# Patient Record
Sex: Male | Born: 1975
Health system: Southern US, Community
[De-identification: ages and names within clinical notes are randomized; demographics above are authoritative.]

## PROBLEM LIST (undated history)

## (undated) ENCOUNTER — Emergency Department (HOSPITAL_COMMUNITY): Payer: Medicare HMO

## (undated) ENCOUNTER — Emergency Department (HOSPITAL_COMMUNITY): Discharge status: Absent without leave | Payer: 59

## (undated) DIAGNOSIS — J449 Chronic obstructive pulmonary disease, unspecified: Secondary | ICD-10-CM

## (undated) DIAGNOSIS — K259 Gastric ulcer, unspecified as acute or chronic, without hemorrhage or perforation: Secondary | ICD-10-CM

## (undated) DIAGNOSIS — G4730 Sleep apnea, unspecified: Secondary | ICD-10-CM

## (undated) DIAGNOSIS — D649 Anemia, unspecified: Secondary | ICD-10-CM

## (undated) DIAGNOSIS — E669 Obesity, unspecified: Secondary | ICD-10-CM

## (undated) DIAGNOSIS — L0231 Cutaneous abscess of buttock: Secondary | ICD-10-CM

## (undated) DIAGNOSIS — U071 COVID-19: Secondary | ICD-10-CM

## (undated) DIAGNOSIS — R000 Tachycardia, unspecified: Secondary | ICD-10-CM

## (undated) DIAGNOSIS — F419 Anxiety disorder, unspecified: Secondary | ICD-10-CM

## (undated) DIAGNOSIS — K219 Gastro-esophageal reflux disease without esophagitis: Secondary | ICD-10-CM

## (undated) DIAGNOSIS — M2550 Pain in unspecified joint: Secondary | ICD-10-CM

## (undated) DIAGNOSIS — E119 Type 2 diabetes mellitus without complications: Secondary | ICD-10-CM

## (undated) DIAGNOSIS — I10 Essential (primary) hypertension: Secondary | ICD-10-CM

## (undated) DIAGNOSIS — R519 Headache, unspecified: Secondary | ICD-10-CM

## (undated) DIAGNOSIS — Z9981 Dependence on supplemental oxygen: Secondary | ICD-10-CM

## (undated) DIAGNOSIS — I712 Thoracic aortic aneurysm, without rupture, unspecified: Secondary | ICD-10-CM

## (undated) DIAGNOSIS — R531 Weakness: Secondary | ICD-10-CM

## (undated) DIAGNOSIS — R5383 Other fatigue: Secondary | ICD-10-CM

## (undated) DIAGNOSIS — E7800 Pure hypercholesterolemia, unspecified: Secondary | ICD-10-CM

## (undated) DIAGNOSIS — E538 Deficiency of other specified B group vitamins: Secondary | ICD-10-CM

## (undated) DIAGNOSIS — R0602 Shortness of breath: Secondary | ICD-10-CM

## (undated) DIAGNOSIS — R450 Nervousness: Secondary | ICD-10-CM

## (undated) DIAGNOSIS — M549 Dorsalgia, unspecified: Secondary | ICD-10-CM

## (undated) DIAGNOSIS — R609 Edema, unspecified: Secondary | ICD-10-CM

## (undated) DIAGNOSIS — L905 Scar conditions and fibrosis of skin: Secondary | ICD-10-CM

## (undated) DIAGNOSIS — F32A Depression, unspecified: Secondary | ICD-10-CM

## (undated) DIAGNOSIS — R350 Frequency of micturition: Secondary | ICD-10-CM

## (undated) DIAGNOSIS — R0902 Hypoxemia: Secondary | ICD-10-CM

## (undated) DIAGNOSIS — M791 Myalgia, unspecified site: Secondary | ICD-10-CM

## (undated) HISTORY — DX: Anxiety disorder, unspecified: F41.9

## (undated) HISTORY — DX: Sleep apnea, unspecified: G47.30

## (undated) HISTORY — DX: Dorsalgia, unspecified: M54.9

## (undated) HISTORY — DX: Gastro-esophageal reflux disease without esophagitis: K21.9

## (undated) HISTORY — DX: Thoracic aortic aneurysm, without rupture, unspecified: I71.20

## (undated) HISTORY — DX: Tachycardia, unspecified: R00.0

## (undated) HISTORY — DX: Pure hypercholesterolemia, unspecified: E78.00

## (undated) HISTORY — DX: Cutaneous abscess of buttock: L02.31

## (undated) HISTORY — DX: Edema, unspecified: R60.9

## (undated) HISTORY — DX: Frequency of micturition: R35.0

## (undated) HISTORY — DX: Headache, unspecified: R51.9

## (undated) HISTORY — DX: Type 2 diabetes mellitus without complications: E11.9

## (undated) HISTORY — DX: COVID-19: U07.1

## (undated) HISTORY — DX: Other fatigue: R53.83

## (undated) HISTORY — DX: Depression, unspecified: F32.A

## (undated) HISTORY — PX: OTHER SURGICAL HISTORY: SHX169

## (undated) HISTORY — DX: Shortness of breath: R06.02

## (undated) HISTORY — DX: Essential (primary) hypertension: I10

## (undated) HISTORY — DX: Anemia, unspecified: D64.9

## (undated) HISTORY — DX: Thoracic aortic aneurysm, without rupture: I71.2

## (undated) HISTORY — DX: Pain in unspecified joint: M25.50

## (undated) HISTORY — DX: Hypoxemia: R09.02

## (undated) HISTORY — DX: Obesity, unspecified: E66.9

## (undated) HISTORY — DX: Myalgia, unspecified site: M79.10

## (undated) HISTORY — DX: Gastric ulcer, unspecified as acute or chronic, without hemorrhage or perforation: K25.9

## (undated) HISTORY — DX: Nervousness: R45.0

## (undated) HISTORY — DX: Weakness: R53.1

## (undated) HISTORY — DX: Deficiency of other specified B group vitamins: E53.8

---

## 2001-10-20 ENCOUNTER — Emergency Department (HOSPITAL_COMMUNITY): Admission: EM | Admit: 2001-10-20 | Discharge: 2001-10-20 | Payer: Self-pay | Admitting: *Deleted

## 2007-06-04 ENCOUNTER — Ambulatory Visit (HOSPITAL_COMMUNITY): Admission: RE | Admit: 2007-06-04 | Discharge: 2007-06-04 | Payer: Self-pay | Admitting: Internal Medicine

## 2010-06-08 NOTE — Consult Note (Signed)
NAME:  Ricardo Lopez, Ricardo Lopez                          ACCOUNT NO.:  [id number]   MEDICAL RECORD NO.:  [id number]                   PATIENT TYPE:  EMS   LOCATION:  MINO                                 FACILITY:  [facility]   PHYSICIAN:  Dionne Ano. Everlene Other, M.D.         DATE OF BIRTH:  06-Apr-1975   DATE OF CONSULTATION:  10/20/2001  DATE OF DISCHARGE:                                   CONSULTATION   The patient was seen in the emergency room at [facility]  I was  asked to consult in regards to his care.  I was asked to see him by Dr.  Josie Saunders.  This patient is a 35 year old male who was at work at [employer] when a heavy object fell on his left index and middle finger.  The  patient sustained a fracture about the left middle finger distal phalanx  with a nail bed laceration and subungual hematoma.  The index finger has  similar contusion without fracture and devitalized skin tissue.  The  patient's tetanus status has been addressed.  He is alert and oriented.  He  is afebrile and vital signs are stable.  The patient notes no opposite right  upper extremity pain.  He denies any shoulder or elbow pain.  He is awake,  alert and oriented.   PAST MEDICAL HISTORY:  Bilateral carpal tunnel syndrome.   PAST SURGICAL HISTORY:  Wisdom teeth surgery.   ALLERGIES:  Aspirin causes swelling.   MEDICATIONS:  A nerve pill as well as an anti-inflammatory.   SOCIAL HISTORY:  He does not smoke or drink.  He is single.   PHYSICAL EXAMINATION:  Reveals a very pleasant, black male in no acute  distress.  The patient has normal refill to the fingertips however there is  a large bit of contusion to the index and middle finger.  He has subungual  hematomas about the index and middle fingers.  There is a large area of  devitalized skin tissue over the index finger volar surface.  The PIP and  MCP joints are stable.  He has normal pulses.  Elbow and shoulder  examination is benign.  The right  upper extremity is atraumatic and  neurovascularly intact.  Normal stability and motor function.  The chest is  clear.  The neck is nontender.   X-rays were reviewed which showed a middle finger distal phalanx fracture  with mild displacement.   IMPRESSION:  1. Open distal phalanx fracture, left middle finger, with a nail bed     laceration and subungual hematoma.  2. Contusion, left index finger, with devitalized skin tissue and subungual     hematoma.   PLAN:  I verbally consented for repair of the structures as necessary.   PROCEDURE NOTE:  The patient was taken to the procedural area.  He underwent  lidocaine block about the intermetacarpal region without difficulty.  Following this prep  and drape in the usual sterile fashion with Betadine  scrub and paint.  Once this was done the patient atraumatically had the nail  removed from the middle finger.  Subungual hematoma addressed.  The nail  plate removal was followed by I&D of the bone.  This was I&D of the skin and  subcutaneous tissue, bone and nail bed tissue.  This was an excisional  debridement accomplished without difficulty.  Following this the fracture  was reapproximated and thus open treatment for a distal phalanx fracture was  accomplished.  Once this was done, the patient then underwent nail bed  repair.   Nail bed repair was accomplished with chromic suture without difficulty.  The patient tolerated this well.  There were no complications.  Once this  was done, the patient underwent I&D of the left index finger.  This is a 2-  cm skin I&D.  This included the skin and portions of the subcutaneous  tissue.  Following I&D, the patient underwent decompression of the subungual  hematoma about the nail (partial nail plate removal).  Once this was done,  the areas were all irrigated copiously.  Dressings were placed and the  patient was splinted appropriately.  He was discharged home on Keflex,  Percocet and Robaxin.  He  will return to work in two to three days with  restrictions of sit down work only.  No use of the affected upper extremity  (left upper extremity).  I have discussed ___________ and all questions have  been encouraged and answered.   This patient thus underwent I&D of open fracture about the left middle  finger with nail plate removal, nail bed repair and open treatment of the  fracture.  The index finger underwent I&D and decompression of the subungual  hematoma.  All questions have been encouraged and answered.                                               Dionne Ano. Everlene Other, M.D.    Nash Mantis  D:  10/20/2001  T:  10/21/2001  Job:  [id number]

## 2012-02-04 ENCOUNTER — Ambulatory Visit
Admission: RE | Admit: 2012-02-04 | Discharge: 2012-02-04 | Disposition: A | Discharge status: Home / Self Care | Payer: BC Managed Care – PPO | Source: Ambulatory Visit | Attending: Family Medicine | Admitting: Family Medicine

## 2012-02-04 ENCOUNTER — Other Ambulatory Visit: Payer: Self-pay | Admitting: Family Medicine

## 2012-02-04 DIAGNOSIS — G8929 Other chronic pain: Secondary | ICD-10-CM

## 2012-05-04 ENCOUNTER — Other Ambulatory Visit: Payer: Self-pay | Admitting: Family Medicine

## 2012-05-04 ENCOUNTER — Ambulatory Visit (INDEPENDENT_AMBULATORY_CARE_PROVIDER_SITE_OTHER): Payer: BC Managed Care – PPO | Admitting: Family Medicine

## 2012-05-04 ENCOUNTER — Encounter: Payer: Self-pay | Admitting: Family Medicine

## 2012-05-04 VITALS — BP 140/88 | HR 100 | Temp 98.8°F | Resp 20

## 2012-05-04 DIAGNOSIS — E669 Obesity, unspecified: Secondary | ICD-10-CM | POA: Insufficient documentation

## 2012-05-04 DIAGNOSIS — M25569 Pain in unspecified knee: Secondary | ICD-10-CM

## 2012-05-04 DIAGNOSIS — M25579 Pain in unspecified ankle and joints of unspecified foot: Secondary | ICD-10-CM

## 2012-05-04 DIAGNOSIS — M25561 Pain in right knee: Secondary | ICD-10-CM

## 2012-05-04 DIAGNOSIS — I10 Essential (primary) hypertension: Secondary | ICD-10-CM | POA: Insufficient documentation

## 2012-05-04 DIAGNOSIS — M25571 Pain in right ankle and joints of right foot: Secondary | ICD-10-CM

## 2012-05-04 MED ORDER — TRAMADOL HCL 50 MG PO TABS: 50.0000 mg | ORAL_TABLET | Freq: Three times a day (TID) | ORAL | Status: DC | PRN

## 2012-05-04 NOTE — Addendum Note (Signed)
Addended by: Lynnea Ferrier T on: 05/04/2012 04:18 PM   Modules accepted: Orders
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
## 2012-05-12 ENCOUNTER — Ambulatory Visit: Payer: Self-pay | Admitting: Family Medicine

## 2012-05-26 ENCOUNTER — Encounter: Payer: Self-pay | Admitting: Orthopedic Surgery

## 2012-05-26 ENCOUNTER — Ambulatory Visit (INDEPENDENT_AMBULATORY_CARE_PROVIDER_SITE_OTHER): Payer: BC Managed Care – PPO

## 2012-05-26 ENCOUNTER — Ambulatory Visit (INDEPENDENT_AMBULATORY_CARE_PROVIDER_SITE_OTHER): Payer: BC Managed Care – PPO | Admitting: Orthopedic Surgery

## 2012-05-26 ENCOUNTER — Other Ambulatory Visit: Payer: Self-pay | Admitting: Orthopedic Surgery

## 2012-05-26 ENCOUNTER — Ambulatory Visit: Payer: BC Managed Care – PPO

## 2012-05-26 VITALS — BP 150/94 | Ht 68.0 in | Wt 390.0 lb

## 2012-05-26 DIAGNOSIS — M19079 Primary osteoarthritis, unspecified ankle and foot: Secondary | ICD-10-CM

## 2012-05-26 DIAGNOSIS — M958 Other specified acquired deformities of musculoskeletal system: Secondary | ICD-10-CM

## 2012-05-26 DIAGNOSIS — M959 Acquired deformity of musculoskeletal system, unspecified: Secondary | ICD-10-CM

## 2012-05-26 DIAGNOSIS — M23329 Other meniscus derangements, posterior horn of medial meniscus, unspecified knee: Secondary | ICD-10-CM | POA: Insufficient documentation

## 2012-05-26 DIAGNOSIS — M23321 Other meniscus derangements, posterior horn of medial meniscus, right knee: Secondary | ICD-10-CM

## 2012-05-26 DIAGNOSIS — M25569 Pain in unspecified knee: Secondary | ICD-10-CM

## 2012-05-26 DIAGNOSIS — M25561 Pain in right knee: Secondary | ICD-10-CM
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
## 2012-05-26 NOTE — Patient Instructions (Addendum)
MRI right knee  MRI right ankle  You are allergic to aspirin so anti-inflammatories are probably not the best drug for you. Ultracet/tramadol his only medication that he can take at this point  We will see you after MRI something completed to reevaluate her situation

## 2012-06-03 ENCOUNTER — Telehealth: Payer: Self-pay | Admitting: *Deleted

## 2012-06-03 NOTE — Telephone Encounter (Signed)
No pre-certification required for MRI of right ankle and right knee per Jocelyn R with [employer]. Patient was scheduled for MRI for 06/05/12 at 9:00am. Patient aware.

## 2012-06-05 ENCOUNTER — Ambulatory Visit (HOSPITAL_COMMUNITY): Payer: BC Managed Care – PPO | Attending: Orthopedic Surgery

## 2012-06-05 ENCOUNTER — Ambulatory Visit (HOSPITAL_COMMUNITY): Admission: RE | Admit: 2012-06-05 | Payer: BC Managed Care – PPO | Source: Ambulatory Visit

## 2012-06-13 ENCOUNTER — Emergency Department (HOSPITAL_COMMUNITY)
Admission: EM | Admit: 2012-06-13 | Discharge: 2012-06-13 | Disposition: A | Discharge status: Home / Self Care | Payer: BC Managed Care – PPO | Attending: Emergency Medicine | Admitting: Emergency Medicine

## 2012-06-13 DIAGNOSIS — Z87828 Personal history of other (healed) physical injury and trauma: Secondary | ICD-10-CM | POA: Insufficient documentation

## 2012-06-13 DIAGNOSIS — G8929 Other chronic pain: Secondary | ICD-10-CM | POA: Insufficient documentation

## 2012-06-13 DIAGNOSIS — E669 Obesity, unspecified: Secondary | ICD-10-CM | POA: Insufficient documentation

## 2012-06-13 DIAGNOSIS — I10 Essential (primary) hypertension: Secondary | ICD-10-CM | POA: Insufficient documentation

## 2012-06-13 DIAGNOSIS — M25569 Pain in unspecified knee: Secondary | ICD-10-CM | POA: Insufficient documentation

## 2012-06-13 DIAGNOSIS — M25579 Pain in unspecified ankle and joints of unspecified foot: Secondary | ICD-10-CM | POA: Insufficient documentation

## 2012-06-13 MED ORDER — OXYCODONE-ACETAMINOPHEN 5-325 MG PO TABS: 2.0000 | ORAL_TABLET | ORAL | Status: DC | PRN
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
## 2012-06-23 ENCOUNTER — Ambulatory Visit: Payer: BC Managed Care – PPO | Admitting: Orthopedic Surgery

## 2012-06-23 ENCOUNTER — Ambulatory Visit (HOSPITAL_COMMUNITY)
Admission: RE | Admit: 2012-06-23 | Discharge: 2012-06-23 | Disposition: A | Discharge status: Home / Self Care | Payer: BC Managed Care – PPO | Source: Ambulatory Visit | Attending: Orthopedic Surgery | Admitting: Orthopedic Surgery

## 2012-06-23 DIAGNOSIS — X58XXXA Exposure to other specified factors, initial encounter: Secondary | ICD-10-CM | POA: Insufficient documentation

## 2012-06-23 DIAGNOSIS — M958 Other specified acquired deformities of musculoskeletal system: Secondary | ICD-10-CM

## 2012-06-23 DIAGNOSIS — M23321 Other meniscus derangements, posterior horn of medial meniscus, right knee: Secondary | ICD-10-CM

## 2012-06-23 DIAGNOSIS — IMO0002 Reserved for concepts with insufficient information to code with codable children: Secondary | ICD-10-CM | POA: Insufficient documentation

## 2012-06-23 DIAGNOSIS — M25469 Effusion, unspecified knee: Secondary | ICD-10-CM | POA: Insufficient documentation

## 2012-06-23 DIAGNOSIS — M898X9 Other specified disorders of bone, unspecified site: Secondary | ICD-10-CM | POA: Insufficient documentation

## 2012-06-23 DIAGNOSIS — M25569 Pain in unspecified knee: Secondary | ICD-10-CM | POA: Insufficient documentation

## 2012-06-23 DIAGNOSIS — M8560 Other cyst of bone, unspecified site: Secondary | ICD-10-CM | POA: Insufficient documentation

## 2012-06-23 DIAGNOSIS — M7730 Calcaneal spur, unspecified foot: Secondary | ICD-10-CM | POA: Insufficient documentation

## 2012-06-23 DIAGNOSIS — M25579 Pain in unspecified ankle and joints of unspecified foot: Secondary | ICD-10-CM | POA: Insufficient documentation

## 2012-07-14 ENCOUNTER — Ambulatory Visit (INDEPENDENT_AMBULATORY_CARE_PROVIDER_SITE_OTHER): Payer: BC Managed Care – PPO | Admitting: Orthopedic Surgery

## 2012-07-14 VITALS — BP 162/86 | Ht 68.0 in | Wt 390.0 lb

## 2012-07-14 DIAGNOSIS — M19071 Primary osteoarthritis, right ankle and foot: Secondary | ICD-10-CM

## 2012-07-14 DIAGNOSIS — M19079 Primary osteoarthritis, unspecified ankle and foot: Secondary | ICD-10-CM

## 2012-07-14 MED ORDER — HYDROCODONE-ACETAMINOPHEN 7.5-325 MG PO TABS: 1.0000 | ORAL_TABLET | Freq: Three times a day (TID) | ORAL | Status: DC | PRN

## 2012-07-14 NOTE — Progress Notes (Signed)
Patient ID: Ricardo Park., male   DOB: October 16, 1975, 37 y.o.   MRN: [id number] . Chief Complaint  Patient presents with  . Results    MRI results right knee and ankle    2 MRIs to review one for chronic right ankle pain and 14 more a subacute right knee pain. The ankles bothering the patient will more he's not responsive to tramadol or NSAIDs he has a subchondral cyst in the lateral part of the talar dome which looks degenerative otherwise ankle looks normal he is very obese and I think this is compounding his problem  His right knee has a displaced bucket-handle tear the meniscus he does not want to have surgery. He has some chondromalacia patella as well. The tibiofemoral articulations look to be normal from an arthritis standpoint except for slight thinning of the articular cartilage of the medial compartment  We talked and we decided to put him on hydrocodone 7.53 times a day says that helps the Percocet helps as well. But obviously I told him he can't take that for ever  He also has chronic edema in both legs most likely secondary to venous stasis disease  Diagnosis osteoarthritis right ankle Bucket-handle tear medial meniscus right knee Chronic venous stasis disease  Both MRIs were reviewed with their reports and a prescription was given for Norco and a 3 month followup

## 2012-07-14 NOTE — Patient Instructions (Addendum)
Take norco (hydrocodone) 3 times a day   You have ankle arthritis and a torn medial meniscus. We would recommend meniscectomy with arthroscopic surgery which would require 4-6 weeks for recovery  As far as her ankle arthritis goes that is chronic will probably worsen over time. You can take pain medication as prescribed.

## 2012-10-15 ENCOUNTER — Ambulatory Visit (INDEPENDENT_AMBULATORY_CARE_PROVIDER_SITE_OTHER): Payer: BC Managed Care – PPO | Admitting: Orthopedic Surgery

## 2012-10-15 ENCOUNTER — Encounter: Payer: Self-pay | Admitting: Orthopedic Surgery

## 2012-10-15 VITALS — BP 151/91 | Ht 68.0 in | Wt >= 6400 oz

## 2012-10-15 DIAGNOSIS — M19079 Primary osteoarthritis, unspecified ankle and foot: Secondary | ICD-10-CM

## 2012-10-15 DIAGNOSIS — G894 Chronic pain syndrome: Secondary | ICD-10-CM | POA: Insufficient documentation

## 2012-10-15 DIAGNOSIS — M23329 Other meniscus derangements, posterior horn of medial meniscus, unspecified knee: Secondary | ICD-10-CM

## 2012-10-15 MED ORDER — OXYCODONE-ACETAMINOPHEN 10-325 MG PO TABS: 1.0000 | ORAL_TABLET | ORAL | Status: DC | PRN

## 2012-10-15 NOTE — Patient Instructions (Addendum)
Make a referral to Pain management   capzasin cream

## 2012-10-15 NOTE — Progress Notes (Signed)
Patient ID: Ricardo Park., male   DOB: Mar 05, 1975, 37 y.o.   MRN: [id number]  Chief Complaint  Patient presents with  . Follow-up    3 month recheck right ankle and right knee    This is a patient with chronic right ankle pain else has a torn meniscus in his right knee his ankle pain is bilateral and he has severe ankle pain requiring opioids 10 mg of Percocet to relieve his pain he is failed Norco. He will be chronic pain management. He does not want to see a foot and ankle specialist at this time am not sure what else they can offer him because of his weight age and size.  Recommend chronic pain management  Percocet 10 mg 325 of Tylenol #84 no further refills.  Time spent counseling 15 minutes

## 2012-10-16 ENCOUNTER — Telehealth: Payer: Self-pay | Admitting: *Deleted

## 2012-10-16 ENCOUNTER — Other Ambulatory Visit: Payer: Self-pay | Admitting: *Deleted

## 2012-10-16 DIAGNOSIS — G894 Chronic pain syndrome: Secondary | ICD-10-CM

## 2012-10-16 NOTE — Telephone Encounter (Signed)
Referral and office notes faxed to Dr. Gerilyn Pilgrim for chronic pain management. Awaiting appointment.

## 2012-10-28 NOTE — Telephone Encounter (Signed)
Appointment with Dr. Gerilyn Pilgrim 11/13/12 at 2:00 pm.

## 2013-01-07 ENCOUNTER — Other Ambulatory Visit (HOSPITAL_COMMUNITY): Payer: Self-pay

## 2013-01-07 DIAGNOSIS — G4730 Sleep apnea, unspecified: Secondary | ICD-10-CM

## 2013-01-18 ENCOUNTER — Ambulatory Visit: Payer: Medicaid Other | Attending: Neurology | Admitting: Sleep Medicine

## 2013-01-18 DIAGNOSIS — G4730 Sleep apnea, unspecified: Secondary | ICD-10-CM

## 2013-01-18 DIAGNOSIS — E669 Obesity, unspecified: Secondary | ICD-10-CM | POA: Insufficient documentation

## 2013-01-18 DIAGNOSIS — Z6841 Body Mass Index (BMI) 40.0 and over, adult: Secondary | ICD-10-CM | POA: Insufficient documentation

## 2013-01-18 DIAGNOSIS — G4733 Obstructive sleep apnea (adult) (pediatric): Secondary | ICD-10-CM | POA: Insufficient documentation

## 2013-01-30 NOTE — Procedures (Signed)
[facility] Maxemiliano Riel A. Gerilyn Pilgrimoonquah, MD     [email or web address]        NAME:  Ricardo Lopez, Ricardo Lopez                ACCOUNT NO.:  [id number]  MEDICAL RECORD NO.:  [id number]          PATIENT TYPE:  OUT  LOCATION:  SLEEP LAB                     FACILITY:  [facility]  PHYSICIAN:  Gladine Plude A. Gerilyn Pilgrimoonquah, M.D. DATE OF BIRTH:  10-08-1975  DATE OF STUDY:  01/18/2013                           NOCTURNAL POLYSOMNOGRAM  REFERRING PHYSICIAN:  Tyneka Scafidi A. Gerilyn Pilgrimoonquah, M.D.  INDICATION:  A 38 year old who presents with hypersomnia, obesity, and snoring.   MEDICATIONS:  Topamax, Mobic.  EPWORTH SLEEPINESS SCALE:  10.  BMI:  59.  ARCHITECTURAL SUMMARY:  The total recording time is 403 minutes.  Sleep efficiency 86%.  Sleep latency 43 minutes.  REM latency 94 minutes. Stage N1 4%, N2 61%, N3 8%, and REM sleep 26%.  RESPIRATORY SUMMARY:  Baseline oxygen saturation is 96, lowest saturation 78 during REM sleep.  Diagnostic AHI is 32 and RDI 32.  The patient had more events during REM sleep with the REM AHI being 70.  LIMB MOVEMENT SUMMARY:  PLM index 0.  ELECTROCARDIOGRAM SUMMARY:  Average heart rate is 85 with no significant arrhythmias observed.  IMPRESSION:  Moderate obstructive sleep apnea syndrome.  RECOMMENDATION:  Formal CPAP titration study.     Sui Kasparek A. Gerilyn Pilgrimoonquah, M.D.    KAD/MEDQ  D:  01/30/2013 11:03:39  T:  01/30/2013 11:18:41  Job:  [id number]

## 2013-02-25 ENCOUNTER — Other Ambulatory Visit (HOSPITAL_COMMUNITY): Payer: Self-pay

## 2013-02-25 DIAGNOSIS — G4730 Sleep apnea, unspecified: Secondary | ICD-10-CM

## 2013-02-28 ENCOUNTER — Ambulatory Visit: Payer: Medicaid Other

## 2015-02-28 ENCOUNTER — Encounter: Payer: Self-pay | Admitting: Family Medicine

## 2015-02-28 ENCOUNTER — Ambulatory Visit (INDEPENDENT_AMBULATORY_CARE_PROVIDER_SITE_OTHER): Payer: BLUE CROSS/BLUE SHIELD | Admitting: Family Medicine

## 2015-02-28 ENCOUNTER — Other Ambulatory Visit: Payer: Self-pay | Admitting: Family Medicine

## 2015-02-28 DIAGNOSIS — M130 Polyarthritis, unspecified: Secondary | ICD-10-CM | POA: Diagnosis not present

## 2015-02-28 DIAGNOSIS — G4733 Obstructive sleep apnea (adult) (pediatric): Secondary | ICD-10-CM

## 2015-02-28 DIAGNOSIS — M25571 Pain in right ankle and joints of right foot: Secondary | ICD-10-CM | POA: Diagnosis not present

## 2015-02-28 DIAGNOSIS — M25572 Pain in left ankle and joints of left foot: Secondary | ICD-10-CM
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
## 2015-03-01 LAB — CBC WITH DIFFERENTIAL/PLATELET
BASOS ABS: 0 10*3/uL (ref 0.0–0.1)
BASOS PCT: 0 % (ref 0–1)
Eosinophils Absolute: 0.1 10*3/uL (ref 0.0–0.7)
Eosinophils Relative: 1 % (ref 0–5)
HEMATOCRIT: 34.1 % — AB (ref 39.0–52.0)
HEMOGLOBIN: 10.4 g/dL — AB (ref 13.0–17.0)
LYMPHS PCT: 20 % (ref 12–46)
Lymphs Abs: 2.2 10*3/uL (ref 0.7–4.0)
MCH: 20.2 pg — ABNORMAL LOW (ref 26.0–34.0)
MCHC: 30.5 g/dL (ref 30.0–36.0)
MCV: 66.2 fL — ABNORMAL LOW (ref 78.0–100.0)
MONO ABS: 1 10*3/uL (ref 0.1–1.0)
Monocytes Relative: 9 % (ref 3–12)
NEUTROS ABS: 7.7 10*3/uL (ref 1.7–7.7)
Neutrophils Relative %: 70 % (ref 43–77)
Platelets: 302 10*3/uL (ref 150–400)
RBC: 5.15 MIL/uL (ref 4.22–5.81)
RDW: 16.4 % — AB (ref 11.5–15.5)
WBC: 11 10*3/uL — AB (ref 4.0–10.5)

## 2015-03-01 LAB — COMPLETE METABOLIC PANEL WITH GFR
ALBUMIN: 4 g/dL (ref 3.6–5.1)
ALT: 14 U/L (ref 9–46)
AST: 15 U/L (ref 10–40)
Alkaline Phosphatase: 66 U/L (ref 40–115)
BUN: 11 mg/dL (ref 7–25)
CHLORIDE: 102 mmol/L (ref 98–110)
CO2: 29 mmol/L (ref 20–31)
CREATININE: 1.3 mg/dL (ref 0.60–1.35)
Calcium: 9.6 mg/dL (ref 8.6–10.3)
GFR, Est African American: 79 mL/min (ref >=60)
GFR, Est Non African American: 69 mL/min (ref >=60)
GLUCOSE: 79 mg/dL (ref 70–99)
POTASSIUM: 4.7 mmol/L (ref 3.5–5.3)
SODIUM: 139 mmol/L (ref 135–146)
Total Bilirubin: 0.3 mg/dL (ref 0.2–1.2)
Total Protein: 7.2 g/dL (ref 6.1–8.1)

## 2015-03-01 LAB — RHEUMATOID FACTOR: Rhuematoid fact SerPl-aCnc: <10 IU/mL (ref <=14)

## 2015-03-01 LAB — SEDIMENTATION RATE: SED RATE: 34 mm/h — AB (ref 0–15)

## 2015-03-01 LAB — HEMOGLOBIN A1C
Hgb A1c MFr Bld: 6.2 % — ABNORMAL HIGH (ref <5.7)
Mean Plasma Glucose: 131 mg/dL — ABNORMAL HIGH (ref <117)

## 2015-03-02 ENCOUNTER — Telehealth: Payer: Self-pay | Admitting: Family Medicine

## 2015-03-02 NOTE — Telephone Encounter (Signed)
Patient calling about lab results  [phone]

## 2015-03-03 ENCOUNTER — Other Ambulatory Visit: Payer: Self-pay | Admitting: Family Medicine

## 2015-03-03 DIAGNOSIS — D508 Other iron deficiency anemias: Secondary | ICD-10-CM

## 2015-03-03 LAB — IRON: IRON: 37 ug/dL — AB (ref 50–180)

## 2015-03-03 LAB — B12 AND FOLATE PANEL
Folate: 2.9 ng/mL — ABNORMAL LOW (ref >5.4)
VITAMIN B 12: 1186 pg/mL — AB (ref 200–1100)

## 2015-03-03 NOTE — Telephone Encounter (Signed)
Patient aware of results and recommendations. °

## 2015-03-09 ENCOUNTER — Ambulatory Visit (INDEPENDENT_AMBULATORY_CARE_PROVIDER_SITE_OTHER): Payer: BLUE CROSS/BLUE SHIELD | Admitting: Physician Assistant

## 2015-03-09 ENCOUNTER — Encounter: Payer: Self-pay | Admitting: Physician Assistant

## 2015-03-09 VITALS — BP 120/80 | HR 78 | Temp 98.9°F | Resp 20

## 2015-03-09 DIAGNOSIS — Z202 Contact with and (suspected) exposure to infections with a predominantly sexual mode of transmission: Secondary | ICD-10-CM | POA: Diagnosis not present

## 2015-03-09 MED ORDER — METRONIDAZOLE 500 MG PO TABS: ORAL_TABLET | ORAL | Status: DC

## 2015-03-09 NOTE — Progress Notes (Signed)
    Patient ID: Ricardo Lopez. MRN: [id number], DOB: [birth year], 40 y.o. Date of Encounter: 03/09/2015, 4:49 PM    Chief Complaint:  Chief Complaint  Patient presents with  . OTHER    needs antibiotic for infection for TRICH/ STD     HPI: 40 y.o. year old AA male presents for above.  He states that his girlfriend went to her gynecologist yesterday and was positive for trichomonas. They told her to inform him and have him see his doctor for treatment.  Patient states that he has had no penile discharge. Says that he has had some dysuria for about one week. No hematuria. No fevers or chills. No other complaints or concerns.     Home Meds:   Outpatient Prescriptions Prior to Visit  Medication Sig Dispense Refill  . HYDROcodone-acetaminophen (NORCO) 7.5-325 MG per tablet Take 1 tablet by mouth every 8 (eight) hours as needed for pain. 90 tablet 2  . Ibuprofen-Diphenhydramine HCl (ADVIL PM) 200-25 MG CAPS Take by mouth. Reported on 02/28/2015    . naproxen sodium (ANAPROX) 220 MG tablet Take 220 mg by mouth 2 (two) times daily with a meal. Reported on 02/28/2015    . oxyCODONE-acetaminophen (PERCOCET) 10-325 MG per tablet Take 1 tablet by mouth every 4 (four) hours as needed for pain. 84 tablet 0  . traMADol (ULTRAM) 50 MG tablet Take 1 tablet (50 mg total) by mouth every 8 (eight) hours as needed for pain. 60 tablet 0   No facility-administered medications prior to visit.    Allergies:  Allergies  Allergen Reactions  . Asa [Aspirin] Swelling      Review of Systems: See HPI for pertinent ROS. All other ROS negative.    Physical Exam: Blood pressure 120/80, pulse 78, temperature 98.9 F (37.2 C), temperature source Oral, resp. rate 20., There is no weight on file to calculate BMI. General:  Obese AAM. Appears in no acute distress. Neck: Supple. No thyromegaly. No lymphadenopathy. Lungs: Clear bilaterally to auscultation without wheezes, rales, or rhonchi. Breathing is  unlabored. Heart: Regular rhythm. No murmurs, rubs, or gallops. Msk:  Strength and tone normal for age. Extremities/Skin: Warm and dry.  Neuro: Alert and oriented X 3. Moves all extremities spontaneously. Gait is normal. CNII-XII grossly in tact. Psych:  Responds to questions appropriately with a normal affect.     ASSESSMENT AND PLAN:  40 y.o. year old male with  1. Exposure to trichomonas Recommended to him that he have STD screening to include other STDs. Discussed that he could even do self-swab for Gonorrhea/Chlamydia testing. So discussed blood work to check for other STDs screening. He defers.  Says that he just wants the treatment for this. When I discussed that other infections could cause problems to females, he says that his girlfriend was just checked by the gynecologist yesterday and knows that she doesn't have any other infections.  Told him that this is going to be a high dose of medication---Told him to eat something like some crackers so that he has something on his stomach but bland food-- when he takes this. F/U if needed.  - metroNIDAZOLE (FLAGYL) 500 MG tablet; Take 4 tablets--one time dose  Dispense: 4 tablet; Refill: 0   Signed, [address] [place], [place], [facility] 03/09/2015 4:49 PM

## 2015-05-02 ENCOUNTER — Encounter (HOSPITAL_BASED_OUTPATIENT_CLINIC_OR_DEPARTMENT_OTHER): Payer: Medicaid Other

## 2015-05-11 ENCOUNTER — Ambulatory Visit (HOSPITAL_BASED_OUTPATIENT_CLINIC_OR_DEPARTMENT_OTHER): Payer: Medicaid Other

## 2015-06-07 ENCOUNTER — Ambulatory Visit (HOSPITAL_BASED_OUTPATIENT_CLINIC_OR_DEPARTMENT_OTHER): Payer: Medicaid Other

## 2017-10-20 ENCOUNTER — Other Ambulatory Visit: Payer: Self-pay

## 2017-10-20 ENCOUNTER — Emergency Department (HOSPITAL_COMMUNITY): Payer: Self-pay

## 2017-10-20 ENCOUNTER — Emergency Department (HOSPITAL_COMMUNITY)
Admission: EM | Admit: 2017-10-20 | Discharge: 2017-10-20 | Disposition: A | Discharge status: Home / Self Care | Payer: Self-pay | Attending: Emergency Medicine | Admitting: Emergency Medicine

## 2017-10-20 ENCOUNTER — Encounter (HOSPITAL_COMMUNITY): Payer: Self-pay | Admitting: Emergency Medicine

## 2017-10-20 DIAGNOSIS — I10 Essential (primary) hypertension: Secondary | ICD-10-CM | POA: Insufficient documentation

## 2017-10-20 DIAGNOSIS — L03115 Cellulitis of right lower limb: Secondary | ICD-10-CM | POA: Insufficient documentation

## 2017-10-20 DIAGNOSIS — Z79899 Other long term (current) drug therapy: Secondary | ICD-10-CM | POA: Insufficient documentation

## 2017-10-20 LAB — CBC WITH DIFFERENTIAL/PLATELET
BASOS PCT: 0 %
Basophils Absolute: 0 10*3/uL (ref 0.0–0.1)
EOS ABS: 0.2 10*3/uL (ref 0.0–0.7)
EOS PCT: 2 %
HCT: 31.5 % — ABNORMAL LOW (ref 39.0–52.0)
HEMOGLOBIN: 9.4 g/dL — AB (ref 13.0–17.0)
LYMPHS ABS: 1.6 10*3/uL (ref 0.7–4.0)
Lymphocytes Relative: 17 %
MCH: 20.3 pg — AB (ref 26.0–34.0)
MCHC: 29.8 g/dL — AB (ref 30.0–36.0)
MCV: 68.2 fL — ABNORMAL LOW (ref 78.0–100.0)
MONO ABS: 0.5 10*3/uL (ref 0.1–1.0)
Monocytes Relative: 5 %
Neutro Abs: 7.2 10*3/uL (ref 1.7–7.7)
Neutrophils Relative %: 76 %
Platelets: 321 10*3/uL (ref 150–400)
RBC: 4.62 MIL/uL (ref 4.22–5.81)
RDW: 16.3 % — AB (ref 11.5–15.5)
WBC: 9.4 10*3/uL (ref 4.0–10.5)

## 2017-10-20 LAB — I-STAT CG4 LACTIC ACID, ED: LACTIC ACID, VENOUS: 1.41 mmol/L (ref 0.5–1.9)

## 2017-10-20 LAB — BASIC METABOLIC PANEL
Anion gap: 9 (ref 5–15)
BUN: 9 mg/dL (ref 6–20)
CHLORIDE: 101 mmol/L (ref 98–111)
CO2: 28 mmol/L (ref 22–32)
CREATININE: 1.01 mg/dL (ref 0.61–1.24)
Calcium: 9.1 mg/dL (ref 8.9–10.3)
Glucose, Bld: 117 mg/dL — ABNORMAL HIGH (ref 70–99)
POTASSIUM: 4.4 mmol/L (ref 3.5–5.1)
SODIUM: 138 mmol/L (ref 135–145)

## 2017-10-20 MED ORDER — DOXYCYCLINE HYCLATE 100 MG PO CAPS: 100.0000 mg | ORAL_CAPSULE | Freq: Two times a day (BID) | ORAL | 0 refills | Status: DC

## 2017-10-20 MED ORDER — DOXYCYCLINE HYCLATE 100 MG PO TABS
100.0000 mg | ORAL_TABLET | Freq: Once | ORAL | Status: AC
  Administered 2017-10-20: 100 mg via ORAL
  Filled 2017-10-20: qty 1

## 2017-10-20 MED ORDER — MORPHINE SULFATE (PF) 4 MG/ML IV SOLN
4.0000 mg | Freq: Once | INTRAVENOUS | Status: AC
  Administered 2017-10-20: 4 mg via INTRAVENOUS
  Filled 2017-10-20: qty 1

## 2017-10-20 MED ORDER — ONDANSETRON HCL 4 MG/2ML IJ SOLN
4.0000 mg | Freq: Once | INTRAMUSCULAR | Status: AC
  Administered 2017-10-20: 4 mg via INTRAVENOUS
  Filled 2017-10-20: qty 2

## 2017-10-20 NOTE — ED Notes (Signed)
The patient states that he has been having significant issues with his leg over the past couple months, but that he started having some issues about 9 months ago. That he had a few ulcers that would pop up then heal, and then a new one would pop up.

## 2017-10-20 NOTE — Discharge Instructions (Signed)
If you develop worsening redness, if you develop high fever, increasing pain, vomiting, or any other new/concerning symptoms then return to the ER for evaluation.

## 2017-10-20 NOTE — ED Provider Notes (Signed)
[facility] EMERGENCY DEPARTMENT Provider Note   CSN: [id number] Arrival date & time: 10/20/17  0908     History   Chief Complaint Chief Complaint  Patient presents with  . Cellulitis    HPI Ricardo Lopez. is a 42 y.o. male with a history of hypertension, obesity and endorses positive varicose veins in his legs presenting with bilateral lower extremity edema, but specific right lower extremity worsened edema, pain, erythema along with an ulceration at his lateral ankle which has been worsening over the past several days.  He has had multiple home treatments including skin lotions including an unknown OTC topical medication recommended by a pharmacist to help prevent infection with no improvement in symptoms.  He works in a job where he is on his feet all day, reports worsening swelling at the end of the day, last worked 2 days ago.  He reports subjective fever and nausea which she suspects is secondary to pain in the right leg.  He describes a burning intense pain in the right lower leg.  Has taken ibuprofen with no improvement in pain symptoms.  He denies chest pain, shortness of breath, abdominal pain or vomiting.  The history is provided by the patient.    Past Medical History:  Diagnosis Date  . Hypertension   . Obesity     Patient Active Problem List   Diagnosis Date Noted  . Chronic pain syndrome 10/15/2012  . Osteochondral defect of ankle 05/26/2012  . Ankle arthritis 05/26/2012  . Right knee pain 05/26/2012  . Medial meniscus, posterior horn derangement 05/26/2012  . Obesity   . Hypertension     History reviewed. No pertinent surgical history.      Home Medications    Prior to Admission medications   Medication Sig Start Date End Date Taking? Authorizing Provider  Cholecalciferol (VITAMIN D PO) Take 1 capsule by mouth daily.   Yes [provider]  Cyanocobalamin (B-12 PO) Take 1 capsule by mouth daily.   Yes [provider]    Ibuprofen-Diphenhydramine HCl (ADVIL PM) 200-25 MG CAPS Take by mouth. Reported on 02/28/2015   Yes [provider]  naproxen sodium (ANAPROX) 220 MG tablet Take 220 mg by mouth 2 (two) times daily with a meal. Reported on 02/28/2015   Yes [provider]  doxycycline (VIBRAMYCIN) 100 MG capsule Take 1 capsule (100 mg total) by mouth 2 (two) times daily. One po bid x 7 days 10/20/17   Pricilla Loveless, MD    Family History History reviewed. No pertinent family history.  Social History Social History   Tobacco Use  . Smoking status: Never Smoker  Substance Use Topics  . Alcohol use: Yes    Comment: occasional  . Drug use: No     Allergies   Patient has no active allergies.   Review of Systems Review of Systems  Constitutional: Positive for fever.  HENT: Negative for congestion and sore throat.   Eyes: Negative.   Respiratory: Negative for chest tightness and shortness of breath.   Cardiovascular: Positive for leg swelling. Negative for chest pain.  Gastrointestinal: Positive for nausea. Negative for abdominal pain.  Genitourinary: Negative.   Musculoskeletal: Negative for arthralgias, joint swelling and neck pain.  Skin: Positive for color change and wound. Negative for rash.  Neurological: Negative for dizziness, weakness, light-headedness, numbness and headaches.  Psychiatric/Behavioral: Negative.      Physical Exam Updated Vital Signs BP (!) 165/99 (BP Location: Left Arm)   Pulse (!) 105  Temp 99.2 F (37.3 C) (Oral)   Resp (!) 22   Ht 5\' 8"  (1.727 m)   Wt (!) 180.5 kg   SpO2 98%   BMI 60.52 kg/m   Physical Exam  Constitutional: He appears well-developed and well-nourished.  Morbidly obese.  HENT:  Head: Normocephalic and atraumatic.  Cardiovascular: Normal rate, regular rhythm, normal heart sounds and intact distal pulses.  Pulses:      Dorsalis pedis pulses are 2+ on the right side, and 2+ on the left side.  Pulmonary/Chest: Effort  normal and breath sounds normal. He has no wheezes.  Abdominal: Soft. Bowel sounds are normal. There is no tenderness.  Musculoskeletal: Normal range of motion.  Neurological: He is alert.  Skin: Skin is warm and dry.  Patient has bilateral lower extremity scaling edematous and indurated skin consistent with venous stasis dermatitis, with the right lower extremity significantly worsened in the left.  There is erythema and increased warmth of his right lower leg.  There is a quarter sized ulceration at the lower lateral leg which is draining purulent discharge.  Moderate scaling to his skin right significantly worsened than left.  Psychiatric: He has a normal mood and affect.  Nursing note and vitals reviewed.    ED Treatments / Results  Labs (all labs ordered are listed, but only abnormal results are displayed) Labs Reviewed  CBC WITH DIFFERENTIAL/PLATELET - Abnormal; Notable for the following components:      Result Value   Hemoglobin 9.4 (*)    HCT 31.5 (*)    MCV 68.2 (*)    MCH 20.3 (*)    MCHC 29.8 (*)    RDW 16.3 (*)    All other components within normal limits  BASIC METABOLIC PANEL - Abnormal; Notable for the following components:   Glucose, Bld 117 (*)    All other components within normal limits  CULTURE, BLOOD (ROUTINE X 2)  CULTURE, BLOOD (ROUTINE X 2)  I-STAT CG4 LACTIC ACID, ED  I-STAT CG4 LACTIC ACID, ED    EKG None  Radiology Dg Tibia/fibula Right  Result Date: 10/20/2017 CLINICAL DATA:  Edema and infection of the RIGHT lower leg, multiple falls over the last few months EXAM: RIGHT TIBIA AND FIBULA - 2 VIEW COMPARISON:  None FINDINGS: Diffuse soft tissue swelling. Small soft tissue lucency lateral aspect of the distal RIGHT lower leg question ulcer. Osseous mineralization normal. Joint space narrowing and spur formation at RIGHT knee. Ankle joint space preserved. No acute fracture, dislocation, or bone destruction. Small plantar calcaneal spur IMPRESSION:  Degenerative changes RIGHT knee. No acute osseous findings. Soft tissue swelling with suspected ulcer at distal lateral RIGHT lower leg. Electronically Signed   By: Ulyses Southward M.D.   On: 10/20/2017 10:31    Procedures Procedures (including critical care time)  Medications Ordered in ED Medications  doxycycline (VIBRA-TABS) tablet 100 mg (has no administration in time range)  morphine 4 MG/ML injection 4 mg (4 mg Intravenous Given 10/20/17 1112)  ondansetron (ZOFRAN) injection 4 mg (4 mg Intravenous Given 10/20/17 1112)     Initial Impression / Assessment and Plan / ED Course  I have reviewed the triage vital signs and the nursing notes.  Pertinent labs & imaging results that were available during my care of the patient were reviewed by me and considered in my medical decision making (see chart for details).     Patient with cellulitis of right lower extremity with normal labs and no imaging to suggest osteomyelitis.  Patient  was started on doxycycline, plan follow-up with his PCP or return here for any worsening symptoms.  Patient was seen by Dr. Criss Alvine during this ED visit.  Final Clinical Impressions(s) / ED Diagnoses   Final diagnoses:  Cellulitis of right leg    ED Discharge Orders         Ordered    doxycycline (VIBRAMYCIN) 100 MG capsule  2 times daily     10/20/17 1239           Burgess Amor, PA-C 10/20/17 1252    Pricilla Loveless, MD 10/20/17 [phone]

## 2017-10-20 NOTE — ED Triage Notes (Signed)
Pt states leg weeping, redness and swelling increased over past several days. Low grade fever, and ulcers are draining.

## 2017-10-20 NOTE — ED Notes (Signed)
The patient states that he has ben having significant issues with his leg over the last couple months

## 2017-10-25 LAB — CULTURE, BLOOD (ROUTINE X 2)
Culture: NO GROWTH
Culture: NO GROWTH
SPECIAL REQUESTS: ADEQUATE
Special Requests: ADEQUATE

## 2019-09-29 ENCOUNTER — Other Ambulatory Visit: Payer: Self-pay

## 2019-09-29 ENCOUNTER — Ambulatory Visit
Admission: EM | Admit: 2019-09-29 | Discharge: 2019-09-29 | Disposition: A | Discharge status: Home / Self Care | Payer: 59

## 2019-09-29 ENCOUNTER — Emergency Department (HOSPITAL_COMMUNITY): Payer: 59

## 2019-09-29 ENCOUNTER — Encounter (HOSPITAL_COMMUNITY): Payer: Self-pay

## 2019-09-29 ENCOUNTER — Encounter: Payer: Self-pay | Admitting: Emergency Medicine

## 2019-09-29 ENCOUNTER — Inpatient Hospital Stay (HOSPITAL_COMMUNITY)
Admission: EM | Admit: 2019-09-29 | Discharge: 2019-10-07 | DRG: 177 | Disposition: A | Discharge status: Home / Self Care | Payer: 59 | Attending: Internal Medicine | Admitting: Internal Medicine

## 2019-09-29 DIAGNOSIS — Z6841 Body Mass Index (BMI) 40.0 and over, adult: Secondary | ICD-10-CM

## 2019-09-29 DIAGNOSIS — R7401 Elevation of levels of liver transaminase levels: Secondary | ICD-10-CM | POA: Diagnosis not present

## 2019-09-29 DIAGNOSIS — D509 Iron deficiency anemia, unspecified: Secondary | ICD-10-CM | POA: Diagnosis present

## 2019-09-29 DIAGNOSIS — U071 COVID-19: Principal | ICD-10-CM | POA: Diagnosis present

## 2019-09-29 DIAGNOSIS — J9601 Acute respiratory failure with hypoxia: Secondary | ICD-10-CM

## 2019-09-29 DIAGNOSIS — N179 Acute kidney failure, unspecified: Secondary | ICD-10-CM | POA: Diagnosis present

## 2019-09-29 DIAGNOSIS — E869 Volume depletion, unspecified: Secondary | ICD-10-CM | POA: Diagnosis present

## 2019-09-29 DIAGNOSIS — I10 Essential (primary) hypertension: Secondary | ICD-10-CM | POA: Diagnosis present

## 2019-09-29 DIAGNOSIS — D649 Anemia, unspecified: Secondary | ICD-10-CM | POA: Diagnosis present

## 2019-09-29 DIAGNOSIS — J1282 Pneumonia due to coronavirus disease 2019: Secondary | ICD-10-CM

## 2019-09-29 LAB — CBC WITH DIFFERENTIAL/PLATELET
Abs Immature Granulocytes: 0.05 10*3/uL (ref 0.00–0.07)
Basophils Absolute: 0 10*3/uL (ref 0.0–0.1)
Basophils Relative: 0 %
Eosinophils Absolute: 0 10*3/uL (ref 0.0–0.5)
Eosinophils Relative: 0 %
HCT: 37.9 % — ABNORMAL LOW (ref 39.0–52.0)
Hemoglobin: 11.4 g/dL — ABNORMAL LOW (ref 13.0–17.0)
Immature Granulocytes: 1 %
Lymphocytes Relative: 5 %
Lymphs Abs: 0.4 10*3/uL — ABNORMAL LOW (ref 0.7–4.0)
MCH: 20.2 pg — ABNORMAL LOW (ref 26.0–34.0)
MCHC: 30.1 g/dL (ref 30.0–36.0)
MCV: 67.3 fL — ABNORMAL LOW (ref 80.0–100.0)
Monocytes Absolute: 0.5 10*3/uL (ref 0.1–1.0)
Monocytes Relative: 6 %
Neutro Abs: 6.8 10*3/uL (ref 1.7–7.7)
Neutrophils Relative %: 88 %
Platelets: 236 10*3/uL (ref 150–400)
RBC: 5.63 MIL/uL (ref 4.22–5.81)
RDW: 15.3 % (ref 11.5–15.5)
WBC: 7.7 10*3/uL (ref 4.0–10.5)
nRBC: 0 % (ref 0.0–0.2)

## 2019-09-29 LAB — C-REACTIVE PROTEIN: CRP: 19.3 mg/dL — ABNORMAL HIGH (ref <1.0)

## 2019-09-29 LAB — COMPREHENSIVE METABOLIC PANEL
ALT: 115 U/L — ABNORMAL HIGH (ref 0–44)
AST: 142 U/L — ABNORMAL HIGH (ref 15–41)
Albumin: 3.8 g/dL (ref 3.5–5.0)
Alkaline Phosphatase: 62 U/L (ref 38–126)
Anion gap: 14 (ref 5–15)
BUN: 19 mg/dL (ref 6–20)
CO2: 24 mmol/L (ref 22–32)
Calcium: 8.9 mg/dL (ref 8.9–10.3)
Chloride: 98 mmol/L (ref 98–111)
Creatinine, Ser: 1.65 mg/dL — ABNORMAL HIGH (ref 0.61–1.24)
GFR calc Af Amer: 58 mL/min — ABNORMAL LOW (ref >60)
GFR calc non Af Amer: 50 mL/min — ABNORMAL LOW (ref >60)
Glucose, Bld: 128 mg/dL — ABNORMAL HIGH (ref 70–99)
Potassium: 4 mmol/L (ref 3.5–5.1)
Sodium: 136 mmol/L (ref 135–145)
Total Bilirubin: 1 mg/dL (ref 0.3–1.2)
Total Protein: 9.2 g/dL — ABNORMAL HIGH (ref 6.5–8.1)

## 2019-09-29 LAB — LACTATE DEHYDROGENASE: LDH: 449 U/L — ABNORMAL HIGH (ref 98–192)

## 2019-09-29 LAB — TRIGLYCERIDES: Triglycerides: 100 mg/dL (ref <150)

## 2019-09-29 LAB — FERRITIN: Ferritin: 392 ng/mL — ABNORMAL HIGH (ref 24–336)

## 2019-09-29 LAB — FIBRINOGEN: Fibrinogen: 789 mg/dL — ABNORMAL HIGH (ref 210–475)

## 2019-09-29 LAB — SARS CORONAVIRUS 2 BY RT PCR (HOSPITAL ORDER, PERFORMED IN ~~LOC~~ HOSPITAL LAB): SARS Coronavirus 2: POSITIVE — AB

## 2019-09-29 LAB — PROCALCITONIN: Procalcitonin: 0.25 ng/mL

## 2019-09-29 LAB — D-DIMER, QUANTITATIVE: D-Dimer, Quant: 1.17 ug/mL-FEU — ABNORMAL HIGH (ref 0.00–0.50)

## 2019-09-29 LAB — LACTIC ACID, PLASMA: Lactic Acid, Venous: 1.6 mmol/L (ref 0.5–1.9)

## 2019-09-29 MED ORDER — BARICITINIB 2 MG PO TABS
4.0000 mg | ORAL_TABLET | Freq: Every day | ORAL | Status: DC
  Administered 2019-09-29 – 2019-10-01 (×3): 4 mg via ORAL
  Filled 2019-09-29 (×2): qty 2

## 2019-09-29 MED ORDER — FAMOTIDINE 20 MG PO TABS
20.0000 mg | ORAL_TABLET | Freq: Every day | ORAL | Status: DC
  Administered 2019-09-29 – 2019-10-07 (×9): 20 mg via ORAL
  Filled 2019-09-29 (×9): qty 1

## 2019-09-29 MED ORDER — HYDROCOD POLST-CPM POLST ER 10-8 MG/5ML PO SUER: 5.0000 mL | Freq: Two times a day (BID) | ORAL | Status: DC | PRN

## 2019-09-29 MED ORDER — ENOXAPARIN SODIUM 80 MG/0.8ML ~~LOC~~ SOLN
80.0000 mg | SUBCUTANEOUS | Status: DC
  Administered 2019-09-29 – 2019-10-06 (×8): 80 mg via SUBCUTANEOUS
  Filled 2019-09-29 (×8): qty 0.8

## 2019-09-29 MED ORDER — SODIUM CHLORIDE 0.9 % IV SOLN
INTRAVENOUS | Status: DC | PRN
  Administered 2019-09-29: 250 mL via INTRAVENOUS

## 2019-09-29 MED ORDER — GUAIFENESIN-DM 100-10 MG/5ML PO SYRP
10.0000 mL | ORAL_SOLUTION | ORAL | Status: DC | PRN
  Administered 2019-09-29 – 2019-10-07 (×4): 10 mL via ORAL
  Filled 2019-09-29 (×5): qty 10

## 2019-09-29 MED ORDER — ONDANSETRON HCL 4 MG/2ML IJ SOLN: 4.0000 mg | Freq: Four times a day (QID) | INTRAMUSCULAR | Status: DC | PRN

## 2019-09-29 MED ORDER — DEXAMETHASONE SODIUM PHOSPHATE 10 MG/ML IJ SOLN
10.0000 mg | Freq: Once | INTRAMUSCULAR | Status: AC
  Administered 2019-09-29: 10 mg via INTRAVENOUS
  Filled 2019-09-29: qty 1

## 2019-09-29 MED ORDER — SODIUM CHLORIDE 0.45 % IV SOLN: INTRAVENOUS | Status: AC

## 2019-09-29 MED ORDER — SODIUM CHLORIDE 0.9 % IV BOLUS
500.0000 mL | Freq: Once | INTRAVENOUS | Status: AC
  Administered 2019-09-29: 500 mL via INTRAVENOUS

## 2019-09-29 MED ORDER — ACETAMINOPHEN 325 MG PO TABS
650.0000 mg | ORAL_TABLET | Freq: Once | ORAL | Status: AC
  Administered 2019-09-29: 650 mg via ORAL
  Filled 2019-09-29: qty 2

## 2019-09-29 MED ORDER — SODIUM CHLORIDE 0.9 % IV SOLN
100.0000 mg | INTRAVENOUS | Status: AC
  Administered 2019-09-29 (×2): 100 mg via INTRAVENOUS
  Filled 2019-09-29: qty 20

## 2019-09-29 MED ORDER — SODIUM CHLORIDE 0.9 % IV SOLN
100.0000 mg | Freq: Every day | INTRAVENOUS | Status: DC
  Administered 2019-09-30 – 2019-10-01 (×2): 100 mg via INTRAVENOUS
  Filled 2019-09-29 (×2): qty 20

## 2019-09-29 MED ORDER — METHYLPREDNISOLONE SODIUM SUCC 125 MG IJ SOLR
80.0000 mg | Freq: Two times a day (BID) | INTRAMUSCULAR | Status: DC
  Administered 2019-09-29 – 2019-10-01 (×4): 80 mg via INTRAVENOUS
  Filled 2019-09-29 (×4): qty 2

## 2019-09-29 MED ORDER — ACETAMINOPHEN 325 MG PO TABS
650.0000 mg | ORAL_TABLET | Freq: Four times a day (QID) | ORAL | Status: DC | PRN
  Administered 2019-09-29 – 2019-10-02 (×4): 650 mg via ORAL
  Filled 2019-09-29 (×4): qty 2

## 2019-09-29 MED ORDER — ZINC SULFATE 220 (50 ZN) MG PO CAPS
220.0000 mg | ORAL_CAPSULE | Freq: Every day | ORAL | Status: DC
  Administered 2019-09-29 – 2019-10-07 (×9): 220 mg via ORAL
  Filled 2019-09-29 (×9): qty 1

## 2019-09-29 MED ORDER — ONDANSETRON HCL 4 MG PO TABS
4.0000 mg | ORAL_TABLET | Freq: Four times a day (QID) | ORAL | Status: DC | PRN
  Administered 2019-10-03: 4 mg via ORAL
  Filled 2019-09-29: qty 1

## 2019-09-29 MED ORDER — KETOROLAC TROMETHAMINE 30 MG/ML IJ SOLN
30.0000 mg | Freq: Once | INTRAMUSCULAR | Status: AC
  Administered 2019-09-29: 30 mg via INTRAVENOUS
  Filled 2019-09-29: qty 1

## 2019-09-29 MED ORDER — ASCORBIC ACID 500 MG PO TABS
500.0000 mg | ORAL_TABLET | Freq: Every day | ORAL | Status: DC
  Administered 2019-09-29 – 2019-10-07 (×9): 500 mg via ORAL
  Filled 2019-09-29 (×9): qty 1

## 2019-09-29 NOTE — ED Provider Notes (Addendum)
[facility] EMERGENCY DEPARTMENT Provider Note   CSN: [id number] Arrival date & time: 09/29/19  1412    History Chief Complaint  Patient presents with  . Shortness of Breath    Ricardo Lopez. is a 44 y.o. male with past medical history significant for hypertension, obesity who presents for evaluation of shortness of breath.  Symptoms started on Sunday, 4 days PTA.  Was around a family member who tested positive for Covid. Has had some generalized myalgias, shortness of breath, cough, congestion and rhinorrhea.  He is not take anything for symptoms.  Admits to generalized weakness and fatigue.  His headache, lightheadedness, dizziness, chest pain, abdominal pain, diarrhea, dysuria, unilateral leg swelling, redness or warmth.  He is not a tobacco user.  No history of cardiopulmonary disorders.  Denies additional aggravating or alleviating factors.  History obtained from patient and past medical records.  No interpreter used.  NOT COVID vaccinated  HPI     Past Medical History:  Diagnosis Date  . Hypertension   . Obesity     Patient Active Problem List   Diagnosis Date Noted  . Chronic pain syndrome 10/15/2012  . Osteochondral defect of ankle 05/26/2012  . Ankle arthritis 05/26/2012  . Right knee pain 05/26/2012  . Medial meniscus, posterior horn derangement 05/26/2012  . Obesity   . Hypertension     History reviewed. No pertinent surgical history.     No family history on file.  Social History   Tobacco Use  . Smoking status: Never Smoker  . Smokeless tobacco: Never Used  Vaping Use  . Vaping Use: Never used  Substance Use Topics  . Alcohol use: Yes    Comment: occasional  . Drug use: No    Home Medications Prior to Admission medications   Medication Sig Start Date End Date Taking? Authorizing Provider  doxycycline (VIBRAMYCIN) 100 MG capsule Take 1 capsule (100 mg total) by mouth 2 (two) times daily. One po bid x 7 days Patient not taking: Reported on  09/29/2019 10/20/17   Pricilla Loveless, MD    Allergies    Patient has no known allergies.  Review of Systems   Review of Systems  Constitutional: Positive for activity change, appetite change and fatigue.  HENT: Positive for congestion and rhinorrhea.   Respiratory: Positive for cough and shortness of breath. Negative for apnea, choking, chest tightness, wheezing and stridor.   Cardiovascular: Negative.   Gastrointestinal: Negative.   Genitourinary: Negative.   Musculoskeletal: Negative.   Skin: Negative.   Neurological: Positive for weakness (Generalized). Negative for dizziness, tremors, seizures, syncope, facial asymmetry, speech difficulty, light-headedness, numbness and headaches.  All other systems reviewed and are negative.  Physical Exam Updated Vital Signs BP 133/69   Pulse (!) 102   Temp (!) 102 F (38.9 C) (Oral)   Resp (!) 35   Ht 5\' 8"  (1.727 m)   Wt (!) 158.8 kg   SpO2 93%   BMI 53.22 kg/m   Physical Exam Vitals and nursing note reviewed.  Constitutional:      General: He is in acute distress.     Appearance: He is ill-appearing. He is not toxic-appearing or diaphoretic.  HENT:     Head: Normocephalic and atraumatic.     Jaw: There is normal jaw occlusion.     Right Ear: Tympanic membrane, ear canal and external ear normal. There is no impacted cerumen. No hemotympanum. Tympanic membrane is not injected, scarred, perforated, erythematous, retracted or bulging.  Left Ear: Tympanic membrane, ear canal and external ear normal. There is no impacted cerumen. No hemotympanum. Tympanic membrane is not injected, scarred, perforated, erythematous, retracted or bulging.     Ears:     Comments: No Mastoid tenderness.    Nose:     Comments: Clear rhinorrhea and congestion to bilateral nares.  No sinus tenderness.    Mouth/Throat:     Comments: Posterior oropharynx clear.  Mucous membranes moist.  Tonsils without erythema or exudate.  Uvula midline without deviation.   No evidence of PTA or RPA.  No drooling, dysphasia or trismus.  Phonation normal. Neck:     Trachea: Trachea and phonation normal.     Meningeal: Brudzinski's sign and Kernig's sign absent.     Comments: No Neck stiffness or neck rigidity.  No meningismus.  No cervical lymphadenopathy. Cardiovascular:     Rate and Rhythm: Tachycardia present.     Pulses: Normal pulses.     Heart sounds: Normal heart sounds.     Comments: No murmurs rubs or gallops. Pulmonary:     Effort: Tachypnea, accessory muscle usage and respiratory distress present.     Comments: Clear to auscultation bilaterally without wheeze, rhonchi or rales.  No accessory muscle usage.  Able speak in full sentences. Abdominal:     General: Bowel sounds are normal.     Palpations: Abdomen is soft. There is no hepatomegaly, splenomegaly or mass.     Tenderness: There is no abdominal tenderness. There is no guarding or rebound.     Comments: Soft, nontender without rebound or guarding.  No CVA tenderness.  Musculoskeletal:        General: Normal range of motion.     Comments: Moves all 4 extremities without difficulty.  Lower extremities without edema, erythema or warmth.  Skin:    General: Skin is warm.     Capillary Refill: Capillary refill takes less than 2 seconds.     Comments: Brisk capillary refill.  No rashes or lesions.  Neurological:     General: No focal deficit present.     Mental Status: He is alert.     Comments: Ambulatory in department without difficulty.  Cranial nerves II through XII grossly intact.  No facial droop.  No dysphasia.    ED Results / Procedures / Treatments   Labs (all labs ordered are listed, but only abnormal results are displayed) Labs Reviewed  SARS CORONAVIRUS 2 BY RT PCR (HOSPITAL ORDER, PERFORMED IN [facility]) - Abnormal; Notable for the following components:      Result Value   SARS Coronavirus 2 POSITIVE (*)    All other components within normal limits  CBC WITH  DIFFERENTIAL/PLATELET - Abnormal; Notable for the following components:   Hemoglobin 11.4 (*)    HCT 37.9 (*)    MCV 67.3 (*)    MCH 20.2 (*)    Lymphs Abs 0.4 (*)    All other components within normal limits  COMPREHENSIVE METABOLIC PANEL - Abnormal; Notable for the following components:   Glucose, Bld 128 (*)    Creatinine, Ser 1.65 (*)    Total Protein 9.2 (*)    AST 142 (*)    ALT 115 (*)    GFR calc non Af Amer 50 (*)    GFR calc Af Amer 58 (*)    All other components within normal limits  D-DIMER, QUANTITATIVE (NOT AT [facility]) - Abnormal; Notable for the following components:   D-Dimer, Quant 1.17 (*)  All other components within normal limits  LACTATE DEHYDROGENASE - Abnormal; Notable for the following components:   LDH 449 (*)    All other components within normal limits  FERRITIN - Abnormal; Notable for the following components:   Ferritin 392 (*)    All other components within normal limits  FIBRINOGEN - Abnormal; Notable for the following components:   Fibrinogen 789 (*)    All other components within normal limits  C-REACTIVE PROTEIN - Abnormal; Notable for the following components:   CRP 19.3 (*)    All other components within normal limits  CULTURE, BLOOD (ROUTINE X 2)  CULTURE, BLOOD (ROUTINE X 2)  LACTIC ACID, PLASMA  PROCALCITONIN  TRIGLYCERIDES   EKG EKG Interpretation  Date/Time:  Wednesday September 29 2019 14:19:10 EDT Ventricular Rate:  121 PR Interval:    QRS Duration: 87 QT Interval:  318 QTC Calculation: 452 R Axis:   79 Text Interpretation: Sinus tachycardia Non-specific ST-t changes Confirmed by Cathren LaineSteinl, Kevin ([id number]) on 09/29/2019 3:35:17 PM   Radiology DG Chest Port 1 View  Result Date: 09/29/2019 CLINICAL DATA:  Cough and shortness of breath.  Diffuse pain. EXAM: PORTABLE CHEST 1 VIEW COMPARISON:  None. FINDINGS: Borderline cardiomegaly. Heterogeneous bilateral lung opacities. No pneumothorax or large pleural effusion. Degenerative change  of both acromioclavicular joints. No evidence of acute osseous abnormality. IMPRESSION: Heterogeneous bilateral lung opacities, suspicious for multifocal pneumonia, including COVID-19. Electronically Signed   By: Narda RutherfordMelanie  Sanford M.D.   On: 09/29/2019 15:05    Procedures .Critical Care Performed by: Linwood DibblesHenderly, Elster Corbello A, PA-C Authorized by: Linwood DibblesHenderly, Abigail Marsiglia A, PA-C   Critical care provider statement:    Critical care time (minutes):  45   Critical care was necessary to treat or prevent imminent or life-threatening deterioration of the following conditions:  Respiratory failure   Critical care was time spent personally by me on the following activities:  Discussions with consultants, evaluation of patient's response to treatment, examination of patient, ordering and performing treatments and interventions, ordering and review of laboratory studies, ordering and review of radiographic studies, pulse oximetry, re-evaluation of patient's condition, obtaining history from patient or surrogate and review of old charts   (including critical care time)  Medications Ordered in ED Medications  dexamethasone (DECADRON) injection 10 mg (10 mg Intravenous Given 09/29/19 1501)  acetaminophen (TYLENOL) tablet 650 mg (650 mg Oral Given 09/29/19 1501)  sodium chloride 0.9 % bolus 500 mL (0 mLs Intravenous Stopped 09/29/19 1600)  ketorolac (TORADOL) 30 MG/ML injection 30 mg (30 mg Intravenous Given 09/29/19 1705)   ED Course  I have reviewed the triage vital signs and the nursing notes.  Pertinent labs & imaging results that were available during my care of the patient were reviewed by me and considered in my medical decision making (see chart for details).  44 year old presents for evaluation of respiratory distress.  Known Covid exposure.  Was seen at urgent care and noted to be hypoxic to 69% on room air. Has had some myalgias, cough, congestion, rhinorrhea. Abd soft. Heart and lungs clear.  Patient febrile,  tachycardic, tachypneic and hypoxic.  Given known Covid exposure code sepsis held until COVID test returned.  He will be given antipyretics, gentle fluid bolus, hold on antibiotics given likely viral etiology of symptoms.  Patient comfortable on 5 L via nasal cannula no acute distress after supplemental oxygen.   Labs and imaging personally reviewed and interpreted:  CBC without leukocytosis, hemoglobin 11.4 Lactic acid 1.6, blood cultures pending Metabolic panel with  mild hyperglycemia to 128, creatinine 1.65, increased from prior, mild elevation in LFTs, no additional electrolyte, renal or liver abnormality D-dimer 1.17 Elevated inflammatory markers DG chest with bilateral infiltrates suspicious for atypical pneumonia, likely Covid COVID positive  Patient reassessed.  Comfortable on 5 L via nasal cannula.  Discussed positive Covid test.  No unilateral leg swelling, redness or warmth.  Low suspicion for PE.  Patient's hypoxic respiratory failure likely due to his known Covid infection.  Will consult hospitalist for admission.  Patient was given steroids, IV fluids as well as remdesivir. Low suspicion for sepsis.  CONSULT with Dr. Rito Ehrlich with [facility] who will evaluate patient for admission.  The patient appears reasonably stabilized for admission considering the current resources, flow, and capabilities available in the ED at this time, and I doubt any other [facility] requiring further screening and/or treatment in the ED prior to admission.  Patient discussed with attending, Dr. Denton Lank who agrees above treatment, plan and disposition.     MDM Rules/Calculators/A&P                         Ricardo Lopez. was evaluated in Emergency Department on 09/29/2019 for the symptoms described in the history of present illness. He was evaluated in the context of the global COVID-19 pandemic, which necessitated consideration that the patient might be at risk for infection with the SARS-CoV-2 virus that causes  COVID-19. Institutional protocols and algorithms that pertain to the evaluation of patients at risk for COVID-19 are in a state of rapid change based on information released by regulatory bodies including the CDC and federal and state organizations. These policies and algorithms were followed during the patient's care in the ED. Final Clinical Impression(s) / ED Diagnoses Final diagnoses:  COVID-19  Acute respiratory failure with hypoxia (HCC)  AKI (acute kidney injury) [facility])    Rx / DC Orders ED Discharge Orders    None       Jaleiah Asay A, PA-C 09/29/19 1744    Ardena Gangl A, PA-C 09/29/19 1746    Cathren Laine, MD 10/02/19 [phone]

## 2019-09-29 NOTE — Progress Notes (Signed)
LFT's verified with admitting MD Rito Ehrlich. Ok to [employer] and trend LFT's daily.  Billye Nydam S. Merilynn Finland, PharmD, BCPS Clinical Staff Pharmacist [email or web address]

## 2019-09-29 NOTE — ED Notes (Signed)
Patient is being discharged from the Urgent Care and sent to the Emergency Department via ambulance . Per [facility] , patient is in need of higher level of care due to hypoxia. Patient is aware and verbalizes understanding of plan of care.  Vitals:   09/29/19 1329  BP: (!) 150/89  Pulse: (!) 114  Resp: (!) 28  Temp: 98.1 F (36.7 C)  SpO2: (!) 69%

## 2019-09-29 NOTE — H&P (Signed)
[facility] History and Physical  Ricardo Parkobert E Bolar Jr. [id number] DOB: 1975-02-05 DOA: 09/29/2019   PCP: Donita BrooksPickard, Warren T, MD  Specialists: None  Chief Complaint: Shortness of breath  HPI: Ricardo ParkRobert E Kleeman Jr. is a 44 y.o. male for the past medical history of severe obesity, hypertension for which he does not take any medications who presented with worsening shortness of breath with onset on Sunday.  He started having fatigue body aches.  He started having a cough with yellowish expectoration.  Unsure of any Covid exposures.  He has not been vaccinated against COVID-19.  Denies any chest pain.  Some nausea but no vomiting.  No diarrhea recently.  Denies any abdominal pain.  No dizziness lightheadedness.  In the emergency department chest x-ray showed pneumonia.  Patient was noted to be hypoxic.  He initially presented to the urgent care center from where he was sent to the emergency department.  He will need hospitalization for further management.  Home Medications: Prior to Admission medications   Medication Sig Start Date End Date Taking? Authorizing Provider  doxycycline (VIBRAMYCIN) 100 MG capsule Take 1 capsule (100 mg total) by mouth 2 (two) times daily. One po bid x 7 days Patient not taking: Reported on 09/29/2019 10/20/17   Pricilla LovelessGoldston, Scott, MD    Allergies: No Known Allergies  Past Medical History: Past Medical History:  Diagnosis Date  . Hypertension   . Obesity     Social History: He denies a smoking alcohol use or illicit drug use.  Usually independent with daily activities.  Family History:  Denies any significant health problems in the family  Review of Systems - History obtained from the patient General ROS: positive for  - fatigue and fever Psychological ROS: negative Ophthalmic ROS: negative ENT ROS: negative Allergy and Immunology ROS: negative Hematological and Lymphatic ROS: negative Endocrine ROS: negative Respiratory ROS: As in HPI Cardiovascular  ROS: As in HPI Gastrointestinal ROS: no abdominal pain, change in bowel habits, or black or bloody stools Genito-Urinary ROS: no dysuria, trouble voiding, or hematuria Musculoskeletal ROS: negative Neurological ROS: no TIA or stroke symptoms Dermatological ROS: negative  Physical Examination  Vitals:   09/29/19 1830 09/29/19 2000 09/29/19 2003 09/29/19 2004  BP: 128/78  129/73   Pulse: 97  86   Resp: (!) 35 (!) 28 (!) 27 20  Temp:      TempSrc:   (P) Oral   SpO2: 94%  95%   Weight:      Height:        BP (P) 129/73 (BP Location: Left Wrist)   Pulse 86   Temp (!) 102 F (38.9 C) (Oral)   Resp 20   Ht 5\' 8"  (1.727 m)   Wt (!) 158.8 kg   SpO2 95%   BMI 53.22 kg/m   General appearance: alert, cooperative, appears stated age and no distress Head: Normocephalic, without obvious abnormality, atraumatic Eyes: conjunctivae/corneas clear. PERRL, EOM's intact.  Throat: lips, mucosa, and tongue normal; teeth and gums normal Neck: no adenopathy, no carotid bruit, no JVD, supple, symmetrical, trachea midline and thyroid not enlarged, symmetric, no tenderness/mass/nodules Resp: Noted to be mildly tachypneic.  Coarse breath sounds with crackles bilateral bases.  No wheezing or rhonchi.  No use of accessory muscles currently. Cardio: regular rate and rhythm, S1, S2 normal, no murmur, click, rub or gallop GI: soft, non-tender; bowel sounds normal; no masses,  no organomegaly Extremities: Chronic venous stasis with skin changes.  Chronic edema. Pulses: 2+ and symmetric  Skin: Skin color, texture, turgor normal. No rashes or lesions Lymph nodes: Cervical, supraclavicular, and axillary nodes normal. Neurologic: Alert and oriented x3.  No obvious focal neurological deficits.   Labs on Admission: I have personally reviewed following labs and imaging studies  CBC: Recent Labs  Lab 09/29/19 1514  WBC 7.7  NEUTROABS 6.8  HGB 11.4*  HCT 37.9*  MCV 67.3*  PLT 236   Basic Metabolic  Panel: Recent Labs  Lab 09/29/19 1514  NA 136  K 4.0  CL 98  CO2 24  GLUCOSE 128*  BUN 19  CREATININE 1.65*  CALCIUM 8.9   GFR: Estimated Creatinine Clearance: 85.4 mL/min (A) (by C-G formula based on SCr of 1.65 mg/dL (H)). Liver Function Tests: Recent Labs  Lab 09/29/19 1514  AST 142*  ALT 115*  ALKPHOS 62  BILITOT 1.0  PROT 9.2*  ALBUMIN 3.8   Lipid Profile: Recent Labs    09/29/19 1514  TRIG 100   Anemia Panel: Recent Labs    09/29/19 1514  FERRITIN 392*     Radiological Exams on Admission: DG Chest Port 1 View  Result Date: 09/29/2019 CLINICAL DATA:  Cough and shortness of breath.  Diffuse pain. EXAM: PORTABLE CHEST 1 VIEW COMPARISON:  None. FINDINGS: Borderline cardiomegaly. Heterogeneous bilateral lung opacities. No pneumothorax or large pleural effusion. Degenerative change of both acromioclavicular joints. No evidence of acute osseous abnormality. IMPRESSION: Heterogeneous bilateral lung opacities, suspicious for multifocal pneumonia, including COVID-19. Electronically Signed   By: Narda Rutherford M.D.   On: 09/29/2019 15:05    My interpretation of Electrocardiogram: Sinus tachycardia in the 120s.  Normal axis.  Intervals are normal.  Nonspecific T wave changes.  No concerning ST segment changes.   Problem List  Principal Problem:   Pneumonia due to COVID-19 virus Active Problems:   Severe obesity (BMI >= 40) (HCC)   Acute respiratory failure with hypoxia (HCC)   Transaminitis   AKI (acute kidney injury) (HCC)   Microcytic anemia   Assessment: This is a 44 year old African-American male who is obese who comes in with shortness of breath and is found to have pneumonia due to COVID-19.  He has acute respiratory failure with hypoxia.  Plan:  Acute Hypoxic Resp. Failure/Pneumonia due to COVID-19   Recent Labs  Lab 09/29/19 1514  DDIMER 1.17*  FERRITIN 392*  CRP 19.3*  ALT 115*  PROCALCITON 0.25    Objective findings: Fever: He had a  fever with a temperature of 103.4 Oxygen requirements: Currently on 5 L of oxygen saturating in the early 90s.  COVID 19 Therapeutics: Antibacterials: Procalcitonin 0.25.  WBC is normal.  No need for antibacterials currently Remdesivir: Will be initiated.  LFT to be monitored daily. Steroids: Solu-Medrol Diuretics: None Actemra/Baricitinib: Initiated.  See below PUD Prophylaxis: Pepcid DVT Prophylaxis:  Lovenox, adjusted for weight  From a respiratory standpoint patient is currently on 5 L saturating early 90s.  He is severely obese.  So he is at high risk for decompensation.  His CRP is significantly elevated.  He is noted to have transaminitis.  D-dimer is 1.17.  Patient will be started on remdesivir and steroids.  Due to his obesity he is at high risk for decompensation.  He also has significantly elevated CRP.  He is a candidate for baricitinib.  Based on ACTT-2 and COV-BARRIER trials and other available data, baricitinib is being used under EUA by the FDA. The patient has no ESRD or AKI, known history of TB, severe neutropenia (ANC <500)  or lymphopenia (ALC <200), or severe LFT elevations. They are not on DMARDs or probenecid. The option to use/refuse baricitinib treatment under FDA authorization (not approval), the significant known and potential risks and benefits, the extent to which these are unknown, and information regarding all available alternatives were discussed in detail. Specifically the risk of VTE and secondary infections were discussed in detail with the patient. They consent to proceed with treatment. Patient started on baricitinib.  Patient encouraged to stay in prone position as much as possible.  Incentive spirometer.  Mobilization.  The treatment plan and use of medications and known side effects were discussed with patient/family. Some of the medications used are based on case reports/anecdotal data.  All other medications being used in the management of COVID-19 based on  limited study data.  Complete risks and long-term side effects are unknown, however in the best clinical judgment they seem to be of some benefit.  Patient/family wanted to proceed with treatment options provided.  Transaminitis Most likely due to COVID-19.  LFTs to be trended daily while the patient is on Remdesivir and baricitinib.  Patient does not have any history of liver disease.  Will check hepatitis panel.  Elevated creatinine/possible acute kidney injury Creatinine was normal in 2019.  There could be an element of hypovolemia although his BUN is normal.  We will gently hydrate and recheck labs tomorrow.  Elevated protein level Albumin is 3.8.  Recheck labs tomorrow.  Microcytic anemia Check anemia panel.  No evidence for overt bleeding.  Severe obesity Estimated body mass index is 53.22 kg/m as calculated from the following:   Height as of this encounter: 5\' 8"  (1.727 m).   Weight as of this encounter: 158.8 kg.   DVT Prophylaxis: Weight-based Lovenox Code Status: Full code Family Communication: Discussed with the patient Disposition: Hopefully return home in improved Consults called: None Admission Status: Status is: Inpatient  Remains inpatient appropriate because:IV treatments appropriate due to intensity of illness or inability to take PO and Inpatient level of care appropriate due to severity of illness   Dispo: The patient is from: Home              Anticipated d/c is to: Home              Anticipated d/c date is: 3 days              Patient currently is not medically stable to d/c.    Severity of Illness: The appropriate patient status for this patient is INPATIENT. Inpatient status is judged to be reasonable and necessary in order to provide the required intensity of service to ensure the patient's safety. The patient's presenting symptoms, physical exam findings, and initial radiographic and laboratory data in the context of their chronic comorbidities is felt  to place them at high risk for further clinical deterioration. Furthermore, it is not anticipated that the patient will be medically stable for discharge from the hospital within 2 midnights of admission. The following factors support the patient status of inpatient.   " The patient's presenting symptoms include shortness of breath. " The worrisome physical exam findings include hypoxia. " The initial radiographic and laboratory data are worrisome because of pneumonia. " The chronic co-morbidities include obesity.   * I certify that at the point of admission it is my clinical judgment that the patient will require inpatient hospital care spanning beyond 2 midnights from the point of admission due to high intensity of service, high risk for  further deterioration and high frequency of surveillance required.*    Further management decisions will depend on results of further testing and patient's response to treatment.   Zahlia Deshazer [facility]  [facility] [employer] on [email or web address]  09/29/2019, 8:19 PM

## 2019-09-29 NOTE — Discharge Instructions (Addendum)
Sent to ED via EMS  ?

## 2019-09-29 NOTE — ED Provider Notes (Signed)
[facility]    CSN: [id number] Arrival date & time: 09/29/19  1227      History   Chief Complaint Chief Complaint  Patient presents with  . Shortness of Breath    HPI Ricardo Lopez. is a 44 y.o. male history of hypertension, obesity, presenting today for evaluation of shortness of breath. Patient reports beginning Sunday he began to develop fatigue, body aches and progressive shortness of breath. He denies history of any underlying lung problems, denies asthma, tobacco use. Unsure of any Covid exposures.  HPI  Past Medical History:  Diagnosis Date  . Hypertension   . Obesity     Patient Active Problem List   Diagnosis Date Noted  . Chronic pain syndrome 10/15/2012  . Osteochondral defect of ankle 05/26/2012  . Ankle arthritis 05/26/2012  . Right knee pain 05/26/2012  . Medial meniscus, posterior horn derangement 05/26/2012  . Obesity   . Hypertension     History reviewed. No pertinent surgical history.     Home Medications    Prior to Admission medications   Medication Sig Start Date End Date Taking? Authorizing Provider  Cholecalciferol (VITAMIN D PO) Take 1 capsule by mouth daily.    [provider]  Cyanocobalamin (B-12 PO) Take 1 capsule by mouth daily.    [provider]  doxycycline (VIBRAMYCIN) 100 MG capsule Take 1 capsule (100 mg total) by mouth 2 (two) times daily. One po bid x 7 days 10/20/17   Pricilla Loveless, MD  Ibuprofen-Diphenhydramine HCl (ADVIL PM) 200-25 MG CAPS Take by mouth. Reported on 02/28/2015    [provider]  naproxen sodium (ANAPROX) 220 MG tablet Take 220 mg by mouth 2 (two) times daily with a meal. Reported on 02/28/2015    [provider]    Family History No family history on file.  Social History Social History   Tobacco Use  . Smoking status: Never Smoker  . Smokeless tobacco: Never Used  Vaping Use  . Vaping Use: Never used  Substance Use Topics  . Alcohol use: Yes     Comment: occasional  . Drug use: No     Allergies   Patient has no known allergies.   Review of Systems Review of Systems  Constitutional: Positive for fatigue. Negative for activity change, appetite change, chills and fever.  HENT: Negative for congestion, ear pain, rhinorrhea, sinus pressure, sore throat and trouble swallowing.   Eyes: Negative for discharge and redness.  Respiratory: Positive for cough and shortness of breath. Negative for chest tightness.   Cardiovascular: Negative for chest pain.  Gastrointestinal: Negative for abdominal pain, diarrhea, nausea and vomiting.  Musculoskeletal: Negative for myalgias.  Skin: Negative for rash.  Neurological: Negative for dizziness, light-headedness and headaches.     Physical Exam Triage Vital Signs ED Triage Vitals  Enc Vitals Group     BP 09/29/19 1329 (!) 150/89     Pulse Rate 09/29/19 1329 (!) 114     Resp 09/29/19 1329 (!) 28     Temp 09/29/19 1329 98.1 F (36.7 C)     Temp Source 09/29/19 1329 Oral     SpO2 09/29/19 1329 (!) 69 %     Weight 09/29/19 1330 (!) 397 lb 14.9 oz (180.5 kg)     Height 09/29/19 1330 5\' 8"  (1.727 m)     Head Circumference --      Peak Flow --      Pain Score --      Pain  Loc --      Pain Edu? --      Excl. in GC? --    No data found.  Updated Vital Signs BP (!) 150/89 (BP Location: Right Arm)   Pulse (!) 114   Temp 98.1 F (36.7 C) (Oral)   Resp (!) 28   Ht 5\' 8"  (1.727 m)   Wt (!) 397 lb 14.9 oz (180.5 kg)   SpO2 91%   BMI 60.51 kg/m   Visual Acuity Right Eye Distance:   Left Eye Distance:   Bilateral Distance:    Right Eye Near:   Left Eye Near:    Bilateral Near:     Physical Exam Vitals and nursing note reviewed.  Constitutional:      Appearance: He is well-developed.     Comments: No acute distress  HENT:     Head: Normocephalic and atraumatic.     Nose: Nose normal.  Eyes:     Conjunctiva/sclera: Conjunctivae normal.  Cardiovascular:     Rate and  Rhythm: Normal rate.  Pulmonary:     Effort: Pulmonary effort is normal. No respiratory distress.     Comments: Crackles to bilateral bases of lungs Abdominal:     General: There is no distension.  Musculoskeletal:        General: Normal range of motion.     Cervical back: Neck supple.  Skin:    General: Skin is warm and dry.  Neurological:     Mental Status: He is alert and oriented to person, place, and time.      UC Treatments / Results  Labs (all labs ordered are listed, but only abnormal results are displayed) Labs Reviewed - No data to display  EKG   Radiology No results found.  Procedures Procedures (including critical care time)  Medications Ordered in UC Medications - No data to display  Initial Impression / Assessment and Plan / UC Course  I have reviewed the triage vital signs and the nursing notes.  Pertinent labs & imaging results that were available during my care of the patient were reviewed by me and considered in my medical decision making (see chart for details).     Patient seen briefly after triage, initial O2 on room air 69%, improved to 91/92% on 3 L. EMS was called by nursing staff, wife informed. Patient sent to the emergency room for respiratory failure/hypoxia, suspicious of likely Covid pneumonia.    Final Clinical Impressions(s) / UC Diagnoses   Final diagnoses:  Acute respiratory failure with hypoxia [facility])     Discharge Instructions     Sent to ED via EMS    ED Prescriptions    None     PDMP not reviewed this encounter.   [facility], PA-C 09/29/19 1345

## 2019-09-29 NOTE — ED Triage Notes (Signed)
EMS reports was called to urgent care for c/o sob and room air o2 sat 69%.  EMS arrived and reports pt's o2 sat 91% on 3liters.  Pt c/o cough, sob, and pain all over, worse with coughing.

## 2019-09-29 NOTE — ED Triage Notes (Signed)
Shortness of breath , fatigue and body aches since Sunday. Pt resp labored with ambulation, inital o2 sat's on r/a 69%.  Placed on nasal cannula 3 liters sats are up to 92%

## 2019-09-30 LAB — CBC WITH DIFFERENTIAL/PLATELET
Abs Immature Granulocytes: 0.04 10*3/uL (ref 0.00–0.07)
Basophils Absolute: 0 10*3/uL (ref 0.0–0.1)
Basophils Relative: 0 %
Eosinophils Absolute: 0 10*3/uL (ref 0.0–0.5)
Eosinophils Relative: 0 %
HCT: 36.2 % — ABNORMAL LOW (ref 39.0–52.0)
Hemoglobin: 10.7 g/dL — ABNORMAL LOW (ref 13.0–17.0)
Immature Granulocytes: 1 %
Lymphocytes Relative: 8 %
Lymphs Abs: 0.7 10*3/uL (ref 0.7–4.0)
MCH: 20.1 pg — ABNORMAL LOW (ref 26.0–34.0)
MCHC: 29.6 g/dL — ABNORMAL LOW (ref 30.0–36.0)
MCV: 68 fL — ABNORMAL LOW (ref 80.0–100.0)
Monocytes Absolute: 0.5 10*3/uL (ref 0.1–1.0)
Monocytes Relative: 6 %
Neutro Abs: 7.5 10*3/uL (ref 1.7–7.7)
Neutrophils Relative %: 85 %
Platelets: 253 10*3/uL (ref 150–400)
RBC: 5.32 MIL/uL (ref 4.22–5.81)
RDW: 15.6 % — ABNORMAL HIGH (ref 11.5–15.5)
WBC: 8.7 10*3/uL (ref 4.0–10.5)
nRBC: 0 % (ref 0.0–0.2)

## 2019-09-30 LAB — COMPREHENSIVE METABOLIC PANEL
ALT: 94 U/L — ABNORMAL HIGH (ref 0–44)
AST: 101 U/L — ABNORMAL HIGH (ref 15–41)
Albumin: 3.3 g/dL — ABNORMAL LOW (ref 3.5–5.0)
Alkaline Phosphatase: 50 U/L (ref 38–126)
Anion gap: 14 (ref 5–15)
BUN: 26 mg/dL — ABNORMAL HIGH (ref 6–20)
CO2: 26 mmol/L (ref 22–32)
Calcium: 9.1 mg/dL (ref 8.9–10.3)
Chloride: 99 mmol/L (ref 98–111)
Creatinine, Ser: 1.4 mg/dL — ABNORMAL HIGH (ref 0.61–1.24)
GFR calc Af Amer: >60 mL/min (ref >60)
GFR calc non Af Amer: >60 mL/min (ref >60)
Glucose, Bld: 163 mg/dL — ABNORMAL HIGH (ref 70–99)
Potassium: 4.3 mmol/L (ref 3.5–5.1)
Sodium: 139 mmol/L (ref 135–145)
Total Bilirubin: 0.6 mg/dL (ref 0.3–1.2)
Total Protein: 8.7 g/dL — ABNORMAL HIGH (ref 6.5–8.1)

## 2019-09-30 LAB — IRON AND TIBC
Iron: 26 ug/dL — ABNORMAL LOW (ref 45–182)
Saturation Ratios: 9 % — ABNORMAL LOW (ref 17.9–39.5)
TIBC: 299 ug/dL (ref 250–450)
UIBC: 273 ug/dL

## 2019-09-30 LAB — VITAMIN B12: Vitamin B-12: 1038 pg/mL — ABNORMAL HIGH (ref 180–914)

## 2019-09-30 LAB — D-DIMER, QUANTITATIVE: D-Dimer, Quant: 0.98 ug/mL-FEU — ABNORMAL HIGH (ref 0.00–0.50)

## 2019-09-30 LAB — BRAIN NATRIURETIC PEPTIDE: B Natriuretic Peptide: 26 pg/mL (ref 0.0–100.0)

## 2019-09-30 LAB — RETICULOCYTES
Immature Retic Fract: 4.2 % (ref 2.3–15.9)
RBC.: 5.42 MIL/uL (ref 4.22–5.81)
Retic Ct Pct: <0.4 % — ABNORMAL LOW (ref 0.4–3.1)

## 2019-09-30 LAB — HEPATITIS PANEL, ACUTE
HCV Ab: NONREACTIVE
Hep A IgM: NONREACTIVE
Hep B C IgM: NONREACTIVE
Hepatitis B Surface Ag: NONREACTIVE

## 2019-09-30 LAB — FOLATE: Folate: 23.1 ng/mL (ref >5.9)

## 2019-09-30 LAB — MAGNESIUM: Magnesium: 2.6 mg/dL — ABNORMAL HIGH (ref 1.7–2.4)

## 2019-09-30 LAB — HIV ANTIBODY (ROUTINE TESTING W REFLEX): HIV Screen 4th Generation wRfx: NONREACTIVE

## 2019-09-30 LAB — C-REACTIVE PROTEIN: CRP: 20 mg/dL — ABNORMAL HIGH (ref <1.0)

## 2019-09-30 LAB — FERRITIN: Ferritin: 495 ng/mL — ABNORMAL HIGH (ref 24–336)

## 2019-09-30 MED ORDER — LOPERAMIDE HCL 2 MG PO CAPS
4.0000 mg | ORAL_CAPSULE | Freq: Once | ORAL | Status: AC
  Administered 2019-09-30: 4 mg via ORAL
  Filled 2019-09-30: qty 2

## 2019-09-30 NOTE — Plan of Care (Signed)

## 2019-09-30 NOTE — Progress Notes (Signed)
PROGRESS NOTE  Ricardo Lopez. PQD:[id number] DOB: Dec 13, 1975 DOA: 09/29/2019 PCP: [name], MD  Brief History:  44 year old male with a history of hypertension and morbid obesity presenting with shortness of breath that began on 09/26/2019.  He also complained of fatigue and myalgias with coughing some occasional yellow sputum.  He has also had fevers and chills.  He is not vaccinated for COVID-19.  Because of worsening shortness of breath, the patient presented for further evaluation.  He denies any nausea, vomiting, abdominal pain.  He did have some loose stools without hematochezia or melena.  He has had decreased oral intake. In the emergency department, the patient was febrile up to 1 to 3.4 F.  He was hemodynamically stable.  Oxygen saturation was in 80s on room air.  He was placed on 5 L nasal cannula.  The patient was started on steroids and remdesivir.  Chest x-ray showed bilateral patchy infiltrates.  Assessment/Plan: Acute respiratory failure secondary to COVID-19 pneumonia -Currently stable on 4-5 L -Wean oxygen as tolerated -CRP 19.3>> -Ferritin 392>> -D-dimer 1.17>> -PCT 0.25 -Follow blood cultures -Continue IV steroids and remdesivir -Continue baricitinib -Lay prone if tolerated as much as possible -Incentive spirometry  Acute kidney injury -Baseline creatinine 1.0-1.3 -Secondary to volume depletion -Patient was fluid resuscitated -A.m. BMP  Microcytic anemia -Follow-up iron studies  Morbid obesity -BMI 59.04 -Lifestyle modification -At risk for complications secondary to COVID-19  Transaminasemia -Follow-up viral hepatitis panel -Secondary to COVID-19 infection -No abdominal pain -Tolerating diet     Status is: Inpatient  Remains inpatient appropriate because:IV treatments appropriate due to intensity of illness or inability to take PO   Dispo: The patient is from: Home              Anticipated d/c is to: Home               Anticipated d/c date is: 2 days              Patient currently is not medically stable to d/c.        Family Communication:   No Family at bedside  Consultants:  none  Code Status:  FULL  DVT Prophylaxis:   [place] Lovenox   Procedures: As Listed in Progress Note Above  Antibiotics: None       Subjective: Patient continues to have shortness of breath but better than yesterday.  He has nonproductive cough right now.  He denies hemoptysis.  Denies any fevers, chills, chest pain, nausea, vomiting.  Diarrhea is improving.  He denies any abdominal pain.  Objective: Vitals:   09/29/19 2043 09/30/19 0029 09/30/19 0618 09/30/19 0641  BP: 126/78 (!) 101/57 (!) 100/46 112/62  Pulse: 89 83 80   Resp: 16 18 18    Temp: 99.6 F (37.6 C)  98.7 F (37.1 C)   TempSrc: Oral  Oral   SpO2: 93% 93% 96%   Weight:      Height:        Intake/Output Summary (Last 24 hours) at 09/30/2019 0914 Last data filed at 09/30/2019 0200 Gross per 24 hour  Intake 1155.16 ml  Output --  Net 1155.16 ml   Weight change:  Exam:   General:  Pt is alert, follows commands appropriately, not in acute distress  HEENT: No icterus, No thrush, No neck mass, [place]/AT  Cardiovascular: RRR, S1/S2, no rubs, no gallops  Respiratory: Bibasilar crackles but no wheezing.  Good air movement  Abdomen: Soft/+BS, non tender, non distended, no guarding  Extremities: trace LE edema, No lymphangitis, No petechiae, No rashes, no synovitis   Data Reviewed: I have personally reviewed following labs and imaging studies Basic Metabolic Panel: Recent Labs  Lab 09/29/19 1514  NA 136  K 4.0  CL 98  CO2 24  GLUCOSE 128*  BUN 19  CREATININE 1.65*  CALCIUM 8.9   Liver Function Tests: Recent Labs  Lab 09/29/19 1514  AST 142*  ALT 115*  ALKPHOS 62  BILITOT 1.0  PROT 9.2*  ALBUMIN 3.8   No results for input(s): LIPASE, AMYLASE in the last 168 hours. No results for input(s): AMMONIA in the last 168  hours. Coagulation Profile: No results for input(s): INR, PROTIME in the last 168 hours. CBC: Recent Labs  Lab 09/29/19 1514  WBC 7.7  NEUTROABS 6.8  HGB 11.4*  HCT 37.9*  MCV 67.3*  PLT 236   Cardiac Enzymes: No results for input(s): CKTOTAL, CKMB, CKMBINDEX, TROPONINI in the last 168 hours. BNP: Invalid input(s): POCBNP CBG: No results for input(s): GLUCAP in the last 168 hours. HbA1C: No results for input(s): HGBA1C in the last 72 hours. Urine analysis: No results found for: COLORURINE, APPEARANCEUR, LABSPEC, PHURINE, GLUCOSEU, HGBUR, BILIRUBINUR, KETONESUR, PROTEINUR, UROBILINOGEN, NITRITE, LEUKOCYTESUR Sepsis Labs: @LABRCNTIP (procalcitonin:4,lacticidven:4) ) Recent Results (from the past 240 hour(s))  SARS Coronavirus 2 by RT PCR (hospital order, performed in [facility] hospital lab) Nasopharyngeal Nasopharyngeal Swab     Status: Abnormal   Collection Time: 09/29/19  2:23 PM   Specimen: Nasopharyngeal Swab  Result Value Ref Range Status   SARS Coronavirus 2 POSITIVE (A) NEGATIVE Final    Comment: RESULT CALLED TO, READ BACK BY AND VERIFIED WITH: HOLCUM @ 1715 ON 11/29/19 BY HENDERSON L (NOTE) SARS-CoV-2 target nucleic acids are DETECTED  SARS-CoV-2 RNA is generally detectable in upper respiratory specimens  during the acute phase of infection.  Positive results are indicative  of the presence of the identified virus, but do not rule out bacterial infection or co-infection with other pathogens not detected by the test.  Clinical correlation with patient history and  other diagnostic information is necessary to determine patient infection status.  The expected result is negative.  Fact Sheet for Patients:   009381   Fact Sheet for Healthcare Providers:   BoilerBrush.com.cy    This test is not yet approved or cleared by the https://pope.com/ FDA and  has been authorized for detection and/or diagnosis of  SARS-CoV-2 by FDA under an Emergency Use Authorization (EUA).  This EUA will remain in effect (meaning thi s test can be used) for the duration of  the COVID-19 declaration under Section 564(b)(1) of the Act, 21 U.S.C. section 360-bbb-3(b)(1), unless the authorization is terminated or revoked sooner.  Performed at [facility], [address], [place], [place]      Scheduled Meds: . vitamin C  500 mg Oral Daily  . baricitinib  4 mg Oral Daily  . enoxaparin (LOVENOX) injection  80 mg Subcutaneous Q24H  . famotidine  20 mg Oral Daily  . methylPREDNISolone (SOLU-MEDROL) injection  80 mg Intravenous Q12H  . zinc sulfate  220 mg Oral Daily   Continuous Infusions: . sodium chloride 75 mL/hr at 09/29/19 1953  . sodium chloride 250 mL (09/29/19 1845)  . remdesivir 100 mg in NS 100 mL      Procedures/Studies: DG Chest Port 1 View  Result Date: 09/29/2019 CLINICAL DATA:  Cough and shortness of breath.  Diffuse pain. EXAM: PORTABLE CHEST 1 VIEW COMPARISON:  None. FINDINGS: Borderline cardiomegaly. Heterogeneous bilateral lung opacities. No pneumothorax or large pleural effusion. Degenerative change of both acromioclavicular joints. No evidence of acute osseous abnormality. IMPRESSION: Heterogeneous bilateral lung opacities, suspicious for multifocal pneumonia, including COVID-19. Electronically Signed   By: Narda Rutherford M.D.   On: 09/29/2019 15:05    Catarina Hartshorn, DO  [facility]  If 7PM-7AM, please contact night-coverage [email or web address] Password [email or web address] 09/30/2019, 9:14 AM   LOS: 1 day

## 2019-10-01 LAB — CBC WITH DIFFERENTIAL/PLATELET
Abs Immature Granulocytes: 0.05 10*3/uL (ref 0.00–0.07)
Basophils Absolute: 0 10*3/uL (ref 0.0–0.1)
Basophils Relative: 0 %
Eosinophils Absolute: 0 10*3/uL (ref 0.0–0.5)
Eosinophils Relative: 0 %
HCT: 37.9 % — ABNORMAL LOW (ref 39.0–52.0)
Hemoglobin: 11.1 g/dL — ABNORMAL LOW (ref 13.0–17.0)
Immature Granulocytes: 0 %
Lymphocytes Relative: 7 %
Lymphs Abs: 0.9 10*3/uL (ref 0.7–4.0)
MCH: 20.1 pg — ABNORMAL LOW (ref 26.0–34.0)
MCHC: 29.3 g/dL — ABNORMAL LOW (ref 30.0–36.0)
MCV: 68.8 fL — ABNORMAL LOW (ref 80.0–100.0)
Monocytes Absolute: 0.8 10*3/uL (ref 0.1–1.0)
Monocytes Relative: 7 %
Neutro Abs: 10.5 10*3/uL — ABNORMAL HIGH (ref 1.7–7.7)
Neutrophils Relative %: 86 %
Platelets: 291 10*3/uL (ref 150–400)
RBC: 5.51 MIL/uL (ref 4.22–5.81)
RDW: 15.9 % — ABNORMAL HIGH (ref 11.5–15.5)
WBC: 12.3 10*3/uL — ABNORMAL HIGH (ref 4.0–10.5)
nRBC: 0 % (ref 0.0–0.2)

## 2019-10-01 LAB — COMPREHENSIVE METABOLIC PANEL
ALT: 210 U/L — ABNORMAL HIGH (ref 0–44)
AST: 261 U/L — ABNORMAL HIGH (ref 15–41)
Albumin: 3.1 g/dL — ABNORMAL LOW (ref 3.5–5.0)
Alkaline Phosphatase: 52 U/L (ref 38–126)
Anion gap: 10 (ref 5–15)
BUN: 22 mg/dL — ABNORMAL HIGH (ref 6–20)
CO2: 28 mmol/L (ref 22–32)
Calcium: 9.2 mg/dL (ref 8.9–10.3)
Chloride: 101 mmol/L (ref 98–111)
Creatinine, Ser: 1.19 mg/dL (ref 0.61–1.24)
GFR calc Af Amer: >60 mL/min (ref >60)
GFR calc non Af Amer: >60 mL/min (ref >60)
Glucose, Bld: 164 mg/dL — ABNORMAL HIGH (ref 70–99)
Potassium: 5.1 mmol/L (ref 3.5–5.1)
Sodium: 139 mmol/L (ref 135–145)
Total Bilirubin: 0.6 mg/dL (ref 0.3–1.2)
Total Protein: 8.2 g/dL — ABNORMAL HIGH (ref 6.5–8.1)

## 2019-10-01 LAB — MAGNESIUM: Magnesium: 2.7 mg/dL — ABNORMAL HIGH (ref 1.7–2.4)

## 2019-10-01 LAB — C-REACTIVE PROTEIN: CRP: 9.4 mg/dL — ABNORMAL HIGH (ref <1.0)

## 2019-10-01 LAB — D-DIMER, QUANTITATIVE: D-Dimer, Quant: 0.72 ug/mL-FEU — ABNORMAL HIGH (ref 0.00–0.50)

## 2019-10-01 MED ORDER — TRAZODONE HCL 50 MG PO TABS
50.0000 mg | ORAL_TABLET | Freq: Once | ORAL | Status: AC
  Administered 2019-10-01: 50 mg via ORAL
  Filled 2019-10-01: qty 1

## 2019-10-01 MED ORDER — METHYLPREDNISOLONE SODIUM SUCC 125 MG IJ SOLR
125.0000 mg | Freq: Two times a day (BID) | INTRAMUSCULAR | Status: DC
  Administered 2019-10-01 – 2019-10-06 (×11): 125 mg via INTRAVENOUS
  Filled 2019-10-01 (×11): qty 2

## 2019-10-01 NOTE — Progress Notes (Addendum)
PROGRESS NOTE  [name] Ricardo Lopez:[id number] DOB: [birth year] DOA: 09/29/2019 PCP: [name], MD  Brief History:  44 year old male with a history of hypertension and morbid obesity presenting with shortness of breath that began on 09/26/2019.  He also complained of fatigue and myalgias with coughing some occasional yellow sputum.  He has also had fevers and chills.  He is not vaccinated for COVID-19.  Because of worsening shortness of breath, the patient presented for further evaluation.  He denies any nausea, vomiting, abdominal pain.  He did have some loose stools without hematochezia or melena.  He has had decreased oral intake. In the emergency department, the patient was febrile up to 1 to 3.4 F.  He was hemodynamically stable.  Oxygen saturation was in 80s on room air.  He was placed on 5 L nasal cannula.  The patient was started on steroids and remdesivir.  Chest x-ray showed bilateral patchy infiltrates.  Assessment/Plan: Acute respiratory failure secondary to COVID-19 pneumonia -Currently stable on 4-5 L>>6L -Wean oxygen as tolerated -CRP 19.3>>9.4 -Ferritin 392>> -D-dimer 1.17>>0.72 -PCT 0.25 -Follow blood cultures -Continue IV steroids and remdesivir -disContinue baricitinib due to rising LFTs -may need to d/c remdesivir if continued elevation -Lay prone if tolerated as much as possible -Incentive spirometry  Acute kidney injury -Baseline creatinine 1.0-1.3 -Secondary to volume depletion -Patient was fluid resuscitated-->improved -A.m. BMP   Microcytic anemia -iron saturation 9% -ferritin 495--difficult to interpret in setting of COVID 19 -total iron 26  Morbid obesity -BMI 59.04 -Lifestyle modification -At risk for complications secondary to COVID-19  Transaminasemia -Follow-up viral hepatitis panel--neg -Secondary to COVID-19 infection -No abdominal pain -Tolerating diet -d/c baricitinib and monitor -d/c remdesivir if continued rise  in am     Status is: Inpatient  Remains inpatient appropriate because:IV treatments appropriate due to intensity of illness or inability to take PO   Dispo: The patient is from: Home  Anticipated d/c is to: Home  Anticipated d/c date is: 2 days  Patient currently is not medically stable to d/c.        Family Communication:   No Family at bedside  Consultants:  none  Code Status:  FULL  DVT Prophylaxis:   [place] Lovenox   Procedures: As Listed in Progress Note Above  Antibiotics: None     Subjective: Patient states his breathing is a little better today.  He denies f/c, cp, n/v/d, abd pain.  Dry cough is a little better  Objective: Vitals:   09/30/19 1045 09/30/19 1409 09/30/19 2022 10/01/19 0447  BP: 125/73 (!) 147/86 (!) 143/77 122/84  Pulse: 83 83 90 81  Resp: 18 18 (!) 24 (!) 21  Temp: 99.5 F (37.5 C) 99.7 F (37.6 C) 98.9 F (37.2 C) 98.6 F (37 C)  TempSrc: Oral Oral    SpO2: 90% 91% 93% 95%  Weight:      Height:       No intake or output data in the 24 hours ending 10/01/19 1549 Weight change:  Exam:   General:  Pt is alert, follows commands appropriately, not in acute distress  HEENT: No icterus, No thrush, No neck mass, [place]/AT  Cardiovascular: RRR, S1/S2, no rubs, no gallops  Respiratory: diminished breath sounds. No wheeze  Abdomen: Soft/+BS, non tender, non distended, no guarding  Extremities: No edema, No lymphangitis, No petechiae, No rashes, no synovitis   Data Reviewed: I have personally reviewed following labs and imaging studies Basic  Metabolic Panel: Recent Labs  Lab 09/29/19 1514 09/30/19 0755 10/01/19 0817  NA 136 139 139  K 4.0 4.3 5.1  CL 98 99 101  CO2 24 26 28   GLUCOSE 128* 163* 164*  BUN 19 26* 22*  CREATININE 1.65* 1.40* 1.19  CALCIUM 8.9 9.1 9.2  MG  --  2.6* 2.7*   Liver Function Tests: Recent Labs  Lab 09/29/19 1514 09/30/19 0755  10/01/19 0817  AST 142* 101* 261*  ALT 115* 94* 210*  ALKPHOS 62 50 52  BILITOT 1.0 0.6 0.6  PROT 9.2* 8.7* 8.2*  ALBUMIN 3.8 3.3* 3.1*   No results for input(s): LIPASE, AMYLASE in the last 168 hours. No results for input(s): AMMONIA in the last 168 hours. Coagulation Profile: No results for input(s): INR, PROTIME in the last 168 hours. CBC: Recent Labs  Lab 09/29/19 1514 09/30/19 0755 10/01/19 0817  WBC 7.7 8.7 12.3*  NEUTROABS 6.8 7.5 10.5*  HGB 11.4* 10.7* 11.1*  HCT 37.9* 36.2* 37.9*  MCV 67.3* 68.0* 68.8*  PLT 236 253 291   Cardiac Enzymes: No results for input(s): CKTOTAL, CKMB, CKMBINDEX, TROPONINI in the last 168 hours. BNP: Invalid input(s): POCBNP CBG: No results for input(s): GLUCAP in the last 168 hours. HbA1C: No results for input(s): HGBA1C in the last 72 hours. Urine analysis: No results found for: COLORURINE, APPEARANCEUR, LABSPEC, PHURINE, GLUCOSEU, HGBUR, BILIRUBINUR, KETONESUR, PROTEINUR, UROBILINOGEN, NITRITE, LEUKOCYTESUR Sepsis Labs: @LABRCNTIP (procalcitonin:4,lacticidven:4) ) Recent Results (from the past 240 hour(s))  SARS Coronavirus 2 by RT PCR (hospital order, performed in [facility] hospital lab) Nasopharyngeal Nasopharyngeal Swab     Status: Abnormal   Collection Time: 09/29/19  2:23 PM   Specimen: Nasopharyngeal Swab  Result Value Ref Range Status   SARS Coronavirus 2 POSITIVE (A) NEGATIVE Final    Comment: RESULT CALLED TO, READ BACK BY AND VERIFIED WITH: HOLCUM @ 1715 ON [facility] BY HENDERSON L (NOTE) SARS-CoV-2 target nucleic acids are DETECTED  SARS-CoV-2 RNA is generally detectable in upper respiratory specimens  during the acute phase of infection.  Positive results are indicative  of the presence of the identified virus, but do not rule out bacterial infection or co-infection with other pathogens not detected by the test.  Clinical correlation with patient history and  other diagnostic information is necessary to determine  patient infection status.  The expected result is negative.  Fact Sheet for Patients:   11/29/19   Fact Sheet for Healthcare Providers:   960454    This test is not yet approved or cleared by the BoilerBrush.com.cy FDA and  has been authorized for detection and/or diagnosis of SARS-CoV-2 by FDA under an Emergency Use Authorization (EUA).  This EUA will remain in effect (meaning thi s test can be used) for the duration of  the COVID-19 declaration under Section 564(b)(1) of the Act, 21 U.S.C. section 360-bbb-3(b)(1), unless the authorization is terminated or revoked sooner.  Performed at [facility], [address], [place], [address] [place]   Blood Culture (routine x 2)     Status: None (Preliminary result)   Collection Time: 09/29/19  3:16 PM   Specimen: BLOOD  Result Value Ref Range Status   Specimen Description BLOOD LEFT ANTECUBITAL  Final   Special Requests   Final    BOTTLES DRAWN AEROBIC AND ANAEROBIC Blood Culture adequate volume   Culture   Final    NO GROWTH 2 DAYS Performed at [facility], [address] [place], [address] [place]    Report  Status PENDING  Incomplete  Blood Culture (routine x 2)     Status: None (Preliminary result)   Collection Time: 09/29/19  3:16 PM   Specimen: BLOOD  Result Value Ref Range Status   Specimen Description BLOOD LEFT ANTECUBITAL  Final   Special Requests   Final    BOTTLES DRAWN AEROBIC AND ANAEROBIC Blood Culture adequate volume   Culture   Final    NO GROWTH 2 DAYS Performed at [facility], [address], [place], [place] [address]    Report Status PENDING  Incomplete     Scheduled Meds: . vitamin C  500 mg Oral Daily  . baricitinib  4 mg Oral Daily  . enoxaparin (LOVENOX) injection  80 mg Subcutaneous Q24H  . famotidine  20 mg Oral Daily  . methylPREDNISolone (SOLU-MEDROL) injection  125 mg Intravenous Q12H  . zinc sulfate  220 mg Oral Daily    Continuous Infusions: . sodium chloride 250 mL (09/29/19 1845)  . remdesivir 100 mg in NS 100 mL 100 mg (10/01/19 0840)    Procedures/Studies: DG Chest Port 1 View  Result Date: 09/29/2019 CLINICAL DATA:  Cough and shortness of breath.  Diffuse pain. EXAM: PORTABLE CHEST 1 VIEW COMPARISON:  None. FINDINGS: Borderline cardiomegaly. Heterogeneous bilateral lung opacities. No pneumothorax or large pleural effusion. Degenerative change of both acromioclavicular joints. No evidence of acute osseous abnormality. IMPRESSION: Heterogeneous bilateral lung opacities, suspicious for multifocal pneumonia, including COVID-19. Electronically Signed   By: Narda Rutherford M.D.   On: 09/29/2019 15:05    Catarina Hartshorn, DO  [facility]  If 7PM-7AM, please contact night-coverage [email or web address] Password [email or web address] 10/01/2019, 3:49 PM   LOS: 2 days

## 2019-10-01 NOTE — Plan of Care (Signed)
  Problem: Education: Goal: Knowledge of General Education information will improve Description: Including pain rating scale, medication(s)/side effects and non-pharmacologic comfort measures Outcome: Progressing   Problem: Health Behavior/Discharge Planning: Goal: Ability to manage health-related needs will improve Outcome: Progressing   Problem: Clinical Measurements: Goal: Respiratory complications will improve Outcome: Progressing   Problem: Education: Goal: Knowledge of risk factors and measures for prevention of condition will improve Outcome: Progressing   Problem: Coping: Goal: Psychosocial and spiritual needs will be supported Outcome: Progressing

## 2019-10-01 NOTE — Plan of Care (Signed)
  Problem: Education: Goal: Knowledge of General Education information will improve Description: Including pain rating scale, medication(s)/side effects and non-pharmacologic comfort measures Outcome: Progressing   Problem: Health Behavior/Discharge Planning: Goal: Ability to manage health-related needs will improve Outcome: Progressing   Problem: Clinical Measurements: Goal: Respiratory complications will improve Outcome: Progressing   

## 2019-10-02 LAB — CBC WITH DIFFERENTIAL/PLATELET
Abs Immature Granulocytes: 0.11 10*3/uL — ABNORMAL HIGH (ref 0.00–0.07)
Basophils Absolute: 0 10*3/uL (ref 0.0–0.1)
Basophils Relative: 0 %
Eosinophils Absolute: 0 10*3/uL (ref 0.0–0.5)
Eosinophils Relative: 0 %
HCT: 40.7 % (ref 39.0–52.0)
Hemoglobin: 11.7 g/dL — ABNORMAL LOW (ref 13.0–17.0)
Immature Granulocytes: 1 %
Lymphocytes Relative: 8 %
Lymphs Abs: 1.1 10*3/uL (ref 0.7–4.0)
MCH: 19.8 pg — ABNORMAL LOW (ref 26.0–34.0)
MCHC: 28.7 g/dL — ABNORMAL LOW (ref 30.0–36.0)
MCV: 69 fL — ABNORMAL LOW (ref 80.0–100.0)
Monocytes Absolute: 1.1 10*3/uL — ABNORMAL HIGH (ref 0.1–1.0)
Monocytes Relative: 8 %
Neutro Abs: 11.9 10*3/uL — ABNORMAL HIGH (ref 1.7–7.7)
Neutrophils Relative %: 83 %
Platelets: 323 10*3/uL (ref 150–400)
RBC: 5.9 MIL/uL — ABNORMAL HIGH (ref 4.22–5.81)
RDW: 15.6 % — ABNORMAL HIGH (ref 11.5–15.5)
WBC: 14.3 10*3/uL — ABNORMAL HIGH (ref 4.0–10.5)
nRBC: 0 % (ref 0.0–0.2)

## 2019-10-02 LAB — COMPREHENSIVE METABOLIC PANEL
ALT: 336 U/L — ABNORMAL HIGH (ref 0–44)
AST: 289 U/L — ABNORMAL HIGH (ref 15–41)
Albumin: 3.2 g/dL — ABNORMAL LOW (ref 3.5–5.0)
Alkaline Phosphatase: 61 U/L (ref 38–126)
Anion gap: 13 (ref 5–15)
BUN: 23 mg/dL — ABNORMAL HIGH (ref 6–20)
CO2: 29 mmol/L (ref 22–32)
Calcium: 9.5 mg/dL (ref 8.9–10.3)
Chloride: 103 mmol/L (ref 98–111)
Creatinine, Ser: 1.2 mg/dL (ref 0.61–1.24)
GFR calc Af Amer: >60 mL/min (ref >60)
GFR calc non Af Amer: >60 mL/min (ref >60)
Glucose, Bld: 169 mg/dL — ABNORMAL HIGH (ref 70–99)
Potassium: 4.7 mmol/L (ref 3.5–5.1)
Sodium: 145 mmol/L (ref 135–145)
Total Bilirubin: 1 mg/dL (ref 0.3–1.2)
Total Protein: 8.6 g/dL — ABNORMAL HIGH (ref 6.5–8.1)

## 2019-10-02 LAB — MAGNESIUM: Magnesium: 2.7 mg/dL — ABNORMAL HIGH (ref 1.7–2.4)

## 2019-10-02 LAB — D-DIMER, QUANTITATIVE: D-Dimer, Quant: 1.15 ug/mL-FEU — ABNORMAL HIGH (ref 0.00–0.50)

## 2019-10-02 LAB — C-REACTIVE PROTEIN: CRP: 7.5 mg/dL — ABNORMAL HIGH (ref <1.0)

## 2019-10-02 LAB — FERRITIN: Ferritin: 699 ng/mL — ABNORMAL HIGH (ref 24–336)

## 2019-10-02 MED ORDER — TRAZODONE HCL 50 MG PO TABS
50.0000 mg | ORAL_TABLET | Freq: Once | ORAL | Status: AC
  Administered 2019-10-02: 50 mg via ORAL
  Filled 2019-10-02: qty 1

## 2019-10-02 MED ORDER — MENTHOL 3 MG MT LOZG
1.0000 | LOZENGE | Freq: Four times a day (QID) | OROMUCOSAL | Status: AC | PRN
  Filled 2019-10-02: qty 9

## 2019-10-02 NOTE — Progress Notes (Signed)
MD notified of pt request for throat lozenge or cough drops

## 2019-10-02 NOTE — Progress Notes (Addendum)
PROGRESS NOTE  [place]ardo Lopez. IRW:[id number] DOB: [birth year] [place] Lopez: [place] Brooks, MD  Brief History: 44 year old male with a history of hypertension and morbid obesity presenting with shortness of breath that began on 09/26/2019. He also complained of fatigue and myalgias with coughing some occasional yellow sputum. He has also had fevers and chills. He is not vaccinated for COVID-19. Because of worsening shortness of breath, the patient presented for further evaluation. He denies any nausea, vomiting, abdominal pain. He did have some loose stools without hematochezia or melena. He has had decreased oral intake. In the emergency department, the patient was febrile up to 1 to 3.4 F. He was hemodynamically stable. Oxygen saturation was in 80s on room air. He was placed on 5 L nasal cannula. The patient was started on steroids and remdesivir. Chest x-ray showed bilateral patchy infiltrates.  Assessment/Plan: Acute respiratory failure secondary to COVID-19 pneumonia -Currently stable on 4-5 L>>6L>>8L -Wean oxygen as tolerated -CRP 19.3>>9.4>>7.5 -Ferritin 392>>699 -D-dimer 1.17>>0.72>>1.15 -PCT 0.25 -Follow blood cultures -Continue IV steroids -d/c remdesivir due to rising LFTs -disContinue baricitinib due to rising LFTs -may need to d/c remdesivir if continued elevation -Lay prone if tolerated as much as possible -Incentive spirometry -encouraged to lay prone  Acute kidney injury -Baseline creatinine 1.0-1.3 -Secondary to volume depletion -Patient was fluid resuscitated-->improved -A.m. BMP   Microcytic anemia -iron saturation 9% -ferritin 495--difficult to interpret in setting of COVID 19 -total iron 26  Morbid obesity -BMI 59.04 -Lifestyle modification -At risk for complications secondary to COVID-19  Transaminasemia -Follow-up viral hepatitis panel--neg -Secondary to COVID-19 infection -No abdominal pain -Tolerating  diet -d/c baricitinib and monitor -d/c remdesivir  -am LFTs     Status is: Inpatient  Remains inpatient appropriate because:IV treatments appropriate due to intensity of illness or inability to take PO   Dispo: The patient is from:Home Anticipated d/c is PY:PPJK Anticipated d/c date is: 2 days Patient currently is not medically stable to d/c.        Family Communication:NoFamily at bedside  Consultants:none  Code Status: FULL  DVT Prophylaxis: [place] Lovenox   Procedures: As Listed in Progress Note Above  Antibiotics: None   Subjective: Overall patient states he is slowly improving.  Still has dyspnea on exertion but feels his sob is a little better.  Denies f/c, cp, n/v/d, abd pain  Objective: Vitals:   10/01/19 1600 10/01/19 2208 10/02/19 0102 10/02/19 0450  BP: (!) 165/96 (!) 152/88  (!) 141/81  Pulse: 86 85 81 83  Resp: 20 (!) 22  20  Temp: 98.5 F (36.9 C) 98.5 F (36.9 C)  98.6 F (37 C)  TempSrc: Oral     SpO2: (!) 88% (!) 88% 94% (!) 86%  Weight:      Height:       No intake or output data in the 24 hours ending 10/02/19 1036 Weight change:  Exam:   General:  Pt is alert, follows commands appropriately, not in acute distress  HEENT: No icterus, No thrush, No neck mass, [place]/AT  Cardiovascular: RRR, S1/S2, no rubs, no gallops  Respiratory: bibasilar rales.n o wheeze  Abdomen: Soft/+BS, non tender, non distended, no guarding  Extremities: Non pitting edema, No lymphangitis, No petechiae, No rashes, no synovitis   Data Reviewed: I have personally reviewed following labs and imaging studies Basic Metabolic Panel: Recent Labs  Lab 09/29/19 1514 09/30/19 0755 10/01/19 0817 10/02/19 0727  NA 136 139 139  145  K 4.0 4.3 5.1 4.7  CL 98 99 101 103  CO2 24 26 28 29   GLUCOSE 128* 163* 164* 169*  BUN 19 26* 22* 23*  CREATININE 1.65* 1.40* 1.19 1.20  CALCIUM 8.9 9.1  9.2 9.5  MG  --  2.6* 2.7* 2.7*   Liver Function Tests: Recent Labs  Lab 09/29/19 1514 09/30/19 0755 10/01/19 0817 10/02/19 0727  AST 142* 101* 261* 289*  ALT 115* 94* 210* 336*  ALKPHOS 62 50 52 61  BILITOT 1.0 0.6 0.6 1.0  PROT 9.2* 8.7* 8.2* 8.6*  ALBUMIN 3.8 3.3* 3.1* 3.2*   No results for input(s): LIPASE, AMYLASE in the last 168 hours. No results for input(s): AMMONIA in the last 168 hours. Coagulation Profile: No results for input(s): INR, PROTIME in the last 168 hours. CBC: Recent Labs  Lab 09/29/19 1514 09/30/19 0755 10/01/19 0817 10/02/19 0727  WBC 7.7 8.7 12.3* 14.3*  NEUTROABS 6.8 7.5 10.5* 11.9*  HGB 11.4* 10.7* 11.1* 11.7*  HCT 37.9* 36.2* 37.9* 40.7  MCV 67.3* 68.0* 68.8* 69.0*  PLT 236 253 291 323   Cardiac Enzymes: No results for input(s): CKTOTAL, CKMB, CKMBINDEX, TROPONINI in the last 168 hours. BNP: Invalid input(s): POCBNP CBG: No results for input(s): GLUCAP in the last 168 hours. HbA1C: No results for input(s): HGBA1C in the last 72 hours. Urine analysis: No results found for: COLORURINE, APPEARANCEUR, LABSPEC, PHURINE, GLUCOSEU, HGBUR, BILIRUBINUR, KETONESUR, PROTEINUR, UROBILINOGEN, NITRITE, LEUKOCYTESUR Sepsis Labs: @LABRCNTIP (procalcitonin:4,lacticidven:4) ) Recent Results (from the past 240 hour(s))  SARS Coronavirus 2 by RT PCR (hospital order, performed in [facility] hospital lab) Nasopharyngeal Nasopharyngeal Swab     Status: Abnormal   Collection Time: 09/29/19  2:23 PM   Specimen: Nasopharyngeal Swab  Result Value Ref Range Status   SARS Coronavirus 2 POSITIVE (A) NEGATIVE Final    Comment: RESULT CALLED TO, READ BACK BY AND VERIFIED WITH: HOLCUM @ 1715 ON [facility] BY HENDERSON L (NOTE) SARS-CoV-2 target nucleic acids are DETECTED  SARS-CoV-2 RNA is generally detectable in upper respiratory specimens  during the acute phase of infection.  Positive results are indicative  of the presence of the identified virus, but do not  rule out bacterial infection or co-infection with other pathogens not detected by the test.  Clinical correlation with patient history and  other diagnostic information is necessary to determine patient infection status.  The expected result is negative.  Fact Sheet for Patients:   11/29/19   Fact Sheet for Healthcare Providers:   892119    This test is not yet approved or cleared by the [email or web address] FDA and  has been authorized for detection and/or diagnosis of SARS-CoV-2 by FDA under an Emergency Use Authorization (EUA).  This EUA will remain in effect (meaning thi s test can be used) for the duration of  the COVID-19 declaration under Section 564(b)(1) of the Act, 21 U.S.C. section 360-bbb-3(b)(1), unless the authorization is terminated or revoked sooner.  Performed at [facility], [address], [place], [address] [place]   Blood Culture (routine x 2)     Status: None (Preliminary result)   Collection Time: 09/29/19  3:16 PM   Specimen: BLOOD  Result Value Ref Range Status   Specimen Description BLOOD LEFT ANTECUBITAL  Final   Special Requests   Final    BOTTLES DRAWN AEROBIC AND ANAEROBIC Blood Culture adequate volume   Culture   Final    NO GROWTH 2 DAYS Performed at [facility], [address] [place], [place] [address]    Report Status PENDING  Incomplete  Blood Culture (routine x 2)     Status: None (Preliminary result)   Collection Time: 09/29/19  3:16 PM   Specimen: BLOOD  Result Value Ref Range Status   Specimen Description BLOOD LEFT ANTECUBITAL  Final   Special Requests   Final    BOTTLES DRAWN AEROBIC AND ANAEROBIC Blood Culture adequate volume   Culture   Final    NO GROWTH 2 DAYS Performed at [facility], [address], [place], [place] [address]    Report Status PENDING  Incomplete     Scheduled Meds: . vitamin C  500 mg Oral Daily  . enoxaparin (LOVENOX) injection  80 mg  Subcutaneous Q24H  . famotidine  20 mg Oral Daily  . methylPREDNISolone (SOLU-MEDROL) injection  125 mg Intravenous Q12H  . zinc sulfate  220 mg Oral Daily   Continuous Infusions: . sodium chloride 250 mL (09/29/19 1845)    Procedures/Studies: DG Chest Port 1 View  Result Date: 09/29/2019 CLINICAL DATA:  Cough and shortness of breath.  Diffuse pain. EXAM: PORTABLE CHEST 1 VIEW COMPARISON:  None. FINDINGS: Borderline cardiomegaly. Heterogeneous bilateral lung opacities. No pneumothorax or large pleural effusion. Degenerative change of both acromioclavicular joints. No evidence of acute osseous abnormality. IMPRESSION: Heterogeneous bilateral lung opacities, suspicious for multifocal pneumonia, including COVID-19. Electronically Signed   By: Narda Rutherford M.D.   On: 09/29/2019 15:05    Catarina Hartshorn, DO  [facility]  If 7PM-7AM, please contact night-coverage [email or web address] Password [email or web address] 10/02/2019, 10:36 AM   LOS: 3 days

## 2019-10-02 NOTE — Plan of Care (Signed)
  Problem: Education: Goal: Knowledge of General Education information will improve Description: Including pain rating scale, medication(s)/side effects and non-pharmacologic comfort measures Outcome: Progressing   Problem: Health Behavior/Discharge Planning: Goal: Ability to manage health-related needs will improve Outcome: Progressing   Problem: Education: Goal: Knowledge of risk factors and measures for prevention of condition will improve Outcome: Progressing   Problem: Coping: Goal: Psychosocial and spiritual needs will be supported Outcome: Progressing   

## 2019-10-03 ENCOUNTER — Inpatient Hospital Stay (HOSPITAL_COMMUNITY): Payer: 59

## 2019-10-03 LAB — CBC WITH DIFFERENTIAL/PLATELET
Abs Immature Granulocytes: 0.32 10*3/uL — ABNORMAL HIGH (ref 0.00–0.07)
Basophils Absolute: 0.1 10*3/uL (ref 0.0–0.1)
Basophils Relative: 0 %
Eosinophils Absolute: 0.1 10*3/uL (ref 0.0–0.5)
Eosinophils Relative: 0 %
HCT: 40.5 % (ref 39.0–52.0)
Hemoglobin: 11.9 g/dL — ABNORMAL LOW (ref 13.0–17.0)
Immature Granulocytes: 2 %
Lymphocytes Relative: 9 %
Lymphs Abs: 1.5 10*3/uL (ref 0.7–4.0)
MCH: 20.2 pg — ABNORMAL LOW (ref 26.0–34.0)
MCHC: 29.4 g/dL — ABNORMAL LOW (ref 30.0–36.0)
MCV: 68.6 fL — ABNORMAL LOW (ref 80.0–100.0)
Monocytes Absolute: 1.7 10*3/uL — ABNORMAL HIGH (ref 0.1–1.0)
Monocytes Relative: 10 %
Neutro Abs: 13.3 10*3/uL — ABNORMAL HIGH (ref 1.7–7.7)
Neutrophils Relative %: 79 %
Platelets: 359 10*3/uL (ref 150–400)
RBC: 5.9 MIL/uL — ABNORMAL HIGH (ref 4.22–5.81)
RDW: 16.7 % — ABNORMAL HIGH (ref 11.5–15.5)
WBC: 16.9 10*3/uL — ABNORMAL HIGH (ref 4.0–10.5)
nRBC: 0 % (ref 0.0–0.2)

## 2019-10-03 LAB — COMPREHENSIVE METABOLIC PANEL
ALT: 268 U/L — ABNORMAL HIGH (ref 0–44)
AST: 111 U/L — ABNORMAL HIGH (ref 15–41)
Albumin: 3.1 g/dL — ABNORMAL LOW (ref 3.5–5.0)
Alkaline Phosphatase: 59 U/L (ref 38–126)
Anion gap: 12 (ref 5–15)
BUN: 22 mg/dL — ABNORMAL HIGH (ref 6–20)
CO2: 28 mmol/L (ref 22–32)
Calcium: 9.3 mg/dL (ref 8.9–10.3)
Chloride: 101 mmol/L (ref 98–111)
Creatinine, Ser: 1.08 mg/dL (ref 0.61–1.24)
GFR calc Af Amer: >60 mL/min (ref >60)
GFR calc non Af Amer: >60 mL/min (ref >60)
Glucose, Bld: 160 mg/dL — ABNORMAL HIGH (ref 70–99)
Potassium: 4.6 mmol/L (ref 3.5–5.1)
Sodium: 141 mmol/L (ref 135–145)
Total Bilirubin: 0.5 mg/dL (ref 0.3–1.2)
Total Protein: 8.3 g/dL — ABNORMAL HIGH (ref 6.5–8.1)

## 2019-10-03 LAB — FERRITIN: Ferritin: 609 ng/mL — ABNORMAL HIGH (ref 24–336)

## 2019-10-03 LAB — MAGNESIUM: Magnesium: 2.5 mg/dL — ABNORMAL HIGH (ref 1.7–2.4)

## 2019-10-03 LAB — C-REACTIVE PROTEIN: CRP: 9.3 mg/dL — ABNORMAL HIGH (ref <1.0)

## 2019-10-03 LAB — D-DIMER, QUANTITATIVE: D-Dimer, Quant: 0.87 ug/mL-FEU — ABNORMAL HIGH (ref 0.00–0.50)

## 2019-10-03 MED ORDER — IOHEXOL 350 MG/ML SOLN
100.0000 mL | Freq: Once | INTRAVENOUS | Status: AC | PRN
  Administered 2019-10-03: 100 mL via INTRAVENOUS

## 2019-10-03 MED ORDER — LORAZEPAM 2 MG/ML IJ SOLN: 1.0000 mg | Freq: Four times a day (QID) | INTRAMUSCULAR | Status: DC | PRN

## 2019-10-03 MED ORDER — CALCIUM CARBONATE ANTACID 500 MG PO CHEW
2.0000 | CHEWABLE_TABLET | Freq: Four times a day (QID) | ORAL | Status: DC | PRN
  Administered 2019-10-03 – 2019-10-06 (×4): 400 mg via ORAL
  Filled 2019-10-03 (×4): qty 2

## 2019-10-03 MED ORDER — LORAZEPAM 1 MG PO TABS
1.0000 mg | ORAL_TABLET | Freq: Four times a day (QID) | ORAL | Status: DC | PRN
  Administered 2019-10-03 – 2019-10-06 (×4): 1 mg via ORAL
  Filled 2019-10-03 (×4): qty 1

## 2019-10-03 NOTE — Plan of Care (Signed)
  Problem: Education: Goal: Knowledge of General Education information will improve Description: Including pain rating scale, medication(s)/side effects and non-pharmacologic comfort measures Outcome: Progressing   Problem: Health Behavior/Discharge Planning: Goal: Ability to manage health-related needs will improve Outcome: Progressing   Problem: Pain Managment: Goal: General experience of comfort will improve Outcome: Progressing   Problem: Safety: Goal: Ability to remain free from injury will improve Outcome: Progressing   Problem: Skin Integrity: Goal: Risk for impaired skin integrity will decrease Outcome: Progressing   Problem: Respiratory: Goal: Complications related to the disease process, condition or treatment will be avoided or minimized Outcome: Progressing

## 2019-10-03 NOTE — Progress Notes (Signed)
Patient on 12L HFNC, O2 sat 86% lying prone, Resp Rate 30, HR 103.  Provider (Dr. Arbutus Leas) notified.  Was instructed to monitor patient.  Patient is comfortable and doesn't appear to be in respiratory distress.  Increased HFNC to 15 L, O2 sat increased to 90%.

## 2019-10-03 NOTE — Progress Notes (Signed)
PROGRESS NOTE  Ricardo Lopez. TSV:[id number] DOB: November 15, 1975 DOA: 09/29/2019 PCP: [name], MD  Brief History: 44 year old male with a history of hypertension and morbid obesity presenting with shortness of breath that began on 09/26/2019. He also complained of fatigue and myalgias with coughing some occasional yellow sputum. He has also had fevers and chills. He is not vaccinated for COVID-19. Because of worsening shortness of breath, the patient presented for further evaluation. He denies any nausea, vomiting, abdominal pain. He did have some loose stools without hematochezia or melena. He has had decreased oral intake. In the emergency department, the patient was febrile up to 1 to 3.4 F. He was hemodynamically stable. Oxygen saturation was in 80s on room air. He was placed on 5 L nasal cannula. The patient was started on steroids and remdesivir. Chest x-ray showed bilateral patchy infiltrates.  Assessment/Plan: Acute respiratory failure secondary to COVID-19 pneumonia -Currently stable on 4-5 L>>6L>>10L -saturations 90-92% when prone -Wean oxygen as tolerated -CRP 19.3>>9.4>>7.5>>9.3 -Ferritin 392>>699>>609 -D-dimer 1.17>>0.72>>1.15>>0.87 -PCT 0.25 -Follow blood cultures--neg to date -Continue IV steroids -d/c remdesivir due to rising LFTs -disContinue baricitinibdue to rising LFTs -Lay prone if tolerated as much as possible -Incentive spirometry -continue vitamin C and Zinc -CTA chest if able  Acute kidney injury -Baseline creatinine 1.0-1.3 -Secondary to volume depletion -Patient was fluid resuscitated-->improved -A.m. BMP   Microcytic anemia -iron saturation 9% -ferritin 495--difficult to interpret in setting of COVID 19 -total iron 26  Morbid obesity -BMI 59.04 -Lifestyle modification -At risk for complications secondary to COVID-19  Transaminasemia -Follow-up viral hepatitis panel--neg -Secondary to COVID-19 infection -No  abdominal pain -Tolerating diet -d/c baricitinib and monitor -d/c remdesivir  -am LFTs     Status is: Inpatient  Remains inpatient appropriate because:IV treatments appropriate due to intensity of illness or inability to take PO   Dispo: The patient is from:Home Anticipated d/c is PQ:ZRAQ Anticipated d/c date is: 2 days Patient currently is not medically stable to d/c.        Family Communication:spouse updated  Consultants:none  Code Status: FULL  DVT Prophylaxis: [place] Lovenox   Procedures: As Listed in Progress Note Above  Antibiotics: None     Subjective: Patient states breathing is a little better than yesterday.  He has been able to lie prone.  He still remains dyspneic with exertion.  Denies, f/c, cp, n/v/d, abd pain.  Objective: Vitals:   10/03/19 0514 10/03/19 1000 10/03/19 1045 10/03/19 1245  BP: (!) 145/69   (!) 153/97  Pulse: 90  (!) 103 (!) 101  Resp: 16  (!) 30 (!) 24  Temp:    98.4 F (36.9 C)  TempSrc:    Oral  SpO2: 90% 92% (!) 86% 91%  Weight:      Height:       No intake or output data in the 24 hours ending 10/03/19 1359 Weight change:  Exam:   General:  Pt is alert, follows commands appropriately, not in acute distress  HEENT: No icterus, No thrush, No neck mass, [place]/AT  Cardiovascular: RRR, S1/S2, no rubs, no gallops  Respiratory: diminished BS.  Bibasilar rales. No wheeze  Abdomen: Soft/+BS, non tender, non distended, no guarding  Extremities: Non pitting edema, No lymphangitis, No petechiae, No rashes, no synovitis   Data Reviewed: I have personally reviewed following labs and imaging studies Basic Metabolic Panel: Recent Labs  Lab 09/29/19 1514 09/30/19 0755 10/01/19 0817 10/02/19 0727 10/03/19 0756  NA 136 139 139 145 141  K 4.0 4.3 5.1 4.7 4.6  CL 98 99 101 103 101  CO2 24 26 28 29 28   GLUCOSE 128* 163* 164* 169* 160*  BUN 19 26* 22*  23* 22*  CREATININE 1.65* 1.40* 1.19 1.20 1.08  CALCIUM 8.9 9.1 9.2 9.5 9.3  MG  --  2.6* 2.7* 2.7* 2.5*   Liver Function Tests: Recent Labs  Lab 09/29/19 1514 09/30/19 0755 10/01/19 0817 10/02/19 0727 10/03/19 0756  AST 142* 101* 261* 289* 111*  ALT 115* 94* 210* 336* 268*  ALKPHOS 62 50 52 61 59  BILITOT 1.0 0.6 0.6 1.0 0.5  PROT 9.2* 8.7* 8.2* 8.6* 8.3*  ALBUMIN 3.8 3.3* 3.1* 3.2* 3.1*   No results for input(s): LIPASE, AMYLASE in the last 168 hours. No results for input(s): AMMONIA in the last 168 hours. Coagulation Profile: No results for input(s): INR, PROTIME in the last 168 hours. CBC: Recent Labs  Lab 09/29/19 1514 09/30/19 0755 10/01/19 0817 10/02/19 0727 10/03/19 0756  WBC 7.7 8.7 12.3* 14.3* 16.9*  NEUTROABS 6.8 7.5 10.5* 11.9* 13.3*  HGB 11.4* 10.7* 11.1* 11.7* 11.9*  HCT 37.9* 36.2* 37.9* 40.7 40.5  MCV 67.3* 68.0* 68.8* 69.0* 68.6*  PLT 236 253 291 323 359   Cardiac Enzymes: No results for input(s): CKTOTAL, CKMB, CKMBINDEX, TROPONINI in the last 168 hours. BNP: Invalid input(s): POCBNP CBG: No results for input(s): GLUCAP in the last 168 hours. HbA1C: No results for input(s): HGBA1C in the last 72 hours. Urine analysis: No results found for: COLORURINE, APPEARANCEUR, LABSPEC, PHURINE, GLUCOSEU, HGBUR, BILIRUBINUR, KETONESUR, PROTEINUR, UROBILINOGEN, NITRITE, LEUKOCYTESUR Sepsis Labs: @LABRCNTIP (procalcitonin:4,lacticidven:4) ) Recent Results (from the past 240 hour(s))  SARS Coronavirus 2 by RT PCR (hospital order, performed in [facility] hospital lab) Nasopharyngeal Nasopharyngeal Swab     Status: Abnormal   Collection Time: 09/29/19  2:23 PM   Specimen: Nasopharyngeal Swab  Result Value Ref Range Status   SARS Coronavirus 2 POSITIVE (A) NEGATIVE Final    Comment: RESULT CALLED TO, READ BACK BY AND VERIFIED WITH: HOLCUM @ 1715 ON [facility] BY HENDERSON L (NOTE) SARS-CoV-2 target nucleic acids are DETECTED  SARS-CoV-2 RNA is generally  detectable in upper respiratory specimens  during the acute phase of infection.  Positive results are indicative  of the presence of the identified virus, but do not rule out bacterial infection or co-infection with other pathogens not detected by the test.  Clinical correlation with patient history and  other diagnostic information is necessary to determine patient infection status.  The expected result is negative.  Fact Sheet for Patients:   11/29/19   Fact Sheet for Healthcare Providers:   213086    This test is not yet approved or cleared by the [email or web address] FDA and  has been authorized for detection and/or diagnosis of SARS-CoV-2 by FDA under an Emergency Use Authorization (EUA).  This EUA will remain in effect (meaning thi s test can be used) for the duration of  the COVID-19 declaration under Section 564(b)(1) of the Act, 21 U.S.C. section 360-bbb-3(b)(1), unless the authorization is terminated or revoked sooner.  Performed at [facility] [address], [place], [address] [place]   Blood Culture (routine x 2)     Status: None (Preliminary result)   Collection Time: 09/29/19  3:16 PM   Specimen: BLOOD  Result Value Ref Range Status   Specimen Description BLOOD LEFT ANTECUBITAL  Final   Special Requests   Final  BOTTLES DRAWN AEROBIC AND ANAEROBIC Blood Culture adequate volume   Culture   Final    NO GROWTH 3 DAYS Performed at [facility], [address] [place], [place] [address]    Report Status PENDING  Incomplete  Blood Culture (routine x 2)     Status: None (Preliminary result)   Collection Time: 09/29/19  3:16 PM   Specimen: BLOOD  Result Value Ref Range Status   Specimen Description BLOOD LEFT ANTECUBITAL  Final   Special Requests   Final    BOTTLES DRAWN AEROBIC AND ANAEROBIC Blood Culture adequate volume   Culture   Final    NO GROWTH 3 DAYS Performed at [facility], [address] [place], [place] [address]    Report Status PENDING  Incomplete     Scheduled Meds: . vitamin C  500 mg Oral Daily  . enoxaparin (LOVENOX) injection  80 mg Subcutaneous Q24H  . famotidine  20 mg Oral Daily  . methylPREDNISolone (SOLU-MEDROL) injection  125 mg Intravenous Q12H  . zinc sulfate  220 mg Oral Daily   Continuous Infusions: . sodium chloride 250 mL (09/29/19 1845)    Procedures/Studies: DG Chest Port 1 View  Result Date: 09/29/2019 CLINICAL DATA:  Cough and shortness of breath.  Diffuse pain. EXAM: PORTABLE CHEST 1 VIEW COMPARISON:  None. FINDINGS: Borderline cardiomegaly. Heterogeneous bilateral lung opacities. No pneumothorax or large pleural effusion. Degenerative change of both acromioclavicular joints. No evidence of acute osseous abnormality. IMPRESSION: Heterogeneous bilateral lung opacities, suspicious for multifocal pneumonia, including COVID-19. Electronically Signed   By: Narda Rutherford M.D.   On: 09/29/2019 15:05    Catarina Hartshorn, DO  [facility]  If 7PM-7AM, please contact night-coverage [email or web address] Password [email or web address] 10/03/2019, 1:59 PM   LOS: 4 days

## 2019-10-03 NOTE — Progress Notes (Signed)
   10/03/19 1045  Assess: MEWS Score  Pulse Rate (!) 103  Resp (!) 30 (Provider notified!)  Level of Consciousness Alert  SpO2 (!) 86 %  O2 Device HFNC  O2 Flow Rate (L/min) 12 L/min  Assess: MEWS Score  MEWS Temp 0  MEWS Systolic 0  MEWS Pulse 1  MEWS RR 2  MEWS LOC 0  MEWS Score 3  MEWS Score Color Yellow  Assess: if the MEWS score is Yellow or Red  Were vital signs taken at a resting state? Yes  Focused Assessment Change from prior assessment (see assessment flowsheet)  Early Detection of Sepsis Score *See Row Information* Medium  MEWS guidelines implemented *See Row Information* Yes  Treat  MEWS Interventions Other (Comment) (Increased O2 to 15L HFNC)  Take Vital Signs  Increase Vital Sign Frequency  Yellow: Q 2hr X 2 then Q 4hr X 2, if remains yellow, continue Q 4hrs  Escalate  MEWS: Escalate Yellow: discuss with charge nurse/RN and consider discussing with provider and RRT  Notify: Charge Nurse/RN  Date Charge Nurse/RN Notified 10/03/19  Time Charge Nurse/RN Notified 1053  Notify: Provider  Provider Name/Title Dr. Arbutus Leas  Date Provider Notified 10/03/19  Time Provider Notified 1053  Notification Type Page  Notification Reason Change in status  Response Other (Comment) (Instructed to continue to monitor pt.)  Date of Provider Response 10/03/19  Time of Provider Response 1054  Document  Patient Outcome Other (Comment) (Patient stable and remains on department)  Progress note created (see row info) Yes

## 2019-10-04 LAB — CULTURE, BLOOD (ROUTINE X 2)
Culture: NO GROWTH
Culture: NO GROWTH
Special Requests: ADEQUATE
Special Requests: ADEQUATE

## 2019-10-04 LAB — CBC WITH DIFFERENTIAL/PLATELET
Abs Immature Granulocytes: 0.74 10*3/uL — ABNORMAL HIGH (ref 0.00–0.07)
Basophils Absolute: 0.1 10*3/uL (ref 0.0–0.1)
Basophils Relative: 0 %
Eosinophils Absolute: 0 10*3/uL (ref 0.0–0.5)
Eosinophils Relative: 0 %
HCT: 39.7 % (ref 39.0–52.0)
Hemoglobin: 11.6 g/dL — ABNORMAL LOW (ref 13.0–17.0)
Immature Granulocytes: 4 %
Lymphocytes Relative: 13 %
Lymphs Abs: 2.3 10*3/uL (ref 0.7–4.0)
MCH: 20.1 pg — ABNORMAL LOW (ref 26.0–34.0)
MCHC: 29.2 g/dL — ABNORMAL LOW (ref 30.0–36.0)
MCV: 68.8 fL — ABNORMAL LOW (ref 80.0–100.0)
Monocytes Absolute: 1.8 10*3/uL — ABNORMAL HIGH (ref 0.1–1.0)
Monocytes Relative: 10 %
Neutro Abs: 13 10*3/uL — ABNORMAL HIGH (ref 1.7–7.7)
Neutrophils Relative %: 73 %
Platelets: 423 10*3/uL — ABNORMAL HIGH (ref 150–400)
RBC: 5.77 MIL/uL (ref 4.22–5.81)
RDW: 16.3 % — ABNORMAL HIGH (ref 11.5–15.5)
WBC: 17.9 10*3/uL — ABNORMAL HIGH (ref 4.0–10.5)
nRBC: 0 % (ref 0.0–0.2)

## 2019-10-04 LAB — COMPREHENSIVE METABOLIC PANEL
ALT: 175 U/L — ABNORMAL HIGH (ref 0–44)
AST: 45 U/L — ABNORMAL HIGH (ref 15–41)
Albumin: 3.1 g/dL — ABNORMAL LOW (ref 3.5–5.0)
Alkaline Phosphatase: 53 U/L (ref 38–126)
Anion gap: 13 (ref 5–15)
BUN: 23 mg/dL — ABNORMAL HIGH (ref 6–20)
CO2: 27 mmol/L (ref 22–32)
Calcium: 9.4 mg/dL (ref 8.9–10.3)
Chloride: 101 mmol/L (ref 98–111)
Creatinine, Ser: 1.13 mg/dL (ref 0.61–1.24)
GFR calc Af Amer: >60 mL/min (ref >60)
GFR calc non Af Amer: >60 mL/min (ref >60)
Glucose, Bld: 187 mg/dL — ABNORMAL HIGH (ref 70–99)
Potassium: 4.5 mmol/L (ref 3.5–5.1)
Sodium: 141 mmol/L (ref 135–145)
Total Bilirubin: 0.5 mg/dL (ref 0.3–1.2)
Total Protein: 7.8 g/dL (ref 6.5–8.1)

## 2019-10-04 LAB — D-DIMER, QUANTITATIVE: D-Dimer, Quant: 0.76 ug/mL-FEU — ABNORMAL HIGH (ref 0.00–0.50)

## 2019-10-04 LAB — MAGNESIUM: Magnesium: 2.3 mg/dL (ref 1.7–2.4)

## 2019-10-04 LAB — FERRITIN: Ferritin: 484 ng/mL — ABNORMAL HIGH (ref 24–336)

## 2019-10-04 LAB — C-REACTIVE PROTEIN: CRP: 4.3 mg/dL — ABNORMAL HIGH (ref <1.0)

## 2019-10-04 MED ORDER — SODIUM CHLORIDE 0.9 % IV SOLN
100.0000 mg | Freq: Every day | INTRAVENOUS | Status: AC
  Administered 2019-10-04 – 2019-10-05 (×2): 100 mg via INTRAVENOUS
  Filled 2019-10-04 (×2): qty 20

## 2019-10-04 MED ORDER — FUROSEMIDE 10 MG/ML IJ SOLN
20.0000 mg | Freq: Once | INTRAMUSCULAR | Status: AC
  Administered 2019-10-04: 20 mg via INTRAVENOUS
  Filled 2019-10-04: qty 2

## 2019-10-04 NOTE — Plan of Care (Signed)

## 2019-10-04 NOTE — Progress Notes (Signed)
PROGRESS NOTE  Ricardo Lopez. LFY:[id number] DOB: 22-Mar-1975 Ricardo Lopez: 09/29/2019 PCP: [name], MD  Brief History: 44 year old male with a history of hypertension and morbid obesity presenting with shortness of breath that began on 09/26/2019. He also complained of fatigue and myalgias with coughing some occasional yellow sputum. He has also had fevers and chills. He is not vaccinated for COVID-19. Because of worsening shortness of breath, the patient presented for further evaluation. He denies any nausea, vomiting, abdominal pain. He did have some loose stools without hematochezia or melena. He has had decreased oral intake. In the emergency department, the patient was febrile up to 1 to 3.4 F. He was hemodynamically stable. Oxygen saturation was in 80s on room air. He was placed on 5 L nasal cannula. The patient was started on steroids and remdesivir. Chest x-ray showed bilateral patchy infiltrates.  remdesivir and baricitinib were stopped after 3 days due to rising LFTs  Assessment/Plan: Acute respiratory failure secondary to COVID-19 pneumonia -Currently stable on 4-5 L>>6L>>10L -saturations 90-92% when prone -Wean oxygen as tolerated -CRP 19.3>>9.4>>7.5>>9.3>>4.3 -Ferritin 392>>699>>609>>484 -D-dimer 1.17>>0.72>>1.15>>0.87>>0.76 -PCT 0.25 -Follow blood cultures--neg to date -Continue IV steroids -d/c remdesivir due to rising LFTs>>improved -give final 2 doses remdesivir -disContinue baricitinibdue to rising LFTs -Lay prone if tolerated as much as possible -Incentive spirometry -continue vitamin C and Zinc -10/03/19--CTA chest--negative PE  Acute kidney injury -Baseline creatinine 1.0-1.3 -Secondary to volume depletion -Patient was fluid resuscitated-->improved -A.m. BMP   Microcytic anemia -iron saturation 9% -ferritin 495--difficult to interpret in setting of COVID 19 -total iron 26  Morbid obesity -BMI 59.04 -Lifestyle  modification -At risk for complications secondary to COVID-19  Transaminasemia -Follow-up viral hepatitis panel--neg -Secondary to COVID-19 infection -No abdominal pain -Tolerating diet -d/c baricitinib and monitor -LFTs improving-->restart remdesivir     Status is: Inpatient  Remains inpatient appropriate because:IV treatments appropriate due to intensity of illness or inability to take PO   Dispo: The patient is from:Home Anticipated d/c is EN:IDPO Anticipated d/c date is: 2 days Patient currently is not medically stable to d/c.        Family Communication:spouse updated  Consultants:none  Code Status: FULL  DVT Prophylaxis: [place] Lovenox   Procedures: As Listed in Progress Note Above  Antibiotics: None   Subjective: Patient complains of dyspnea on exertion but comfortable at rest.  Denies f/c, cp, n/v/d, abd pain.  Objective: Vitals:   10/03/19 1847 10/03/19 2022 10/04/19 0406 10/04/19 1227  BP: (!) 129/95 (!) 130/119 138/87 (!) 149/99  Pulse: (!) 105 (!) 108 100 (!) 111  Resp: 20 20 20 20   Temp: 99.3 F (37.4 C) 99 F (37.2 C) 98.9 F (37.2 C) 99.4 F (37.4 C)  TempSrc: Oral Oral Oral Oral  SpO2: 91% 96% 90% (!) 86%  Weight:      Height:        Intake/Output Summary (Last 24 hours) at 10/04/2019 1329 Last data filed at 10/04/2019 0900 Gross per 24 hour  Intake 840 ml  Output 850 ml  Net -10 ml   Weight change:  Exam:   General:  Pt is alert, follows commands appropriately, not in acute distress  HEENT: No icterus, No thrush, No neck mass, [place]/AT  Cardiovascular: RRR, S1/S2, no rubs, no gallops  Respiratory: diminished BS.  Bibasilar rales  Abdomen: Soft/+BS, non tender, non distended, no guarding  Extremities: Non pitting edema, No lymphangitis, No petechiae, No rashes, no synovitis   Data  Reviewed: I have personally reviewed following labs and imaging  studies Basic Metabolic Panel: Recent Labs  Lab 09/30/19 0755 10/01/19 0817 10/02/19 0727 10/03/19 0756 10/04/19 0738  NA 139 139 145 141 141  K 4.3 5.1 4.7 4.6 4.5  CL 99 101 103 101 101  CO2 26 28 29 28 27   GLUCOSE 163* 164* 169* 160* 187*  BUN 26* 22* 23* 22* 23*  CREATININE 1.40* 1.19 1.20 1.08 1.13  CALCIUM 9.1 9.2 9.5 9.3 9.4  MG 2.6* 2.7* 2.7* 2.5* 2.3   Liver Function Tests: Recent Labs  Lab 09/30/19 0755 10/01/19 0817 10/02/19 0727 10/03/19 0756 10/04/19 0738  AST 101* 261* 289* 111* 45*  ALT 94* 210* 336* 268* 175*  ALKPHOS 50 52 61 59 53  BILITOT 0.6 0.6 1.0 0.5 0.5  PROT 8.7* 8.2* 8.6* 8.3* 7.8  ALBUMIN 3.3* 3.1* 3.2* 3.1* 3.1*   No results for input(s): LIPASE, AMYLASE in the last 168 hours. No results for input(s): AMMONIA in the last 168 hours. Coagulation Profile: No results for input(s): INR, PROTIME in the last 168 hours. CBC: Recent Labs  Lab 09/30/19 0755 10/01/19 0817 10/02/19 0727 10/03/19 0756 10/04/19 0738  WBC 8.7 12.3* 14.3* 16.9* 17.9*  NEUTROABS 7.5 10.5* 11.9* 13.3* 13.0*  HGB 10.7* 11.1* 11.7* 11.9* 11.6*  HCT 36.2* 37.9* 40.7 40.5 39.7  MCV 68.0* 68.8* 69.0* 68.6* 68.8*  PLT 253 291 323 359 423*   Cardiac Enzymes: No results for input(s): CKTOTAL, CKMB, CKMBINDEX, TROPONINI in the last 168 hours. BNP: Invalid input(s): POCBNP CBG: No results for input(s): GLUCAP in the last 168 hours. HbA1C: No results for input(s): HGBA1C in the last 72 hours. Urine analysis: No results found for: COLORURINE, APPEARANCEUR, LABSPEC, PHURINE, GLUCOSEU, HGBUR, BILIRUBINUR, KETONESUR, PROTEINUR, UROBILINOGEN, NITRITE, LEUKOCYTESUR Sepsis Labs: @LABRCNTIP (procalcitonin:4,lacticidven:4) ) Recent Results (from the past 240 hour(s))  SARS Coronavirus 2 by RT PCR (hospital order, performed in [facility] hospital lab) Nasopharyngeal Nasopharyngeal Swab     Status: Abnormal   Collection Time: 09/29/19  2:23 PM   Specimen: Nasopharyngeal Swab   Result Value Ref Range Status   SARS Coronavirus 2 POSITIVE (A) NEGATIVE Final    Comment: RESULT CALLED TO, READ BACK BY AND VERIFIED WITH: HOLCUM @ 1715 ON [facility] BY HENDERSON L (NOTE) SARS-CoV-2 target nucleic acids are DETECTED  SARS-CoV-2 RNA is generally detectable in upper respiratory specimens  during the acute phase of infection.  Positive results are indicative  of the presence of the identified virus, but do not rule out bacterial infection or co-infection with other pathogens not detected by the test.  Clinical correlation with patient history and  other diagnostic information is necessary to determine patient infection status.  The expected result is negative.  Fact Sheet for Patients:   11/29/19   Fact Sheet for Healthcare Providers:   235573    This test is not yet approved or cleared by the [email or web address] FDA and  has been authorized for detection and/or diagnosis of SARS-CoV-2 by FDA under an Emergency Use Authorization (EUA).  This EUA will remain in effect (meaning thi s test can be used) for the duration of  the COVID-19 declaration under Section 564(b)(1) of the Act, 21 U.S.C. section 360-bbb-3(b)(1), unless the authorization is terminated or revoked sooner.  Performed at [facility], [address], [place], [address] [place]   Blood Culture (routine x 2)     Status: None (Preliminary result)   Collection Time: 09/29/19  3:16 PM   Specimen:  BLOOD  Result Value Ref Range Status   Specimen Description BLOOD LEFT ANTECUBITAL  Final   Special Requests   Final    BOTTLES DRAWN AEROBIC AND ANAEROBIC Blood Culture adequate volume   Culture   Final    NO GROWTH 3 DAYS Performed at [facility], [address], [place], [place] [address]    Report Status PENDING  Incomplete  Blood Culture (routine x 2)     Status: None (Preliminary result)   Collection Time: 09/29/19  3:16 PM   Specimen:  BLOOD  Result Value Ref Range Status   Specimen Description BLOOD LEFT ANTECUBITAL  Final   Special Requests   Final    BOTTLES DRAWN AEROBIC AND ANAEROBIC Blood Culture adequate volume   Culture   Final    NO GROWTH 3 DAYS Performed at [facility], [address] [place], [place] [address]    Report Status PENDING  Incomplete     Scheduled Meds: . vitamin C  500 mg Oral Daily  . enoxaparin (LOVENOX) injection  80 mg Subcutaneous Q24H  . famotidine  20 mg Oral Daily  . furosemide  20 mg Intravenous Once  . methylPREDNISolone (SOLU-MEDROL) injection  125 mg Intravenous Q12H  . zinc sulfate  220 mg Oral Daily   Continuous Infusions: . sodium chloride 250 mL (09/29/19 1845)    Procedures/Studies: CT ANGIO CHEST PE W OR WO CONTRAST  Result Date: 10/03/2019 CLINICAL DATA:  Shortness of breath.  COVID positive. EXAM: CT ANGIOGRAPHY CHEST WITH CONTRAST TECHNIQUE: Multidetector CT imaging of the chest was performed using the standard protocol during bolus administration of intravenous contrast. Multiplanar CT image reconstructions and MIPs were obtained to evaluate the vascular anatomy. CONTRAST:  OMNIPAQUE IOHEXOL 350 MG/ML SOLN COMPARISON:  None. FINDINGS: Cardiovascular: Evaluation for pulmonary emboli is limited by respiratory motion artifact and patient body habitus. There is no pulmonary embolus or evidence of right heart strain. The size of the main pulmonary artery is normal. Heart size is normal, with no pericardial effusion. The course and caliber of the aorta are normal. There is no atherosclerotic calcification. Opacification decreased due to pulmonary arterial phase contrast bolus timing. Mediastinum/Nodes: -- No mediastinal lymphadenopathy. -- No hilar lymphadenopathy. -- No axillary lymphadenopathy. -- No supraclavicular lymphadenopathy. -- Normal thyroid gland where visualized. -  Unremarkable esophagus. Lungs/Pleura: There are diffuse bilateral ground-glass airspace  opacities without evidence for pneumothorax or significant pleural effusion. The trachea is unremarkable. Upper Abdomen: Contrast bolus timing is not optimized for evaluation of the abdominal organs. There is probable hepatic steatosis. Musculoskeletal: No chest wall abnormality. No bony spinal canal stenosis. Review of the MIP images confirms the above findings. IMPRESSION: 1. Evaluation for pulmonary emboli is limited by respiratory motion artifact and patient body habitus. Given this limitation, there is no evidence for acute pulmonary embolus. 2. Diffuse bilateral ground-glass airspace opacities, consistent with the patient's history of viral pneumonia. 3. Probable hepatic steatosis. Electronically Signed   By: Katherine Mantle M.D.   On: 10/03/2019 17:42   DG Chest Port 1 View  Result Date: 09/29/2019 CLINICAL DATA:  Cough and shortness of breath.  Diffuse pain. EXAM: PORTABLE CHEST 1 VIEW COMPARISON:  None. FINDINGS: Borderline cardiomegaly. Heterogeneous bilateral lung opacities. No pneumothorax or large pleural effusion. Degenerative change of both acromioclavicular joints. No evidence of acute osseous abnormality. IMPRESSION: Heterogeneous bilateral lung opacities, suspicious for multifocal pneumonia, including COVID-19. Electronically Signed   By: Narda Rutherford M.D.   On: 09/29/2019 15:05  Catarina Hartshornavid Minela [place], DO  [facility]  If 7PM-7AM, please contact night-coverage [email or web address] Password [email or web address] 10/04/2019, 1:29 PM   LOS: 5 days

## 2019-10-04 NOTE — Progress Notes (Signed)
   10/04/19 1227  Assess: MEWS Score  Temp 99.4 F (37.4 C)  BP (!) 149/99  Pulse Rate (!) 111  Resp 20  Level of Consciousness Alert  SpO2 (!) 86 %  Assess: MEWS Score  MEWS Temp 0  MEWS Systolic 0  MEWS Pulse 2  MEWS RR 0  MEWS LOC 0  MEWS Score 2  MEWS Score Color Yellow  Assess: if the MEWS score is Yellow or Red  Were vital signs taken at a resting state? Yes  Focused Assessment No change from prior assessment  Early Detection of Sepsis Score *See Row Information* Low  MEWS guidelines implemented *See Row Information* Yes  Treat  MEWS Interventions Administered scheduled meds/treatments  Take Vital Signs  Increase Vital Sign Frequency  Yellow: Q 2hr X 2 then Q 4hr X 2, if remains yellow, continue Q 4hrs

## 2019-10-05 LAB — COMPREHENSIVE METABOLIC PANEL
ALT: 124 U/L — ABNORMAL HIGH (ref 0–44)
AST: 27 U/L (ref 15–41)
Albumin: 3 g/dL — ABNORMAL LOW (ref 3.5–5.0)
Alkaline Phosphatase: 48 U/L (ref 38–126)
Anion gap: 11 (ref 5–15)
BUN: 22 mg/dL — ABNORMAL HIGH (ref 6–20)
CO2: 26 mmol/L (ref 22–32)
Calcium: 9.3 mg/dL (ref 8.9–10.3)
Chloride: 102 mmol/L (ref 98–111)
Creatinine, Ser: 1.06 mg/dL (ref 0.61–1.24)
GFR calc Af Amer: >60 mL/min (ref >60)
GFR calc non Af Amer: >60 mL/min (ref >60)
Glucose, Bld: 175 mg/dL — ABNORMAL HIGH (ref 70–99)
Potassium: 4.6 mmol/L (ref 3.5–5.1)
Sodium: 139 mmol/L (ref 135–145)
Total Bilirubin: 0.4 mg/dL (ref 0.3–1.2)
Total Protein: 7.6 g/dL (ref 6.5–8.1)

## 2019-10-05 LAB — CBC
HCT: 40 % (ref 39.0–52.0)
Hemoglobin: 11.6 g/dL — ABNORMAL LOW (ref 13.0–17.0)
MCH: 20 pg — ABNORMAL LOW (ref 26.0–34.0)
MCHC: 29 g/dL — ABNORMAL LOW (ref 30.0–36.0)
MCV: 69 fL — ABNORMAL LOW (ref 80.0–100.0)
Platelets: 465 10*3/uL — ABNORMAL HIGH (ref 150–400)
RBC: 5.8 MIL/uL (ref 4.22–5.81)
RDW: 17.1 % — ABNORMAL HIGH (ref 11.5–15.5)
WBC: 18.4 10*3/uL — ABNORMAL HIGH (ref 4.0–10.5)
nRBC: 0 % (ref 0.0–0.2)

## 2019-10-05 LAB — D-DIMER, QUANTITATIVE: D-Dimer, Quant: 0.6 ug/mL-FEU — ABNORMAL HIGH (ref 0.00–0.50)

## 2019-10-05 LAB — FERRITIN: Ferritin: 357 ng/mL — ABNORMAL HIGH (ref 24–336)

## 2019-10-05 LAB — C-REACTIVE PROTEIN: CRP: 1.7 mg/dL — ABNORMAL HIGH (ref <1.0)

## 2019-10-05 MED ORDER — SALINE SPRAY 0.65 % NA SOLN
1.0000 | NASAL | Status: DC | PRN
  Administered 2019-10-05: 1 via NASAL
  Filled 2019-10-05: qty 44

## 2019-10-05 NOTE — Plan of Care (Signed)

## 2019-10-05 NOTE — Progress Notes (Signed)
PROGRESS NOTE  Ricardo Lopez DOB: 15-Jan-1976 DOA: 09/29/2019 PCP: Donita BrooksPickard, Warren T, MD  Brief History: 44 year old male with a history of hypertension and morbid obesity presenting with shortness of breath that began on 09/26/2019. He also complained of fatigue and myalgias with coughing some occasional yellow sputum. He has also had fevers and chills. He is not vaccinated for COVID-19. Because of worsening shortness of breath, the patient presented for further evaluation. He denies any nausea, vomiting, abdominal pain. He did have some loose stools without hematochezia or melena. He has had decreased oral intake. In the emergency department, the patient was febrile up to 1 to 3.4 F. He was hemodynamically stable. Oxygen saturation was in 80s on room air. He was placed on 5 L nasal cannula. The patient was started on steroids and remdesivir. Chest x-ray showed bilateral patchy infiltrates.  remdesivir and baricitinib were stopped after 3 days due to rising LFTs  Assessment/Plan: Acute respiratory failure secondary to COVID-19 pneumonia -Currently stable on 4-5 L>>6L>>10L -saturations 90-92% when prone -Wean oxygen as tolerated -CRP 19.3>>9.4>>7.5>>9.3>>4.3>>1.7 -Ferritin 392>>699>>609>>484>>357 -D-dimer 1.17>>0.72>>1.15>>0.87>>0.76 -PCT 0.25 -Follow blood cultures--neg to date -Continue IV steroids -d/c remdesivir due to rising LFTs>>improved -given final 2 doses remdesivir (last dose given 9/14) -disContinue baricitinibdue to rising LFTs -Lay prone if tolerated as much as possible -Incentive spirometry -continue vitamin C and Zinc -10/03/19--CTA chest--negative PE  Acute kidney injury -Baseline creatinine 1.0-1.3 -Secondary to volume depletion -Patient was fluid resuscitated-->improved -A.m. BMP   Microcytic anemia -iron saturation 9% -ferritin 495--difficult to interpret in setting of COVID 19 -total iron 26  Morbid obesity -BMI  59.04 -Lifestyle modification -At risk for complications secondary to COVID-19  Transaminasemia -Follow-up viral hepatitis panel--neg -Secondary to COVID-19 infection -No abdominal pain -Tolerating diet -d/c baricitinib and monitor -LFTs improving-->restart remdesivir     Status is: Inpatient  Remains inpatient appropriate because:IV treatments appropriate due to intensity of illness or inability to take PO   Dispo: The patient is from:Home Anticipated d/c is WJ:XBJYto:Home Anticipated d/c date is: 2 days Patient currently is not medically stable to d/c.        Family Communication:spouse updated  Consultants:none  Code Status: FULL  DVT Prophylaxis: [place] Lovenox   Procedures: As Listed in Progress Note Above  Antibiotics: None     Subjective: Patient denies fevers, chills, headache, chest pain, dyspnea, nausea, vomiting, diarrhea, abdominal pain, dysuria, hematuria, hematochezia, and melena.   Objective: Vitals:   10/04/19 2038 10/04/19 2127 10/05/19 0535 10/05/19 1153  BP:  (!) 158/91 (!) 142/85 (!) 138/95  Pulse:  98 98 (!) 101  Resp:  20 20 20   Temp:  99.5 F (37.5 C) 98.7 F (37.1 C) 98.9 F (37.2 C)  TempSrc:  Oral Oral Oral  SpO2: 93% (!) 82% 91% 95%  Weight:      Height:        Intake/Output Summary (Last 24 hours) at 10/05/2019 1728 Last data filed at 10/05/2019 1300 Gross per 24 hour  Intake 1080 ml  Output 1500 ml  Net -420 ml   Weight change:  Exam:   General:  Pt is alert, follows commands appropriately, not in acute distress  HEENT: No icterus, No thrush, No neck mass, [place]/AT  Cardiovascular: RRR, S1/S2, no rubs, no gallops  Respiratory: bibasilar rales. No wheeze  Abdomen: Soft/+BS, non tender, non distended, no guarding  Extremities: Nonpitting edema, No lymphangitis, No petechiae, No rashes, no synovitis  Data Reviewed: I have personally reviewed  following labs and imaging studies Basic Metabolic Panel: Recent Labs  Lab 09/30/19 0755 09/30/19 0755 10/01/19 0817 10/02/19 0727 10/03/19 0756 10/04/19 0738 10/05/19 0652  NA 139   < > 139 145 141 141 139  K 4.3   < > 5.1 4.7 4.6 4.5 4.6  CL 99   < > 101 103 101 101 102  CO2 26   < > 28 29 28 27 26   GLUCOSE 163*   < > 164* 169* 160* 187* 175*  BUN 26*   < > 22* 23* 22* 23* 22*  CREATININE 1.40*   < > 1.19 1.20 1.08 1.13 1.06  CALCIUM 9.1   < > 9.2 9.5 9.3 9.4 9.3  MG 2.6*  --  2.7* 2.7* 2.5* 2.3  --    < > = values in this interval not displayed.   Liver Function Tests: Recent Labs  Lab 10/01/19 0817 10/02/19 0727 10/03/19 0756 10/04/19 0738 10/05/19 0652  AST 261* 289* 111* 45* 27  ALT 210* 336* 268* 175* 124*  ALKPHOS 52 61 59 53 48  BILITOT 0.6 1.0 0.5 0.5 0.4  PROT 8.2* 8.6* 8.3* 7.8 7.6  ALBUMIN 3.1* 3.2* 3.1* 3.1* 3.0*   No results for input(s): LIPASE, AMYLASE in the last 168 hours. No results for input(s): AMMONIA in the last 168 hours. Coagulation Profile: No results for input(s): INR, PROTIME in the last 168 hours. CBC: Recent Labs  Lab 09/30/19 0755 09/30/19 0755 10/01/19 0817 10/02/19 0727 10/03/19 0756 10/04/19 0738 10/05/19 0652  WBC 8.7   < > 12.3* 14.3* 16.9* 17.9* 18.4*  NEUTROABS 7.5  --  10.5* 11.9* 13.3* 13.0*  --   HGB 10.7*   < > 11.1* 11.7* 11.9* 11.6* 11.6*  HCT 36.2*   < > 37.9* 40.7 40.5 39.7 40.0  MCV 68.0*   < > 68.8* 69.0* 68.6* 68.8* 69.0*  PLT 253   < > 291 323 359 423* 465*   < > = values in this interval not displayed.   Cardiac Enzymes: No results for input(s): CKTOTAL, CKMB, CKMBINDEX, TROPONINI in the last 168 hours. BNP: Invalid input(s): POCBNP CBG: No results for input(s): GLUCAP in the last 168 hours. HbA1C: No results for input(s): HGBA1C in the last 72 hours. Urine analysis: No results found for: COLORURINE, APPEARANCEUR, LABSPEC, PHURINE, GLUCOSEU, HGBUR, BILIRUBINUR, KETONESUR, PROTEINUR, UROBILINOGEN,  NITRITE, LEUKOCYTESUR Sepsis Labs: @LABRCNTIP (procalcitonin:4,lacticidven:4) ) Recent Results (from the past 240 hour(s))  SARS Coronavirus 2 by RT PCR (hospital order, performed in [facility] hospital lab) Nasopharyngeal Nasopharyngeal Swab     Status: Abnormal   Collection Time: 09/29/19  2:23 PM   Specimen: Nasopharyngeal Swab  Result Value Ref Range Status   SARS Coronavirus 2 POSITIVE (A) NEGATIVE Final    Comment: RESULT CALLED TO, READ BACK BY AND VERIFIED WITH: HOLCUM @ 1715 ON [facility] BY HENDERSON L (NOTE) SARS-CoV-2 target nucleic acids are DETECTED  SARS-CoV-2 RNA is generally detectable in upper respiratory specimens  during the acute phase of infection.  Positive results are indicative  of the presence of the identified virus, but do not rule out bacterial infection or co-infection with other pathogens not detected by the test.  Clinical correlation with patient history and  other diagnostic information is necessary to determine patient infection status.  The expected result is negative.  Fact Sheet for Patients:   11/29/19   Fact Sheet for Healthcare Providers:   175102    This test  is not yet approved or cleared by the [place] and  has been authorized for detection and/or diagnosis of SARS-CoV-2 by FDA under an Emergency Use Authorization (EUA).  This EUA will remain in effect (meaning thi s test can be used) for the duration of  the COVID-19 declaration under Section 564(b)(1) of the Act, 21 U.S.C. section 360-bbb-3(b)(1), unless the authorization is terminated or revoked sooner.  Performed at [facility], [address], [place], [place] [address]   Blood Culture (routine x 2)     Status: None   Collection Time: 09/29/19  3:16 PM   Specimen: BLOOD  Result Value Ref Range Status   Specimen Description BLOOD LEFT ANTECUBITAL  Final   Special Requests   Final    BOTTLES DRAWN  AEROBIC AND ANAEROBIC Blood Culture adequate volume   Culture   Final    NO GROWTH 5 DAYS Performed at [facility], [address] [place], [place] [address]    Report Status 10/04/2019 FINAL  Final  Blood Culture (routine x 2)     Status: None   Collection Time: 09/29/19  3:16 PM   Specimen: BLOOD  Result Value Ref Range Status   Specimen Description BLOOD LEFT ANTECUBITAL  Final   Special Requests   Final    BOTTLES DRAWN AEROBIC AND ANAEROBIC Blood Culture adequate volume   Culture   Final    NO GROWTH 5 DAYS Performed at [facility], [address] [place], [place] [address]    Report Status 10/04/2019 FINAL  Final     Scheduled Meds: . vitamin C  500 mg Oral Daily  . enoxaparin (LOVENOX) injection  80 mg Subcutaneous Q24H  . famotidine  20 mg Oral Daily  . methylPREDNISolone (SOLU-MEDROL) injection  125 mg Intravenous Q12H  . zinc sulfate  220 mg Oral Daily   Continuous Infusions: . sodium chloride 250 mL (09/29/19 1845)    Procedures/Studies: CT ANGIO CHEST PE W OR WO CONTRAST  Result Date: 10/03/2019 CLINICAL DATA:  Shortness of breath.  COVID positive. EXAM: CT ANGIOGRAPHY CHEST WITH CONTRAST TECHNIQUE: Multidetector CT imaging of the chest was performed using the standard protocol during bolus administration of intravenous contrast. Multiplanar CT image reconstructions and MIPs were obtained to evaluate the vascular anatomy. CONTRAST:  OMNIPAQUE IOHEXOL 350 MG/ML SOLN COMPARISON:  None. FINDINGS: Cardiovascular: Evaluation for pulmonary emboli is limited by respiratory motion artifact and patient body habitus. There is no pulmonary embolus or evidence of right heart strain. The size of the main pulmonary artery is normal. Heart size is normal, with no pericardial effusion. The course and caliber of the aorta are normal. There is no atherosclerotic calcification. Opacification decreased due to pulmonary arterial phase contrast bolus timing. Mediastinum/Nodes: -- No  mediastinal lymphadenopathy. -- No hilar lymphadenopathy. -- No axillary lymphadenopathy. -- No supraclavicular lymphadenopathy. -- Normal thyroid gland where visualized. -  Unremarkable esophagus. Lungs/Pleura: There are diffuse bilateral ground-glass airspace opacities without evidence for pneumothorax or significant pleural effusion. The trachea is unremarkable. Upper Abdomen: Contrast bolus timing is not optimized for evaluation of the abdominal organs. There is probable hepatic steatosis. Musculoskeletal: No chest wall abnormality. No bony spinal canal stenosis. Review of the MIP images confirms the above findings. IMPRESSION: 1. Evaluation for pulmonary emboli is limited by respiratory motion artifact and patient body habitus. Given this limitation, there is no evidence for acute pulmonary embolus. 2. Diffuse bilateral ground-glass airspace opacities, consistent with the patient's history of viral pneumonia. 3. Probable hepatic  steatosis. Electronically Signed   By: Katherine Mantle M.D.   On: 10/03/2019 17:42   DG Chest Port 1 View  Result Date: 09/29/2019 CLINICAL DATA:  Cough and shortness of breath.  Diffuse pain. EXAM: PORTABLE CHEST 1 VIEW COMPARISON:  None. FINDINGS: Borderline cardiomegaly. Heterogeneous bilateral lung opacities. No pneumothorax or large pleural effusion. Degenerative change of both acromioclavicular joints. No evidence of acute osseous abnormality. IMPRESSION: Heterogeneous bilateral lung opacities, suspicious for multifocal pneumonia, including COVID-19. Electronically Signed   By: Narda Rutherford M.D.   On: 09/29/2019 15:05    Catarina Hartshorn, DO  [facility]  If 7PM-7AM, please contact night-coverage [email or web address] Password [email or web address] 10/05/2019, 5:28 PM   LOS: 6 days

## 2019-10-06 LAB — COMPREHENSIVE METABOLIC PANEL
ALT: 104 U/L — ABNORMAL HIGH (ref 0–44)
AST: 33 U/L (ref 15–41)
Albumin: 3.1 g/dL — ABNORMAL LOW (ref 3.5–5.0)
Alkaline Phosphatase: 49 U/L (ref 38–126)
Anion gap: 12 (ref 5–15)
BUN: 22 mg/dL — ABNORMAL HIGH (ref 6–20)
CO2: 25 mmol/L (ref 22–32)
Calcium: 9.3 mg/dL (ref 8.9–10.3)
Chloride: 101 mmol/L (ref 98–111)
Creatinine, Ser: 1.12 mg/dL (ref 0.61–1.24)
GFR calc Af Amer: >60 mL/min (ref >60)
GFR calc non Af Amer: >60 mL/min (ref >60)
Glucose, Bld: 189 mg/dL — ABNORMAL HIGH (ref 70–99)
Potassium: 4.7 mmol/L (ref 3.5–5.1)
Sodium: 138 mmol/L (ref 135–145)
Total Bilirubin: 0.5 mg/dL (ref 0.3–1.2)
Total Protein: 7.9 g/dL (ref 6.5–8.1)

## 2019-10-06 LAB — CBC
HCT: 40.1 % (ref 39.0–52.0)
Hemoglobin: 11.7 g/dL — ABNORMAL LOW (ref 13.0–17.0)
MCH: 19.7 pg — ABNORMAL LOW (ref 26.0–34.0)
MCHC: 29.2 g/dL — ABNORMAL LOW (ref 30.0–36.0)
MCV: 67.6 fL — ABNORMAL LOW (ref 80.0–100.0)
Platelets: 537 10*3/uL — ABNORMAL HIGH (ref 150–400)
RBC: 5.93 MIL/uL — ABNORMAL HIGH (ref 4.22–5.81)
RDW: 16.2 % — ABNORMAL HIGH (ref 11.5–15.5)
WBC: 22 10*3/uL — ABNORMAL HIGH (ref 4.0–10.5)
nRBC: 0 % (ref 0.0–0.2)

## 2019-10-06 LAB — FERRITIN: Ferritin: 380 ng/mL — ABNORMAL HIGH (ref 24–336)

## 2019-10-06 LAB — D-DIMER, QUANTITATIVE: D-Dimer, Quant: 0.66 ug/mL-FEU — ABNORMAL HIGH (ref 0.00–0.50)

## 2019-10-06 LAB — C-REACTIVE PROTEIN: CRP: 0.7 mg/dL (ref <1.0)

## 2019-10-06 MED ORDER — PREDNISONE 20 MG PO TABS
40.0000 mg | ORAL_TABLET | Freq: Every day | ORAL | Status: DC
  Administered 2019-10-07: 40 mg via ORAL
  Filled 2019-10-06: qty 2

## 2019-10-06 NOTE — Progress Notes (Signed)
PROGRESS NOTE  Ricardo Parkobert E Lafosse Jr. ZOX:[id number] DOB: 27-Aug-1975 DOA: 09/29/2019 PCP: Donita BrooksPickard, Warren T, MD  Brief History: 44 year old male with a history of hypertension and morbid obesity presenting with shortness of breath that began on 09/26/2019. He also complained of fatigue and myalgias with coughing some occasional yellow sputum. He has also had fevers and chills. He is not vaccinated for COVID-19. Because of worsening shortness of breath, the patient presented for further evaluation. He denies any nausea, vomiting, abdominal pain. He did have some loose stools without hematochezia or melena. He has had decreased oral intake. In the emergency department, the patient was febrile up to 1 to 3.4 F. He was hemodynamically stable. Oxygen saturation was in 80s on room air. He was placed on 5 L nasal cannula. The patient was started on steroids and remdesivir. Chest x-ray showed bilateral patchy infiltrates.  remdesivir and baricitinib were stopped after 3 days due to rising LFTs  Assessment/Plan: Acute respiratory failure secondary to COVID-19 pneumonia -Currently stable on 10 L, wean down oxygen as tolerated -saturations 90-92% when prone -Wean oxygen as tolerated -CRP 19.3>>9.4>>7.5>>9.3>>4.3>>1.7>0.7 -Ferritin 392>>699>>609>>484>>357>380 -D-dimer 1.17>>0.72>>1.15>>0.87>>0.76 -PCT 0.25 -Follow blood cultures--neg to date -We will transition IV steroids to oral prednisone -d/c remdesivir due to rising LFTs>>improved -given final 2 doses remdesivir (last dose given 9/14) -disContinue baricitinibdue to rising LFTs -Lay prone if tolerated as much as possible -Incentive spirometry -continue vitamin C and Zinc -10/03/19--CTA chest--negative PE  Acute kidney injury -Baseline creatinine 1.0-1.3 -Secondary to volume depletion -Patient was fluid resuscitated-->improved   Microcytic anemia -iron saturation 9% -ferritin 495--difficult to interpret in setting of  COVID 19 -total iron 26  Morbid obesity -BMI 59.04 -Lifestyle modification -At risk for complications secondary to COVID-19  Transaminasemia -Follow-up viral hepatitis panel--neg -Secondary to COVID-19 infection -No abdominal pain -Tolerating diet -d/c baricitinib and monitor -LFTs improving     Status is: Inpatient  Remains inpatient appropriate because:IV treatments appropriate due to intensity of illness or inability to take PO   Dispo: The patient is from:Home Anticipated d/c is WJ:XBJYto:Home Anticipated d/c date is: 1 days Patient currently is not medically stable to d/c.        Family Communication:spouse updated 9/15  Consultants:none  Code Status: FULL  DVT Prophylaxis: [place] Lovenox   Procedures: As Listed in Progress Note Above  Antibiotics: None     Subjective: Still requiring 10 L of oxygen.  Overall shortness of breath improving.  Able to ambulate.   Objective: Vitals:   10/06/19 0232 10/06/19 0929 10/06/19 1455 10/06/19 1700  BP:  (!) 144/96 (!) 156/91   Pulse:  (!) 106 (!) 109   Resp:  20 20   Temp:  98.1 F (36.7 C) 98.4 F (36.9 C)   TempSrc:  Oral Oral   SpO2: (!) 83% 92% 94% 95%  Weight:      Height:       No intake or output data in the 24 hours ending 10/06/19 2029 Weight change:  Exam:  General exam: Alert, awake, oriented x 3 Respiratory system: Clear to auscultation. Respiratory effort normal. Cardiovascular system:RRR. No murmurs, rubs, gallops. Gastrointestinal system: Abdomen is nondistended, soft and nontender. No organomegaly or masses felt. Normal bowel sounds heard. Central nervous system: Alert and oriented. No focal neurological deficits. Extremities: No C/C/E, +pedal pulses Skin: No rashes, lesions or ulcers  Psychiatry: Judgement and insight appear normal. Mood & affect appropriate.    Data Reviewed: I have personally  reviewed  following labs and imaging studies Basic Metabolic Panel: Recent Labs  Lab 09/30/19 0755 09/30/19 0755 10/01/19 0817 10/01/19 0817 10/02/19 0727 10/03/19 0756 10/04/19 0738 10/05/19 0652 10/06/19 0647  NA 139   < > 139   < > 145 141 141 139 138  K 4.3   < > 5.1   < > 4.7 4.6 4.5 4.6 4.7  CL 99   < > 101   < > 103 101 101 102 101  CO2 26   < > 28   < > 29 28 27 26 25   GLUCOSE 163*   < > 164*   < > 169* 160* 187* 175* 189*  BUN 26*   < > 22*   < > 23* 22* 23* 22* 22*  CREATININE 1.40*   < > 1.19   < > 1.20 1.08 1.13 1.06 1.12  CALCIUM 9.1   < > 9.2   < > 9.5 9.3 9.4 9.3 9.3  MG 2.6*  --  2.7*  --  2.7* 2.5* 2.3  --   --    < > = values in this interval not displayed.   Liver Function Tests: Recent Labs  Lab 10/02/19 0727 10/03/19 0756 10/04/19 0738 10/05/19 0652 10/06/19 0647  AST 289* 111* 45* 27 33  ALT 336* 268* 175* 124* 104*  ALKPHOS 61 59 53 48 49  BILITOT 1.0 0.5 0.5 0.4 0.5  PROT 8.6* 8.3* 7.8 7.6 7.9  ALBUMIN 3.2* 3.1* 3.1* 3.0* 3.1*   No results for input(s): LIPASE, AMYLASE in the last 168 hours. No results for input(s): AMMONIA in the last 168 hours. Coagulation Profile: No results for input(s): INR, PROTIME in the last 168 hours. CBC: Recent Labs  Lab 09/30/19 0755 09/30/19 0755 10/01/19 0817 10/01/19 0817 10/02/19 0727 10/03/19 0756 10/04/19 0738 10/05/19 0652 10/06/19 0647  WBC 8.7   < > 12.3*   < > 14.3* 16.9* 17.9* 18.4* 22.0*  NEUTROABS 7.5  --  10.5*  --  11.9* 13.3* 13.0*  --   --   HGB 10.7*   < > 11.1*   < > 11.7* 11.9* 11.6* 11.6* 11.7*  HCT 36.2*   < > 37.9*   < > 40.7 40.5 39.7 40.0 40.1  MCV 68.0*   < > 68.8*   < > 69.0* 68.6* 68.8* 69.0* 67.6*  PLT 253   < > 291   < > 323 359 423* 465* 537*   < > = values in this interval not displayed.   Cardiac Enzymes: No results for input(s): CKTOTAL, CKMB, CKMBINDEX, TROPONINI in the last 168 hours. BNP: Invalid input(s): POCBNP CBG: No results for input(s): GLUCAP in the last 168  hours. HbA1C: No results for input(s): HGBA1C in the last 72 hours. Urine analysis: No results found for: COLORURINE, APPEARANCEUR, LABSPEC, PHURINE, GLUCOSEU, HGBUR, BILIRUBINUR, KETONESUR, PROTEINUR, UROBILINOGEN, NITRITE, LEUKOCYTESUR Sepsis Labs: @LABRCNTIP (procalcitonin:4,lacticidven:4) ) Recent Results (from the past 240 hour(s))  SARS Coronavirus 2 by RT PCR (hospital order, performed in [facility] hospital lab) Nasopharyngeal Nasopharyngeal Swab     Status: Abnormal   Collection Time: 09/29/19  2:23 PM   Specimen: Nasopharyngeal Swab  Result Value Ref Range Status   SARS Coronavirus 2 POSITIVE (A) NEGATIVE Final    Comment: RESULT CALLED TO, READ BACK BY AND VERIFIED WITH: [facility] @ 1715 ON [facility] BY [facility] L (NOTE) SARS-CoV-2 target nucleic acids are DETECTED  SARS-CoV-2 RNA is generally detectable in upper respiratory specimens  during the acute  phase of infection.  Positive results are indicative  of the presence of the identified virus, but do not rule out bacterial infection or co-infection with other pathogens not detected by the test.  Clinical correlation with patient history and  other diagnostic information is necessary to determine patient infection status.  The expected result is negative.  Fact Sheet for Patients:   BoilerBrush.com.cy   Fact Sheet for Healthcare Providers:   https://pope.com/    This test is not yet approved or cleared by the [place] FDA and  has been authorized for detection and/or diagnosis of SARS-CoV-2 by FDA under an Emergency Use Authorization (EUA).  This EUA will remain in effect (meaning thi s test can be used) for the duration of  the COVID-19 declaration under Section 564(b)(1) of the Act, 21 U.S.C. section 360-bbb-3(b)(1), unless the authorization is terminated or revoked sooner.  Performed at [facility], [address], [place], [place] [address]   Blood Culture  (routine x 2)     Status: None   Collection Time: 09/29/19  3:16 PM   Specimen: BLOOD  Result Value Ref Range Status   Specimen Description BLOOD LEFT ANTECUBITAL  Final   Special Requests   Final    BOTTLES DRAWN AEROBIC AND ANAEROBIC Blood Culture adequate volume   Culture   Final    NO GROWTH 5 DAYS Performed at [facility], [address], [place], [place] [address]    Report Status 10/04/2019 FINAL  Final  Blood Culture (routine x 2)     Status: None   Collection Time: 09/29/19  3:16 PM   Specimen: BLOOD  Result Value Ref Range Status   Specimen Description BLOOD LEFT ANTECUBITAL  Final   Special Requests   Final    BOTTLES DRAWN AEROBIC AND ANAEROBIC Blood Culture adequate volume   Culture   Final    NO GROWTH 5 DAYS Performed at [facility], [address], [place], [place] [address]    Report Status 10/04/2019 FINAL  Final     Scheduled Meds: . vitamin C  500 mg Oral Daily  . enoxaparin (LOVENOX) injection  80 mg Subcutaneous Q24H  . famotidine  20 mg Oral Daily  . [START ON 10/07/2019] predniSONE  40 mg Oral Q breakfast  . zinc sulfate  220 mg Oral Daily   Continuous Infusions: . sodium chloride 250 mL (09/29/19 1845)    Procedures/Studies: CT ANGIO CHEST PE W OR WO CONTRAST  Result Date: 10/03/2019 CLINICAL DATA:  Shortness of breath.  COVID positive. EXAM: CT ANGIOGRAPHY CHEST WITH CONTRAST TECHNIQUE: Multidetector CT imaging of the chest was performed using the standard protocol during bolus administration of intravenous contrast. Multiplanar CT image reconstructions and MIPs were obtained to evaluate the vascular anatomy. CONTRAST:  OMNIPAQUE IOHEXOL 350 MG/ML SOLN COMPARISON:  None. FINDINGS: Cardiovascular: Evaluation for pulmonary emboli is limited by respiratory motion artifact and patient body habitus. There is no pulmonary embolus or evidence of right heart strain. The size of the main pulmonary artery is normal. Heart size is normal, with no  pericardial effusion. The course and caliber of the aorta are normal. There is no atherosclerotic calcification. Opacification decreased due to pulmonary arterial phase contrast bolus timing. Mediastinum/Nodes: -- No mediastinal lymphadenopathy. -- No hilar lymphadenopathy. -- No axillary lymphadenopathy. -- No supraclavicular lymphadenopathy. -- Normal thyroid gland where visualized. -  Unremarkable esophagus. Lungs/Pleura: There are diffuse bilateral ground-glass airspace opacities without evidence for pneumothorax or significant pleural effusion. The trachea is unremarkable.  Upper Abdomen: Contrast bolus timing is not optimized for evaluation of the abdominal organs. There is probable hepatic steatosis. Musculoskeletal: No chest wall abnormality. No bony spinal canal stenosis. Review of the MIP images confirms the above findings. IMPRESSION: 1. Evaluation for pulmonary emboli is limited by respiratory motion artifact and patient body habitus. Given this limitation, there is no evidence for acute pulmonary embolus. 2. Diffuse bilateral ground-glass airspace opacities, consistent with the patient's history of viral pneumonia. 3. Probable hepatic steatosis. Electronically Signed   By: Katherine Mantle M.D.   On: 10/03/2019 17:42   DG Chest Port 1 View  Result Date: 09/29/2019 CLINICAL DATA:  Cough and shortness of breath.  Diffuse pain. EXAM: PORTABLE CHEST 1 VIEW COMPARISON:  None. FINDINGS: Borderline cardiomegaly. Heterogeneous bilateral lung opacities. No pneumothorax or large pleural effusion. Degenerative change of both acromioclavicular joints. No evidence of acute osseous abnormality. IMPRESSION: Heterogeneous bilateral lung opacities, suspicious for multifocal pneumonia, including COVID-19. Electronically Signed   By: Narda Rutherford M.D.   On: 09/29/2019 15:05    Erick Blinks, MD  [facility]  If 7PM-7AM, please contact night-coverage [email or web address]  10/06/2019, 8:29 PM   LOS: 7  days

## 2019-10-06 NOTE — Plan of Care (Signed)

## 2019-10-07 LAB — COMPREHENSIVE METABOLIC PANEL
ALT: 75 U/L — ABNORMAL HIGH (ref 0–44)
AST: 18 U/L (ref 15–41)
Albumin: 3.1 g/dL — ABNORMAL LOW (ref 3.5–5.0)
Alkaline Phosphatase: 44 U/L (ref 38–126)
Anion gap: 10 (ref 5–15)
BUN: 22 mg/dL — ABNORMAL HIGH (ref 6–20)
CO2: 28 mmol/L (ref 22–32)
Calcium: 9.3 mg/dL (ref 8.9–10.3)
Chloride: 97 mmol/L — ABNORMAL LOW (ref 98–111)
Creatinine, Ser: 1.1 mg/dL (ref 0.61–1.24)
GFR calc Af Amer: >60 mL/min (ref >60)
GFR calc non Af Amer: >60 mL/min (ref >60)
Glucose, Bld: 186 mg/dL — ABNORMAL HIGH (ref 70–99)
Potassium: 4.5 mmol/L (ref 3.5–5.1)
Sodium: 135 mmol/L (ref 135–145)
Total Bilirubin: 0.7 mg/dL (ref 0.3–1.2)
Total Protein: 7.5 g/dL (ref 6.5–8.1)

## 2019-10-07 LAB — CBC
HCT: 38.1 % — ABNORMAL LOW (ref 39.0–52.0)
Hemoglobin: 11.2 g/dL — ABNORMAL LOW (ref 13.0–17.0)
MCH: 20 pg — ABNORMAL LOW (ref 26.0–34.0)
MCHC: 29.4 g/dL — ABNORMAL LOW (ref 30.0–36.0)
MCV: 68 fL — ABNORMAL LOW (ref 80.0–100.0)
Platelets: 488 10*3/uL — ABNORMAL HIGH (ref 150–400)
RBC: 5.6 MIL/uL (ref 4.22–5.81)
RDW: 16.4 % — ABNORMAL HIGH (ref 11.5–15.5)
WBC: 22.2 10*3/uL — ABNORMAL HIGH (ref 4.0–10.5)
nRBC: 0 % (ref 0.0–0.2)

## 2019-10-07 LAB — C-REACTIVE PROTEIN: CRP: 0.6 mg/dL (ref <1.0)

## 2019-10-07 LAB — FERRITIN
Ferritin: 308 ng/mL (ref 24–336)
Ferritin: 321 ng/mL (ref 24–336)

## 2019-10-07 LAB — D-DIMER, QUANTITATIVE: D-Dimer, Quant: 0.46 ug/mL-FEU (ref 0.00–0.50)

## 2019-10-07 MED ORDER — GUAIFENESIN-DM 100-10 MG/5ML PO SYRP: 10.0000 mL | ORAL_SOLUTION | ORAL | 0 refills | Status: DC | PRN

## 2019-10-07 MED ORDER — ALBUTEROL SULFATE HFA 108 (90 BASE) MCG/ACT IN AERS: 2.0000 | INHALATION_SPRAY | Freq: Four times a day (QID) | RESPIRATORY_TRACT | 2 refills | Status: DC | PRN

## 2019-10-07 MED ORDER — PREDNISONE 20 MG PO TABS
40.0000 mg | ORAL_TABLET | Freq: Every day | ORAL | 0 refills | Status: DC
Start: 2019-10-08 — End: 2019-10-19

## 2019-10-07 NOTE — Plan of Care (Signed)

## 2019-10-07 NOTE — Discharge Summary (Signed)
Physician Discharge Summary  [name] JKD:[id number] DOB: 1975/09/16 DOA: 09/29/2019  PCP: [name], MD  Admit date: 09/29/2019 Discharge date: 10/07/2019  Admitted From: home Disposition:  home  Recommendations for Outpatient Follow-up:  1. Follow up with PCP in 1-2 weeks 2. Please obtain BMP/CBC in one week  Home Health: Equipment/Devices: oxygen at 2L  Discharge Condition:stable CODE STATUS:full code Diet recommendation: heart healthy  Brief/Interim Summary: 44 year old male with a history of hypertension and morbid obesity presenting with shortness of breath that began on 09/26/2019. He also complained of fatigue and myalgias with coughing some occasional yellow sputum. He has also had fevers and chills. He is not vaccinated for COVID-19. Because of worsening shortness of breath, the patient presented for further evaluation. He denies any nausea, vomiting, abdominal pain. He did have some loose stools without hematochezia or melena. He has had decreased oral intake. In the emergency department, the patient was febrile up to 1 to 3.4 F. He was hemodynamically stable. Oxygen saturation was in 80s on room air. He was placed on 5 L nasal cannula. The patient was started on steroids and remdesivir. Chest x-ray showed bilateral patchy infiltrates.remdesivir and baricitinib were stopped after 3 days due to rising LFTs  Discharge Diagnoses:  Principal Problem:   Pneumonia due to COVID-19 virus Active Problems:   Severe obesity (BMI >= 40) (HCC)   Acute respiratory failure with hypoxia (HCC)   Transaminitis   AKI (acute kidney injury) (HCC)   Microcytic anemia  Acute respiratory failure secondary to COVID-19 pneumonia -Patient was requiring large amounts of oxygen.  He since been weaned down to 2 L.  He is able to ambulate without difficulty. -CRP 19.3>>9.4>>7.5>>9.3>>4.3>>1.7>0.7 -Ferritin 392>>699>>609>>484>>357>380 -D-dimer  1.17>>0.72>>1.15>>0.87>>0.76 -PCT 0.25 -Follow blood cultures--neg to date -We will transition IV steroids to oral prednisone -d/c remdesivir due to rising LFTs>>improved -given final 2 doses remdesivir (last dose given 9/14) -disContinue baricitinibdue to rising LFTs -Lay prone if tolerated as much as possible -Incentive spirometry -continue vitamin C and Zinc -10/03/19--CTA chest--negative PE  Acute kidney injury -Baseline creatinine 1.0-1.3 -Secondary to volume depletion -Patient was fluid resuscitated-->improved   Microcytic anemia -iron saturation 9% -ferritin 495--difficult to interpret in setting of COVID 19 -total iron 26  Morbid obesity -BMI 59.04 -Lifestyle modification -At risk for complications secondary to COVID-19  Transaminasemia -Follow-up viral hepatitis panel--neg -Secondary to COVID-19 infection -No abdominal pain -Tolerating diet -d/c baricitinib and monitor -LFTs improving   Discharge Instructions  Discharge Instructions    Diet - low sodium heart healthy   Complete by: As directed    Increase activity slowly   Complete by: As directed      Allergies as of 10/07/2019   No Known Allergies     Medication List    STOP taking these medications   doxycycline 100 MG capsule Commonly known as: VIBRAMYCIN     TAKE these medications   albuterol 108 (90 Base) MCG/ACT inhaler Commonly known as: VENTOLIN HFA Inhale 2 puffs into the lungs every 6 (six) hours as needed for wheezing or shortness of breath.   guaiFENesin-dextromethorphan 100-10 MG/5ML syrup Commonly known as: ROBITUSSIN DM Take 10 mLs by mouth every 4 (four) hours as needed for cough.   predniSONE 20 MG tablet Commonly known as: DELTASONE Take 2 tablets (40 mg total) by mouth daily with breakfast. Start taking on: October 08, 2019            Durable Medical Equipment  (From admission, onward)  Start     Ordered   10/07/19 1516  For home use only DME  oxygen  Once       Question Answer Comment  Length of Need 6 Months   Mode or (Route) Nasal cannula   Liters per Minute 2   Frequency Continuous (stationary and portable oxygen unit needed)   Oxygen conserving device Yes   Oxygen delivery system Gas      10/07/19 1515          Follow-up Information    [facility], [facility] Follow up.   Why: Your oxygen is supplied by [facility].  Contact information: [address] [place] [id number] [phone]              No Known Allergies  Consultations:     Procedures/Studies: CT ANGIO CHEST PE W OR WO CONTRAST  Result Date: 10/03/2019 CLINICAL DATA:  Shortness of breath.  COVID positive. EXAM: CT ANGIOGRAPHY CHEST WITH CONTRAST TECHNIQUE: Multidetector CT imaging of the chest was performed using the standard protocol during bolus administration of intravenous contrast. Multiplanar CT image reconstructions and MIPs were obtained to evaluate the vascular anatomy. CONTRAST:  100mL OMNIPAQUE IOHEXOL 350 MG/ML SOLN COMPARISON:  None. FINDINGS: Cardiovascular: Evaluation for pulmonary emboli is limited by respiratory motion artifact and patient body habitus. There is no pulmonary embolus or evidence of right heart strain. The size of the main pulmonary artery is normal. Heart size is normal, with no pericardial effusion. The course and caliber of the aorta are normal. There is no atherosclerotic calcification. Opacification decreased due to pulmonary arterial phase contrast bolus timing. Mediastinum/Nodes: -- No mediastinal lymphadenopathy. -- No hilar lymphadenopathy. -- No axillary lymphadenopathy. -- No supraclavicular lymphadenopathy. -- Normal thyroid gland where visualized. -  Unremarkable esophagus. Lungs/Pleura: There are diffuse bilateral ground-glass airspace opacities without evidence for pneumothorax or significant pleural effusion. The trachea is unremarkable. Upper Abdomen: Contrast bolus timing is not  optimized for evaluation of the abdominal organs. There is probable hepatic steatosis. Musculoskeletal: No chest wall abnormality. No bony spinal canal stenosis. Review of the MIP images confirms the above findings. IMPRESSION: 1. Evaluation for pulmonary emboli is limited by respiratory motion artifact and patient body habitus. Given this limitation, there is no evidence for acute pulmonary embolus. 2. Diffuse bilateral ground-glass airspace opacities, consistent with the patient's history of viral pneumonia. 3. Probable hepatic steatosis. Electronically Signed   By: Katherine Mantlehristopher  Green M.D.   On: 10/03/2019 17:42   DG Chest Port 1 View  Result Date: 09/29/2019 CLINICAL DATA:  Cough and shortness of breath.  Diffuse pain. EXAM: PORTABLE CHEST 1 VIEW COMPARISON:  None. FINDINGS: Borderline cardiomegaly. Heterogeneous bilateral lung opacities. No pneumothorax or large pleural effusion. Degenerative change of both acromioclavicular joints. No evidence of acute osseous abnormality. IMPRESSION: Heterogeneous bilateral lung opacities, suspicious for multifocal pneumonia, including COVID-19. Electronically Signed   By: Narda RutherfordMelanie  Sanford M.D.   On: 09/29/2019 15:05       Subjective: Patient is feeling better.  He denies any shortness of breath.  Able to ambulate without difficulty.  Discharge Exam: Vitals:   10/06/19 1700 10/06/19 2100 10/07/19 0600 10/07/19 1458  BP:  (!) 141/94 (!) 147/91 (!) 142/95  Pulse:  95 93 (!) 107  Resp:  (!) 22 20 18   Temp:  99.2 F (37.3 C) 99 F (37.2 C) 98.9 F (37.2 C)  TempSrc:  Oral Oral Oral  SpO2: 95% 92% 93% (!) 89%  Weight:  Height:        General: Pt is alert, awake, not in acute distress Cardiovascular: RRR, S1/S2 +, no rubs, no gallops Respiratory: CTA bilaterally, no wheezing, no rhonchi Abdominal: Soft, NT, ND, bowel sounds + Extremities: no edema, no cyanosis    The results of significant diagnostics from this hospitalization (including  imaging, microbiology, ancillary and laboratory) are listed below for reference.     Microbiology: Recent Results (from the past 240 hour(s))  SARS Coronavirus 2 by RT PCR (hospital order, performed in [facility] hospital lab) Nasopharyngeal Nasopharyngeal Swab     Status: Abnormal   Collection Time: 09/29/19  2:23 PM   Specimen: Nasopharyngeal Swab  Result Value Ref Range Status   SARS Coronavirus 2 POSITIVE (A) NEGATIVE Final    Comment: RESULT CALLED TO, READ BACK BY AND VERIFIED WITH: HOLCUM @ 1715 ON [id number] BY HENDERSON L (NOTE) SARS-CoV-2 target nucleic acids are DETECTED  SARS-CoV-2 RNA is generally detectable in upper respiratory specimens  during the acute phase of infection.  Positive results are indicative  of the presence of the identified virus, but do not rule out bacterial infection or co-infection with other pathogens not detected by the test.  Clinical correlation with patient history and  other diagnostic information is necessary to determine patient infection status.  The expected result is negative.  Fact Sheet for Patients:   BoilerBrush.com.cy   Fact Sheet for Healthcare Providers:   https://pope.com/    This test is not yet approved or cleared by the [place] FDA and  has been authorized for detection and/or diagnosis of SARS-CoV-2 by FDA under an Emergency Use Authorization (EUA).  This EUA will remain in effect (meaning thi s test can be used) for the duration of  the COVID-19 declaration under Section 564(b)(1) of the Act, 21 U.S.C. section 360-bbb-3(b)(1), unless the authorization is terminated or revoked sooner.  Performed at [facility], [address], [place], [place] [address]   Blood Culture (routine x 2)     Status: None   Collection Time: 09/29/19  3:16 PM   Specimen: BLOOD  Result Value Ref Range Status   Specimen Description BLOOD LEFT ANTECUBITAL  Final   Special Requests   Final     BOTTLES DRAWN AEROBIC AND ANAEROBIC Blood Culture adequate volume   Culture   Final    NO GROWTH 5 DAYS Performed at [facility], [address] [place], [place] [address]    Report Status 10/04/2019 FINAL  Final  Blood Culture (routine x 2)     Status: None   Collection Time: 09/29/19  3:16 PM   Specimen: BLOOD  Result Value Ref Range Status   Specimen Description BLOOD LEFT ANTECUBITAL  Final   Special Requests   Final    BOTTLES DRAWN AEROBIC AND ANAEROBIC Blood Culture adequate volume   Culture   Final    NO GROWTH 5 DAYS Performed at [facility], [address] [place], [place] [address]    Report Status 10/04/2019 FINAL  Final     Labs: BNP (last 3 results) Recent Labs    09/30/19 0755  BNP 26.0   Basic Metabolic Panel: Recent Labs  Lab 10/01/19 0817 10/01/19 0817 10/02/19 0727 10/02/19 0727 10/03/19 0756 10/04/19 0738 10/05/19 0652 10/06/19 0647 10/07/19 0726  NA 139   < > 145   < > 141 141 139 138 135  K 5.1   < > 4.7   < > 4.6 4.5 4.6 4.7 4.5  CL 101   < > 103   < > 101 101 102 101 97*  CO2 28   < > 29   < > 28 27 26 25 28   GLUCOSE 164*   < > 169*   < > 160* 187* 175* 189* 186*  BUN 22*   < > 23*   < > 22* 23* 22* 22* 22*  CREATININE 1.19   < > 1.20   < > 1.08 1.13 1.06 1.12 1.10  CALCIUM 9.2   < > 9.5   < > 9.3 9.4 9.3 9.3 9.3  MG 2.7*  --  2.7*  --  2.5* 2.3  --   --   --    < > = values in this interval not displayed.   Liver Function Tests: Recent Labs  Lab 10/03/19 0756 10/04/19 0738 10/05/19 0652 10/06/19 0647 10/07/19 0726  AST 111* 45* 27 33 18  ALT 268* 175* 124* 104* 75*  ALKPHOS 59 53 48 49 44  BILITOT 0.5 0.5 0.4 0.5 0.7  PROT 8.3* 7.8 7.6 7.9 7.5  ALBUMIN 3.1* 3.1* 3.0* 3.1* 3.1*   No results for input(s): LIPASE, AMYLASE in the last 168 hours. No results for input(s): AMMONIA in the last 168 hours. CBC: Recent Labs  Lab 10/01/19 0817 10/01/19 0817 10/02/19 0727 10/02/19 0727 10/03/19 0756 10/04/19 0738 10/05/19 0652  10/06/19 0647 10/07/19 0726  WBC 12.3*   < > 14.3*   < > 16.9* 17.9* 18.4* 22.0* 22.2*  NEUTROABS 10.5*  --  11.9*  --  13.3* 13.0*  --   --   --   HGB 11.1*   < > 11.7*   < > 11.9* 11.6* 11.6* 11.7* 11.2*  HCT 37.9*   < > 40.7   < > 40.5 39.7 40.0 40.1 38.1*  MCV 68.8*   < > 69.0*   < > 68.6* 68.8* 69.0* 67.6* 68.0*  PLT 291   < > 323   < > 359 423* 465* 537* 488*   < > = values in this interval not displayed.   Cardiac Enzymes: No results for input(s): CKTOTAL, CKMB, CKMBINDEX, TROPONINI in the last 168 hours. BNP: Invalid input(s): POCBNP CBG: No results for input(s): GLUCAP in the last 168 hours. D-Dimer Recent Labs    10/06/19 0647 10/07/19 0726  DDIMER 0.66* 0.46   Hgb A1c No results for input(s): HGBA1C in the last 72 hours. Lipid Profile No results for input(s): CHOL, HDL, LDLCALC, TRIG, CHOLHDL, LDLDIRECT in the last 72 hours. Thyroid function studies No results for input(s): TSH, T4TOTAL, T3FREE, THYROIDAB in the last 72 hours.  Invalid input(s): FREET3 Anemia work up 10/09/19    10/06/19 0647 10/07/19 0726  FERRITIN 380* 308   321   Urinalysis No results found for: COLORURINE, APPEARANCEUR, LABSPEC, PHURINE, GLUCOSEU, HGBUR, BILIRUBINUR, KETONESUR, PROTEINUR, UROBILINOGEN, NITRITE, LEUKOCYTESUR Sepsis Labs Invalid input(s): PROCALCITONIN,  WBC,  LACTICIDVEN Microbiology Recent Results (from the past 240 hour(s))  SARS Coronavirus 2 by RT PCR (hospital order, performed in [facility] hospital lab) Nasopharyngeal Nasopharyngeal Swab     Status: Abnormal   Collection Time: 09/29/19  2:23 PM   Specimen: Nasopharyngeal Swab  Result Value Ref Range Status   SARS Coronavirus 2 POSITIVE (A) NEGATIVE Final    Comment: RESULT CALLED TO, READ BACK BY AND VERIFIED WITH: HOLCUM @ 1715 ON 11/29/19 BY HENDERSON L (NOTE) SARS-CoV-2 target nucleic acids are DETECTED  SARS-CoV-2 RNA is generally detectable in upper respiratory specimens  during the acute phase of  infection.  Positive results are indicative  of the presence of the identified virus, but do not rule out bacterial infection or co-infection with other pathogens not detected by the test.  Clinical correlation with patient history and  other diagnostic information is necessary to determine patient infection status.  The expected result is negative.  Fact Sheet for Patients:   BoilerBrush.com.cy   Fact Sheet for Healthcare Providers:   https://pope.com/    This test is not yet approved or cleared by the [place] FDA and  has been authorized for detection and/or diagnosis of SARS-CoV-2 by FDA under an Emergency Use Authorization (EUA).  This EUA will remain in effect (meaning thi s test can be used) for the duration of  the COVID-19 declaration under Section 564(b)(1) of the Act, 21 U.S.C. section 360-bbb-3(b)(1), unless the authorization is terminated or revoked sooner.  Performed at [facility], [address] [place], [place] [address]   Blood Culture (routine x 2)     Status: None   Collection Time: 09/29/19  3:16 PM   Specimen: BLOOD  Result Value Ref Range Status   Specimen Description BLOOD LEFT ANTECUBITAL  Final   Special Requests   Final    BOTTLES DRAWN AEROBIC AND ANAEROBIC Blood Culture adequate volume   Culture   Final    NO GROWTH 5 DAYS Performed at [facility], [address], [place], [place] [address]    Report Status 10/04/2019 FINAL  Final  Blood Culture (routine x 2)     Status: None   Collection Time: 09/29/19  3:16 PM   Specimen: BLOOD  Result Value Ref Range Status   Specimen Description BLOOD LEFT ANTECUBITAL  Final   Special Requests   Final    BOTTLES DRAWN AEROBIC AND ANAEROBIC Blood Culture adequate volume   Culture   Final    NO GROWTH 5 DAYS Performed at [facility], [address] [place], [place] [address]    Report Status 10/04/2019 FINAL  Final     Time coordinating discharge:   SIGNED:   Erick Blinks, MD  [facility] 10/07/2019, 7:33 PM   If 7PM-7AM, please contact night-coverage [email or web address]

## 2019-10-07 NOTE — TOC Transition Note (Signed)
Transition of Care [facility]) - CM/SW Discharge Note   Patient Details  Name: Ricardo Lopez. MRN: [id number] Date of Birth: 1975/12/15  Transition of Care [facility]) CM/SW Contact:  Annice Needy, LCSW Phone Number: 10/07/2019, 3:53 PM   Clinical Narrative:    Patient from home. Admitted for COVID+ symptom management. Needs home oxygen. [facility] accepts patient's insurance, referral made.   Final next level of care: Home/Self Care Barriers to Discharge: No Barriers Identified   Patient Goals and CMS Choice Patient states their goals for this hospitalization and ongoing recovery are:: home      Discharge Placement                       Discharge Plan and Services     Post Acute Care Choice: Durable Medical Equipment          DME Arranged: Oxygen DME Agency: [facility] Date DME Agency Contacted: 10/07/19 Time DME Agency Contacted: 1975 Representative spoke with at DME Agency: [facility]            Social Determinants of Health (SDOH) Interventions     Readmission Risk Interventions No flowsheet data found.

## 2019-10-07 NOTE — Progress Notes (Signed)
SATURATION QUALIFICATIONS: (This note is used to comply with regulatory documentation for home oxygen)  Patient Saturations on Room Air at Rest = 87%  Patient Saturations on Room Air while Ambulating = 87%  Patient Saturations on 2Liters of oxygen while Ambulating = 95%  Please briefly explain why patient needs home oxygen: to maintain oxygen saturation greater than 90%.

## 2019-10-19 ENCOUNTER — Other Ambulatory Visit: Payer: Self-pay

## 2019-10-19 ENCOUNTER — Ambulatory Visit
Admission: EM | Admit: 2019-10-19 | Discharge: 2019-10-19 | Disposition: A | Discharge status: Home / Self Care | Payer: 59 | Attending: Emergency Medicine | Admitting: Emergency Medicine

## 2019-10-19 ENCOUNTER — Ambulatory Visit (INDEPENDENT_AMBULATORY_CARE_PROVIDER_SITE_OTHER): Payer: 59

## 2019-10-19 ENCOUNTER — Encounter (HOSPITAL_COMMUNITY): Payer: Self-pay

## 2019-10-19 ENCOUNTER — Inpatient Hospital Stay (HOSPITAL_COMMUNITY)
Admission: EM | Admit: 2019-10-19 | Discharge: 2019-10-22 | DRG: 175 | Disposition: A | Discharge status: Home / Self Care | Payer: 59 | Attending: Family Medicine | Admitting: Family Medicine

## 2019-10-19 ENCOUNTER — Emergency Department (HOSPITAL_COMMUNITY): Payer: 59

## 2019-10-19 DIAGNOSIS — U071 COVID-19: Secondary | ICD-10-CM | POA: Diagnosis present

## 2019-10-19 DIAGNOSIS — Z6841 Body Mass Index (BMI) 40.0 and over, adult: Secondary | ICD-10-CM

## 2019-10-19 DIAGNOSIS — J9601 Acute respiratory failure with hypoxia: Secondary | ICD-10-CM

## 2019-10-19 DIAGNOSIS — R0602 Shortness of breath: Secondary | ICD-10-CM

## 2019-10-19 DIAGNOSIS — I10 Essential (primary) hypertension: Secondary | ICD-10-CM | POA: Diagnosis present

## 2019-10-19 DIAGNOSIS — I2699 Other pulmonary embolism without acute cor pulmonale: Principal | ICD-10-CM | POA: Diagnosis present

## 2019-10-19 DIAGNOSIS — Z79899 Other long term (current) drug therapy: Secondary | ICD-10-CM | POA: Diagnosis not present

## 2019-10-19 DIAGNOSIS — E8809 Other disorders of plasma-protein metabolism, not elsewhere classified: Secondary | ICD-10-CM | POA: Diagnosis not present

## 2019-10-19 DIAGNOSIS — B9620 Unspecified Escherichia coli [E. coli] as the cause of diseases classified elsewhere: Secondary | ICD-10-CM | POA: Diagnosis present

## 2019-10-19 DIAGNOSIS — R7881 Bacteremia: Secondary | ICD-10-CM | POA: Diagnosis present

## 2019-10-19 DIAGNOSIS — J1282 Pneumonia due to coronavirus disease 2019: Secondary | ICD-10-CM | POA: Diagnosis present

## 2019-10-19 DIAGNOSIS — D649 Anemia, unspecified: Secondary | ICD-10-CM | POA: Diagnosis present

## 2019-10-19 DIAGNOSIS — R05 Cough: Secondary | ICD-10-CM

## 2019-10-19 DIAGNOSIS — I272 Pulmonary hypertension, unspecified: Secondary | ICD-10-CM | POA: Diagnosis not present

## 2019-10-19 DIAGNOSIS — R059 Cough, unspecified: Secondary | ICD-10-CM

## 2019-10-19 DIAGNOSIS — D509 Iron deficiency anemia, unspecified: Secondary | ICD-10-CM | POA: Diagnosis present

## 2019-10-19 DIAGNOSIS — J069 Acute upper respiratory infection, unspecified: Secondary | ICD-10-CM | POA: Diagnosis present

## 2019-10-19 DIAGNOSIS — R739 Hyperglycemia, unspecified: Secondary | ICD-10-CM | POA: Diagnosis present

## 2019-10-19 DIAGNOSIS — E440 Moderate protein-calorie malnutrition: Secondary | ICD-10-CM | POA: Diagnosis present

## 2019-10-19 LAB — COMPREHENSIVE METABOLIC PANEL
ALT: 29 U/L (ref 0–44)
AST: 19 U/L (ref 15–41)
Albumin: 2.8 g/dL — ABNORMAL LOW (ref 3.5–5.0)
Alkaline Phosphatase: 71 U/L (ref 38–126)
Anion gap: 12 (ref 5–15)
BUN: 9 mg/dL (ref 6–20)
CO2: 28 mmol/L (ref 22–32)
Calcium: 8.8 mg/dL — ABNORMAL LOW (ref 8.9–10.3)
Chloride: 96 mmol/L — ABNORMAL LOW (ref 98–111)
Creatinine, Ser: 0.9 mg/dL (ref 0.61–1.24)
GFR calc Af Amer: >60 mL/min (ref >60)
GFR calc non Af Amer: >60 mL/min (ref >60)
Glucose, Bld: 156 mg/dL — ABNORMAL HIGH (ref 70–99)
Potassium: 4.2 mmol/L (ref 3.5–5.1)
Sodium: 136 mmol/L (ref 135–145)
Total Bilirubin: 0.7 mg/dL (ref 0.3–1.2)
Total Protein: 7.3 g/dL (ref 6.5–8.1)

## 2019-10-19 LAB — CBC WITH DIFFERENTIAL/PLATELET
Abs Immature Granulocytes: 0.04 10*3/uL (ref 0.00–0.07)
Basophils Absolute: 0 10*3/uL (ref 0.0–0.1)
Basophils Relative: 0 %
Eosinophils Absolute: 0.1 10*3/uL (ref 0.0–0.5)
Eosinophils Relative: 0 %
HCT: 31.1 % — ABNORMAL LOW (ref 39.0–52.0)
Hemoglobin: 9.3 g/dL — ABNORMAL LOW (ref 13.0–17.0)
Immature Granulocytes: 0 %
Lymphocytes Relative: 10 %
Lymphs Abs: 1.1 10*3/uL (ref 0.7–4.0)
MCH: 20.5 pg — ABNORMAL LOW (ref 26.0–34.0)
MCHC: 29.9 g/dL — ABNORMAL LOW (ref 30.0–36.0)
MCV: 68.5 fL — ABNORMAL LOW (ref 80.0–100.0)
Monocytes Absolute: 1.1 10*3/uL — ABNORMAL HIGH (ref 0.1–1.0)
Monocytes Relative: 10 %
Neutro Abs: 9.1 10*3/uL — ABNORMAL HIGH (ref 1.7–7.7)
Neutrophils Relative %: 80 %
Platelets: 171 10*3/uL (ref 150–400)
RBC: 4.54 MIL/uL (ref 4.22–5.81)
RDW: 16.9 % — ABNORMAL HIGH (ref 11.5–15.5)
WBC: 11.4 10*3/uL — ABNORMAL HIGH (ref 4.0–10.5)
nRBC: 0 % (ref 0.0–0.2)

## 2019-10-19 LAB — C-REACTIVE PROTEIN: CRP: 22.3 mg/dL — ABNORMAL HIGH (ref <1.0)

## 2019-10-19 LAB — PROCALCITONIN: Procalcitonin: 0.13 ng/mL

## 2019-10-19 LAB — LACTATE DEHYDROGENASE: LDH: 174 U/L (ref 98–192)

## 2019-10-19 LAB — FIBRINOGEN: Fibrinogen: >800 mg/dL — ABNORMAL HIGH (ref 210–475)

## 2019-10-19 LAB — TRIGLYCERIDES: Triglycerides: 59 mg/dL (ref <150)

## 2019-10-19 LAB — LACTIC ACID, PLASMA: Lactic Acid, Venous: 1.5 mmol/L (ref 0.5–1.9)

## 2019-10-19 LAB — D-DIMER, QUANTITATIVE: D-Dimer, Quant: 1.59 ug/mL-FEU — ABNORMAL HIGH (ref 0.00–0.50)

## 2019-10-19 LAB — FERRITIN: Ferritin: 233 ng/mL (ref 24–336)

## 2019-10-19 MED ORDER — INSULIN ASPART 100 UNIT/ML ~~LOC~~ SOLN
0.0000 [IU] | Freq: Three times a day (TID) | SUBCUTANEOUS | Status: DC
  Administered 2019-10-20 (×3): 2 [IU] via SUBCUTANEOUS
  Administered 2019-10-21: 3 [IU] via SUBCUTANEOUS
  Administered 2019-10-21: 2 [IU] via SUBCUTANEOUS
  Administered 2019-10-21: 1 [IU] via SUBCUTANEOUS
  Administered 2019-10-22 (×2): 2 [IU] via SUBCUTANEOUS
  Filled 2019-10-19 (×3): qty 1

## 2019-10-19 MED ORDER — GUAIFENESIN-DM 100-10 MG/5ML PO SYRP
10.0000 mL | ORAL_SOLUTION | ORAL | Status: DC | PRN
  Administered 2019-10-20: 10 mL via ORAL
  Filled 2019-10-19: qty 10

## 2019-10-19 MED ORDER — HYDROCOD POLST-CPM POLST ER 10-8 MG/5ML PO SUER
5.0000 mL | Freq: Two times a day (BID) | ORAL | Status: DC | PRN
  Administered 2019-10-21 – 2019-10-22 (×3): 5 mL via ORAL
  Filled 2019-10-19 (×3): qty 5

## 2019-10-19 MED ORDER — ASCORBIC ACID 500 MG PO TABS
500.0000 mg | ORAL_TABLET | Freq: Every day | ORAL | Status: DC
  Administered 2019-10-19 – 2019-10-22 (×4): 500 mg via ORAL
  Filled 2019-10-19 (×4): qty 1

## 2019-10-19 MED ORDER — ACETAMINOPHEN 500 MG PO TABS
1000.0000 mg | ORAL_TABLET | Freq: Once | ORAL | Status: AC
  Administered 2019-10-19: 1000 mg via ORAL
  Filled 2019-10-19: qty 2

## 2019-10-19 MED ORDER — ENOXAPARIN SODIUM 150 MG/ML ~~LOC~~ SOLN
150.0000 mg | Freq: Once | SUBCUTANEOUS | Status: AC
  Administered 2019-10-19: 150 mg via SUBCUTANEOUS
  Filled 2019-10-19: qty 1

## 2019-10-19 MED ORDER — METHYLPREDNISOLONE SODIUM SUCC 40 MG IJ SOLR
40.0000 mg | Freq: Two times a day (BID) | INTRAMUSCULAR | Status: DC
  Administered 2019-10-20: 40 mg via INTRAVENOUS
  Filled 2019-10-19: qty 1

## 2019-10-19 MED ORDER — DM-GUAIFENESIN ER 30-600 MG PO TB12
1.0000 | ORAL_TABLET | Freq: Two times a day (BID) | ORAL | Status: DC
  Administered 2019-10-20 – 2019-10-22 (×6): 1 via ORAL
  Filled 2019-10-19 (×6): qty 1

## 2019-10-19 MED ORDER — ZINC SULFATE 220 (50 ZN) MG PO CAPS
220.0000 mg | ORAL_CAPSULE | Freq: Every day | ORAL | Status: DC
  Administered 2019-10-19 – 2019-10-22 (×4): 220 mg via ORAL
  Filled 2019-10-19 (×4): qty 1

## 2019-10-19 MED ORDER — METHYLPREDNISOLONE SODIUM SUCC 125 MG IJ SOLR
125.0000 mg | Freq: Once | INTRAMUSCULAR | Status: AC
  Administered 2019-10-19: 125 mg via INTRAVENOUS
  Filled 2019-10-19: qty 2

## 2019-10-19 MED ORDER — ALBUTEROL SULFATE HFA 108 (90 BASE) MCG/ACT IN AERS
2.0000 | INHALATION_SPRAY | Freq: Four times a day (QID) | RESPIRATORY_TRACT | Status: DC
  Administered 2019-10-19 – 2019-10-22 (×12): 2 via RESPIRATORY_TRACT
  Filled 2019-10-19: qty 6.7

## 2019-10-19 MED ORDER — BARICITINIB 2 MG PO TABS
4.0000 mg | ORAL_TABLET | Freq: Every day | ORAL | Status: DC
  Administered 2019-10-19 – 2019-10-22 (×4): 4 mg via ORAL
  Filled 2019-10-19 (×4): qty 2

## 2019-10-19 MED ORDER — IOHEXOL 350 MG/ML SOLN
100.0000 mL | Freq: Once | INTRAVENOUS | Status: AC | PRN
  Administered 2019-10-19: 180 mL via INTRAVENOUS

## 2019-10-19 MED ORDER — INSULIN ASPART 100 UNIT/ML ~~LOC~~ SOLN: 0.0000 [IU] | Freq: Every day | SUBCUTANEOUS | Status: DC

## 2019-10-19 MED ORDER — GLUCERNA SHAKE PO LIQD
237.0000 mL | Freq: Three times a day (TID) | ORAL | Status: DC
  Administered 2019-10-20 – 2019-10-21 (×4): 237 mL via ORAL
  Filled 2019-10-19 (×5): qty 237

## 2019-10-19 NOTE — ED Provider Notes (Signed)
[facility]   [id number] 10/19/19 Arrival Time: 1528   CC: COVID infection  SUBJECTIVE: History from: patient.  Ricardo Lopez. is a 44 y.o. male who presents with persistent intermittent productive cough and SOB x 23 days.  Tested positive for COVID on 09/29/19.  Was admitted to ED.  Discharged on albuterol, robitussin and prednisone.  On 2 L of oxygen.  States symptoms have persisted and worsened.  Symptoms are made worse with activity.  Reports fatigue, and fever, tmax in office of 100.1.  Denies chills, sinus pain, rhinorrhea, sore throat, wheezing, chest pain, nausea, changes in bowel or bladder habits.    ROS: As per HPI.  All other pertinent ROS negative.     Past Medical History:  Diagnosis Date  . Hypertension   . Obesity    History reviewed. No pertinent surgical history. No Known Allergies No current facility-administered medications on file prior to encounter.   Current Outpatient Medications on File Prior to Encounter  Medication Sig Dispense Refill  . albuterol (VENTOLIN HFA) 108 (90 Base) MCG/ACT inhaler Inhale 2 puffs into the lungs every 6 (six) hours as needed for wheezing or shortness of breath. 8 g 2  . guaiFENesin-dextromethorphan (ROBITUSSIN DM) 100-10 MG/5ML syrup Take 10 mLs by mouth every 4 (four) hours as needed for cough. 118 mL 0   Social History   Socioeconomic History  . Marital status: Single    Spouse name: Not on file  . Number of children: Not on file  . Years of education: Not on file  . Highest education level: Not on file  Occupational History  . Not on file  Tobacco Use  . Smoking status: Never Smoker  . Smokeless tobacco: Never Used  Vaping Use  . Vaping Use: Never used  Substance and Sexual Activity  . Alcohol use: Yes    Comment: occasional  . Drug use: No  . Sexual activity: Never  Other Topics Concern  . Not on file  Social History Narrative  . Not on file   Social Determinants of Health   Financial  Resource Strain:   . Difficulty of Paying Living Expenses: Not on file  Food Insecurity:   . Worried About Programme researcher, broadcasting/film/video in the Last Year: Not on file  . Ran Out of Food in the Last Year: Not on file  Transportation Needs:   . Lack of Transportation (Medical): Not on file  . Lack of Transportation (Non-Medical): Not on file  Physical Activity:   . Days of Exercise per Week: Not on file  . Minutes of Exercise per Session: Not on file  Stress:   . Feeling of Stress : Not on file  Social Connections:   . Frequency of Communication with Friends and Family: Not on file  . Frequency of Social Gatherings with Friends and Family: Not on file  . Attends Religious Services: Not on file  . Active Member of Clubs or Organizations: Not on file  . Attends Banker Meetings: Not on file  . Marital Status: Not on file  Intimate Partner Violence:   . Fear of Current or Ex-Partner: Not on file  . Emotionally Abused: Not on file  . Physically Abused: Not on file  . Sexually Abused: Not on file   History reviewed. No pertinent family history.  OBJECTIVE:  Vitals:   10/19/19 1544 10/19/19 1548  BP: 131/85   Pulse: (!) 126   Resp: (!) 26   Temp: 100.1 F (  37.8 C)   SpO2: 90% 90%    General appearance: alert; appears fatigued, sitting in wheelchair on 2 L of oxygen; speaking in full sentences and tolerating own secretions HEENT: NCAT; Ears: EACs clear; Eyes: EOM grossly intact. Nose: nares patent without rhinorrhea, Throat: oropharynx clear, tonsils non erythematous or enlarged, uvula midline  Neck: supple without LAD Lungs: decreased breath sounds throughout bilateral lung fields Heart: Tachycardia Skin: warm and dry Psychological: alert and cooperative; normal mood and affect  DIAGNOSTIC STUDIES: DG Chest 2 View  Result Date: 10/19/2019 CLINICAL DATA:  Persistent cough post discharge with COVID. EXAM: CHEST - 2 VIEW COMPARISON:  09/29/2019 FINDINGS: Extensive bilateral  airspace opacities. No visible pleural effusions or pneumothorax. Cardiac silhouette is enlarged and accentuated by low lung volumes. No evidence of acute osseous abnormality. IMPRESSION: 1. Extensive bilateral airspace opacities, compatible with multifocal pneumonia. 2. Suspected cardiomegaly. Electronically Signed   By: Feliberto Harts MD   On: 10/19/2019 16:24    X-rays significant for bilateral airspace opacities  I have reviewed the x-rays myself and the radiologist interpretation. I am in agreement with the radiologist interpretation.     ASSESSMENT & PLAN:  1. COVID-19 virus infection   2. Cough   3. Shortness of breath   4. Acute respiratory failure with hypoxia (HCC)     Recommending further evaluation with worsening symptoms secondary to COVID.  Patient and family aware and in agreement with plan.  Requests EMS transport given low oxygen in office.            Rennis Harding, PA-C 10/19/19 1634

## 2019-10-19 NOTE — ED Notes (Signed)
Patient is being discharged from the Urgent Care and sent to the Emergency Department via EMS . Per B Wurst, patient is in need of higher level of care due to worsening pnuemonia . Patient is aware and verbalizes understanding of plan of care.  Vitals:   10/19/19 1544 10/19/19 1548  BP: 131/85   Pulse: (!) 126   Resp: (!) 26   Temp: 100.1 F (37.8 C)   SpO2: 90% 90%

## 2019-10-19 NOTE — ED Triage Notes (Signed)
Pt presents with persistent cough post discharge with covid, states that is now having rectal pain with cough, called nurse triage and suggested repeat chest x ray

## 2019-10-19 NOTE — Discharge Instructions (Signed)
Recommending further evaluation with worsening symptoms secondary to COVID.  Patient and family aware and in agreement with plan.  Will travel by private vehicle.

## 2019-10-19 NOTE — ED Notes (Signed)
Hospitalist provided pt wife's phone number who requests to be contacted with an update

## 2019-10-19 NOTE — H&P (Addendum)
History and Physical  Ricardo Parkobert E Crite Jr. JXB:[id number] DOB: 10/28/1975 DOA: 10/19/2019  Referring physician: Burgess AmorIdol, Julie, PA-C PCP: Donita BrooksPickard, Warren T, MD  Patient coming from: Home  Chief Complaint: Shortness of breath  HPI: Ricardo ParkRobert E Allston Jr. is a 44 y.o. male with medical history significant for hypertension and obesity who presents to the emergency department due to worsening chest congestion.  Patient was recently admitted and discharged from this facility from 9/8-9/16 due to acute respiratory failure secondary to COVID-19 pneumonia during which he was treated with IV remdesivir (eventually completed doses) and baricitinib (which unfortunately was discontinued due to elevated LFTs at that time) and was discharged with supplemental oxygen at 2 LPM via [place].  Patient presents to an urgent care today due to worsening chest congestion with increased productive cough and weakness since yesterday, chest x-ray done at urgent care showed worsening multifocal pneumonia and patient was asked to go to ED for further evaluation management.  He denies chest pain, headache, nausea, vomiting or abdominal pain.  ED Course:  In the emergency department, he was febrile with a temperature of 101.55F, patient was also tachycardic and tachypneic.  Work-up in the ED showed leukocytosis, microcytic anemia and hyperglycemia.  Albumin 2.8, fibrinogen > 800 (789, 2 weeks ago), CRP 22.3 (0.6, 12 days ago), D-dimer 1.59, ferritin 233.  CT angiography of chest ruled out large central pulmonary emboli, but suggestive of sublobar pulmonary artery emboli with no significant change in severe bilateral pulmonary airspace opacity, consistent with viral pneumonia.  Chest x-ray shows persistent moderate to marked severity bilateral infiltrates.  She was treated with Tylenol and IV Solu-Medrol 25 Mg x1 was given.  Hospitalist was asked to admit patient for further evaluation management.  Review of Systems: Constitutional: Positive for  fatigue, chills and fever.  HENT: Negative for ear pain and sore throat.   Eyes: Negative for pain and visual disturbance.  Respiratory: Positive for cough and shortness of breath.  Cardiovascular: Negative for chest pain and palpitations.  Gastrointestinal: Negative for abdominal pain and vomiting.  Endocrine: Negative for polyphagia and polyuria.  Genitourinary: Negative for decreased urine volume, dysuria, enuresis, hematuria, vaginal discharge and vaginal pain.  Musculoskeletal: Negative for arthralgias and back pain.  Skin: Negative for color change and rash.  Allergic/Immunologic: Negative for immunocompromised state.  Neurological: Positive for generalized weakness.  Negative for tremors, syncope, speech difficulty, light-headedness and headaches.  Hematological: Does not bruise/bleed easily.  All other systems reviewed and are negative   Past Medical History:  Diagnosis Date  . Hypertension   . Obesity    History reviewed. No pertinent surgical history.  Social History:  reports that he has never smoked. He has never used smokeless tobacco. He reports current alcohol use. He reports that he does not use drugs.   No Known Allergies  No family history on file.   Prior to Admission medications   Medication Sig Start Date End Date Taking? Authorizing Provider  acetaminophen (TYLENOL) 325 MG tablet Take 650 mg by mouth every 6 (six) hours as needed.   Yes [provider]  albuterol (VENTOLIN HFA) 108 (90 Base) MCG/ACT inhaler Inhale 2 puffs into the lungs every 6 (six) hours as needed for wheezing or shortness of breath. 10/07/19  Yes Erick BlinksMemon, Jehanzeb, MD  guaiFENesin-dextromethorphan (ROBITUSSIN DM) 100-10 MG/5ML syrup Take 10 mLs by mouth every 4 (four) hours as needed for cough. 10/07/19  Yes Erick BlinksMemon, Jehanzeb, MD  Multiple Vitamin (MULTIVITAMIN) tablet Take 1 tablet by mouth daily.  Yes [provider]    Physical Exam: BP (!) 114/56   Pulse (!) 133    Temp (!) 101.6 F (38.7 C) (Oral)   Resp (!) 29   Ht 5\' 8"  (1.727 m)   Wt (!) 158.8 kg   SpO2 94%   BMI 53.22 kg/m   . General: 44 y.o. year-old male obese, ill appearing, but in no acute distress.  Alert and oriented x3. 55 HEENT: NCAT, EOMI . Neck: Supple, trachea medial . Cardiovascular: Tachycardia.  Regular rate and rhythm with no rubs or gallops.  No thyromegaly or JVD noted.  No lower extremity edema. 2/4 pulses in all 4 extremities. Marland Kitchen Respiratory: Tachypnea.  Decreased breath sounds bilaterally.  . Abdomen: Soft nontender nondistended with normal bowel sounds x4 quadrants. . Muskuloskeletal: No cyanosis, clubbing or edema noted bilaterally . Neuro: CN II-XII intact, strength, sensation, reflexes . Skin: No ulcerative lesions noted or rashes . Psychiatry: Judgement and insight appear normal. Mood is appropriate for condition and setting          Labs on Admission:  Basic Metabolic Panel: Recent Labs  Lab 10/19/19 1713  NA 136  K 4.2  CL 96*  CO2 28  GLUCOSE 156*  BUN 9  CREATININE 0.90  CALCIUM 8.8*   Liver Function Tests: Recent Labs  Lab 10/19/19 1713  AST 19  ALT 29  ALKPHOS 71  BILITOT 0.7  PROT 7.3  ALBUMIN 2.8*   No results for input(s): LIPASE, AMYLASE in the last 168 hours. No results for input(s): AMMONIA in the last 168 hours. CBC: Recent Labs  Lab 10/19/19 1713  WBC 11.4*  NEUTROABS 9.1*  HGB 9.3*  HCT 31.1*  MCV 68.5*  PLT 171   Cardiac Enzymes: No results for input(s): CKTOTAL, CKMB, CKMBINDEX, TROPONINI in the last 168 hours.  BNP (last 3 results) Recent Labs    09/30/19 0755  BNP 26.0    ProBNP (last 3 results) No results for input(s): PROBNP in the last 8760 hours.  CBG: No results for input(s): GLUCAP in the last 168 hours.  Radiological Exams on Admission: DG Chest 2 View  Result Date: 10/19/2019 CLINICAL DATA:  Persistent cough post discharge with COVID. EXAM: CHEST - 2 VIEW COMPARISON:  09/29/2019 FINDINGS:  Extensive bilateral airspace opacities. No visible pleural effusions or pneumothorax. Cardiac silhouette is enlarged and accentuated by low lung volumes. No evidence of acute osseous abnormality. IMPRESSION: 1. Extensive bilateral airspace opacities, compatible with multifocal pneumonia. 2. Suspected cardiomegaly. Electronically Signed   By: 11/29/2019 MD   On: 10/19/2019 16:24   CT Angio Chest PE W and/or Wo Contrast  Result Date: 10/19/2019 CLINICAL DATA:  Cough and shortness of breath. COVID-19 virus infection. EXAM: CT ANGIOGRAPHY CHEST WITH CONTRAST TECHNIQUE: Multidetector CT imaging of the chest was performed using the standard protocol during bolus administration of intravenous contrast. Multiplanar CT image reconstructions and MIPs were obtained to evaluate the vascular anatomy. Study was performed twice due to suboptimal opacification of pulmonary arteries. CONTRAST:  10/21/2019 OMNIPAQUE IOHEXOL 350 MG/ML SOLN COMPARISON:  10/03/2019 FINDINGS: Cardiovascular: Suboptimal contrast opacification of the pulmonary arteries is demonstrated on both scans, likely due to contrast injection triggering being compromised by patient breathing during the exam, as well as pulmonary airspace disease. No large central pulmonary emboli are identified, however pulmonary emboli in sub lobar pulmonary arteries cannot be excluded on this exam. Mediastinum/Nodes: No masses or pathologically enlarged lymph nodes identified. Lungs/Pleura: Heterogeneous airspace opacity is again seen throughout both  lungs, without significant change since prior exam. No evidence of parenchymal cavitation or pleural effusion. Upper abdomen: No acute findings. Musculoskeletal: No suspicious bone lesions identified. Review of the MIP images confirms the above findings. IMPRESSION: Suboptimal contrast opacification of pulmonary arteries. No large central pulmonary emboli identified, however pulmonary emboli in sub-lobar pulmonary arteries cannot  be excluded. No significant change in severe bilateral pulmonary airspace opacity, consistent with viral pneumonia. Electronically Signed   By: Danae Orleans M.D.   On: 10/19/2019 19:45   DG Chest Port 1 View  Result Date: 10/19/2019 CLINICAL DATA:  Shortness of breath. COVID positive. EXAM: PORTABLE CHEST 1 VIEW COMPARISON:  October 19, 2019 (3:56 p.m.) FINDINGS: Persistent moderate to marked severity bilateral infiltrates are seen. Very mildly improved aeration of both lungs is noted. There is no evidence of a pleural effusion or pneumothorax. The cardiac silhouette is mildly enlarged and unchanged in size. The visualized skeletal structures are unremarkable. IMPRESSION: Persistent moderate to marked severity bilateral infiltrates. Electronically Signed   By: Aram Candela M.D.   On: 10/19/2019 18:37    EKG: I independently viewed the EKG done and my findings are as followed: Sinus tachycardia at rate of 134 bpm  Assessment/Plan Present on Admission: . Acute pulmonary embolism (HCC) . Pneumonia due to COVID-19 virus . Severe obesity (BMI >= 40) (HCC) . Hypertension . Microcytic anemia  Principal Problem:   Acute pulmonary embolism (HCC) Active Problems:   Severe obesity (BMI >= 40) (HCC)   Hypertension   Pneumonia due to COVID-19 virus   Microcytic anemia   Hyperglycemia   Hypoalbuminemia   Acute respiratory failure possibly secondary to presumed new onset pulmonary embolism in the setting of persistent COVID-19 viral pneumonia Patient presents with worsening hypoxia with requirement of 4 LPM of oxygen via [place] (versus 2 LPM on discharge). CT angiography of chest ruled out large central pulmonary emboli, but suggestive of sublobar pulmonary artery emboli with no significant change in severe bilateral pulmonary airspace opacity, consistent with viral pneumonia.   Chest x-ray shows persistent moderate to marked severity bilateral infiltrates.  Echocardiogram will be done in the  morning Therapeutic dose of Lovenox was given in the ED with plan to possibly transition patient to DOAC in the morning Continue albuterol q.6h IV Solu-Medrol 25 Mg x1 was given in the ED, we shall continue with Solu-Medrol at this time Patient completed remdesivir during last admission, but he was unable to complete baricitinib due to elevated LFTs at that time.  This will be restarted at this time and we shall continue to monitor LFTs. Continue vitamin-C 500 mg p.o. Daily Continue zinc 220 mg p.o. Daily Continue Mucinex, Robitussin and Tussionex Continue Tylenol p.r.n. for fever Continue supplemental oxygen to maintain O2 sat > or = 94% with plan to wean patient off supplemental oxygen as tolerated (of note, patient does not use oxygen at baseline) Continue incentive spirometry and flutter valve q43min as tolerated Encourage proning, early ambulation, and side laying as tolerated Continue airborne isolation precaution Inflammatory markers: Fibrinogen > 800 (789, 2 weeks ago) CRP 22.3 (0.6, 12 days ago) D-dimer 1.59 Ferritin 233 Continue monitoring daily inflammatory markers Physician PPE:  Surgical mask with face shield, N-95, nonsterile gloves, disposable gown, head and shoe cover s Patient PPE:  Face mask   Hyperglycemia possible secondary to steroid effect Continue insulin sliding scale and hypoglycemic protocol  Hypoalbuminemia secondary to moderate protein calorie malnutrition Albumin 2.8, protein supplement will be provided  Microcytic anemia Ferritin level was normal  TIBC and iron level will be checked  Severe obesity (BMI 53.23) This was calculated based on the following: Height as of this encounter: 5\' 8"  (1.727 m). Weight as of this encounter: 158.8 kg.   DVT prophylaxis: Lovenox  Code Status: Full code  Family Communication: Wife on the phone (all questions answered to satisfaction)  Disposition Plan:  Patient is from:                        home Anticipated  DC to:                   SNF or family members home Anticipated DC date:               2-3 days Anticipated DC barriers:          Patient was stable for discharge at this time due to worsening hypoxia and presumed PE   Consults called: None  Admission status: Inpatient    MD [facility]  If 7PM-7AM, please contact night-coverage [email or web address] Password [facility]  10/19/2019, 10:28 PM

## 2019-10-19 NOTE — ED Provider Notes (Signed)
[facility] EMERGENCY DEPARTMENT Provider Note   CSN: [id number] Arrival date & time: 10/19/19  1653     History Chief Complaint  Patient presents with  . Shortness of Breath    Ricardo ParkRobert E Adriance Jr. is a 44 y.o. male with a history of HTN and obesity, diagnosed and admitted for Covid 19 and acute respiratory failure on 9/8, discharged on 9/16, returns today due to increased productive cough and shortness of breath and weakness, significantly worse since yesterday.  He was [facility] at [facility] prior to arrival here with chest xray revealing for worsened multifocal pneumonia.  He was discharged home with oxygen at 2L and despite this tx, oxygen saturation at 90%.  He has been febrile, denies chest pain, no pleuritic pain with cough of inspiration.  He was discharged also on albuterol and prednisone, robitussin for cough.  Fever here 101.4, tachypnea, tachycardia.  Also received steroids during prior admission, remdesivir was dc'd secondary elevation in LFT's. He reports feeling comfortable with his breathing at rest, any exertion causes severe sob.  No leg edema or pain.    HPI     Past Medical History:  Diagnosis Date  . Hypertension   . Obesity     Patient Active Problem List   Diagnosis Date Noted  . Pneumonia due to COVID-19 virus 09/29/2019  . Acute respiratory failure with hypoxia (HCC) 09/29/2019  . Transaminitis 09/29/2019  . AKI (acute kidney injury) (HCC) 09/29/2019  . Microcytic anemia 09/29/2019  . Chronic pain syndrome 10/15/2012  . Osteochondral defect of ankle 05/26/2012  . Ankle arthritis 05/26/2012  . Right knee pain 05/26/2012  . Medial meniscus, posterior horn derangement 05/26/2012  . Severe obesity (BMI >= 40) (HCC)   . Hypertension     History reviewed. No pertinent surgical history.     No family history on file.  Social History   Tobacco Use  . Smoking status: Never Smoker  . Smokeless tobacco: Never Used  Vaping Use  . Vaping Use: Never used   Substance Use Topics  . Alcohol use: Yes    Comment: occasional  . Drug use: No    Home Medications Prior to Admission medications   Medication Sig Start Date End Date Taking? Authorizing Provider  albuterol (VENTOLIN HFA) 108 (90 Base) MCG/ACT inhaler Inhale 2 puffs into the lungs every 6 (six) hours as needed for wheezing or shortness of breath. 10/07/19   Erick BlinksMemon, Jehanzeb, MD  guaiFENesin-dextromethorphan (ROBITUSSIN DM) 100-10 MG/5ML syrup Take 10 mLs by mouth every 4 (four) hours as needed for cough. 10/07/19   Erick BlinksMemon, Jehanzeb, MD    Allergies    Patient has no known allergies.  Review of Systems   Review of Systems  Constitutional: Positive for chills, fatigue and fever.  HENT: Negative for congestion and sore throat.   Eyes: Negative.   Respiratory: Positive for cough and shortness of breath. Negative for chest tightness.   Cardiovascular: Positive for palpitations. Negative for chest pain and leg swelling.  Gastrointestinal: Negative for abdominal pain, nausea and vomiting.  Genitourinary: Negative.   Musculoskeletal: Negative for arthralgias, joint swelling and neck pain.  Skin: Negative.  Negative for rash and wound.  Neurological: Positive for weakness. Negative for dizziness, light-headedness, numbness and headaches.  Psychiatric/Behavioral: Negative.     Physical Exam Updated Vital Signs BP (!) 114/56   Pulse (!) 133   Temp (!) 101.6 F (38.7 C) (Oral)   Resp (!) 29   Ht 5\' 8"  (1.727 m)  Wt (!) 158.8 kg   SpO2 94%   BMI 53.22 kg/m   Physical Exam Vitals and nursing note reviewed.  Constitutional:      General: He is in acute distress.     Appearance: He is well-developed. He is obese. He is ill-appearing.  HENT:     Head: Normocephalic and atraumatic.  Eyes:     Conjunctiva/sclera: Conjunctivae normal.  Cardiovascular:     Rate and Rhythm: Regular rhythm. Tachycardia present.     Heart sounds: Normal heart sounds.  Pulmonary:     Effort: Pulmonary  effort is normal. Tachypnea present.     Breath sounds: Examination of the right-lower field reveals rales. Examination of the left-lower field reveals rales. Decreased breath sounds and rales present. No wheezing.  Abdominal:     General: Bowel sounds are normal.     Palpations: Abdomen is soft.     Tenderness: There is no abdominal tenderness.  Musculoskeletal:        General: Normal range of motion.     Cervical back: Normal range of motion.     Right lower leg: No tenderness. No edema.     Left lower leg: No tenderness. No edema.  Skin:    General: Skin is warm and dry.  Neurological:     Mental Status: He is alert.     ED Results / Procedures / Treatments   Labs (all labs ordered are listed, but only abnormal results are displayed) Labs Reviewed  CBC WITH DIFFERENTIAL/PLATELET - Abnormal; Notable for the following components:      Result Value   WBC 11.4 (*)    Hemoglobin 9.3 (*)    HCT 31.1 (*)    MCV 68.5 (*)    MCH 20.5 (*)    MCHC 29.9 (*)    RDW 16.9 (*)    Neutro Abs 9.1 (*)    Monocytes Absolute 1.1 (*)    All other components within normal limits  COMPREHENSIVE METABOLIC PANEL - Abnormal; Notable for the following components:   Chloride 96 (*)    Glucose, Bld 156 (*)    Calcium 8.8 (*)    Albumin 2.8 (*)    All other components within normal limits  D-DIMER, QUANTITATIVE (NOT AT [facility]) - Abnormal; Notable for the following components:   D-Dimer, Quant 1.59 (*)    All other components within normal limits  FIBRINOGEN - Abnormal; Notable for the following components:   Fibrinogen >800 (*)    All other components within normal limits  C-REACTIVE PROTEIN - Abnormal; Notable for the following components:   CRP 22.3 (*)    All other components within normal limits  CULTURE, BLOOD (ROUTINE X 2)  CULTURE, BLOOD (ROUTINE X 2)  LACTIC ACID, PLASMA  PROCALCITONIN  LACTATE DEHYDROGENASE  FERRITIN  TRIGLYCERIDES    EKG EKG  Interpretation  Date/Time:  Tuesday October 19 2019 16:58:14 EDT Ventricular Rate:  134 PR Interval:    QRS Duration: 92 QT Interval:  290 QTC Calculation: 433 R Axis:   107 Text Interpretation: Sinus tachycardia Ventricular premature complex Probable left atrial enlargement Right axis deviation Low voltage, precordial leads Consider anterior infarct Confirmed by Vanetta Mulders [phone]) on 10/19/2019 5:01:24 PM   Radiology DG Chest 2 View  Result Date: 10/19/2019 CLINICAL DATA:  Persistent cough post discharge with COVID. EXAM: CHEST - 2 VIEW COMPARISON:  09/29/2019 FINDINGS: Extensive bilateral airspace opacities. No visible pleural effusions or pneumothorax. Cardiac silhouette is enlarged and accentuated by low lung volumes.  No evidence of acute osseous abnormality. IMPRESSION: 1. Extensive bilateral airspace opacities, compatible with multifocal pneumonia. 2. Suspected cardiomegaly. Electronically Signed   By: Feliberto Harts MD   On: 10/19/2019 16:24   CT Angio Chest PE W and/or Wo Contrast  Result Date: 10/19/2019 CLINICAL DATA:  Cough and shortness of breath. COVID-19 virus infection. EXAM: CT ANGIOGRAPHY CHEST WITH CONTRAST TECHNIQUE: Multidetector CT imaging of the chest was performed using the standard protocol during bolus administration of intravenous contrast. Multiplanar CT image reconstructions and MIPs were obtained to evaluate the vascular anatomy. Study was performed twice due to suboptimal opacification of pulmonary arteries. CONTRAST:  OMNIPAQUE IOHEXOL 350 MG/ML SOLN COMPARISON:  10/03/2019 FINDINGS: Cardiovascular: Suboptimal contrast opacification of the pulmonary arteries is demonstrated on both scans, likely due to contrast injection triggering being compromised by patient breathing during the exam, as well as pulmonary airspace disease. No large central pulmonary emboli are identified, however pulmonary emboli in sub lobar pulmonary arteries cannot be excluded on  this exam. Mediastinum/Nodes: No masses or pathologically enlarged lymph nodes identified. Lungs/Pleura: Heterogeneous airspace opacity is again seen throughout both lungs, without significant change since prior exam. No evidence of parenchymal cavitation or pleural effusion. Upper abdomen: No acute findings. Musculoskeletal: No suspicious bone lesions identified. Review of the MIP images confirms the above findings. IMPRESSION: Suboptimal contrast opacification of pulmonary arteries. No large central pulmonary emboli identified, however pulmonary emboli in sub-lobar pulmonary arteries cannot be excluded. No significant change in severe bilateral pulmonary airspace opacity, consistent with viral pneumonia. Electronically Signed   By: Danae Orleans M.D.   On: 10/19/2019 19:45   DG Chest Port 1 View  Result Date: 10/19/2019 CLINICAL DATA:  Shortness of breath. COVID positive. EXAM: PORTABLE CHEST 1 VIEW COMPARISON:  October 19, 2019 (3:56 p.m.) FINDINGS: Persistent moderate to marked severity bilateral infiltrates are seen. Very mildly improved aeration of both lungs is noted. There is no evidence of a pleural effusion or pneumothorax. The cardiac silhouette is mildly enlarged and unchanged in size. The visualized skeletal structures are unremarkable. IMPRESSION: Persistent moderate to marked severity bilateral infiltrates. Electronically Signed   By: Aram Candela M.D.   On: 10/19/2019 18:37    Procedures Procedures (including critical care time)  Medications Ordered in ED Medications  methylPREDNISolone sodium succinate (SOLU-MEDROL) 125 mg/2 mL injection 125 mg (has no administration in time range)  enoxaparin (LOVENOX) 100 mg/mL injection 160 mg (has no administration in time range)  acetaminophen (TYLENOL) tablet 1,000 mg (1,000 mg Oral Given 10/19/19 1827)  iohexol (OMNIPAQUE) 350 MG/ML injection 100 mL (180 mLs Intravenous Contrast Given 10/19/19 1924)    ED Course  I have reviewed the  triage vital signs and the nursing notes.  Pertinent labs & imaging results that were available during my care of the patient were reviewed by me and considered in my medical decision making (see chart for details).    MDM Rules/Calculators/A&P                          Pt with known Covid 19, admission/dc on 9/16, home on oxygen 2L, now with new increased oxygen requirement to 4L with worsening appearing multifocal pneumonia.  CT suboptimal but negative for large central PE.  Pt with acute worsening respiratory failure.  No wheezing on exam.  Tachycardia, tachypnea, O2 sats drops less than 92% unless on 4L.    Discussed with Dr. Thomes Dinning who accepts pt for admission.  CT negative for  large PE, but suboptimal study, still concern for this possibility.  He has requested Lovenex, also solumedrol, both ordered.     Final Clinical Impression(s) / ED Diagnoses Final diagnoses:  Pneumonia due to COVID-19 virus    Rx / DC Orders ED Discharge Orders    None       Victoriano Lain 10/19/19 2128    Vanetta Mulders, MD 10/29/19 1557

## 2019-10-19 NOTE — ED Triage Notes (Signed)
Pt covid positive on 9/8. Pt sent from Urgent Care for SOB and persistent cough.

## 2019-10-20 ENCOUNTER — Inpatient Hospital Stay (HOSPITAL_COMMUNITY): Payer: 59

## 2019-10-20 DIAGNOSIS — I10 Essential (primary) hypertension: Secondary | ICD-10-CM

## 2019-10-20 DIAGNOSIS — J1282 Pneumonia due to coronavirus disease 2019: Secondary | ICD-10-CM

## 2019-10-20 DIAGNOSIS — I2699 Other pulmonary embolism without acute cor pulmonale: Principal | ICD-10-CM

## 2019-10-20 DIAGNOSIS — I272 Pulmonary hypertension, unspecified: Secondary | ICD-10-CM

## 2019-10-20 DIAGNOSIS — E8809 Other disorders of plasma-protein metabolism, not elsewhere classified: Secondary | ICD-10-CM

## 2019-10-20 DIAGNOSIS — U071 COVID-19: Secondary | ICD-10-CM

## 2019-10-20 LAB — CBC WITH DIFFERENTIAL/PLATELET
Abs Immature Granulocytes: 0.03 10*3/uL (ref 0.00–0.07)
Basophils Absolute: 0 10*3/uL (ref 0.0–0.1)
Basophils Relative: 0 %
Eosinophils Absolute: 0 10*3/uL (ref 0.0–0.5)
Eosinophils Relative: 0 %
HCT: 30.7 % — ABNORMAL LOW (ref 39.0–52.0)
Hemoglobin: 9.1 g/dL — ABNORMAL LOW (ref 13.0–17.0)
Immature Granulocytes: 0 %
Lymphocytes Relative: 7 %
Lymphs Abs: 0.7 10*3/uL (ref 0.7–4.0)
MCH: 20.3 pg — ABNORMAL LOW (ref 26.0–34.0)
MCHC: 29.6 g/dL — ABNORMAL LOW (ref 30.0–36.0)
MCV: 68.5 fL — ABNORMAL LOW (ref 80.0–100.0)
Monocytes Absolute: 0.2 10*3/uL (ref 0.1–1.0)
Monocytes Relative: 2 %
Neutro Abs: 8.9 10*3/uL — ABNORMAL HIGH (ref 1.7–7.7)
Neutrophils Relative %: 91 %
Platelets: 167 10*3/uL (ref 150–400)
RBC: 4.48 MIL/uL (ref 4.22–5.81)
RDW: 16.6 % — ABNORMAL HIGH (ref 11.5–15.5)
WBC: 9.8 10*3/uL (ref 4.0–10.5)
nRBC: 0 % (ref 0.0–0.2)

## 2019-10-20 LAB — COMPREHENSIVE METABOLIC PANEL
ALT: 29 U/L (ref 0–44)
AST: 16 U/L (ref 15–41)
Albumin: 2.7 g/dL — ABNORMAL LOW (ref 3.5–5.0)
Alkaline Phosphatase: 74 U/L (ref 38–126)
Anion gap: 10 (ref 5–15)
BUN: 11 mg/dL (ref 6–20)
CO2: 29 mmol/L (ref 22–32)
Calcium: 9.1 mg/dL (ref 8.9–10.3)
Chloride: 96 mmol/L — ABNORMAL LOW (ref 98–111)
Creatinine, Ser: 0.82 mg/dL (ref 0.61–1.24)
GFR calc Af Amer: >60 mL/min (ref >60)
GFR calc non Af Amer: >60 mL/min (ref >60)
Glucose, Bld: 213 mg/dL — ABNORMAL HIGH (ref 70–99)
Potassium: 4.6 mmol/L (ref 3.5–5.1)
Sodium: 135 mmol/L (ref 135–145)
Total Bilirubin: 0.7 mg/dL (ref 0.3–1.2)
Total Protein: 7.6 g/dL (ref 6.5–8.1)

## 2019-10-20 LAB — IRON AND TIBC
Iron: 22 ug/dL — ABNORMAL LOW (ref 45–182)
Saturation Ratios: 7 % — ABNORMAL LOW (ref 17.9–39.5)
TIBC: 305 ug/dL (ref 250–450)
UIBC: 283 ug/dL

## 2019-10-20 LAB — ECHOCARDIOGRAM COMPLETE
Area-P 1/2: 4.49 cm2
Height: 68 in
S' Lateral: 3.86 cm
Weight: 5600 oz

## 2019-10-20 LAB — CBG MONITORING, ED
Glucose-Capillary: 158 mg/dL — ABNORMAL HIGH (ref 70–99)
Glucose-Capillary: 161 mg/dL — ABNORMAL HIGH (ref 70–99)
Glucose-Capillary: 181 mg/dL — ABNORMAL HIGH (ref 70–99)
Glucose-Capillary: 181 mg/dL — ABNORMAL HIGH (ref 70–99)
Glucose-Capillary: 194 mg/dL — ABNORMAL HIGH (ref 70–99)

## 2019-10-20 LAB — MAGNESIUM: Magnesium: 2.1 mg/dL (ref 1.7–2.4)

## 2019-10-20 LAB — D-DIMER, QUANTITATIVE: D-Dimer, Quant: 1.26 ug/mL-FEU — ABNORMAL HIGH (ref 0.00–0.50)

## 2019-10-20 LAB — PHOSPHORUS: Phosphorus: 4.4 mg/dL (ref 2.5–4.6)

## 2019-10-20 LAB — HEMOGLOBIN A1C
Hgb A1c MFr Bld: 7.1 % — ABNORMAL HIGH (ref 4.8–5.6)
Mean Plasma Glucose: 157.07 mg/dL

## 2019-10-20 LAB — C-REACTIVE PROTEIN: CRP: 25 mg/dL — ABNORMAL HIGH (ref <1.0)

## 2019-10-20 LAB — FERRITIN: Ferritin: 289 ng/mL (ref 24–336)

## 2019-10-20 MED ORDER — ALBUTEROL SULFATE HFA 108 (90 BASE) MCG/ACT IN AERS: 2.0000 | INHALATION_SPRAY | Freq: Four times a day (QID) | RESPIRATORY_TRACT | Status: DC | PRN

## 2019-10-20 MED ORDER — GUAIFENESIN-DM 100-10 MG/5ML PO SYRP: 10.0000 mL | ORAL_SOLUTION | ORAL | Status: DC | PRN

## 2019-10-20 MED ORDER — PANTOPRAZOLE SODIUM 40 MG PO TBEC
40.0000 mg | DELAYED_RELEASE_TABLET | Freq: Two times a day (BID) | ORAL | Status: DC
  Administered 2019-10-20 – 2019-10-22 (×4): 40 mg via ORAL
  Filled 2019-10-20 (×4): qty 1

## 2019-10-20 MED ORDER — POLYETHYLENE GLYCOL 3350 17 G PO PACK
17.0000 g | PACK | Freq: Every day | ORAL | Status: DC
  Administered 2019-10-21 – 2019-10-22 (×2): 17 g via ORAL
  Filled 2019-10-20 (×2): qty 1

## 2019-10-20 MED ORDER — SENNOSIDES-DOCUSATE SODIUM 8.6-50 MG PO TABS
2.0000 | ORAL_TABLET | Freq: Every day | ORAL | Status: DC
  Filled 2019-10-20 (×3): qty 2

## 2019-10-20 MED ORDER — METHYLPREDNISOLONE SODIUM SUCC 125 MG IJ SOLR
60.0000 mg | Freq: Two times a day (BID) | INTRAMUSCULAR | Status: DC
  Administered 2019-10-20 – 2019-10-22 (×4): 60 mg via INTRAVENOUS
  Filled 2019-10-20 (×4): qty 2

## 2019-10-20 MED ORDER — HYDROCORTISONE ACETATE 25 MG RE SUPP
25.0000 mg | Freq: Two times a day (BID) | RECTAL | Status: DC
  Administered 2019-10-21 – 2019-10-22 (×2): 25 mg via RECTAL
  Filled 2019-10-20 (×3): qty 1

## 2019-10-20 MED ORDER — ENOXAPARIN SODIUM 80 MG/0.8ML ~~LOC~~ SOLN
80.0000 mg | SUBCUTANEOUS | Status: DC
  Administered 2019-10-20 – 2019-10-22 (×3): 80 mg via SUBCUTANEOUS
  Filled 2019-10-20 (×3): qty 0.8

## 2019-10-20 NOTE — ED Notes (Addendum)
I assumed care of this patient at this time. At the time of assuming care, patient is sitting in bed with continuous cardiac monitoring in place. Pt on 6LHFNC with SpO2 sustaining 90-93%. Bed is locked in the lowest position, side rails x2 and call bell within reach.  Pt requesting to have one side rail down for use of bedside commode. Bed sie commode positioned close to bed side at request,  tolieting assistance if offered but refused at this time. Pt educated to call for staff if assistance is need, pt verbalized understanding and is in agreement at this time. Pt educated on hourly rounding and verbalized understanding at this time.  Will continue observation at this time.

## 2019-10-20 NOTE — ED Notes (Signed)
Dr. Mariea Clonts at bedside at this time.

## 2019-10-20 NOTE — ED Notes (Signed)
Dr. Mariea Clonts made aware of patient's moderately bloody bowel movement. Aware that patient denies a history of blood in stool. Awaiting a page at this time.

## 2019-10-20 NOTE — ED Notes (Signed)
This RN rounded on patient, patient changed into a clean sheet, chuck pad and gown at this time. Pt repositioned in bed, bed is locked in the lowest position, side rails x1 and call bell within reach. All questions and concerns voiced addressed at this time. Will continue observation at this time.

## 2019-10-20 NOTE — ED Notes (Addendum)
Pt provided with personal bag brought by family and lunch tray at this time. At pt request, bed rail raised x1, bed is locked in the lowest position. Continuous cardiac monitoring in place at this time.

## 2019-10-20 NOTE — ED Notes (Signed)
Thomes Dinning, MD notified re: pts O2 being increased to 6L d/t 89-90% O2 sats while laying in bed, will continue to monitor

## 2019-10-20 NOTE — Progress Notes (Signed)
Patient Demographics:    Ricardo Lopez, is a 44 y.o. male, DOB - 1976-01-07, OYD:[id number]  Admit date - 10/19/2019   Admitting Physician Frankey Shown, DO  Outpatient Primary MD for the patient is Pickard, Priscille Heidelberg, MD  LOS - 1   Chief Complaint  Patient presents with  . Shortness of Breath        Subjective:    Ricardo Lopez today has no fevers, no emesis,  No chest pain,   -Cough and shortness of breath persist -Complains of constipation, had rather hard BM with some blood otherwise no GI bleeding  Assessment  & Plan :    Principal Problem:   Acute pulmonary embolism (HCC) Active Problems:   Severe obesity (BMI >= 40) (HCC)   Hypertension   Pneumonia due to COVID-19 virus   Microcytic anemia   Hyperglycemia   Hypoalbuminemia  Brief Summary:-  44 y.o. male with medical history significant for hypertension and obesity readmitted on 10/19/2027 with worsening hypoxia and chest imaging findings suggestive of worsening multifocal pneumonia in the setting of recent diagnosis of Covid infection -Patient is Not vaccinated against COVID-19  A/p 1) acute hypoxic respiratory failure secondary to COVID-19 infection----previously completed remdesivir -Currently requiring 8 L of oxygen via nasal cannula -Did not complete baricitinib in the past due to LFTs being elevated -Treat empirically with supplemental oxygen, baricitinib and steroids -Worsening hypoxic respiratory failure, echo with preserved EF of 55 to 60% without regional wall motion normalities -CTA chest with poor opacification of contrast, but no definite PE -Lower extremity venous Dopplers pending -Enhanced DVT prophylaxis given COVID-19 infection and worsening hypoxia -If concerns about rectal bleeding may have to discontinue Lovenox COVID-19 Labs  Recent Labs    10/19/19 1713 10/20/19 0615  DDIMER 1.59* 1.26*  FERRITIN 233 289   LDH 174  --   CRP 22.3* 25.0*    Lab Results  Component Value Date   SARSCOV2NAA POSITIVE (A) 09/29/2019     2)DM2-A1c 7.1 reflecting uncontrolled DM suspect glycemic control is worsened due to recent steroid therapy for COVID-19 infection -Use Novolog/Humalog Sliding scale insulin with Accu-Cheks/Fingersticks as ordered  3) iron deficiency anemia--- serum iron is 22 with saturation of 7, ferritin is probably not low due to COVID-19 infection =--Patient will need endoluminal evaluation as outpatient after resolution of his acute respiratory symptoms -Hgb  is 9.1 down from 9.3 on admission on 10/11/2019, hemoglobin 2 weeks ago was above 11 -May have to hold anticoagulation if further rectal bleeding concerns -PPI as ordered  4)Morbid Obesity- -after resolution of acute respiratory symptoms patient will be advised to comply with low calorie diet, portion control and increase physical activity discussed with patient -Body mass index is 53.22 kg/m.    Disposition/Need for in-Hospital Stay- patient unable to be discharged at this time due to --worsening hypoxic respiratory failure in the setting of COVID-19 infection requiring IV steroids, drop in H&H requiring further monitoring,   Status is: Inpatient  Remains inpatient appropriate because:worsening hypoxic respiratory failure in the setting of COVID-19 infection, drop in H&H requiring further monitoring   Disposition: The patient is from: Home              Anticipated d/c is to: Home  Anticipated d/c date is: 2 days              Patient currently is not medically stable to d/c. Barriers: Not Clinically Stable- worsening hypoxic respiratory failure in the setting of COVID-19 infection requiring IV steroids, drop in H&H requiring further monitoring,    Code Status : full   Family Communication:    (patient is alert, awake and coherent)   Consults  :  na  DVT Prophylaxis  :  Lovenox -   - SCDs    Lab Results   Component Value Date   PLT 167 10/20/2019    Inpatient Medications  Scheduled Meds: . albuterol  2 puff Inhalation Q6H  . vitamin C  500 mg Oral Daily  . baricitinib  4 mg Oral Daily  . dextromethorphan-guaiFENesin  1 tablet Oral BID  . enoxaparin (LOVENOX) injection  80 mg Subcutaneous Q24H  . feeding supplement (GLUCERNA SHAKE)  237 mL Oral TID BM  . hydrocortisone  25 mg Rectal BID  . insulin aspart  0-5 Units Subcutaneous QHS  . insulin aspart  0-9 Units Subcutaneous TID WC  . methylPREDNISolone (SOLU-MEDROL) injection  60 mg Intravenous Q12H  . zinc sulfate  220 mg Oral Daily   Continuous Infusions: PRN Meds:.albuterol, chlorpheniramine-HYDROcodone, guaiFENesin-dextromethorphan    Anti-infectives (From admission, onward)   None        Objective:   Vitals:   10/20/19 1200 10/20/19 1358 10/20/19 1500 10/20/19 1730  BP: (!) 142/89  (!) 151/91 (!) 166/106  Pulse: (!) 109  (!) 116 (!) 115  Resp: (!) 26  (!) 28 (!) 24  Temp: 98.4 F (36.9 C)     TempSrc: Oral     SpO2: 93% 94% 94% 91%  Weight:      Height:        Wt Readings from Last 3 Encounters:  10/19/19 (!) 158.8 kg  09/29/19 (!) 176.1 kg  09/29/19 (!) 180.5 kg     Intake/Output Summary (Last 24 hours) at 10/20/2019 1842 Last data filed at 10/20/2019 0919 Gross per 24 hour  Intake --  Output 1650 ml  Net -1650 ml     Physical Exam  Gen:- Awake Alert, morbidly obese , HEENT:- [place].AT, No sclera icterus Nose- 8 L/min Neck-Supple Neck,No JVD,.  Lungs-diminished breath sounds, scattered rhonchi  CV- S1, S2 normal, regular  Abd-  +ve B.Sounds, Abd Soft, No tenderness, increased truncal adiposity Extremity/Skin:- No  edema, pedal pulses present  Psych-affect is appropriate, oriented x3 Neuro-generalized weakness, no new focal deficits, no tremors   Data Review:   Micro Results Recent Results (from the past 240 hour(s))  Blood Culture (routine x 2)     Status: None (Preliminary result)    Collection Time: 10/19/19  5:13 PM   Specimen: BLOOD RIGHT FOREARM  Result Value Ref Range Status   Specimen Description BLOOD RIGHT FOREARM  Final   Special Requests   Final    BOTTLES DRAWN AEROBIC AND ANAEROBIC Blood Culture adequate volume   Culture   Final    NO GROWTH < 24 HOURS Performed at [facility], [address], [place], [place] [address]    Report Status PENDING  Incomplete  Blood Culture (routine x 2)     Status: None (Preliminary result)   Collection Time: 10/19/19  5:38 PM   Specimen: BLOOD  Result Value Ref Range Status   Specimen Description BLOOD LEFT ANTECUBITAL  Final   Special Requests   Final    BOTTLES  DRAWN AEROBIC AND ANAEROBIC Blood Culture adequate volume   Culture   Final    NO GROWTH < 24 HOURS Performed at [facility], [address], [place], [place] [address]    Report Status PENDING  Incomplete    Radiology Reports DG Chest 2 View  Result Date: 10/19/2019 CLINICAL DATA:  Persistent cough post discharge with COVID. EXAM: CHEST - 2 VIEW COMPARISON:  09/29/2019 FINDINGS: Extensive bilateral airspace opacities. No visible pleural effusions or pneumothorax. Cardiac silhouette is enlarged and accentuated by low lung volumes. No evidence of acute osseous abnormality. IMPRESSION: 1. Extensive bilateral airspace opacities, compatible with multifocal pneumonia. 2. Suspected cardiomegaly. Electronically Signed   By: Feliberto Harts MD   On: 10/19/2019 16:24   CT Angio Chest PE W and/or Wo Contrast  Result Date: 10/19/2019 CLINICAL DATA:  Cough and shortness of breath. COVID-19 virus infection. EXAM: CT ANGIOGRAPHY CHEST WITH CONTRAST TECHNIQUE: Multidetector CT imaging of the chest was performed using the standard protocol during bolus administration of intravenous contrast. Multiplanar CT image reconstructions and MIPs were obtained to evaluate the vascular anatomy. Study was performed twice due to suboptimal opacification of pulmonary arteries. CONTRAST:   OMNIPAQUE IOHEXOL 350 MG/ML SOLN COMPARISON:  10/03/2019 FINDINGS: Cardiovascular: Suboptimal contrast opacification of the pulmonary arteries is demonstrated on both scans, likely due to contrast injection triggering being compromised by patient breathing during the exam, as well as pulmonary airspace disease. No large central pulmonary emboli are identified, however pulmonary emboli in sub lobar pulmonary arteries cannot be excluded on this exam. Mediastinum/Nodes: No masses or pathologically enlarged lymph nodes identified. Lungs/Pleura: Heterogeneous airspace opacity is again seen throughout both lungs, without significant change since prior exam. No evidence of parenchymal cavitation or pleural effusion. Upper abdomen: No acute findings. Musculoskeletal: No suspicious bone lesions identified. Review of the MIP images confirms the above findings. IMPRESSION: Suboptimal contrast opacification of pulmonary arteries. No large central pulmonary emboli identified, however pulmonary emboli in sub-lobar pulmonary arteries cannot be excluded. No significant change in severe bilateral pulmonary airspace opacity, consistent with viral pneumonia. Electronically Signed   By: Danae Orleans M.D.   On: 10/19/2019 19:45   CT ANGIO CHEST PE W OR WO CONTRAST  Result Date: 10/03/2019 CLINICAL DATA:  Shortness of breath.  COVID positive. EXAM: CT ANGIOGRAPHY CHEST WITH CONTRAST TECHNIQUE: Multidetector CT imaging of the chest was performed using the standard protocol during bolus administration of intravenous contrast. Multiplanar CT image reconstructions and MIPs were obtained to evaluate the vascular anatomy. CONTRAST:  OMNIPAQUE IOHEXOL 350 MG/ML SOLN COMPARISON:  None. FINDINGS: Cardiovascular: Evaluation for pulmonary emboli is limited by respiratory motion artifact and patient body habitus. There is no pulmonary embolus or evidence of right heart strain. The size of the main pulmonary artery is normal. Heart  size is normal, with no pericardial effusion. The course and caliber of the aorta are normal. There is no atherosclerotic calcification. Opacification decreased due to pulmonary arterial phase contrast bolus timing. Mediastinum/Nodes: -- No mediastinal lymphadenopathy. -- No hilar lymphadenopathy. -- No axillary lymphadenopathy. -- No supraclavicular lymphadenopathy. -- Normal thyroid gland where visualized. -  Unremarkable esophagus. Lungs/Pleura: There are diffuse bilateral ground-glass airspace opacities without evidence for pneumothorax or significant pleural effusion. The trachea is unremarkable. Upper Abdomen: Contrast bolus timing is not optimized for evaluation of the abdominal organs. There is probable hepatic steatosis. Musculoskeletal: No chest wall abnormality. No bony spinal canal stenosis. Review of the MIP images confirms the above findings. IMPRESSION: 1. Evaluation for pulmonary  emboli is limited by respiratory motion artifact and patient body habitus. Given this limitation, there is no evidence for acute pulmonary embolus. 2. Diffuse bilateral ground-glass airspace opacities, consistent with the patient's history of viral pneumonia. 3. Probable hepatic steatosis. Electronically Signed   By: Katherine Mantle M.D.   On: 10/03/2019 17:42   DG Chest Port 1 View  Result Date: 10/19/2019 CLINICAL DATA:  Shortness of breath. COVID positive. EXAM: PORTABLE CHEST 1 VIEW COMPARISON:  October 19, 2019 (3:56 p.m.) FINDINGS: Persistent moderate to marked severity bilateral infiltrates are seen. Very mildly improved aeration of both lungs is noted. There is no evidence of a pleural effusion or pneumothorax. The cardiac silhouette is mildly enlarged and unchanged in size. The visualized skeletal structures are unremarkable. IMPRESSION: Persistent moderate to marked severity bilateral infiltrates. Electronically Signed   By: Aram Candela M.D.   On: 10/19/2019 18:37   DG Chest Port 1 View  Result  Date: 09/29/2019 CLINICAL DATA:  Cough and shortness of breath.  Diffuse pain. EXAM: PORTABLE CHEST 1 VIEW COMPARISON:  None. FINDINGS: Borderline cardiomegaly. Heterogeneous bilateral lung opacities. No pneumothorax or large pleural effusion. Degenerative change of both acromioclavicular joints. No evidence of acute osseous abnormality. IMPRESSION: Heterogeneous bilateral lung opacities, suspicious for multifocal pneumonia, including COVID-19. Electronically Signed   By: Narda Rutherford M.D.   On: 09/29/2019 15:05   ECHOCARDIOGRAM COMPLETE  Result Date: 10/20/2019    ECHOCARDIOGRAM REPORT   Patient Name:   Ricardo Lopez. Date of Exam: 10/20/2019 Medical Rec #:  [id number]          Height:       68.0 in Accession #:    [id number]         Weight:       350.0 lb Date of Birth:  03-20-1975         BSA:          2.592 m Patient Age:    43 years           BP:           122/75 mmHg Patient Gender: M                  HR:           104 bpm. [facility] Procedure: 2D Echo, Cardiac Doppler and Color Doppler Indications:    Pulmonary hypertension 416.8 / I27.2  History:        Patient has no prior history of Echocardiogram examinations.                 Risk Factors:Hypertension. Admit with Pneumonia due to COVID-19                 virus, Severe obesity, Acute pulmonary embolism.  Sonographer:    Celesta Gentile RCS Referring Phys: [id number] OLADAPO ADEFESO IMPRESSIONS  1. Left ventricular ejection fraction, by estimation, is 55 to 60%. The left ventricle has normal function. The left ventricle has no regional wall motion abnormalities. There is mild left ventricular hypertrophy. Left ventricular diastolic parameters are indeterminate.  2. Right ventricular systolic function is mildly reduced. The right ventricular size is normal. Tricuspid regurgitation signal is inadequate for assessing PA pressure.  3. Left atrial size was moderately dilated.  4. The mitral valve is grossly normal. Trivial mitral valve  regurgitation.  5. The aortic valve is tricuspid. Aortic valve regurgitation is not visualized.  6. The inferior vena cava is dilated in size  with <50% respiratory variability, suggesting right atrial pressure of 15 mmHg. FINDINGS  Left Ventricle: Left ventricular ejection fraction, by estimation, is 55 to 60%. The left ventricle has normal function. The left ventricle has no regional wall motion abnormalities. The left ventricular internal cavity size was normal in size. There is  mild left ventricular hypertrophy. Left ventricular diastolic parameters are indeterminate. Right Ventricle: The right ventricular size is normal. No increase in right ventricular wall thickness. Right ventricular systolic function is mildly reduced. Tricuspid regurgitation signal is inadequate for assessing PA pressure. Left Atrium: Left atrial size was moderately dilated. Right Atrium: Right atrial size was normal in size. Pericardium: There is no evidence of pericardial effusion. Mitral Valve: The mitral valve is grossly normal. Trivial mitral valve regurgitation. Tricuspid Valve: The tricuspid valve is grossly normal. Tricuspid valve regurgitation is trivial. Aortic Valve: The aortic valve is tricuspid. Aortic valve regurgitation is not visualized. Pulmonic Valve: The pulmonic valve was grossly normal. Pulmonic valve regurgitation is trivial. Aorta: The aortic root is normal in size and structure. Venous: The inferior vena cava is dilated in size with less than 50% respiratory variability, suggesting right atrial pressure of 15 mmHg. IAS/Shunts: No atrial level shunt detected by color flow Doppler.  LEFT VENTRICLE PLAX 2D LVIDd:         5.54 cm LVIDs:         3.86 cm LV PW:         1.27 cm LV IVS:        1.37 cm LVOT diam:     2.50 cm LV SV:         126 LV SV Index:   49 LVOT Area:     4.91 cm  RIGHT VENTRICLE TAPSE (M-mode): 3.6 cm LEFT ATRIUM              Index       RIGHT ATRIUM           Index LA diam:        4.80 cm  1.85 cm/m   RA Area:     20.30 cm LA Vol (A2C):   99.8 ml  38.50 ml/m RA Volume:   63.50 ml  24.49 ml/m LA Vol (A4C):   116.0 ml 44.74 ml/m LA Biplane Vol: 108.0 ml 41.66 ml/m  AORTIC VALVE LVOT Vmax:   126.00 cm/s LVOT Vmean:  76.600 cm/s LVOT VTI:    0.257 m  AORTA Ao Root diam: 3.70 cm MITRAL VALVE MV Area (PHT): 4.49 cm     SHUNTS MV Decel Time: 169 msec     Systemic VTI:  0.26 m MV E velocity: 141.00 cm/s  Systemic Diam: 2.50 cm Nona DellSamuel Mcdowell MD Electronically signed by Nona DellSamuel Mcdowell MD Signature Date/Time: 10/20/2019/3:49:54 PM    Final      CBC Recent Labs  Lab 10/19/19 1713 10/20/19 0615  WBC 11.4* 9.8  HGB 9.3* 9.1*  HCT 31.1* 30.7*  PLT 171 167  MCV 68.5* 68.5*  MCH 20.5* 20.3*  MCHC 29.9* 29.6*  RDW 16.9* 16.6*  LYMPHSABS 1.1 0.7  MONOABS 1.1* 0.2  EOSABS 0.1 0.0  BASOSABS 0.0 0.0    Chemistries  Recent Labs  Lab 10/19/19 1713 10/20/19 0615  NA 136 135  K 4.2 4.6  CL 96* 96*  CO2 28 29  GLUCOSE 156* 213*  BUN 9 11  CREATININE 0.90 0.82  CALCIUM 8.8* 9.1  MG  --  2.1  AST 19 16  ALT 29 29  ALKPHOS  71 74  BILITOT 0.7 0.7   ------------------------------------------------------------------------------------------------------------------ Recent Labs    10/19/19 1713  TRIG 59    Lab Results  Component Value Date   HGBA1C 7.1 (H) 10/19/2019   ------------------------------------------------------------------------------------------------------------------ No results for input(s): TSH, T4TOTAL, T3FREE, THYROIDAB in the last 72 hours.  Invalid input(s): FREET3 ------------------------------------------------------------------------------------------------------------------ Recent Labs    10/19/19 1713 10/20/19 0615  FERRITIN 233 289  TIBC  --  305  IRON  --  22*    Coagulation profile No results for input(s): INR, PROTIME in the last 168 hours.  Recent Labs    10/19/19 1713 10/20/19 0615  DDIMER 1.59* 1.26*    Cardiac Enzymes No results for  input(s): CKMB, TROPONINI, MYOGLOBIN in the last 168 hours.  Invalid input(s): CK ------------------------------------------------------------------------------------------------------------------    Component Value Date/Time   BNP 26.0 09/30/2019 0755     Shon Hale M.D on 10/20/2019 at 6:42 PM  Go to [email or web address] - for contact info  [facility] - Office  [phone]

## 2019-10-20 NOTE — Progress Notes (Signed)
*  PRELIMINARY RESULTS* Echocardiogram 2D Echocardiogram has been performed.  Stacey Drain 10/20/2019, 3:22 PM

## 2019-10-20 NOTE — ED Notes (Signed)
Pt HR 140s and O2 sats 85% on 4L [place] while standing at the bedside to urinate, O2 [place] increased to 6 L while pt standing, pt monitored closely, upon sitting back in bed the pts O2 [place] decreased to 4 L [place] with sats, pt tolerated well

## 2019-10-21 ENCOUNTER — Inpatient Hospital Stay (HOSPITAL_COMMUNITY): Payer: 59

## 2019-10-21 LAB — BLOOD CULTURE ID PANEL (REFLEXED) - BCID2

## 2019-10-21 LAB — CBC WITH DIFFERENTIAL/PLATELET
Abs Immature Granulocytes: 0.1 10*3/uL — ABNORMAL HIGH (ref 0.00–0.07)
Basophils Absolute: 0 10*3/uL (ref 0.0–0.1)
Basophils Relative: 0 %
Eosinophils Absolute: 0 10*3/uL (ref 0.0–0.5)
Eosinophils Relative: 0 %
HCT: 30.1 % — ABNORMAL LOW (ref 39.0–52.0)
Hemoglobin: 9 g/dL — ABNORMAL LOW (ref 13.0–17.0)
Immature Granulocytes: 1 %
Lymphocytes Relative: 9 %
Lymphs Abs: 1.2 10*3/uL (ref 0.7–4.0)
MCH: 20.4 pg — ABNORMAL LOW (ref 26.0–34.0)
MCHC: 29.9 g/dL — ABNORMAL LOW (ref 30.0–36.0)
MCV: 68.3 fL — ABNORMAL LOW (ref 80.0–100.0)
Monocytes Absolute: 1.3 10*3/uL — ABNORMAL HIGH (ref 0.1–1.0)
Monocytes Relative: 10 %
Neutro Abs: 11.2 10*3/uL — ABNORMAL HIGH (ref 1.7–7.7)
Neutrophils Relative %: 80 %
Platelets: 201 10*3/uL (ref 150–400)
RBC: 4.41 MIL/uL (ref 4.22–5.81)
RDW: 16.7 % — ABNORMAL HIGH (ref 11.5–15.5)
WBC: 13.8 10*3/uL — ABNORMAL HIGH (ref 4.0–10.5)
nRBC: 0 % (ref 0.0–0.2)

## 2019-10-21 LAB — GLUCOSE, CAPILLARY
Glucose-Capillary: 144 mg/dL — ABNORMAL HIGH (ref 70–99)
Glucose-Capillary: 224 mg/dL — ABNORMAL HIGH (ref 70–99)
Glucose-Capillary: 163 mg/dL — ABNORMAL HIGH (ref 70–99)
Glucose-Capillary: 151 mg/dL — ABNORMAL HIGH (ref 70–99)

## 2019-10-21 LAB — COMPREHENSIVE METABOLIC PANEL
ALT: 30 U/L (ref 0–44)
AST: 18 U/L (ref 15–41)
Albumin: 2.7 g/dL — ABNORMAL LOW (ref 3.5–5.0)
Alkaline Phosphatase: 76 U/L (ref 38–126)
Anion gap: 10 (ref 5–15)
BUN: 17 mg/dL (ref 6–20)
CO2: 29 mmol/L (ref 22–32)
Calcium: 9.3 mg/dL (ref 8.9–10.3)
Chloride: 99 mmol/L (ref 98–111)
Creatinine, Ser: 0.97 mg/dL (ref 0.61–1.24)
GFR calc Af Amer: >60 mL/min (ref >60)
GFR calc non Af Amer: >60 mL/min (ref >60)
Glucose, Bld: 168 mg/dL — ABNORMAL HIGH (ref 70–99)
Potassium: 4.4 mmol/L (ref 3.5–5.1)
Sodium: 138 mmol/L (ref 135–145)
Total Bilirubin: 0.3 mg/dL (ref 0.3–1.2)
Total Protein: 7.4 g/dL (ref 6.5–8.1)

## 2019-10-21 LAB — MAGNESIUM: Magnesium: 2.3 mg/dL (ref 1.7–2.4)

## 2019-10-21 LAB — FERRITIN: Ferritin: 330 ng/mL (ref 24–336)

## 2019-10-21 LAB — PHOSPHORUS: Phosphorus: 4.7 mg/dL — ABNORMAL HIGH (ref 2.5–4.6)

## 2019-10-21 LAB — D-DIMER, QUANTITATIVE: D-Dimer, Quant: 1.24 ug/mL-FEU — ABNORMAL HIGH (ref 0.00–0.50)

## 2019-10-21 LAB — C-REACTIVE PROTEIN: CRP: 13.8 mg/dL — ABNORMAL HIGH (ref <1.0)

## 2019-10-21 MED ORDER — SODIUM CHLORIDE 0.9 % IV SOLN
1.0000 g | Freq: Three times a day (TID) | INTRAVENOUS | Status: DC
  Administered 2019-10-21: 1 g via INTRAVENOUS
  Filled 2019-10-21 (×5): qty 1

## 2019-10-21 MED ORDER — ORAL CARE MOUTH RINSE
15.0000 mL | Freq: Two times a day (BID) | OROMUCOSAL | Status: DC
  Administered 2019-10-21 (×2): 15 mL via OROMUCOSAL

## 2019-10-21 MED ORDER — SODIUM CHLORIDE 0.9 % IV SOLN
2.0000 g | Freq: Three times a day (TID) | INTRAVENOUS | Status: DC
  Administered 2019-10-21 – 2019-10-22 (×4): 2 g via INTRAVENOUS
  Filled 2019-10-21 (×5): qty 2

## 2019-10-21 NOTE — Progress Notes (Signed)
PHARMACY - PHYSICIAN COMMUNICATION CRITICAL VALUE ALERT - BLOOD CULTURE IDENTIFICATION (BCID)  Ricardo Lopez. is an 44 y.o. male who presented to [facility] on 10/19/2019 with a chief complaint of SOB  Assessment:  readmitted on 10/19/2027 with worsening hypoxia and chest imaging findings suggestive of worsening multifocal pneumonia in the setting of recent diagnosis of Covid infection. Patient now with + Blood culture identified as E.Coli. Patient is improving.   Name of physician (or Provider) Contacted: Dr. Mariea Clonts  Current antibiotics: Cefepime 1gm IV q8h  Changes to prescribed antibiotics recommended:  Recommendations accepted by provider  Increased Cefepime 2gm IV q8h  Results for orders placed or performed during the hospital encounter of 10/19/19  Blood Culture ID Panel (Reflexed) (Collected: 10/19/2019  5:13 PM)  Result Value Ref Range   Enterococcus faecalis NOT DETECTED NOT DETECTED   Enterococcus Faecium NOT DETECTED NOT DETECTED   Listeria monocytogenes NOT DETECTED NOT DETECTED   Staphylococcus species NOT DETECTED NOT DETECTED   Staphylococcus aureus (BCID) NOT DETECTED NOT DETECTED   Staphylococcus epidermidis NOT DETECTED NOT DETECTED   Staphylococcus lugdunensis NOT DETECTED NOT DETECTED   Streptococcus species NOT DETECTED NOT DETECTED   Streptococcus agalactiae NOT DETECTED NOT DETECTED   Streptococcus pneumoniae NOT DETECTED NOT DETECTED   Streptococcus pyogenes NOT DETECTED NOT DETECTED   A.calcoaceticus-baumannii NOT DETECTED NOT DETECTED   Bacteroides fragilis NOT DETECTED NOT DETECTED   Enterobacterales DETECTED (A) NOT DETECTED   Enterobacter cloacae complex NOT DETECTED NOT DETECTED   Escherichia coli DETECTED (A) NOT DETECTED   Klebsiella aerogenes NOT DETECTED NOT DETECTED   Klebsiella oxytoca NOT DETECTED NOT DETECTED   Klebsiella pneumoniae NOT DETECTED NOT DETECTED   Proteus species NOT DETECTED NOT DETECTED   Salmonella species NOT DETECTED NOT  DETECTED   Serratia marcescens NOT DETECTED NOT DETECTED   Haemophilus influenzae NOT DETECTED NOT DETECTED   Neisseria meningitidis NOT DETECTED NOT DETECTED   Pseudomonas aeruginosa NOT DETECTED NOT DETECTED   Stenotrophomonas maltophilia NOT DETECTED NOT DETECTED   Candida albicans NOT DETECTED NOT DETECTED   Candida auris NOT DETECTED NOT DETECTED   Candida glabrata NOT DETECTED NOT DETECTED   Candida krusei NOT DETECTED NOT DETECTED   Candida parapsilosis NOT DETECTED NOT DETECTED   Candida tropicalis NOT DETECTED NOT DETECTED   Cryptococcus neoformans/gattii NOT DETECTED NOT DETECTED   CTX-M ESBL NOT DETECTED NOT DETECTED   Carbapenem resistance IMP NOT DETECTED NOT DETECTED   Carbapenem resistance KPC NOT DETECTED NOT DETECTED   Carbapenem resistance NDM NOT DETECTED NOT DETECTED   Carbapenem resist OXA 48 LIKE NOT DETECTED NOT DETECTED   Carbapenem resistance VIM NOT DETECTED NOT DETECTED   Elder Cyphers, BS Loura Back, BCPS Clinical Pharmacist Pager [phone] 10/21/2019  11:15 AM

## 2019-10-21 NOTE — Progress Notes (Signed)
Patient Demographics:    Ricardo Lopez, is a 44 y.o. male, DOB - Nov 13, 1975, RAQ:[id number]  Admit date - 10/19/2019   Admitting Physician Frankey Shown, DO  Outpatient Primary MD for the patient is Pickard, Priscille Heidelberg, MD  LOS - 2   Chief Complaint  Patient presents with   Shortness of Breath        Subjective:    Ricardo Lopez today has no fevers, no emesis,  No chest pain,   -Cough and shortness of breath persist -No further bloody BM, patient refused Anusol HC -Patient refusing laxatives  Assessment  & Plan :    Principal Problem:   Acute pulmonary embolism (HCC) Active Problems:   Severe obesity (BMI >= 40) (HCC)   Hypertension   Pneumonia due to COVID-19 virus   Microcytic anemia   Hyperglycemia   Hypoalbuminemia  Brief Summary:-  45 y.o. male with medical history significant for hypertension and obesity readmitted on 10/19/2027 with worsening hypoxia and chest imaging findings suggestive of worsening multifocal pneumonia in the setting of recent diagnosis of Covid infection -Patient is Not vaccinated against COVID-19  A/p 1) acute hypoxic respiratory failure secondary to COVID-19 infection----previously completed remdesivir -Currently requiring 6 L of oxygen via nasal cannula -Did not complete baricitinib in the past due to LFTs being elevated -Treat empirically with supplemental oxygen, baricitinib and steroids -Worsening hypoxic respiratory failure, echo with preserved EF of 55 to 60% without regional wall motion normalities -CTA chest with poor opacification of contrast, but no definite PE -Lower extremity venous Dopplers without DVT -Enhanced DVT prophylaxis given COVID-19 infection and worsening hypoxia -If concerns about rectal bleeding may have to discontinue Lovenox COVID-19 Labs  Recent Labs    10/19/19 1713 10/20/19 0615 10/21/19 0655  DDIMER 1.59* 1.26* 1.24*   FERRITIN 233 289 330  LDH 174  --   --   CRP 22.3* 25.0* 13.8*    Lab Results  Component Value Date   SARSCOV2NAA POSITIVE (A) 09/29/2019    2)DM2-A1c 7.1 reflecting uncontrolled DM suspect glycemic control is worsened due to recent steroid therapy for COVID-19 infection -Use Novolog/Humalog Sliding scale insulin with Accu-Cheks/Fingersticks as ordered  3) iron deficiency anemia--- serum iron is 22 with saturation of 7, ferritin is probably not low due to COVID-19 infection =--Patient will need endoluminal evaluation as outpatient after resolution of his acute respiratory symptoms -Hgb currently stable above 9 this admission however, hemoglobin 2 weeks ago was above 11 -May have to hold anticoagulation if further rectal bleeding concerns -PPI as ordered  4)Morbid Obesity- -after resolution of acute respiratory symptoms patient will be advised to comply with low calorie diet, portion control and increase physical activity discussed with patient -Body mass index is 53.22 kg/m.    Disposition/Need for in-Hospital Stay- patient unable to be discharged at this time due to --worsening hypoxic respiratory failure in the setting of COVID-19 infection requiring IV steroids, drop in H&H requiring further monitoring,   Status is: Inpatient  Remains inpatient appropriate because:worsening hypoxic respiratory failure in the setting of COVID-19 infection, drop in H&H requiring further monitoring   Disposition: The patient is from: Home              Anticipated d/c is to: Home  Anticipated d/c date is: 2 days              Patient currently is not medically stable to d/c. Barriers: Not Clinically Stable- worsening hypoxic respiratory failure in the setting of COVID-19 infection requiring IV steroids, drop in H&H requiring further monitoring,  -Possible discharge home when O2 requirement improves to 4 L or less   Code Status : full   Family Communication:    (patient is alert,  awake and coherent)   Consults  :  na  DVT Prophylaxis  :  Lovenox -   - SCDs    Lab Results  Component Value Date   PLT 201 10/21/2019    Inpatient Medications  Scheduled Meds:  albuterol  2 puff Inhalation Q6H   vitamin C  500 mg Oral Daily   baricitinib  4 mg Oral Daily   dextromethorphan-guaiFENesin  1 tablet Oral BID   enoxaparin (LOVENOX) injection  80 mg Subcutaneous Q24H   feeding supplement (GLUCERNA SHAKE)  237 mL Oral TID BM   hydrocortisone  25 mg Rectal BID   insulin aspart  0-5 Units Subcutaneous QHS   insulin aspart  0-9 Units Subcutaneous TID WC   mouth rinse  15 mL Mouth Rinse BID   methylPREDNISolone (SOLU-MEDROL) injection  60 mg Intravenous Q12H   pantoprazole  40 mg Oral BID   polyethylene glycol  17 g Oral Daily   senna-docusate  2 tablet Oral QHS   zinc sulfate  220 mg Oral Daily   Continuous Infusions:  ceFEPime (MAXIPIME) IV 2 g (10/21/19 1444)   PRN Meds:.albuterol, chlorpheniramine-HYDROcodone, guaiFENesin-dextromethorphan    Anti-infectives (From admission, onward)   Start     Dose/Rate Route Frequency Ordered Stop   10/21/19 1400  ceFEPIme (MAXIPIME) 2 g in sodium chloride 0.9 % 100 mL IVPB        2 g 200 mL/hr over 30 Minutes Intravenous Every 8 hours 10/21/19 1019     10/21/19 0615  ceFEPIme (MAXIPIME) 1 g in sodium chloride 0.9 % 100 mL IVPB  Status:  Discontinued        1 g 200 mL/hr over 30 Minutes Intravenous Every 8 hours 10/21/19 0608 10/21/19 1019        Objective:   Vitals:   10/21/19 0300 10/21/19 0332 10/21/19 1300 10/21/19 1457  BP: (!) 167/102 (!) 163/109 133/82   Pulse: (!) 118 (!) 126 (!) 115   Resp: (!) 27 (!) 22 20   Temp:  99.9 F (37.7 C) 98.6 F (37 C)   TempSrc:  Oral    SpO2: 95% 91% 100% 95%  Weight:      Height:        Wt Readings from Last 3 Encounters:  10/19/19 (!) 158.8 kg  09/29/19 (!) 176.1 kg  09/29/19 (!) 180.5 kg     Intake/Output Summary (Last 24 hours) at 10/21/2019  1801 Last data filed at 10/21/2019 1500 Gross per 24 hour  Intake 50.5 ml  Output --  Net 50.5 ml     Physical Exam  Gen:- Awake Alert, morbidly obese , HEENT:- [place].AT, No sclera icterus Nose- 6 L/min Neck-Supple Neck,No JVD,.  Lungs-diminished breath sounds, scattered rhonchi  CV- S1, S2 normal, regular  Abd-  +ve B.Sounds, Abd Soft, No tenderness, increased truncal adiposity Extremity/Skin:- No significant edema, chronic venous stasis changes noted, pedal pulses present  Psych-affect is appropriate, oriented x3 Neuro-generalized weakness, no new focal deficits, no tremors   Data Review:   Micro Results  Recent Results (from the past 240 hour(s))  Blood Culture (routine x 2)     Status: None (Preliminary result)   Collection Time: 10/19/19  5:13 PM   Specimen: BLOOD RIGHT FOREARM  Result Value Ref Range Status   Specimen Description   Final    BLOOD RIGHT FOREARM Performed at [facility], [address], [place], [place] [address]    Special Requests   Final    BOTTLES DRAWN AEROBIC AND ANAEROBIC Blood Culture adequate volume Performed at [facility], [address] [place], [place] [address]    Culture  Setup Time   Final    ANAEROBIC BOTTLE ONLY GRAM NEGATIVE RODS Gram Stain Report Called to,Read Back By and Verified With: H EVANS,RN @0504  10/21/19 MKELLY Organism ID to follow Performed at [facility], [address], [place], [place] [address]    Culture   Final    NO GROWTH 2 DAYS Performed at [facility], [address], [place], [place] [address]    Report Status PENDING  Incomplete  Blood Culture ID Panel (Reflexed)     Status: Abnormal   Collection Time: 10/19/19  5:13 PM  Result Value Ref Range Status   Enterococcus faecalis NOT DETECTED NOT DETECTED Final   Enterococcus Faecium NOT DETECTED NOT DETECTED Final   Listeria monocytogenes NOT DETECTED NOT DETECTED Final   Staphylococcus species NOT DETECTED NOT DETECTED Final   Staphylococcus aureus  (BCID) NOT DETECTED NOT DETECTED Final   Staphylococcus epidermidis NOT DETECTED NOT DETECTED Final   Staphylococcus lugdunensis NOT DETECTED NOT DETECTED Final   Streptococcus species NOT DETECTED NOT DETECTED Final   Streptococcus agalactiae NOT DETECTED NOT DETECTED Final   Streptococcus pneumoniae NOT DETECTED NOT DETECTED Final   Streptococcus pyogenes NOT DETECTED NOT DETECTED Final   A.calcoaceticus-baumannii NOT DETECTED NOT DETECTED Final   Bacteroides fragilis NOT DETECTED NOT DETECTED Final   Enterobacterales DETECTED (A) NOT DETECTED Final    Comment: Enterobacterales represent a large order of gram negative bacteria, not a single organism. CRITICAL RESULT CALLED TO, READ BACK BY AND VERIFIED WITH: Luz Brazen PharmD 10:10 10/21/19 ([email or web address])    Enterobacter cloacae complex NOT DETECTED NOT DETECTED Final   Escherichia coli DETECTED (A) NOT DETECTED Final    Comment: CRITICAL RESULT CALLED TO, READ BACK BY AND VERIFIED WITH: Luz Brazen PharmD 10:10 10/21/19 ([email or web address])    Klebsiella aerogenes NOT DETECTED NOT DETECTED Final   Klebsiella oxytoca NOT DETECTED NOT DETECTED Final   Klebsiella pneumoniae NOT DETECTED NOT DETECTED Final   Proteus species NOT DETECTED NOT DETECTED Final   Salmonella species NOT DETECTED NOT DETECTED Final   Serratia marcescens NOT DETECTED NOT DETECTED Final   Haemophilus influenzae NOT DETECTED NOT DETECTED Final   Neisseria meningitidis NOT DETECTED NOT DETECTED Final   Pseudomonas aeruginosa NOT DETECTED NOT DETECTED Final   Stenotrophomonas maltophilia NOT DETECTED NOT DETECTED Final   Candida albicans NOT DETECTED NOT DETECTED Final   Candida auris NOT DETECTED NOT DETECTED Final   Candida glabrata NOT DETECTED NOT DETECTED Final   Candida krusei NOT DETECTED NOT DETECTED Final   Candida parapsilosis NOT DETECTED NOT DETECTED Final   Candida tropicalis NOT DETECTED NOT DETECTED Final   Cryptococcus neoformans/gattii NOT DETECTED NOT  DETECTED Final   CTX-M ESBL NOT DETECTED NOT DETECTED Final   Carbapenem resistance IMP NOT DETECTED NOT DETECTED Final   Carbapenem resistance KPC NOT DETECTED NOT DETECTED Final   Carbapenem resistance NDM NOT DETECTED NOT DETECTED Final  Carbapenem resist OXA 48 LIKE NOT DETECTED NOT DETECTED Final   Carbapenem resistance VIM NOT DETECTED NOT DETECTED Final    Comment: Performed at [facility], [address] [place], [place] [address]  Blood Culture (routine x 2)     Status: None (Preliminary result)   Collection Time: 10/19/19  5:38 PM   Specimen: BLOOD  Result Value Ref Range Status   Specimen Description BLOOD LEFT ANTECUBITAL  Final   Special Requests   Final    BOTTLES DRAWN AEROBIC AND ANAEROBIC Blood Culture adequate volume   Culture   Final    NO GROWTH 2 DAYS Performed at [facility], [address] [place], [place] [address]    Report Status PENDING  Incomplete    Radiology Reports DG Chest 2 View  Result Date: 10/19/2019 CLINICAL DATA:  Persistent cough post discharge with COVID. EXAM: CHEST - 2 VIEW COMPARISON:  09/29/2019 FINDINGS: Extensive bilateral airspace opacities. No visible pleural effusions or pneumothorax. Cardiac silhouette is enlarged and accentuated by low lung volumes. No evidence of acute osseous abnormality. IMPRESSION: 1. Extensive bilateral airspace opacities, compatible with multifocal pneumonia. 2. Suspected cardiomegaly. Electronically Signed   By: Feliberto Harts MD   On: 10/19/2019 16:24   CT Angio Chest PE W and/or Wo Contrast  Result Date: 10/19/2019 CLINICAL DATA:  Cough and shortness of breath. COVID-19 virus infection. EXAM: CT ANGIOGRAPHY CHEST WITH CONTRAST TECHNIQUE: Multidetector CT imaging of the chest was performed using the standard protocol during bolus administration of intravenous contrast. Multiplanar CT image reconstructions and MIPs were obtained to evaluate the vascular anatomy. Study was performed twice due to  suboptimal opacification of pulmonary arteries. CONTRAST:  OMNIPAQUE IOHEXOL 350 MG/ML SOLN COMPARISON:  10/03/2019 FINDINGS: Cardiovascular: Suboptimal contrast opacification of the pulmonary arteries is demonstrated on both scans, likely due to contrast injection triggering being compromised by patient breathing during the exam, as well as pulmonary airspace disease. No large central pulmonary emboli are identified, however pulmonary emboli in sub lobar pulmonary arteries cannot be excluded on this exam. Mediastinum/Nodes: No masses or pathologically enlarged lymph nodes identified. Lungs/Pleura: Heterogeneous airspace opacity is again seen throughout both lungs, without significant change since prior exam. No evidence of parenchymal cavitation or pleural effusion. Upper abdomen: No acute findings. Musculoskeletal: No suspicious bone lesions identified. Review of the MIP images confirms the above findings. IMPRESSION: Suboptimal contrast opacification of pulmonary arteries. No large central pulmonary emboli identified, however pulmonary emboli in sub-lobar pulmonary arteries cannot be excluded. No significant change in severe bilateral pulmonary airspace opacity, consistent with viral pneumonia. Electronically Signed   By: Danae Orleans M.D.   On: 10/19/2019 19:45   CT ANGIO CHEST PE W OR WO CONTRAST  Result Date: 10/03/2019 CLINICAL DATA:  Shortness of breath.  COVID positive. EXAM: CT ANGIOGRAPHY CHEST WITH CONTRAST TECHNIQUE: Multidetector CT imaging of the chest was performed using the standard protocol during bolus administration of intravenous contrast. Multiplanar CT image reconstructions and MIPs were obtained to evaluate the vascular anatomy. CONTRAST:  OMNIPAQUE IOHEXOL 350 MG/ML SOLN COMPARISON:  None. FINDINGS: Cardiovascular: Evaluation for pulmonary emboli is limited by respiratory motion artifact and patient body habitus. There is no pulmonary embolus or evidence of right heart  strain. The size of the main pulmonary artery is normal. Heart size is normal, with no pericardial effusion. The course and caliber of the aorta are normal. There is no atherosclerotic calcification. Opacification decreased due to pulmonary arterial phase contrast bolus timing. Mediastinum/Nodes: --  No mediastinal lymphadenopathy. -- No hilar lymphadenopathy. -- No axillary lymphadenopathy. -- No supraclavicular lymphadenopathy. -- Normal thyroid gland where visualized. -  Unremarkable esophagus. Lungs/Pleura: There are diffuse bilateral ground-glass airspace opacities without evidence for pneumothorax or significant pleural effusion. The trachea is unremarkable. Upper Abdomen: Contrast bolus timing is not optimized for evaluation of the abdominal organs. There is probable hepatic steatosis. Musculoskeletal: No chest wall abnormality. No bony spinal canal stenosis. Review of the MIP images confirms the above findings. IMPRESSION: 1. Evaluation for pulmonary emboli is limited by respiratory motion artifact and patient body habitus. Given this limitation, there is no evidence for acute pulmonary embolus. 2. Diffuse bilateral ground-glass airspace opacities, consistent with the patient's history of viral pneumonia. 3. Probable hepatic steatosis. Electronically Signed   By: Katherine Mantle M.D.   On: 10/03/2019 17:42   US Venous Img Lower Bilateral (DVT)  Result Date: 10/21/2019 CLINICAL DATA:  Shortness of breath, COVID-19 positivity, possible deep venous thrombosis. EXAM: BILATERAL LOWER EXTREMITY VENOUS DOPPLER ULTRASOUND TECHNIQUE: Gray-scale sonography with graded compression, as well as color Doppler and duplex ultrasound were performed to evaluate the lower extremity deep venous systems from the level of the common femoral vein and including the common femoral, femoral, profunda femoral, popliteal and calf veins including the posterior tibial, peroneal and gastrocnemius veins when visible. The superficial  great saphenous vein was also interrogated. Spectral Doppler was utilized to evaluate flow at rest and with distal augmentation maneuvers in the common femoral, femoral and popliteal veins. COMPARISON:  None. FINDINGS: RIGHT LOWER EXTREMITY Common Femoral Vein: No evidence of thrombus. Normal compressibility, respiratory phasicity and response to augmentation. Saphenofemoral Junction: No evidence of thrombus. Normal compressibility and flow on color Doppler imaging. Profunda Femoral Vein: No evidence of thrombus. Normal compressibility and flow on color Doppler imaging. Femoral Vein: No evidence of thrombus. Normal compressibility, respiratory phasicity and response to augmentation. Popliteal Vein: No evidence of thrombus. Normal compressibility, respiratory phasicity and response to augmentation. Calf Veins: Calf veins are not well visualized due to the patient's body habitus Superficial Great Saphenous Vein: No evidence of thrombus. Normal compressibility. Venous Reflux:  None. Other Findings:  None. LEFT LOWER EXTREMITY Common Femoral Vein: No evidence of thrombus. Normal compressibility, respiratory phasicity and response to augmentation. Saphenofemoral Junction: No evidence of thrombus. Normal compressibility and flow on color Doppler imaging. Profunda Femoral Vein: No evidence of thrombus. Normal compressibility and flow on color Doppler imaging. Femoral Vein: No evidence of thrombus. Normal compressibility, respiratory phasicity and response to augmentation. Popliteal Vein: No evidence of thrombus. Normal compressibility, respiratory phasicity and response to augmentation. Calf Veins: Calf veins are not well visualized due to the patient's body habitus. Superficial Great Saphenous Vein: No evidence of thrombus. Normal compressibility. Venous Reflux:  None. Other Findings:  None. IMPRESSION: No evidence of deep venous thrombosis in either lower extremity. Electronically Signed   By: Alcide Clever M.D.   On:  10/21/2019 10:24   DG Chest Port 1 View  Result Date: 10/19/2019 CLINICAL DATA:  Shortness of breath. COVID positive. EXAM: PORTABLE CHEST 1 VIEW COMPARISON:  October 19, 2019 (3:56 p.m.) FINDINGS: Persistent moderate to marked severity bilateral infiltrates are seen. Very mildly improved aeration of both lungs is noted. There is no evidence of a pleural effusion or pneumothorax. The cardiac silhouette is mildly enlarged and unchanged in size. The visualized skeletal structures are unremarkable. IMPRESSION: Persistent moderate to marked severity bilateral infiltrates. Electronically Signed   By: Aram Candela M.D.   On: 10/19/2019 18:37  DG Chest Port 1 View  Result Date: 09/29/2019 CLINICAL DATA:  Cough and shortness of breath.  Diffuse pain. EXAM: PORTABLE CHEST 1 VIEW COMPARISON:  None. FINDINGS: Borderline cardiomegaly. Heterogeneous bilateral lung opacities. No pneumothorax or large pleural effusion. Degenerative change of both acromioclavicular joints. No evidence of acute osseous abnormality. IMPRESSION: Heterogeneous bilateral lung opacities, suspicious for multifocal pneumonia, including COVID-19. Electronically Signed   By: Narda RutherfordMelanie  Sanford M.D.   On: 09/29/2019 15:05   ECHOCARDIOGRAM COMPLETE  Result Date: 10/20/2019    ECHOCARDIOGRAM REPORT   Patient Name:   Ricardo ParkRobert E Mcadam Jr. Date of Exam: 10/20/2019 Medical Rec #:  [id number]          Height:       68.0 in Accession #:    [id number]         Weight:       350.0 lb Date of Birth:  Aug 20, 1975         BSA:          2.592 m Patient Age:    43 years           BP:           122/75 mmHg Patient Gender: M                  HR:           104 bpm. [facility] Procedure: 2D Echo, Cardiac Doppler and Color Doppler Indications:    Pulmonary hypertension 416.8 / I27.2  History:        Patient has no prior history of Echocardiogram examinations.                 Risk Factors:Hypertension. Admit with Pneumonia due to COVID-19                  virus, Severe obesity, Acute pulmonary embolism.  Sonographer:    Celesta GentileBernard White RCS Referring Phys: [id number] OLADAPO ADEFESO IMPRESSIONS  1. Left ventricular ejection fraction, by estimation, is 55 to 60%. The left ventricle has normal function. The left ventricle has no regional wall motion abnormalities. There is mild left ventricular hypertrophy. Left ventricular diastolic parameters are indeterminate.  2. Right ventricular systolic function is mildly reduced. The right ventricular size is normal. Tricuspid regurgitation signal is inadequate for assessing PA pressure.  3. Left atrial size was moderately dilated.  4. The mitral valve is grossly normal. Trivial mitral valve regurgitation.  5. The aortic valve is tricuspid. Aortic valve regurgitation is not visualized.  6. The inferior vena cava is dilated in size with <50% respiratory variability, suggesting right atrial pressure of 15 mmHg. FINDINGS  Left Ventricle: Left ventricular ejection fraction, by estimation, is 55 to 60%. The left ventricle has normal function. The left ventricle has no regional wall motion abnormalities. The left ventricular internal cavity size was normal in size. There is  mild left ventricular hypertrophy. Left ventricular diastolic parameters are indeterminate. Right Ventricle: The right ventricular size is normal. No increase in right ventricular wall thickness. Right ventricular systolic function is mildly reduced. Tricuspid regurgitation signal is inadequate for assessing PA pressure. Left Atrium: Left atrial size was moderately dilated. Right Atrium: Right atrial size was normal in size. Pericardium: There is no evidence of pericardial effusion. Mitral Valve: The mitral valve is grossly normal. Trivial mitral valve regurgitation. Tricuspid Valve: The tricuspid valve is grossly normal. Tricuspid valve regurgitation is trivial. Aortic Valve: The aortic valve is tricuspid. Aortic valve regurgitation is  not visualized. Pulmonic  Valve: The pulmonic valve was grossly normal. Pulmonic valve regurgitation is trivial. Aorta: The aortic root is normal in size and structure. Venous: The inferior vena cava is dilated in size with less than 50% respiratory variability, suggesting right atrial pressure of 15 mmHg. IAS/Shunts: No atrial level shunt detected by color flow Doppler.  LEFT VENTRICLE PLAX 2D LVIDd:         5.54 cm LVIDs:         3.86 cm LV PW:         1.27 cm LV IVS:        1.37 cm LVOT diam:     2.50 cm LV SV:         126 LV SV Index:   49 LVOT Area:     4.91 cm  RIGHT VENTRICLE TAPSE (M-mode): 3.6 cm LEFT ATRIUM              Index       RIGHT ATRIUM           Index LA diam:        4.80 cm  1.85 cm/m  RA Area:     20.30 cm LA Vol (A2C):   99.8 ml  38.50 ml/m RA Volume:   63.50 ml  24.49 ml/m LA Vol (A4C):   116.0 ml 44.74 ml/m LA Biplane Vol: 108.0 ml 41.66 ml/m  AORTIC VALVE LVOT Vmax:   126.00 cm/s LVOT Vmean:  76.600 cm/s LVOT VTI:    0.257 m  AORTA Ao Root diam: 3.70 cm MITRAL VALVE MV Area (PHT): 4.49 cm     SHUNTS MV Decel Time: 169 msec     Systemic VTI:  0.26 m MV E velocity: 141.00 cm/s  Systemic Diam: 2.50 cm Nona Dell MD Electronically signed by Nona Dell MD Signature Date/Time: 10/20/2019/3:49:54 PM    Final      CBC Recent Labs  Lab 10/19/19 1713 10/20/19 0615 10/21/19 0655  WBC 11.4* 9.8 13.8*  HGB 9.3* 9.1* 9.0*  HCT 31.1* 30.7* 30.1*  PLT 171 167 201  MCV 68.5* 68.5* 68.3*  MCH 20.5* 20.3* 20.4*  MCHC 29.9* 29.6* 29.9*  RDW 16.9* 16.6* 16.7*  LYMPHSABS 1.1 0.7 1.2  MONOABS 1.1* 0.2 1.3*  EOSABS 0.1 0.0 0.0  BASOSABS 0.0 0.0 0.0    Chemistries  Recent Labs  Lab 10/19/19 1713 10/20/19 0615 10/21/19 0655  NA 136 135 138  K 4.2 4.6 4.4  CL 96* 96* 99  CO2 28 29 29   GLUCOSE 156* 213* 168*  BUN 9 11 17   CREATININE 0.90 0.82 0.97  CALCIUM 8.8* 9.1 9.3  MG  --  2.1 2.3  AST 19 16 18   ALT 29 29 30   ALKPHOS 71 74 76  BILITOT 0.7 0.7 0.3    ------------------------------------------------------------------------------------------------------------------ Recent Labs    10/19/19 1713  TRIG 59    Lab Results  Component Value Date   HGBA1C 7.1 (H) 10/19/2019   ------------------------------------------------------------------------------------------------------------------ No results for input(s): TSH, T4TOTAL, T3FREE, THYROIDAB in the last 72 hours.  Invalid input(s): FREET3 ------------------------------------------------------------------------------------------------------------------ Recent Labs    10/20/19 0615 10/21/19 0655  FERRITIN 289 330  TIBC 305  --   IRON 22*  --     Coagulation profile No results for input(s): INR, PROTIME in the last 168 hours.  Recent Labs    10/20/19 0615 10/21/19 0655  DDIMER 1.26* 1.24*    Cardiac Enzymes No results for input(s): CKMB, TROPONINI, MYOGLOBIN in the last  168 hours.  Invalid input(s): CK ------------------------------------------------------------------------------------------------------------------    Component Value Date/Time   BNP 26.0 09/30/2019 0755     Shon Hale M.D on 10/21/2019 at 6:01 PM  Go to [email or web address] - for contact info  [facility] - Office  [phone]

## 2019-10-21 NOTE — Plan of Care (Signed)

## 2019-10-21 NOTE — ED Notes (Signed)
Attempted to give report 

## 2019-10-21 NOTE — ED Notes (Signed)
ED TO INPATIENT HANDOFF REPORT  ED Nurse Name and Phone #:  Neldon Mc RN [phone]  S Name/Age/Gender Ricardo Lopez. 44 y.o. male Room/Bed: APA18/APA18  Code Status   Code Status: Full Code  Home/SNF/Other Home Patient oriented to: self, place, time and situation Is this baseline? Yes   Triage Complete: Triage complete  Chief Complaint Acute pulmonary embolism (HCC) [I26.99]  Triage Note Pt covid positive on 9/8. Pt sent from Urgent Care for SOB and persistent cough.     Allergies No Known Allergies  Level of Care/Admitting Diagnosis ED Disposition    ED Disposition Condition Comment   Admit  Hospital Area: [facility] [[id number]]  Level of Care: Med-Surg [16]  Covid Evaluation: Confirmed COVID Positive  Diagnosis: Acute pulmonary embolism [facility]) [[id number]]  Admitting Physician: Frankey Shown [[id number]]  Attending Physician: Frankey Shown [[id number]]  Estimated length of stay: past midnight tomorrow  Certification:: I certify this patient will need inpatient services for at least 2 midnights       B Medical/Surgery History Past Medical History:  Diagnosis Date  . Hypertension   . Obesity    History reviewed. No pertinent surgical history.   A IV Location/Drains/Wounds Patient Lines/Drains/Airways Status    Active Line/Drains/Airways    Name Placement date Placement time Site Days   Peripheral IV 10/19/19 Right Forearm 10/19/19  1711  Forearm  2          Intake/Output Last 24 hours  Intake/Output Summary (Last 24 hours) at 10/21/2019 0307 Last data filed at 10/20/2019 0919 Gross per 24 hour  Intake --  Output 900 ml  Net -900 ml    Labs/Imaging Results for orders placed or performed during the hospital encounter of 10/19/19 (from the past 48 hour(s))  Lactic acid, plasma     Status: None   Collection Time: 10/19/19  5:13 PM  Result Value Ref Range   Lactic Acid, Venous 1.5 0.5 - 1.9 mmol/L    Comment: Performed at [facility], [address], [place], [place] [address]  Blood Culture (routine x 2)     Status: None (Preliminary result)   Collection Time: 10/19/19  5:13 PM   Specimen: BLOOD RIGHT FOREARM  Result Value Ref Range   Specimen Description BLOOD RIGHT FOREARM    Special Requests      BOTTLES DRAWN AEROBIC AND ANAEROBIC Blood Culture adequate volume   Culture      NO GROWTH < 24 HOURS Performed at [facility], [address], [place], [place] [address]    Report Status PENDING   CBC WITH DIFFERENTIAL     Status: Abnormal   Collection Time: 10/19/19  5:13 PM  Result Value Ref Range   WBC 11.4 (H) 4.0 - 10.5 K/uL   RBC 4.54 4.22 - 5.81 MIL/uL   Hemoglobin 9.3 (L) 13.0 - 17.0 g/dL   HCT 28.4 (L) 39 - 52 %   MCV 68.5 (L) 80.0 - 100.0 fL   MCH 20.5 (L) 26.0 - 34.0 pg   MCHC 29.9 (L) 30.0 - 36.0 g/dL   RDW 13.2 (H) 44.0 - 10.2 %   Platelets 171 150 - 400 K/uL    Comment: PLATELET COUNT CONFIRMED BY SMEAR SPECIMEN CHECKED FOR CLOTS    nRBC 0.0 0.0 - 0.2 %   Neutrophils Relative % 80 %   Neutro Abs 9.1 (H) 1.7 - 7.7 K/uL   Lymphocytes Relative 10 %   Lymphs Abs 1.1 0.7 - 4.0 K/uL  Monocytes Relative 10 %   Monocytes Absolute 1.1 (H) 0 - 1 K/uL   Eosinophils Relative 0 %   Eosinophils Absolute 0.1 0 - 0 K/uL   Basophils Relative 0 %   Basophils Absolute 0.0 0 - 0 K/uL   Immature Granulocytes 0 %   Abs Immature Granulocytes 0.04 0.00 - 0.07 K/uL    Comment: Performed at [facility], [address], [place], [place] [address]  Comprehensive metabolic panel     Status: Abnormal   Collection Time: 10/19/19  5:13 PM  Result Value Ref Range   Sodium 136 135 - 145 mmol/L   Potassium 4.2 3.5 - 5.1 mmol/L   Chloride 96 (L) 98 - 111 mmol/L   CO2 28 22 - 32 mmol/L   Glucose, Bld 156 (H) 70 - 99 mg/dL    Comment: Glucose reference range applies only to samples taken after fasting for at least 8 hours.   BUN 9 6 - 20 mg/dL   Creatinine, Ser 0.26 0.61 - 1.24 mg/dL   Calcium 8.8 (L) 8.9 - 10.3 mg/dL    Total Protein 7.3 6.5 - 8.1 g/dL   Albumin 2.8 (L) 3.5 - 5.0 g/dL   AST 19 15 - 41 U/L   ALT 29 0 - 44 U/L   Alkaline Phosphatase 71 38 - 126 U/L   Total Bilirubin 0.7 0.3 - 1.2 mg/dL   GFR calc non Af Amer >60 >60 mL/min   GFR calc Af Amer >60 >60 mL/min   Anion gap 12 5 - 15    Comment: Performed at [facility], [address], [place], [place] [address]  D-dimer, quantitative     Status: Abnormal   Collection Time: 10/19/19  5:13 PM  Result Value Ref Range   D-Dimer, Quant 1.59 (H) 0.00 - 0.50 ug/mL-FEU    Comment: (NOTE) At the manufacturer cut-off of 0.50 ug/mL FEU, this assay has been documented to exclude PE with a sensitivity and negative predictive value of 97 to 99%.  At this time, this assay has not been approved by the FDA to exclude DVT/VTE. Results should be correlated with clinical presentation. Performed at [facility], [address] [place], [place] [address]   Procalcitonin     Status: None   Collection Time: 10/19/19  5:13 PM  Result Value Ref Range   Procalcitonin 0.13 ng/mL    Comment:        Interpretation: PCT (Procalcitonin) <= 0.5 ng/mL: Systemic infection (sepsis) is not likely. Local bacterial infection is possible. (NOTE)       Sepsis PCT Algorithm           Lower Respiratory Tract                                      Infection PCT Algorithm    ----------------------------     ----------------------------         PCT < 0.25 ng/mL                PCT < 0.10 ng/mL          Strongly encourage             Strongly discourage   discontinuation of antibiotics    initiation of antibiotics    ----------------------------     -----------------------------       PCT 0.25 - 0.50 ng/mL  PCT 0.10 - 0.25 ng/mL               OR       >80% decrease in PCT            Discourage initiation of                                            antibiotics      Encourage discontinuation           of antibiotics    ----------------------------      -----------------------------         PCT >= 0.50 ng/mL              PCT 0.26 - 0.50 ng/mL               AND        <80% decrease in PCT             Encourage initiation of                                             antibiotics       Encourage continuation           of antibiotics    ----------------------------     -----------------------------        PCT >= 0.50 ng/mL                  PCT > 0.50 ng/mL               AND         increase in PCT                  Strongly encourage                                      initiation of antibiotics    Strongly encourage escalation           of antibiotics                                     -----------------------------                                           PCT <= 0.25 ng/mL                                                 OR                                        > 80% decrease in PCT  Discontinue / Do not initiate                                             antibiotics  Performed at [facility], [address] [place], [place] [address]   Lactate dehydrogenase     Status: None   Collection Time: 10/19/19  5:13 PM  Result Value Ref Range   LDH 174 98 - 192 U/L    Comment: Performed at [facility], [address] [place], [place] [address]  Ferritin     Status: None   Collection Time: 10/19/19  5:13 PM  Result Value Ref Range   Ferritin 233 24 - 336 ng/mL    Comment: Performed at [facility], [address] [place], [place] [address]  Triglycerides     Status: None   Collection Time: 10/19/19  5:13 PM  Result Value Ref Range   Triglycerides 59 <150 mg/dL    Comment: Performed at [facility], [address] [place], [place] [address]  Fibrinogen     Status: Abnormal   Collection Time: 10/19/19  5:13 PM  Result Value Ref Range   Fibrinogen >800 (H) 210 - 475 mg/dL    Comment: Performed at [facility], [address] [place], [place] [address]  C-reactive protein     Status: Abnormal    Collection Time: 10/19/19  5:13 PM  Result Value Ref Range   CRP 22.3 (H) <1.0 mg/dL    Comment: Performed at [facility], [address] [place], [place] [address]  Blood Culture (routine x 2)     Status: None (Preliminary result)   Collection Time: 10/19/19  5:38 PM   Specimen: BLOOD  Result Value Ref Range   Specimen Description BLOOD LEFT ANTECUBITAL    Special Requests      BOTTLES DRAWN AEROBIC AND ANAEROBIC Blood Culture adequate volume   Culture      NO GROWTH < 24 HOURS Performed at [facility], [address] [place], [place] [address]    Report Status PENDING   Hemoglobin A1c     Status: Abnormal   Collection Time: 10/19/19  5:38 PM  Result Value Ref Range   Hgb A1c MFr Bld 7.1 (H) 4.8 - 5.6 %    Comment: (NOTE) Pre diabetes:          5.7%-6.4%  Diabetes:              >6.4%  Glycemic control for   <7.0% adults with diabetes    Mean Plasma Glucose 157.07 mg/dL    Comment: Performed at [facility], [address] [place], [place] [address]  CBG monitoring, ED     Status: Abnormal   Collection Time: 10/20/19 12:36 AM  Result Value Ref Range   Glucose-Capillary 158 (H) 70 - 99 mg/dL    Comment: Glucose reference range applies only to samples taken after fasting for at least 8 hours.  CBC with Differential/Platelet     Status: Abnormal   Collection Time: 10/20/19  6:15 AM  Result Value Ref Range   WBC 9.8 4.0 - 10.5 K/uL   RBC 4.48 4.22 - 5.81 MIL/uL   Hemoglobin 9.1 (L) 13.0 - 17.0 g/dL   HCT 28.430.7 (L) 39 - 52 %   MCV 68.5 (L) 80.0 - 100.0 fL   MCH 20.3 (L) 26.0 - 34.0 pg  MCHC 29.6 (L) 30.0 - 36.0 g/dL   RDW 85.8 (H) 85.0 - 27.7 %   Platelets 167 150 - 400 K/uL   nRBC 0.0 0.0 - 0.2 %   Neutrophils Relative % 91 %   Neutro Abs 8.9 (H) 1.7 - 7.7 K/uL   Lymphocytes Relative 7 %   Lymphs Abs 0.7 0.7 - 4.0 K/uL   Monocytes Relative 2 %   Monocytes Absolute 0.2 0 - 1 K/uL   Eosinophils Relative 0 %   Eosinophils Absolute 0.0 0 - 0 K/uL   Basophils  Relative 0 %   Basophils Absolute 0.0 0 - 0 K/uL   Immature Granulocytes 0 %   Abs Immature Granulocytes 0.03 0.00 - 0.07 K/uL    Comment: Performed at [facility], [address] [place], [place] [address]  Comprehensive metabolic panel     Status: Abnormal   Collection Time: 10/20/19  6:15 AM  Result Value Ref Range   Sodium 135 135 - 145 mmol/L   Potassium 4.6 3.5 - 5.1 mmol/L   Chloride 96 (L) 98 - 111 mmol/L   CO2 29 22 - 32 mmol/L   Glucose, Bld 213 (H) 70 - 99 mg/dL    Comment: Glucose reference range applies only to samples taken after fasting for at least 8 hours.   BUN 11 6 - 20 mg/dL   Creatinine, Ser 8.67 0.61 - 1.24 mg/dL   Calcium 9.1 8.9 - 67.2 mg/dL   Total Protein 7.6 6.5 - 8.1 g/dL   Albumin 2.7 (L) 3.5 - 5.0 g/dL   AST 16 15 - 41 U/L   ALT 29 0 - 44 U/L   Alkaline Phosphatase 74 38 - 126 U/L   Total Bilirubin 0.7 0.3 - 1.2 mg/dL   GFR calc non Af Amer >60 >60 mL/min   GFR calc Af Amer >60 >60 mL/min   Anion gap 10 5 - 15    Comment: Performed at [facility], [address], [place], [place] [address]  C-reactive protein     Status: Abnormal   Collection Time: 10/20/19  6:15 AM  Result Value Ref Range   CRP 25.0 (H) <1.0 mg/dL    Comment: Performed at [facility], [address] [place], [place] [address]  D-dimer, quantitative (not at [facility])     Status: Abnormal   Collection Time: 10/20/19  6:15 AM  Result Value Ref Range   D-Dimer, Quant 1.26 (H) 0.00 - 0.50 ug/mL-FEU    Comment: (NOTE) At the manufacturer cut-off of 0.50 ug/mL FEU, this assay has been documented to exclude PE with a sensitivity and negative predictive value of 97 to 99%.  At this time, this assay has not been approved by the FDA to exclude DVT/VTE. Results should be correlated with clinical presentation. Performed at [facility], [address] [place], [place] [address]   Ferritin     Status: None   Collection Time: 10/20/19  6:15 AM  Result Value Ref Range   Ferritin 289 24 -  336 ng/mL    Comment: Performed at [facility], [address] [place], [place] [address]  Phosphorus     Status: None   Collection Time: 10/20/19  6:15 AM  Result Value Ref Range   Phosphorus 4.4 2.5 - 4.6 mg/dL    Comment: Performed at [facility], [address], [place], [place] [address]  Magnesium     Status: None   Collection Time: 10/20/19  6:15 AM  Result Value Ref  Range   Magnesium 2.1 1.7 - 2.4 mg/dL    Comment: Performed at [facility], [address] [place], [place] [address]  Iron and TIBC     Status: Abnormal   Collection Time: 10/20/19  6:15 AM  Result Value Ref Range   Iron 22 (L) 45 - 182 ug/dL   TIBC 425 956 - 387 ug/dL   Saturation Ratios 7 (L) 17.9 - 39.5 %   UIBC 283 ug/dL    Comment: Performed at [facility], [address] [place], [place] [address]  CBG monitoring, ED     Status: Abnormal   Collection Time: 10/20/19  8:15 AM  Result Value Ref Range   Glucose-Capillary 181 (H) 70 - 99 mg/dL    Comment: Glucose reference range applies only to samples taken after fasting for at least 8 hours.  CBG monitoring, ED     Status: Abnormal   Collection Time: 10/20/19 11:51 AM  Result Value Ref Range   Glucose-Capillary 181 (H) 70 - 99 mg/dL    Comment: Glucose reference range applies only to samples taken after fasting for at least 8 hours.  CBG monitoring, ED     Status: Abnormal   Collection Time: 10/20/19  5:19 PM  Result Value Ref Range   Glucose-Capillary 194 (H) 70 - 99 mg/dL    Comment: Glucose reference range applies only to samples taken after fasting for at least 8 hours.  CBG monitoring, ED     Status: Abnormal   Collection Time: 10/20/19  9:58 PM  Result Value Ref Range   Glucose-Capillary 161 (H) 70 - 99 mg/dL    Comment: Glucose reference range applies only to samples taken after fasting for at least 8 hours.   DG Chest 2 View  Result Date: 10/19/2019 CLINICAL DATA:  Persistent cough post discharge with COVID. EXAM: CHEST - 2 VIEW  COMPARISON:  09/29/2019 FINDINGS: Extensive bilateral airspace opacities. No visible pleural effusions or pneumothorax. Cardiac silhouette is enlarged and accentuated by low lung volumes. No evidence of acute osseous abnormality. IMPRESSION: 1. Extensive bilateral airspace opacities, compatible with multifocal pneumonia. 2. Suspected cardiomegaly. Electronically Signed   By: Feliberto Harts MD   On: 10/19/2019 16:24   CT Angio Chest PE W and/or Wo Contrast  Result Date: 10/19/2019 CLINICAL DATA:  Cough and shortness of breath. COVID-19 virus infection. EXAM: CT ANGIOGRAPHY CHEST WITH CONTRAST TECHNIQUE: Multidetector CT imaging of the chest was performed using the standard protocol during bolus administration of intravenous contrast. Multiplanar CT image reconstructions and MIPs were obtained to evaluate the vascular anatomy. Study was performed twice due to suboptimal opacification of pulmonary arteries. CONTRAST:  OMNIPAQUE IOHEXOL 350 MG/ML SOLN COMPARISON:  10/03/2019 FINDINGS: Cardiovascular: Suboptimal contrast opacification of the pulmonary arteries is demonstrated on both scans, likely due to contrast injection triggering being compromised by patient breathing during the exam, as well as pulmonary airspace disease. No large central pulmonary emboli are identified, however pulmonary emboli in sub lobar pulmonary arteries cannot be excluded on this exam. Mediastinum/Nodes: No masses or pathologically enlarged lymph nodes identified. Lungs/Pleura: Heterogeneous airspace opacity is again seen throughout both lungs, without significant change since prior exam. No evidence of parenchymal cavitation or pleural effusion. Upper abdomen: No acute findings. Musculoskeletal: No suspicious bone lesions identified. Review of the MIP images confirms the above findings. IMPRESSION: Suboptimal contrast opacification of pulmonary arteries. No large central pulmonary emboli identified, however pulmonary emboli in  sub-lobar pulmonary arteries cannot be excluded. No significant change in  severe bilateral pulmonary airspace opacity, consistent with viral pneumonia. Electronically Signed   By: Danae Orleans M.D.   On: 10/19/2019 19:45   DG Chest Port 1 View  Result Date: 10/19/2019 CLINICAL DATA:  Shortness of breath. COVID positive. EXAM: PORTABLE CHEST 1 VIEW COMPARISON:  October 19, 2019 (3:56 p.m.) FINDINGS: Persistent moderate to marked severity bilateral infiltrates are seen. Very mildly improved aeration of both lungs is noted. There is no evidence of a pleural effusion or pneumothorax. The cardiac silhouette is mildly enlarged and unchanged in size. The visualized skeletal structures are unremarkable. IMPRESSION: Persistent moderate to marked severity bilateral infiltrates. Electronically Signed   By: Aram Candela M.D.   On: 10/19/2019 18:37   ECHOCARDIOGRAM COMPLETE  Result Date: 10/20/2019    ECHOCARDIOGRAM REPORT   Patient Name:   Ricardo Lopez. Date of Exam: 10/20/2019 Medical Rec #:  [id number]          Height:       68.0 in Accession #:    [id number]         Weight:       350.0 lb Date of Birth:  1975/12/02         BSA:          2.592 m Patient Age:    43 years           BP:           122/75 mmHg Patient Gender: M                  HR:           104 bpm. [facility] Procedure: 2D Echo, Cardiac Doppler and Color Doppler Indications:    Pulmonary hypertension 416.8 / I27.2  History:        Patient has no prior history of Echocardiogram examinations.                 Risk Factors:Hypertension. Admit with Pneumonia due to COVID-19                 virus, Severe obesity, Acute pulmonary embolism.  Sonographer:    Celesta Gentile RCS Referring Phys: [id number] OLADAPO ADEFESO IMPRESSIONS  1. Left ventricular ejection fraction, by estimation, is 55 to 60%. The left ventricle has normal function. The left ventricle has no regional wall motion abnormalities. There is mild left ventricular  hypertrophy. Left ventricular diastolic parameters are indeterminate.  2. Right ventricular systolic function is mildly reduced. The right ventricular size is normal. Tricuspid regurgitation signal is inadequate for assessing PA pressure.  3. Left atrial size was moderately dilated.  4. The mitral valve is grossly normal. Trivial mitral valve regurgitation.  5. The aortic valve is tricuspid. Aortic valve regurgitation is not visualized.  6. The inferior vena cava is dilated in size with <50% respiratory variability, suggesting right atrial pressure of 15 mmHg. FINDINGS  Left Ventricle: Left ventricular ejection fraction, by estimation, is 55 to 60%. The left ventricle has normal function. The left ventricle has no regional wall motion abnormalities. The left ventricular internal cavity size was normal in size. There is  mild left ventricular hypertrophy. Left ventricular diastolic parameters are indeterminate. Right Ventricle: The right ventricular size is normal. No increase in right ventricular wall thickness. Right ventricular systolic function is mildly reduced. Tricuspid regurgitation signal is inadequate for assessing PA pressure. Left Atrium: Left atrial size was moderately dilated. Right Atrium: Right atrial size was normal in size. Pericardium:  There is no evidence of pericardial effusion. Mitral Valve: The mitral valve is grossly normal. Trivial mitral valve regurgitation. Tricuspid Valve: The tricuspid valve is grossly normal. Tricuspid valve regurgitation is trivial. Aortic Valve: The aortic valve is tricuspid. Aortic valve regurgitation is not visualized. Pulmonic Valve: The pulmonic valve was grossly normal. Pulmonic valve regurgitation is trivial. Aorta: The aortic root is normal in size and structure. Venous: The inferior vena cava is dilated in size with less than 50% respiratory variability, suggesting right atrial pressure of 15 mmHg. IAS/Shunts: No atrial level shunt detected by color flow  Doppler.  LEFT VENTRICLE PLAX 2D LVIDd:         5.54 cm LVIDs:         3.86 cm LV PW:         1.27 cm LV IVS:        1.37 cm LVOT diam:     2.50 cm LV SV:         126 LV SV Index:   49 LVOT Area:     4.91 cm  RIGHT VENTRICLE TAPSE (M-mode): 3.6 cm LEFT ATRIUM              Index       RIGHT ATRIUM           Index LA diam:        4.80 cm  1.85 cm/m  RA Area:     20.30 cm LA Vol (A2C):   99.8 ml  38.50 ml/m RA Volume:   63.50 ml  24.49 ml/m LA Vol (A4C):   116.0 ml 44.74 ml/m LA Biplane Vol: 108.0 ml 41.66 ml/m  AORTIC VALVE LVOT Vmax:   126.00 cm/s LVOT Vmean:  76.600 cm/s LVOT VTI:    0.257 m  AORTA Ao Root diam: 3.70 cm MITRAL VALVE MV Area (PHT): 4.49 cm     SHUNTS MV Decel Time: 169 msec     Systemic VTI:  0.26 m MV E velocity: 141.00 cm/s  Systemic Diam: 2.50 cm Nona Dell MD Electronically signed by Nona Dell MD Signature Date/Time: 10/20/2019/3:49:54 PM    Final     Pending Labs Unresulted Labs (From admission, onward)          Start     Ordered   10/20/19 0500  CBC with Differential/Platelet  Daily,   R      10/19/19 2200   10/20/19 0500  Comprehensive metabolic panel  Daily,   R      10/19/19 2200   10/20/19 0500  C-reactive protein  Daily,   R      10/19/19 2200   10/20/19 0500  D-dimer, quantitative (not at [facility])  Daily,   R      10/19/19 2200   10/20/19 0500  Ferritin  Daily,   R      10/19/19 2200   10/20/19 0500  Phosphorus  Daily,   R      10/19/19 2200   10/20/19 0500  Magnesium  Daily,   R      10/19/19 2200          Vitals/Pain Today's Vitals   10/21/19 0130 10/21/19 0200 10/21/19 0230 10/21/19 0300  BP: (!) 161/106 (!) 159/102 (!) 153/96 (!) 167/102  Pulse: (!) 118 (!) 136 (!) 116 (!) 118  Resp: (!) 31 (!) 33 (!) 31 (!) 27  Temp:      TempSrc:      SpO2: 96% (!) 89% 92% 95%  Weight:  Height:      PainSc:        Isolation Precautions Airborne and Contact precautions  Medications Medications  albuterol (VENTOLIN HFA) 108 (90 Base)  MCG/ACT inhaler 2 puff (has no administration in time range)  baricitinib (OLUMIANT) tablet 4 mg (4 mg Oral Given 10/20/19 0855)  albuterol (VENTOLIN HFA) 108 (90 Base) MCG/ACT inhaler 2 puff (2 puffs Inhalation Given 10/21/19 0118)  guaiFENesin-dextromethorphan (ROBITUSSIN DM) 100-10 MG/5ML syrup 10 mL (10 mLs Oral Given 10/20/19 1959)  chlorpheniramine-HYDROcodone (TUSSIONEX) 10-8 MG/5ML suspension 5 mL (5 mLs Oral Given 10/21/19 0118)  ascorbic acid (VITAMIN C) tablet 500 mg (500 mg Oral Given 10/20/19 0855)  zinc sulfate capsule 220 mg (220 mg Oral Given 10/20/19 0856)  dextromethorphan-guaiFENesin (MUCINEX DM) 30-600 MG per 12 hr tablet 1 tablet (1 tablet Oral Given 10/20/19 2206)  insulin aspart (novoLOG) injection 0-9 Units (2 Units Subcutaneous Given 10/20/19 1750)  insulin aspart (novoLOG) injection 0-5 Units (0 Units Subcutaneous Not Given 10/20/19 2159)  feeding supplement (GLUCERNA SHAKE) (GLUCERNA SHAKE) liquid 237 mL (237 mLs Oral Given 10/20/19 1938)  enoxaparin (LOVENOX) injection 80 mg (80 mg Subcutaneous Given 10/20/19 0854)  hydrocortisone (ANUSOL-HC) suppository 25 mg (25 mg Rectal Refused 10/20/19 2133)  methylPREDNISolone sodium succinate (SOLU-MEDROL) 125 mg/2 mL injection 60 mg (60 mg Intravenous Given 10/20/19 2210)  polyethylene glycol (MIRALAX / GLYCOLAX) packet 17 g (17 g Oral Refused 10/20/19 1938)  senna-docusate (Senokot-S) tablet 2 tablet (2 tablets Oral Refused 10/20/19 2134)  pantoprazole (PROTONIX) EC tablet 40 mg (40 mg Oral Given 10/20/19 2206)  acetaminophen (TYLENOL) tablet 1,000 mg (1,000 mg Oral Given 10/19/19 1827)  iohexol (OMNIPAQUE) 350 MG/ML injection 100 mL (180 mLs Intravenous Contrast Given 10/19/19 1924)  methylPREDNISolone sodium succinate (SOLU-MEDROL) 125 mg/2 mL injection 125 mg (125 mg Intravenous Given 10/19/19 2314)  enoxaparin (LOVENOX) injection 150 mg (150 mg Subcutaneous Given 10/19/19 2318)    Mobility walks Low fall risk   Focused  Assessments    R Recommendations: See Admitting Provider Note  Report given to: 300 RN  Additional Notes:

## 2019-10-22 DIAGNOSIS — J069 Acute upper respiratory infection, unspecified: Secondary | ICD-10-CM | POA: Diagnosis present

## 2019-10-22 DIAGNOSIS — B9620 Unspecified Escherichia coli [E. coli] as the cause of diseases classified elsewhere: Secondary | ICD-10-CM | POA: Diagnosis present

## 2019-10-22 LAB — CBC WITH DIFFERENTIAL/PLATELET
Abs Immature Granulocytes: 0.06 10*3/uL (ref 0.00–0.07)
Basophils Absolute: 0 10*3/uL (ref 0.0–0.1)
Basophils Relative: 0 %
Eosinophils Absolute: 0 10*3/uL (ref 0.0–0.5)
Eosinophils Relative: 0 %
HCT: 29.9 % — ABNORMAL LOW (ref 39.0–52.0)
Hemoglobin: 8.9 g/dL — ABNORMAL LOW (ref 13.0–17.0)
Immature Granulocytes: 1 %
Lymphocytes Relative: 11 %
Lymphs Abs: 1.2 10*3/uL (ref 0.7–4.0)
MCH: 20.1 pg — ABNORMAL LOW (ref 26.0–34.0)
MCHC: 29.8 g/dL — ABNORMAL LOW (ref 30.0–36.0)
MCV: 67.6 fL — ABNORMAL LOW (ref 80.0–100.0)
Monocytes Absolute: 1.3 10*3/uL — ABNORMAL HIGH (ref 0.1–1.0)
Monocytes Relative: 11 %
Neutro Abs: 9 10*3/uL — ABNORMAL HIGH (ref 1.7–7.7)
Neutrophils Relative %: 77 %
Platelets: 191 10*3/uL (ref 150–400)
RBC: 4.42 MIL/uL (ref 4.22–5.81)
RDW: 16.1 % — ABNORMAL HIGH (ref 11.5–15.5)
WBC: 11.6 10*3/uL — ABNORMAL HIGH (ref 4.0–10.5)
nRBC: 0 % (ref 0.0–0.2)

## 2019-10-22 LAB — COMPREHENSIVE METABOLIC PANEL
ALT: 64 U/L — ABNORMAL HIGH (ref 0–44)
AST: 51 U/L — ABNORMAL HIGH (ref 15–41)
Albumin: 2.7 g/dL — ABNORMAL LOW (ref 3.5–5.0)
Alkaline Phosphatase: 77 U/L (ref 38–126)
Anion gap: 9 (ref 5–15)
BUN: 18 mg/dL (ref 6–20)
CO2: 28 mmol/L (ref 22–32)
Calcium: 9 mg/dL (ref 8.9–10.3)
Chloride: 99 mmol/L (ref 98–111)
Creatinine, Ser: 0.95 mg/dL (ref 0.61–1.24)
GFR calc Af Amer: >60 mL/min (ref >60)
GFR calc non Af Amer: >60 mL/min (ref >60)
Glucose, Bld: 182 mg/dL — ABNORMAL HIGH (ref 70–99)
Potassium: 4.7 mmol/L (ref 3.5–5.1)
Sodium: 136 mmol/L (ref 135–145)
Total Bilirubin: 0.4 mg/dL (ref 0.3–1.2)
Total Protein: 7 g/dL (ref 6.5–8.1)

## 2019-10-22 LAB — D-DIMER, QUANTITATIVE: D-Dimer, Quant: 1.27 ug/mL-FEU — ABNORMAL HIGH (ref 0.00–0.50)

## 2019-10-22 LAB — FERRITIN: Ferritin: 328 ng/mL (ref 24–336)

## 2019-10-22 LAB — C-REACTIVE PROTEIN: CRP: 8.5 mg/dL — ABNORMAL HIGH (ref <1.0)

## 2019-10-22 LAB — PHOSPHORUS: Phosphorus: 4.4 mg/dL (ref 2.5–4.6)

## 2019-10-22 LAB — MAGNESIUM: Magnesium: 2.3 mg/dL (ref 1.7–2.4)

## 2019-10-22 LAB — GLUCOSE, CAPILLARY
Glucose-Capillary: 160 mg/dL — ABNORMAL HIGH (ref 70–99)
Glucose-Capillary: 192 mg/dL — ABNORMAL HIGH (ref 70–99)

## 2019-10-22 MED ORDER — ZINC SULFATE 220 (50 ZN) MG PO CAPS: 220.0000 mg | ORAL_CAPSULE | Freq: Every day | ORAL | 0 refills | Status: DC

## 2019-10-22 MED ORDER — ALBUTEROL SULFATE HFA 108 (90 BASE) MCG/ACT IN AERS: 2.0000 | INHALATION_SPRAY | RESPIRATORY_TRACT | 2 refills | Status: DC | PRN

## 2019-10-22 MED ORDER — POLYETHYLENE GLYCOL 3350 17 G PO PACK: 17.0000 g | PACK | Freq: Every day | ORAL | 0 refills | Status: DC

## 2019-10-22 MED ORDER — SENNOSIDES-DOCUSATE SODIUM 8.6-50 MG PO TABS: 2.0000 | ORAL_TABLET | Freq: Every day | ORAL | 1 refills | Status: DC

## 2019-10-22 MED ORDER — HYDROCORTISONE ACETATE 25 MG RE SUPP: 25.0000 mg | Freq: Two times a day (BID) | RECTAL | 0 refills | Status: DC

## 2019-10-22 MED ORDER — ASCORBIC ACID 500 MG PO TABS: 500.0000 mg | ORAL_TABLET | Freq: Every day | ORAL | 1 refills | Status: AC

## 2019-10-22 MED ORDER — PREDNISONE 20 MG PO TABS: 40.0000 mg | ORAL_TABLET | Freq: Every day | ORAL | 0 refills | Status: DC

## 2019-10-22 MED ORDER — HYDROCOD POLST-CPM POLST ER 10-8 MG/5ML PO SUER
5.0000 mL | Freq: Two times a day (BID) | ORAL | 0 refills | Status: DC | PRN
Start: 2019-10-22 — End: 2019-11-23

## 2019-10-22 MED ORDER — PANTOPRAZOLE SODIUM 40 MG PO TBEC
40.0000 mg | DELAYED_RELEASE_TABLET | Freq: Every day | ORAL | 1 refills | Status: DC
Start: 2019-10-22 — End: 2020-04-18

## 2019-10-22 MED ORDER — CEFDINIR 300 MG PO CAPS: 300.0000 mg | ORAL_CAPSULE | Freq: Two times a day (BID) | ORAL | 0 refills | Status: AC

## 2019-10-22 NOTE — Progress Notes (Signed)
IV removed, site WNL.  Pt given d/c instructions and new prescriptions.  Discussed all home medications (when, how, and why to take), patient verbalizes understanding. Discussed home care with patient, teachback completed. F/U appointment to be made by patient - advised to take d/c paper work with recommended f/u lab test, pt states they will make and keep appointment. Pt is stable at this time.

## 2019-10-22 NOTE — Discharge Summary (Signed)
Ricardo ParkRobert E Patil Jr., is a 44 y.o. male  DOB 1975-11-28  MRN [id number].  Admission date:  10/19/2019  Admitting Physician  Frankey Shownladapo Adefeso, DO  Discharge Date:  10/22/2019   Primary MD  Donita BrooksPickard, Warren T, MD  Recommendations for primary care physician for things to follow:   1)Avoid ibuprofen/Advil/Aleve/Motrin/Goody Powders/Naproxen/BC powders/Meloxicam/Diclofenac/Indomethacin and other Nonsteroidal anti-inflammatory medications as these will make you more likely to bleed and can cause stomach ulcers, can also cause Kidney problems.   2)you need oxygen at home at 2 L via nasal cannula continuously while awake and while asleep--- smoking or having open fires around oxygen can cause fire, significant injury and death  3) please use Anusol HC suppository as prescribed to treat your hemorrhoids to prevent further rectal bleeding  4) You are strongly advised to  to isolate/quarantine for at least 21 days from the date of your diagnoses with COVID-19 infection--please always wear a mask if you have to go outside the house  5)Please repeat CBC and CMP with your primary care physician within the next 10 to 14 days - call to set this appointment up  6) you have iron deficiency anemia--you need to follow-up with gastroenterologist as outpatient for colonoscopy after resolution of feel respiratory problems from Covid   Admission Diagnosis  Acute pulmonary embolism (HCC) [I26.99] Pulmonary emboli (HCC) [I26.99] Pneumonia due to COVID-19 virus [U07.1, J12.82]   Discharge Diagnosis  Acute pulmonary embolism (HCC) [I26.99] Pulmonary emboli (HCC) [I26.99] Pneumonia due to COVID-19 virus [U07.1, J12.82]    Principal Problem:   Pneumonia due to COVID-19 virus Active Problems:   Acute respiratory failure with hypoxia due to Covid    E coli bacteremia   Severe obesity (BMI >= 40) (HCC)   Hypertension   Microcytic  anemia   Hyperglycemia   Hypoalbuminemia   Acute respiratory disease due to COVID-19 virus      Past Medical History:  Diagnosis Date   Hypertension    Obesity     History reviewed. No pertinent surgical history.    HPI  from the history and physical done on the day of admission:    Chief Complaint: Shortness of breath  HPI: Ricardo ParkRobert E Ricardo Jr. is a 44 y.o. male with medical history significant for hypertension and obesity who presents to the emergency department due to worsening chest congestion.  Patient was recently admitted and discharged from this facility from 9/8-9/16 due to acute respiratory failure secondary to COVID-19 pneumonia during which he was treated with IV remdesivir (eventually completed doses) and baricitinib (which unfortunately was discontinued due to elevated LFTs at that time) and was discharged with supplemental oxygen at 2 LPM via [place].  Patient presents to an urgent care today due to worsening chest congestion with increased productive cough and weakness since yesterday, chest x-ray done at urgent care showed worsening multifocal pneumonia and patient was asked to go to ED for further evaluation management.  He denies chest pain, headache, nausea, vomiting or abdominal pain.  ED Course:  In the  emergency department, he was febrile with a temperature of 101.70F, patient was also tachycardic and tachypneic.  Work-up in the ED showed leukocytosis, microcytic anemia and hyperglycemia.  Albumin 2.8, fibrinogen > 800 (789, 2 weeks ago), CRP 22.3 (0.6, 12 days ago), D-dimer 1.59, ferritin 233.  CT angiography of chest ruled out large central pulmonary emboli, but suggestive of sublobar pulmonary artery emboli with no significant change in severe bilateral pulmonary airspace opacity, consistent with viral pneumonia.  Chest x-ray shows persistent moderate to marked severity bilateral infiltrates.  She was treated with Tylenol and IV Solu-Medrol 25 Mg x1 was given.  Hospitalist  was asked to admit patient for further evaluation management.     Hospital Course:     Brief Summary:- 44 y.o.malewith medical history significant forhypertension and obesity readmitted on 10/19/2027 with worsening hypoxia and chest imaging findings suggestive of worsening multifocal pneumonia in the setting of recent diagnosis of Covid infection -Patient is Not vaccinated against COVID-19 Pt had E coli in Anaerobic Bottle only    A/p 1) acute hypoxic respiratory failure secondary to COVID-19 infection----previously completed remdesivir -Oxygen requirement improved, patient has been weaned down from 6 L to 2 L of nasal cannula -Did not complete baricitinib in the past due to LFTs being elevated -Treated with supplemental oxygen, baricitinib and steroids-LFTs -wnl -echo with preserved EF of 55 to 60% without regional wall motion normalities -CTA chest with poor opacification of contrast, but no definite PE -Lower extremity venous Dopplers without DVT COVID-19 Labs  Recent Labs    10/19/19 1713 10/19/19 1713 10/20/19 0615 10/21/19 0655 10/22/19 0435  DDIMER 1.59*   < > 1.26* 1.24* 1.27*  FERRITIN 233   < > 289 330 328  LDH 174  --   --   --   --   CRP 22.3*   < > 25.0* 13.8* 8.5*   < > = values in this interval not displayed.    Lab Results  Component Value Date   SARSCOV2NAA POSITIVE (A) 09/29/2019   2) E. coli bacteremia--- blood cultures with E. coli from anaerobic bottle only,  -Treated with IV cefepime, BCID suggest non-ESBL -Discharge on Omnicef  3)DM2-A1c 7.1 reflecting uncontrolled DM  -blood sugars may have worsened due to steroid therapy for Covid infection  4) iron deficiency anemia--- serum iron is 22 with saturation of 7, ferritin is probably not low due to COVID-19 infection =--Patient will need endoluminal evaluation as outpatient after resolution of his acute respiratory symptoms -Hgb currently stable around 9 this admission however, hemoglobin 2  weeks ago was above 11 -Anusol HC for hemorrhoids -Outpatient follow-up with GI for possible colonoscopy advised -PPI as ordered  5)Morbid Obesity- -after resolution of acute respiratory symptoms patient will be advised to comply with low calorie diet, portion control and increase physical activity discussed with patient -Body mass index is 53.22 kg/m.   Discharge Condition: -Stable   Follow UP--- outpatient follow-up with GI physician   Consults obtained - NA  Diet and Activity recommendation:  As advised  Discharge Instructions    Discharge Instructions    Call MD for:  difficulty breathing, headache or visual disturbances   Complete by: As directed    Call MD for:  extreme fatigue   Complete by: As directed    Call MD for:  persistant dizziness or light-headedness   Complete by: As directed    Call MD for:  persistant nausea and vomiting   Complete by: As directed    Call MD  for:  severe uncontrolled pain   Complete by: As directed    Call MD for:  temperature >100.4   Complete by: As directed    Diet - low sodium heart healthy   Complete by: As directed    Discharge instructions   Complete by: As directed    1)Avoid ibuprofen/Advil/Aleve/Motrin/Goody Powders/Naproxen/BC powders/Meloxicam/Diclofenac/Indomethacin and other Nonsteroidal anti-inflammatory medications as these will make you more likely to bleed and can cause stomach ulcers, can also cause Kidney problems.   2)you need oxygen at home at 2 L via nasal cannula continuously while awake and while asleep--- smoking or having open fires around oxygen can cause fire, significant injury and death  3) please use Anusol HC suppository as prescribed to treat your hemorrhoids to prevent further rectal bleeding  4) You are strongly advised to  to isolate/quarantine for at least 21 days from the date of your diagnoses with COVID-19 infection--please always wear a mask if you have to go outside the house  5)Please  repeat CBC and CMP with your primary care physician within the next 10 to 14 days - call to set this appointment up  6) you have iron deficiency anemia--you need to follow-up with gastroenterologist as outpatient for colonoscopy after resolution of feel respiratory problems from Covid   Increase activity slowly   Complete by: As directed         Discharge Medications     Allergies as of 10/22/2019   No Known Allergies     Medication List    TAKE these medications   acetaminophen 325 MG tablet Commonly known as: TYLENOL Take 650 mg by mouth every 6 (six) hours as needed.   albuterol 108 (90 Base) MCG/ACT inhaler Commonly known as: VENTOLIN HFA Inhale 2 puffs into the lungs every 4 (four) hours as needed for wheezing or shortness of breath. What changed: when to take this   ascorbic acid 500 MG tablet Commonly known as: VITAMIN C Take 1 tablet (500 mg total) by mouth daily. Start taking on: October 23, 2019   cefdinir 300 MG capsule Commonly known as: OMNICEF Take 1 capsule (300 mg total) by mouth 2 (two) times daily for 5 days.   chlorpheniramine-HYDROcodone 10-8 MG/5ML Suer Commonly known as: TUSSIONEX Take 5 mLs by mouth every 12 (twelve) hours as needed for cough.   guaiFENesin-dextromethorphan 100-10 MG/5ML syrup Commonly known as: ROBITUSSIN DM Take 10 mLs by mouth every 4 (four) hours as needed for cough.   hydrocortisone 25 MG suppository Commonly known as: ANUSOL-HC Place 1 suppository (25 mg total) rectally 2 (two) times daily.   multivitamin tablet Take 1 tablet by mouth daily.   pantoprazole 40 MG tablet Commonly known as: PROTONIX Take 1 tablet (40 mg total) by mouth daily.   polyethylene glycol 17 g packet Commonly known as: MIRALAX / GLYCOLAX Take 17 g by mouth daily. Start taking on: October 23, 2019   predniSONE 20 MG tablet Commonly known as: Deltasone Take 2 tablets (40 mg total) by mouth daily with breakfast.   senna-docusate 8.6-50 MG  tablet Commonly known as: Senokot-S Take 2 tablets by mouth at bedtime.   zinc sulfate 220 (50 Zn) MG capsule Take 1 capsule (220 mg total) by mouth daily. Start taking on: October 23, 2019       Major procedures and Radiology Reports - PLEASE review detailed and final reports for all details, in brief -    DG Chest 2 View  Result Date: 10/19/2019 CLINICAL DATA:  Persistent  cough post discharge with COVID. EXAM: CHEST - 2 VIEW COMPARISON:  09/29/2019 FINDINGS: Extensive bilateral airspace opacities. No visible pleural effusions or pneumothorax. Cardiac silhouette is enlarged and accentuated by low lung volumes. No evidence of acute osseous abnormality. IMPRESSION: 1. Extensive bilateral airspace opacities, compatible with multifocal pneumonia. 2. Suspected cardiomegaly. Electronically Signed   By: Feliberto Harts MD   On: 10/19/2019 16:24   CT Angio Chest PE W and/or Wo Contrast  Result Date: 10/19/2019 CLINICAL DATA:  Cough and shortness of breath. COVID-19 virus infection. EXAM: CT ANGIOGRAPHY CHEST WITH CONTRAST TECHNIQUE: Multidetector CT imaging of the chest was performed using the standard protocol during bolus administration of intravenous contrast. Multiplanar CT image reconstructions and MIPs were obtained to evaluate the vascular anatomy. Study was performed twice due to suboptimal opacification of pulmonary arteries. CONTRAST:  OMNIPAQUE IOHEXOL 350 MG/ML SOLN COMPARISON:  10/03/2019 FINDINGS: Cardiovascular: Suboptimal contrast opacification of the pulmonary arteries is demonstrated on both scans, likely due to contrast injection triggering being compromised by patient breathing during the exam, as well as pulmonary airspace disease. No large central pulmonary emboli are identified, however pulmonary emboli in sub lobar pulmonary arteries cannot be excluded on this exam. Mediastinum/Nodes: No masses or pathologically enlarged lymph nodes identified. Lungs/Pleura: Heterogeneous  airspace opacity is again seen throughout both lungs, without significant change since prior exam. No evidence of parenchymal cavitation or pleural effusion. Upper abdomen: No acute findings. Musculoskeletal: No suspicious bone lesions identified. Review of the MIP images confirms the above findings. IMPRESSION: Suboptimal contrast opacification of pulmonary arteries. No large central pulmonary emboli identified, however pulmonary emboli in sub-lobar pulmonary arteries cannot be excluded. No significant change in severe bilateral pulmonary airspace opacity, consistent with viral pneumonia. Electronically Signed   By: Danae Orleans M.D.   On: 10/19/2019 19:45   CT ANGIO CHEST PE W OR WO CONTRAST  Result Date: 10/03/2019 CLINICAL DATA:  Shortness of breath.  COVID positive. EXAM: CT ANGIOGRAPHY CHEST WITH CONTRAST TECHNIQUE: Multidetector CT imaging of the chest was performed using the standard protocol during bolus administration of intravenous contrast. Multiplanar CT image reconstructions and MIPs were obtained to evaluate the vascular anatomy. CONTRAST:  OMNIPAQUE IOHEXOL 350 MG/ML SOLN COMPARISON:  None. FINDINGS: Cardiovascular: Evaluation for pulmonary emboli is limited by respiratory motion artifact and patient body habitus. There is no pulmonary embolus or evidence of right heart strain. The size of the main pulmonary artery is normal. Heart size is normal, with no pericardial effusion. The course and caliber of the aorta are normal. There is no atherosclerotic calcification. Opacification decreased due to pulmonary arterial phase contrast bolus timing. Mediastinum/Nodes: -- No mediastinal lymphadenopathy. -- No hilar lymphadenopathy. -- No axillary lymphadenopathy. -- No supraclavicular lymphadenopathy. -- Normal thyroid gland where visualized. -  Unremarkable esophagus. Lungs/Pleura: There are diffuse bilateral ground-glass airspace opacities without evidence for pneumothorax or significant pleural  effusion. The trachea is unremarkable. Upper Abdomen: Contrast bolus timing is not optimized for evaluation of the abdominal organs. There is probable hepatic steatosis. Musculoskeletal: No chest wall abnormality. No bony spinal canal stenosis. Review of the MIP images confirms the above findings. IMPRESSION: 1. Evaluation for pulmonary emboli is limited by respiratory motion artifact and patient body habitus. Given this limitation, there is no evidence for acute pulmonary embolus. 2. Diffuse bilateral ground-glass airspace opacities, consistent with the patient's history of viral pneumonia. 3. Probable hepatic steatosis. Electronically Signed   By: Katherine Mantle M.D.   On: 10/03/2019 17:42   US Venous  Img Lower Bilateral (DVT)  Result Date: 10/21/2019 CLINICAL DATA:  Shortness of breath, COVID-19 positivity, possible deep venous thrombosis. EXAM: BILATERAL LOWER EXTREMITY VENOUS DOPPLER ULTRASOUND TECHNIQUE: Gray-scale sonography with graded compression, as well as color Doppler and duplex ultrasound were performed to evaluate the lower extremity deep venous systems from the level of the common femoral vein and including the common femoral, femoral, profunda femoral, popliteal and calf veins including the posterior tibial, peroneal and gastrocnemius veins when visible. The superficial great saphenous vein was also interrogated. Spectral Doppler was utilized to evaluate flow at rest and with distal augmentation maneuvers in the common femoral, femoral and popliteal veins. COMPARISON:  None. FINDINGS: RIGHT LOWER EXTREMITY Common Femoral Vein: No evidence of thrombus. Normal compressibility, respiratory phasicity and response to augmentation. Saphenofemoral Junction: No evidence of thrombus. Normal compressibility and flow on color Doppler imaging. Profunda Femoral Vein: No evidence of thrombus. Normal compressibility and flow on color Doppler imaging. Femoral Vein: No evidence of thrombus. Normal  compressibility, respiratory phasicity and response to augmentation. Popliteal Vein: No evidence of thrombus. Normal compressibility, respiratory phasicity and response to augmentation. Calf Veins: Calf veins are not well visualized due to the patient's body habitus Superficial Great Saphenous Vein: No evidence of thrombus. Normal compressibility. Venous Reflux:  None. Other Findings:  None. LEFT LOWER EXTREMITY Common Femoral Vein: No evidence of thrombus. Normal compressibility, respiratory phasicity and response to augmentation. Saphenofemoral Junction: No evidence of thrombus. Normal compressibility and flow on color Doppler imaging. Profunda Femoral Vein: No evidence of thrombus. Normal compressibility and flow on color Doppler imaging. Femoral Vein: No evidence of thrombus. Normal compressibility, respiratory phasicity and response to augmentation. Popliteal Vein: No evidence of thrombus. Normal compressibility, respiratory phasicity and response to augmentation. Calf Veins: Calf veins are not well visualized due to the patient's body habitus. Superficial Great Saphenous Vein: No evidence of thrombus. Normal compressibility. Venous Reflux:  None. Other Findings:  None. IMPRESSION: No evidence of deep venous thrombosis in either lower extremity. Electronically Signed   By: Alcide Clever M.D.   On: 10/21/2019 10:24   DG Chest Port 1 View  Result Date: 10/19/2019 CLINICAL DATA:  Shortness of breath. COVID positive. EXAM: PORTABLE CHEST 1 VIEW COMPARISON:  October 19, 2019 (3:56 p.m.) FINDINGS: Persistent moderate to marked severity bilateral infiltrates are seen. Very mildly improved aeration of both lungs is noted. There is no evidence of a pleural effusion or pneumothorax. The cardiac silhouette is mildly enlarged and unchanged in size. The visualized skeletal structures are unremarkable. IMPRESSION: Persistent moderate to marked severity bilateral infiltrates. Electronically Signed   By: Aram Candela  M.D.   On: 10/19/2019 18:37   DG Chest Port 1 View  Result Date: 09/29/2019 CLINICAL DATA:  Cough and shortness of breath.  Diffuse pain. EXAM: PORTABLE CHEST 1 VIEW COMPARISON:  None. FINDINGS: Borderline cardiomegaly. Heterogeneous bilateral lung opacities. No pneumothorax or large pleural effusion. Degenerative change of both acromioclavicular joints. No evidence of acute osseous abnormality. IMPRESSION: Heterogeneous bilateral lung opacities, suspicious for multifocal pneumonia, including COVID-19. Electronically Signed   By: Narda Rutherford M.D.   On: 09/29/2019 15:05   ECHOCARDIOGRAM COMPLETE  Result Date: 10/20/2019    ECHOCARDIOGRAM REPORT   Patient Name:   Ricardo Lopez. Date of Exam: 10/20/2019 Medical Rec #:  [id number]          Height:       68.0 in Accession #:    [id number]         Weight:  350.0 lb Date of Birth:  [birth year]         BSA:          2.592 m Patient Age:    43 years           BP:           122/75 mmHg Patient Gender: M                  HR:           104 bpm. [facility] Procedure: 2D Echo, Cardiac Doppler and Color Doppler Indications:    Pulmonary hypertension 416.8 / I27.2  History:        Patient has no prior history of Echocardiogram examinations.                 Risk Factors:Hypertension. Admit with Pneumonia due to COVID-19                 virus, Severe obesity, Acute pulmonary embolism.  Sonographer:    Celesta Gentile RCS Referring Phys: [id number] OLADAPO ADEFESO IMPRESSIONS  1. Left ventricular ejection fraction, by estimation, is 55 to 60%. The left ventricle has normal function. The left ventricle has no regional wall motion abnormalities. There is mild left ventricular hypertrophy. Left ventricular diastolic parameters are indeterminate.  2. Right ventricular systolic function is mildly reduced. The right ventricular size is normal. Tricuspid regurgitation signal is inadequate for assessing PA pressure.  3. Left atrial size was moderately dilated.   4. The mitral valve is grossly normal. Trivial mitral valve regurgitation.  5. The aortic valve is tricuspid. Aortic valve regurgitation is not visualized.  6. The inferior vena cava is dilated in size with <50% respiratory variability, suggesting right atrial pressure of 15 mmHg. FINDINGS  Left Ventricle: Left ventricular ejection fraction, by estimation, is 55 to 60%. The left ventricle has normal function. The left ventricle has no regional wall motion abnormalities. The left ventricular internal cavity size was normal in size. There is  mild left ventricular hypertrophy. Left ventricular diastolic parameters are indeterminate. Right Ventricle: The right ventricular size is normal. No increase in right ventricular wall thickness. Right ventricular systolic function is mildly reduced. Tricuspid regurgitation signal is inadequate for assessing PA pressure. Left Atrium: Left atrial size was moderately dilated. Right Atrium: Right atrial size was normal in size. Pericardium: There is no evidence of pericardial effusion. Mitral Valve: The mitral valve is grossly normal. Trivial mitral valve regurgitation. Tricuspid Valve: The tricuspid valve is grossly normal. Tricuspid valve regurgitation is trivial. Aortic Valve: The aortic valve is tricuspid. Aortic valve regurgitation is not visualized. Pulmonic Valve: The pulmonic valve was grossly normal. Pulmonic valve regurgitation is trivial. Aorta: The aortic root is normal in size and structure. Venous: The inferior vena cava is dilated in size with less than 50% respiratory variability, suggesting right atrial pressure of 15 mmHg. IAS/Shunts: No atrial level shunt detected by color flow Doppler.  LEFT VENTRICLE PLAX 2D LVIDd:         5.54 cm LVIDs:         3.86 cm LV PW:         1.27 cm LV IVS:        1.37 cm LVOT diam:     2.50 cm LV SV:         126 LV SV Index:   49 LVOT Area:     4.91 cm  RIGHT VENTRICLE TAPSE (M-mode): 3.6 cm LEFT ATRIUM  Index       RIGHT  ATRIUM           Index LA diam:        4.80 cm  1.85 cm/m  RA Area:     20.30 cm LA Vol (A2C):   99.8 ml  38.50 ml/m RA Volume:   63.50 ml  24.49 ml/m LA Vol (A4C):   116.0 ml 44.74 ml/m LA Biplane Vol: 108.0 ml 41.66 ml/m  AORTIC VALVE LVOT Vmax:   126.00 cm/s LVOT Vmean:  76.600 cm/s LVOT VTI:    0.257 m  AORTA Ao Root diam: 3.70 cm MITRAL VALVE MV Area (PHT): 4.49 cm     SHUNTS MV Decel Time: 169 msec     Systemic VTI:  0.26 m MV E velocity: 141.00 cm/s  Systemic Diam: 2.50 cm Nona Dell MD Electronically signed by Nona Dell MD Signature Date/Time: 10/20/2019/3:49:54 PM    Final     Micro Results    Recent Results (from the past 240 hour(s))  Blood Culture (routine x 2)     Status: Abnormal (Preliminary result)   Collection Time: 10/19/19  5:13 PM   Specimen: BLOOD RIGHT FOREARM  Result Value Ref Range Status   Specimen Description   Final    BLOOD RIGHT FOREARM Performed at [facility], [address], [place], [place] [address]    Special Requests   Final    BOTTLES DRAWN AEROBIC AND ANAEROBIC Blood Culture adequate volume Performed at [facility], [address] [place], [place] [address]    Culture  Setup Time   Final    ANAEROBIC BOTTLE ONLY GRAM NEGATIVE RODS Gram Stain Report Called to,Read Back By and Verified With: H EVANS,RN @0504  10/21/19 MKELLY    Culture (A)  Final    ESCHERICHIA COLI SUSCEPTIBILITIES TO FOLLOW Performed at [facility], [address], [place], [place] [address]    Report Status PENDING  Incomplete  Blood Culture ID Panel (Reflexed)     Status: Abnormal   Collection Time: 10/19/19  5:13 PM  Result Value Ref Range Status   Enterococcus faecalis NOT DETECTED NOT DETECTED Final   Enterococcus Faecium NOT DETECTED NOT DETECTED Final   Listeria monocytogenes NOT DETECTED NOT DETECTED Final   Staphylococcus species NOT DETECTED NOT DETECTED Final   Staphylococcus aureus (BCID) NOT DETECTED NOT DETECTED Final   Staphylococcus  epidermidis NOT DETECTED NOT DETECTED Final   Staphylococcus lugdunensis NOT DETECTED NOT DETECTED Final   Streptococcus species NOT DETECTED NOT DETECTED Final   Streptococcus agalactiae NOT DETECTED NOT DETECTED Final   Streptococcus pneumoniae NOT DETECTED NOT DETECTED Final   Streptococcus pyogenes NOT DETECTED NOT DETECTED Final   A.calcoaceticus-baumannii NOT DETECTED NOT DETECTED Final   Bacteroides fragilis NOT DETECTED NOT DETECTED Final   Enterobacterales DETECTED (A) NOT DETECTED Final    Comment: Enterobacterales represent a large order of gram negative bacteria, not a single organism. CRITICAL RESULT CALLED TO, READ BACK BY AND VERIFIED WITH: Luz Brazen PharmD 10:10 10/21/19 ([email or web address])    Enterobacter cloacae complex NOT DETECTED NOT DETECTED Final   Escherichia coli DETECTED (A) NOT DETECTED Final    Comment: CRITICAL RESULT CALLED TO, READ BACK BY AND VERIFIED WITH: Luz Brazen PharmD 10:10 10/21/19 ([email or web address])    Klebsiella aerogenes NOT DETECTED NOT DETECTED Final   Klebsiella oxytoca NOT DETECTED NOT DETECTED Final   Klebsiella pneumoniae NOT DETECTED NOT DETECTED Final   Proteus species NOT DETECTED NOT DETECTED Final   Salmonella  species NOT DETECTED NOT DETECTED Final   Serratia marcescens NOT DETECTED NOT DETECTED Final   Haemophilus influenzae NOT DETECTED NOT DETECTED Final   Neisseria meningitidis NOT DETECTED NOT DETECTED Final   Pseudomonas aeruginosa NOT DETECTED NOT DETECTED Final   Stenotrophomonas maltophilia NOT DETECTED NOT DETECTED Final   Candida albicans NOT DETECTED NOT DETECTED Final   Candida auris NOT DETECTED NOT DETECTED Final   Candida glabrata NOT DETECTED NOT DETECTED Final   Candida krusei NOT DETECTED NOT DETECTED Final   Candida parapsilosis NOT DETECTED NOT DETECTED Final   Candida tropicalis NOT DETECTED NOT DETECTED Final   Cryptococcus neoformans/gattii NOT DETECTED NOT DETECTED Final   CTX-M ESBL NOT DETECTED NOT DETECTED Final    Carbapenem resistance IMP NOT DETECTED NOT DETECTED Final   Carbapenem resistance KPC NOT DETECTED NOT DETECTED Final   Carbapenem resistance NDM NOT DETECTED NOT DETECTED Final   Carbapenem resist OXA 48 LIKE NOT DETECTED NOT DETECTED Final   Carbapenem resistance VIM NOT DETECTED NOT DETECTED Final    Comment: Performed at [facility], [address], [place], [place] [address]  Blood Culture (routine x 2)     Status: None (Preliminary result)   Collection Time: 10/19/19  5:38 PM   Specimen: BLOOD  Result Value Ref Range Status   Specimen Description BLOOD LEFT ANTECUBITAL  Final   Special Requests   Final    BOTTLES DRAWN AEROBIC AND ANAEROBIC Blood Culture adequate volume   Culture   Final    NO GROWTH 3 DAYS Performed at [facility], [address], [place], [place] [address]    Report Status PENDING  Incomplete       Today   Subjective    Ricardo Lopez today has no new complaints -Dyspnea and hypoxia improved significantly Denies further rectal bleeding No fever  Or chills -Oxygen requirement down to 2 L/min        Patient has been seen and examined prior to discharge   Objective   Blood pressure (!) 142/82, pulse (!) 106, temperature 99 F (37.2 C), temperature source Oral, resp. rate 18, height  (1.727 m), weight (!) 158.8 kg, SpO2 91 %.   Intake/Output Summary (Last 24 hours) at 10/22/2019 1444 Last data filed at 10/22/2019 1300 Gross per 24 hour  Intake 1750.5 ml  Output --  Net 1750.5 ml    Exam Gen:- Awake Alert, morbidly obese , HEENT:- [place].AT, No sclera icterus Nose- 2 L/min Neck-Supple Neck,No JVD,.  Lungs-improving air movement, no wheezing CV- S1, S2 normal, regular  Abd-  +ve B.Sounds, Abd Soft, No tenderness, increased truncal adiposity Extremity/Skin:- No significant edema, chronic venous stasis changes noted, pedal pulses present  Psych-affect is appropriate, oriented x3 Neuro-generalized weakness, no new focal deficits, no  tremors   Data Review   CBC w Diff:  Lab Results  Component Value Date   WBC 11.6 (H) 10/22/2019   HGB 8.9 (L) 10/22/2019   HCT 29.9 (L) 10/22/2019   PLT 191 10/22/2019   LYMPHOPCT 11 10/22/2019   MONOPCT 11 10/22/2019   EOSPCT 0 10/22/2019   BASOPCT 0 10/22/2019    CMP:  Lab Results  Component Value Date   NA 136 10/22/2019   K 4.7 10/22/2019   CL 99 10/22/2019   CO2 28 10/22/2019   BUN 18 10/22/2019   CREATININE 0.95 10/22/2019   CREATININE 1.30 02/28/2015   PROT 7.0 10/22/2019   ALBUMIN 2.7 (L) 10/22/2019   BILITOT 0.4 10/22/2019  ALKPHOS 77 10/22/2019   AST 51 (H) 10/22/2019   ALT 64 (H) 10/22/2019  .   Total Discharge time is about 33 minutes  Shon Hale M.D on 10/22/2019 at 2:44 PM  Go to [email or web address] -  for contact info  [facility] - Office  [phone]

## 2019-10-22 NOTE — Discharge Instructions (Signed)
1)Avoid ibuprofen/Advil/Aleve/Motrin/Goody Powders/Naproxen/BC powders/Meloxicam/Diclofenac/Indomethacin and other Nonsteroidal anti-inflammatory medications as these will make you more likely to bleed and can cause stomach ulcers, can also cause Kidney problems.   2)you need oxygen at home at 2 L via nasal cannula continuously while awake and while asleep--- smoking or having open fires around oxygen can cause fire, significant injury and death  3) please use Anusol HC suppository as prescribed to treat your hemorrhoids to prevent further rectal bleeding  4) You are strongly advised to  to isolate/quarantine for at least 21 days from the date of your diagnoses with COVID-19 infection--please always wear a mask if you have to go outside the house  5)Please repeat CBC and CMP with your primary care physician within the next 10 to 14 days - call to set this appointment up  6) you have iron deficiency anemia--you need to follow-up with gastroenterologist as outpatient for colonoscopy after resolution of feel respiratory problems from Covid

## 2019-10-22 NOTE — Plan of Care (Signed)

## 2019-10-23 LAB — CULTURE, BLOOD (ROUTINE X 2): Special Requests: ADEQUATE

## 2019-10-24 LAB — CULTURE, BLOOD (ROUTINE X 2)
Culture: NO GROWTH
Special Requests: ADEQUATE

## 2019-10-30 ENCOUNTER — Inpatient Hospital Stay (HOSPITAL_COMMUNITY)
Admission: EM | Admit: 2019-10-30 | Discharge: 2019-11-03 | DRG: 872 | Disposition: A | Discharge status: Home Health | Payer: 59 | Attending: Internal Medicine | Admitting: Internal Medicine

## 2019-10-30 ENCOUNTER — Encounter (HOSPITAL_COMMUNITY): Payer: Self-pay | Admitting: Emergency Medicine

## 2019-10-30 ENCOUNTER — Emergency Department (HOSPITAL_COMMUNITY): Payer: 59

## 2019-10-30 ENCOUNTER — Other Ambulatory Visit: Payer: Self-pay

## 2019-10-30 DIAGNOSIS — L0231 Cutaneous abscess of buttock: Secondary | ICD-10-CM | POA: Diagnosis present

## 2019-10-30 DIAGNOSIS — Z1621 Resistance to vancomycin: Secondary | ICD-10-CM | POA: Diagnosis present

## 2019-10-30 DIAGNOSIS — B952 Enterococcus as the cause of diseases classified elsewhere: Secondary | ICD-10-CM | POA: Diagnosis present

## 2019-10-30 DIAGNOSIS — L03317 Cellulitis of buttock: Secondary | ICD-10-CM | POA: Diagnosis present

## 2019-10-30 DIAGNOSIS — A4151 Sepsis due to Escherichia coli [E. coli]: Principal | ICD-10-CM | POA: Diagnosis present

## 2019-10-30 DIAGNOSIS — Z6841 Body Mass Index (BMI) 40.0 and over, adult: Secondary | ICD-10-CM

## 2019-10-30 DIAGNOSIS — A419 Sepsis, unspecified organism: Secondary | ICD-10-CM

## 2019-10-30 DIAGNOSIS — J9601 Acute respiratory failure with hypoxia: Secondary | ICD-10-CM | POA: Diagnosis not present

## 2019-10-30 DIAGNOSIS — D509 Iron deficiency anemia, unspecified: Secondary | ICD-10-CM | POA: Diagnosis present

## 2019-10-30 DIAGNOSIS — E1165 Type 2 diabetes mellitus with hyperglycemia: Secondary | ICD-10-CM | POA: Diagnosis present

## 2019-10-30 DIAGNOSIS — I10 Essential (primary) hypertension: Secondary | ICD-10-CM | POA: Diagnosis present

## 2019-10-30 DIAGNOSIS — U099 Post covid-19 condition, unspecified: Secondary | ICD-10-CM | POA: Diagnosis present

## 2019-10-30 DIAGNOSIS — E662 Morbid (severe) obesity with alveolar hypoventilation: Secondary | ICD-10-CM | POA: Diagnosis present

## 2019-10-30 DIAGNOSIS — J9611 Chronic respiratory failure with hypoxia: Secondary | ICD-10-CM

## 2019-10-30 DIAGNOSIS — D649 Anemia, unspecified: Secondary | ICD-10-CM | POA: Diagnosis present

## 2019-10-30 DIAGNOSIS — K611 Rectal abscess: Secondary | ICD-10-CM | POA: Diagnosis present

## 2019-10-30 DIAGNOSIS — L089 Local infection of the skin and subcutaneous tissue, unspecified: Secondary | ICD-10-CM

## 2019-10-30 LAB — CBC
HCT: 28.7 % — ABNORMAL LOW (ref 39.0–52.0)
Hemoglobin: 8.8 g/dL — ABNORMAL LOW (ref 13.0–17.0)
MCH: 20.4 pg — ABNORMAL LOW (ref 26.0–34.0)
MCHC: 30.7 g/dL (ref 30.0–36.0)
MCV: 66.4 fL — ABNORMAL LOW (ref 80.0–100.0)
Platelets: 549 10*3/uL — ABNORMAL HIGH (ref 150–400)
RBC: 4.32 MIL/uL (ref 4.22–5.81)
RDW: 16.8 % — ABNORMAL HIGH (ref 11.5–15.5)
WBC: 16.9 10*3/uL — ABNORMAL HIGH (ref 4.0–10.5)
nRBC: 0 % (ref 0.0–0.2)

## 2019-10-30 LAB — COMPREHENSIVE METABOLIC PANEL
ALT: 57 U/L — ABNORMAL HIGH (ref 0–44)
AST: 36 U/L (ref 15–41)
Albumin: 2.3 g/dL — ABNORMAL LOW (ref 3.5–5.0)
Alkaline Phosphatase: 90 U/L (ref 38–126)
Anion gap: 10 (ref 5–15)
BUN: 9 mg/dL (ref 6–20)
CO2: 28 mmol/L (ref 22–32)
Calcium: 8.4 mg/dL — ABNORMAL LOW (ref 8.9–10.3)
Chloride: 100 mmol/L (ref 98–111)
Creatinine, Ser: 0.94 mg/dL (ref 0.61–1.24)
GFR, Estimated: >60 mL/min (ref >60)
Glucose, Bld: 140 mg/dL — ABNORMAL HIGH (ref 70–99)
Potassium: 3.2 mmol/L — ABNORMAL LOW (ref 3.5–5.1)
Sodium: 138 mmol/L (ref 135–145)
Total Bilirubin: 0.6 mg/dL (ref 0.3–1.2)
Total Protein: 7.6 g/dL (ref 6.5–8.1)

## 2019-10-30 LAB — LACTIC ACID, PLASMA: Lactic Acid, Venous: 1.6 mmol/L (ref 0.5–1.9)

## 2019-10-30 MED ORDER — SENNOSIDES-DOCUSATE SODIUM 8.6-50 MG PO TABS
2.0000 | ORAL_TABLET | Freq: Every day | ORAL | Status: DC
  Administered 2019-10-31: 2 via ORAL
  Filled 2019-10-30 (×3): qty 2

## 2019-10-30 MED ORDER — VANCOMYCIN HCL 1250 MG/250ML IV SOLN
1250.0000 mg | Freq: Two times a day (BID) | INTRAVENOUS | Status: DC
  Administered 2019-10-30 – 2019-11-03 (×8): 1250 mg via INTRAVENOUS
  Filled 2019-10-30 (×9): qty 250

## 2019-10-30 MED ORDER — HYDROMORPHONE HCL 1 MG/ML IJ SOLN
1.0000 mg | Freq: Once | INTRAMUSCULAR | Status: AC
  Administered 2019-10-30: 1 mg via INTRAVENOUS
  Filled 2019-10-30: qty 1

## 2019-10-30 MED ORDER — VANCOMYCIN HCL 2000 MG/400ML IV SOLN
2000.0000 mg | Freq: Once | INTRAVENOUS | Status: AC
  Administered 2019-10-30: 2000 mg via INTRAVENOUS
  Filled 2019-10-30: qty 400

## 2019-10-30 MED ORDER — VANCOMYCIN HCL 2000 MG/400ML IV SOLN: 2000.0000 mg | Freq: Two times a day (BID) | INTRAVENOUS | Status: DC

## 2019-10-30 MED ORDER — PIPERACILLIN-TAZOBACTAM 3.375 G IVPB: 3.3750 g | Freq: Three times a day (TID) | INTRAVENOUS | Status: DC

## 2019-10-30 MED ORDER — HYDROMORPHONE HCL 1 MG/ML IJ SOLN
1.0000 mg | INTRAMUSCULAR | Status: DC | PRN
  Administered 2019-11-03: 1 mg via INTRAVENOUS
  Filled 2019-10-30: qty 1

## 2019-10-30 MED ORDER — ZINC SULFATE 220 (50 ZN) MG PO CAPS
220.0000 mg | ORAL_CAPSULE | Freq: Every day | ORAL | Status: DC
  Administered 2019-10-31 – 2019-11-03 (×4): 220 mg via ORAL
  Filled 2019-10-30 (×4): qty 1

## 2019-10-30 MED ORDER — CLINDAMYCIN PHOSPHATE 600 MG/50ML IV SOLN
600.0000 mg | Freq: Three times a day (TID) | INTRAVENOUS | Status: DC
  Administered 2019-10-30 – 2019-11-01 (×6): 600 mg via INTRAVENOUS
  Filled 2019-10-30 (×6): qty 50

## 2019-10-30 MED ORDER — SODIUM CHLORIDE 0.9% FLUSH
3.0000 mL | Freq: Two times a day (BID) | INTRAVENOUS | Status: DC
  Administered 2019-10-31 – 2019-11-03 (×6): 3 mL via INTRAVENOUS

## 2019-10-30 MED ORDER — PIPERACILLIN-TAZOBACTAM 3.375 G IVPB
3.3750 g | Freq: Three times a day (TID) | INTRAVENOUS | Status: DC
  Administered 2019-10-30 – 2019-11-03 (×12): 3.375 g via INTRAVENOUS
  Filled 2019-10-30 (×11): qty 50

## 2019-10-30 MED ORDER — POLYETHYLENE GLYCOL 3350 17 G PO PACK
17.0000 g | PACK | Freq: Every day | ORAL | Status: DC
  Administered 2019-11-01: 17 g via ORAL
  Filled 2019-10-30 (×3): qty 1

## 2019-10-30 MED ORDER — VANCOMYCIN HCL IN DEXTROSE 1-5 GM/200ML-% IV SOLN: 1000.0000 mg | Freq: Once | INTRAVENOUS | Status: DC

## 2019-10-30 MED ORDER — ONDANSETRON HCL 4 MG PO TABS
4.0000 mg | ORAL_TABLET | Freq: Four times a day (QID) | ORAL | Status: DC | PRN
  Administered 2019-11-01: 4 mg via ORAL
  Filled 2019-10-30: qty 1

## 2019-10-30 MED ORDER — ACETAMINOPHEN 650 MG RE SUPP: 650.0000 mg | Freq: Four times a day (QID) | RECTAL | Status: DC | PRN

## 2019-10-30 MED ORDER — SODIUM CHLORIDE 0.9% FLUSH: 3.0000 mL | INTRAVENOUS | Status: DC | PRN

## 2019-10-30 MED ORDER — ADULT MULTIVITAMIN W/MINERALS CH
1.0000 | ORAL_TABLET | Freq: Every day | ORAL | Status: DC
  Administered 2019-10-31 – 2019-11-03 (×4): 1 via ORAL
  Filled 2019-10-30 (×5): qty 1

## 2019-10-30 MED ORDER — HYDROCOD POLST-CPM POLST ER 10-8 MG/5ML PO SUER: 5.0000 mL | Freq: Two times a day (BID) | ORAL | Status: DC | PRN

## 2019-10-30 MED ORDER — PIPERACILLIN-TAZOBACTAM 3.375 G IVPB 30 MIN
3.3750 g | Freq: Once | INTRAVENOUS | Status: AC
  Administered 2019-10-30: 3.375 g via INTRAVENOUS
  Filled 2019-10-30: qty 50

## 2019-10-30 MED ORDER — ASCORBIC ACID 500 MG PO TABS
500.0000 mg | ORAL_TABLET | Freq: Every day | ORAL | Status: DC
  Administered 2019-10-31 – 2019-11-03 (×4): 500 mg via ORAL
  Filled 2019-10-30 (×4): qty 1

## 2019-10-30 MED ORDER — ALBUTEROL SULFATE HFA 108 (90 BASE) MCG/ACT IN AERS
2.0000 | INHALATION_SPRAY | RESPIRATORY_TRACT | Status: DC | PRN
  Administered 2019-11-02: 2 via RESPIRATORY_TRACT
  Filled 2019-10-30: qty 6.7

## 2019-10-30 MED ORDER — PANTOPRAZOLE SODIUM 40 MG PO TBEC
40.0000 mg | DELAYED_RELEASE_TABLET | Freq: Every day | ORAL | Status: DC
  Administered 2019-10-31 – 2019-11-03 (×4): 40 mg via ORAL
  Filled 2019-10-30 (×4): qty 1

## 2019-10-30 MED ORDER — OXYCODONE HCL 5 MG PO TABS
10.0000 mg | ORAL_TABLET | ORAL | Status: DC | PRN
  Administered 2019-10-30 – 2019-10-31 (×4): 10 mg via ORAL
  Administered 2019-11-01 – 2019-11-02 (×4): 5 mg via ORAL
  Administered 2019-11-02: 10 mg via ORAL
  Filled 2019-10-30 (×9): qty 2

## 2019-10-30 MED ORDER — ACETAMINOPHEN 325 MG PO TABS: 650.0000 mg | ORAL_TABLET | Freq: Four times a day (QID) | ORAL | Status: DC | PRN

## 2019-10-30 MED ORDER — GUAIFENESIN-DM 100-10 MG/5ML PO SYRP: 10.0000 mL | ORAL_SOLUTION | ORAL | Status: DC | PRN

## 2019-10-30 MED ORDER — ONDANSETRON HCL 4 MG/2ML IJ SOLN: 4.0000 mg | Freq: Four times a day (QID) | INTRAMUSCULAR | Status: DC | PRN

## 2019-10-30 MED ORDER — SODIUM CHLORIDE 0.9 % IV SOLN: 250.0000 mL | INTRAVENOUS | Status: DC | PRN

## 2019-10-30 MED ORDER — ENOXAPARIN SODIUM 80 MG/0.8ML ~~LOC~~ SOLN
80.0000 mg | SUBCUTANEOUS | Status: DC
  Administered 2019-10-30 – 2019-11-02 (×4): 80 mg via SUBCUTANEOUS
  Filled 2019-10-30 (×4): qty 0.8

## 2019-10-30 MED ORDER — SODIUM CHLORIDE 0.9% FLUSH
3.0000 mL | Freq: Two times a day (BID) | INTRAVENOUS | Status: DC
  Administered 2019-10-31 – 2019-11-03 (×6): 3 mL via INTRAVENOUS

## 2019-10-30 MED ORDER — IOHEXOL 300 MG/ML  SOLN
100.0000 mL | Freq: Once | INTRAMUSCULAR | Status: AC | PRN
  Administered 2019-10-30: 100 mL via INTRAVENOUS

## 2019-10-30 MED ORDER — ACETAMINOPHEN 325 MG PO TABS
650.0000 mg | ORAL_TABLET | Freq: Four times a day (QID) | ORAL | Status: DC | PRN
  Administered 2019-11-02: 650 mg via ORAL
  Filled 2019-10-30: qty 2

## 2019-10-30 NOTE — Consult Note (Signed)
Reason for Consult: Perirectal abscess, right buttock Referring Physician: Dr. Domenica Fail. is an 44 y.o. male.  HPI: Patient is a 44 year old morbidly obese black male who presented to [facility] with worsening pain and drainage from his right buttock.  He has never had this happen before.  During his evaluation in the emergency room, he was noted to have a leukocytosis.  A CT scan was performed which revealed bilateral gluteal fold cellulitis.  No drainable abscess noted.  Patient is being admitted by the hospitalist for further evaluation and IV antibiotics.  Patient states he started draining purulent fluid 2 days ago.  Past Medical History:  Diagnosis Date  . Hypertension   . Obesity     History reviewed. No pertinent surgical history.  History reviewed. No pertinent family history.  Social History:  reports that he has never smoked. He has never used smokeless tobacco. He reports current alcohol use. He reports that he does not use drugs.  Allergies: No Known Allergies  Medications: I have reviewed the patient's current medications.  Results for orders placed or performed during the hospital encounter of 10/30/19 (from the past 48 hour(s))  CBC     Status: Abnormal   Collection Time: 10/30/19  4:06 AM  Result Value Ref Range   WBC 16.9 (H) 4.0 - 10.5 K/uL   RBC 4.32 4.22 - 5.81 MIL/uL   Hemoglobin 8.8 (L) 13.0 - 17.0 g/dL    Comment: Reticulocyte Hemoglobin testing may be clinically indicated, consider ordering this additional test [id number]    HCT 28.7 (L) 39 - 52 %   MCV 66.4 (L) 80.0 - 100.0 fL   MCH 20.4 (L) 26.0 - 34.0 pg   MCHC 30.7 30.0 - 36.0 g/dL   RDW 81.0 (H) 17.5 - 10.2 %   Platelets 549 (H) 150 - 400 K/uL   nRBC 0.0 0.0 - 0.2 %    Comment: Performed at [facility], [address], [place], [place] [address]  Comprehensive metabolic panel     Status: Abnormal   Collection Time: 10/30/19  4:06 AM  Result Value Ref Range   Sodium 138  135 - 145 mmol/L   Potassium 3.2 (L) 3.5 - 5.1 mmol/L   Chloride 100 98 - 111 mmol/L   CO2 28 22 - 32 mmol/L   Glucose, Bld 140 (H) 70 - 99 mg/dL   BUN 9 6 - 20 mg/dL   Creatinine, Ser 7.82 0.61 - 1.24 mg/dL   Calcium 8.4 (L) 8.9 - 10.3 mg/dL   Total Protein 7.6 6.5 - 8.1 g/dL   Albumin 2.3 (L) 3.5 - 5.0 g/dL   AST 36 15 - 41 U/L   ALT 57 (H) 0 - 44 U/L   Alkaline Phosphatase 90 38 - 126 U/L   Total Bilirubin 0.6 0.3 - 1.2 mg/dL   GFR, Estimated >42 >35 mL/min   Anion gap 10 5 - 15    Comment: Performed at [facility], [address], [place], [place] [address]  Lactic acid, plasma     Status: None   Collection Time: 10/30/19  4:06 AM  Result Value Ref Range   Lactic Acid, Venous 1.6 0.5 - 1.9 mmol/L    Comment: Performed at [facility], [address] [place], [place] [address]    CT PELVIS W CONTRAST  Result Date: 10/30/2019 CLINICAL DATA:  Draining wound on buttocks after trauma several days ago. EXAM: CT PELVIS WITH CONTRAST TECHNIQUE: Multidetector CT imaging  of the pelvis was performed using the standard protocol following the bolus administration of intravenous contrast. CONTRAST:  OMNIPAQUE IOHEXOL 300 MG/ML  SOLN COMPARISON:  None. FINDINGS: Mild limitations secondary to patient body habitus. Urinary Tract:  Normal urinary bladder.  No distal hydroureter. Bowel: Suggestion of pelvic floor laxity/descent with possible rectal prolapse, including on 47/4. Otherwise normal pelvic bowel loops. Vascular/Lymphatic: No pelvic aneurysm. Pelvic sidewall adenopathy, most significant in the right external iliac station of 1.6 cm on 25/4. Right inguinal node measures 1.5 cm on 44/4. Reproductive:  Normal prostate. Other: No significant free fluid. Tiny fat containing right inguinal hernia. Edema is identified about the gluteal crease bilaterally, including on 47/4. There are areas of subcutaneous gas on the right with skin irregularity including on 59/4. Minimal left gas on 42/4. no  well-defined fluid collection to suggest drainable abscess. Musculoskeletal: No acute osseous abnormality. IMPRESSION: 1. Right greater than left edema and subcutaneous gas about the gluteal creases. Likely indicative of cellulitis. If perianal/perirectal fistula is a concern, the test of choice is high-resolution pre and post-contrast pelvic MRI. Rectal prolapse 2. Suggestion of pelvic floor descent with possible. 3. Pelvic adenopathy, most likely reactive. This could be re-evaluated with pelvic CT follow-up at 3-6 months. 4. Mild degradation secondary to patient body habitus. Electronically Signed   By: Jeronimo Greaves M.D.   On: 10/30/2019 09:34    ROS:  Pertinent items are noted in HPI.  Blood pressure (!) 148/60, pulse (!) 109, temperature (!) 97.2 F (36.2 C), temperature source Oral, resp. rate 16, height 5\' 8"  (1.727 m), weight (!) 181.4 kg, SpO2 98 %. Physical Exam: Pleasant black male who was lying on his stomach in no acute distress. Head is normocephalic, atraumatic Lungs clear to auscultation with good breath sounds bilaterally Buttock examination reveals a large 6 to 8 cm area of necrotic skin loss down to the subcutaneous tissue with purulent drainage emanating from it.  A small amount of the necrotic tissue was manually removed.  No stool was present.  Examination was limited secondary to pain and body habitus.  Assessment/Plan: Impression: Right perirectal, gluteal fold abscess, actively draining at this time. Plan: No need for acute surgical invention at this time as the wound has broken open.  Agree with work-up for possible perianal fistula.  Agree with antibiotics.  Wound care orders have been placed.  Will follow with you.  10/30/2019, 2:01 PM

## 2019-10-30 NOTE — ED Notes (Signed)
Wound care complete per Dr. Lovell Sheehan direction.  Wound irrigated/ wet to dry dressing applied, abd pad applied on top with foam tape.  Pt tolerated well.

## 2019-10-30 NOTE — ED Notes (Signed)
CRITICAL VALUE ALERT  Critical Value:  Unable to do anaerobic culture on this. Able to do aerobic. If he wants anaerobic separate, there needs to be a separate swab.   Date & Time Notied:   10/30/2019 1400   Provider Notified: Dr. Marisa Severin  Orders Received/Actions taken: None yet

## 2019-10-30 NOTE — Progress Notes (Signed)
Pharmacy Antibiotic Note  Ricardo Lopez. is a 44 y.o. male admitted on 10/30/2019 with cellulitis.  Pharmacy has been consulted for vancomycin and Zosyn dosing. Patient is also on clindamycin.  Plan: Start Zosyn 3.375g IV q8h Change vancomycin to 1.25g IV q12h (pt w/history of AKI and also on Zosyn) Goal vancomycin trough range:   10-15 mcg/mL Pharmacy will continue to monitor renal function, vancomycin troughs as clinically appropriate,  cultures and patient progress.    Height: 5\' 8"  (172.7 cm) Weight: (!) 181.4 kg (400 lb) IBW/kg (Calculated) : 68.4  Temp (24hrs), Avg:98.7 F (37.1 C), Min:98.2 F (36.8 C), Max:99.1 F (37.3 C)  Recent Labs  Lab 10/30/19 0406  WBC 16.9*  CREATININE 0.94  LATICACIDVEN 1.6    Estimated Creatinine Clearance: 162.8 mL/min (by C-G formula based on SCr of 0.94 mg/dL).    No Known Allergies  Antimicrobials this admission: vancomycin 10/9 >>   Zosyn 10/9>>  clindamycin 10/9>>   Microbiology results: 9/28 BC 2x: 1 of 2 positive for E. Coli, pan- sensitive 10/9 Wound cx: pending     Thank you for allowing pharmacy to be a part of this patient's care.  12/9 10/30/2019 12:51 PM

## 2019-10-30 NOTE — ED Notes (Signed)
Changed patients dressing, prevalent drainage odiferous.  Pt tolerated well.

## 2019-10-30 NOTE — Progress Notes (Signed)
Pharmacy Antibiotic Note  Ricardo Lopez. is a 44 y.o. male admitted on 10/30/2019 with wound infection .  Pharmacy has been consulted for Vancomycin  dosing.  Plan: Vancomycin 2 g IV q12h  Height: 5\' 8"  (172.7 cm) Weight: (!) 181.4 kg (400 lb) IBW/kg (Calculated) : 68.4  Temp (24hrs), Avg:98.7 F (37.1 C), Min:98.7 F (37.1 C), Max:98.7 F (37.1 C)  Recent Labs  Lab 10/30/19 0406  WBC 16.9*  CREATININE 0.94  LATICACIDVEN 1.6    Estimated Creatinine Clearance: 162.8 mL/min (by C-G formula based on SCr of 0.94 mg/dL).    No Known Allergies  12/30/19 10/30/2019 7:40 AM

## 2019-10-30 NOTE — ED Provider Notes (Signed)
[facility]-EMERGENCY DEPT [facility] Emergency Department Provider Note MRN:  [id number]  Arrival date & time: 10/30/19     Chief Complaint   Wound Check   History of Present Illness   Ricardo Lopez. is a 44 y.o. year-old male with a history of hypertension, obesity presenting to the ED with chief complaint of wound check.  Sore to the buttocks for the past several days, becoming more painful.  Thinks he banged it on something today yesterday and since then it has been oozing.  Foul odor.  Denies fever.  Pain is moderate, constant, worse with motion and palpation.  Review of Systems  A complete 10 system review of systems was obtained and all systems are negative except as noted in the HPI and PMH.   Patient's Health History    Past Medical History:  Diagnosis Date  . Hypertension   . Obesity     History reviewed. No pertinent surgical history.  History reviewed. No pertinent family history.  Social History   Socioeconomic History  . Marital status: Single    Spouse name: Not on file  . Number of children: Not on file  . Years of education: Not on file  . Highest education level: Not on file  Occupational History  . Not on file  Tobacco Use  . Smoking status: Never Smoker  . Smokeless tobacco: Never Used  Vaping Use  . Vaping Use: Never used  Substance and Sexual Activity  . Alcohol use: Yes    Comment: occasional  . Drug use: No  . Sexual activity: Never  Other Topics Concern  . Not on file  Social History Narrative  . Not on file   Social Determinants of Health   Financial Resource Strain:   . Difficulty of Paying Living Expenses: Not on file  Food Insecurity:   . Worried About Programme researcher, broadcasting/film/video in the Last Year: Not on file  . Ran Out of Food in the Last Year: Not on file  Transportation Needs:   . Lack of Transportation (Medical): Not on file  . Lack of Transportation (Non-Medical): Not on file  Physical Activity:   . Days of Exercise per Week:  Not on file  . Minutes of Exercise per Session: Not on file  Stress:   . Feeling of Stress : Not on file  Social Connections:   . Frequency of Communication with Friends and Family: Not on file  . Frequency of Social Gatherings with Friends and Family: Not on file  . Attends Religious Services: Not on file  . Active Member of Clubs or Organizations: Not on file  . Attends Banker Meetings: Not on file  . Marital Status: Not on file  Intimate Partner Violence:   . Fear of Current or Ex-Partner: Not on file  . Emotionally Abused: Not on file  . Physically Abused: Not on file  . Sexually Abused: Not on file     Physical Exam   Vitals:   10/30/19 0500 10/30/19 0530  BP: 124/78 131/81  Pulse: (!) 107 (!) 114  Resp: 17 18  Temp: 98.7 F (37.1 C)   SpO2: 100% 98%    CONSTITUTIONAL: Well-appearing, NAD, obese NEURO:  Alert and oriented x 3, no focal deficits EYES:  eyes equal and reactive ENT/NECK:  no LAD, no JVD CARDIO: Regular rate, well-perfused, normal S1 and S2 PULM:  CTAB no wheezing or rhonchi GI/GU:  normal bowel sounds, non-distended, non-tender MSK/SPINE:  No gross deformities,  no edema SKIN: 7 cm circular wound to the left sacral region, foul odor, purulent bloody discharge PSYCH:  Appropriate speech and behavior  *Additional and/or pertinent findings included in MDM below  Diagnostic and Interventional Summary    EKG Interpretation  Date/Time:    Ventricular Rate:    PR Interval:    QRS Duration:   QT Interval:    QTC Calculation:   R Axis:     Text Interpretation:        Labs Reviewed  CBC - Abnormal; Notable for the following components:      Result Value   WBC 16.9 (*)    Hemoglobin 8.8 (*)    HCT 28.7 (*)    MCV 66.4 (*)    MCH 20.4 (*)    RDW 16.8 (*)    Platelets 549 (*)    All other components within normal limits  COMPREHENSIVE METABOLIC PANEL - Abnormal; Notable for the following components:   Potassium 3.2 (*)     Glucose, Bld 140 (*)    Calcium 8.4 (*)    Albumin 2.3 (*)    ALT 57 (*)    All other components within normal limits  AEROBIC/ANAEROBIC CULTURE (SURGICAL/DEEP WOUND)  LACTIC ACID, PLASMA    CT PELVIS W CONTRAST    (Results Pending)    Medications  HYDROmorphone (DILAUDID) injection 1 mg (1 mg Intravenous Given 10/30/19 0422)     Procedures  /  Critical Care Procedures  ED Course and Medical Decision Making  I have reviewed the triage vital signs, the nursing notes, and pertinent available records from the EMR.  Listed above are laboratory and imaging tests that I personally ordered, reviewed, and interpreted and then considered in my medical decision making (see below for details).  Concern for wound infection, CT to evaluate depth and severity.     Labs reveal leukocytosis, patient remains mildly tachycardic, awaiting CT imaging.  Anticipating need for admission for IV antibiotics and wound care.  Signed out to oncoming provider at shift change.  Elmer Sow. Pilar Plate, MD [facility] Emergency Medicine [facility] [email or web address]  Final Clinical Impressions(s) / ED Diagnoses     ICD-10-CM   1. Wound infection  T14.8XXA    L08.9     ED Discharge Orders    None       Discharge Instructions Discussed with and Provided to Patient:   Discharge Instructions   None       Sabas Sous, MD 10/30/19 [phone]

## 2019-10-30 NOTE — H&P (Addendum)
Patient Demographics:    Ricardo Lopez, is a 44 y.o. male  MRN: [id number]   DOB - Jan 12, 1976  Admit Date - 10/30/2019  Outpatient Primary MD for the patient is Susy Frizzle, MD   Assessment & Plan:    Principal Problem:   Abscess and cellulitis of gluteal region Active Problems:   Acute respiratory failure with hypoxia due to Covid    Severe obesity (BMI >= 40) (HCC)   Microcytic anemia    1)Lt Buttock cellulitis and abscess----CT suggestive of gas in soft tissue -Discussed with general surgeon Dr. Arnoldo Morale -MRI of the pelvic area when MRI becomes available on Monday -IV vancomycin, Zosyn and clindamycin as per  Cellulitis order set -wound pending -Please see photos in epic -Patient met SIRs criteria on admission with WBC of 16.9, tachycardia and left buttock abscess -Lactic Acid is 1.6, no evidence of endorgan dysfunction--patient's hypoxia is at baseline,   2)acute hypoxic respiratory failure secondary to COVID-19 infection----previously completed remdesivir -Currently requiring 2 L of oxygen via nasal cannula -Recent -CTA chest with poor opacification of contrast, but no definite PE -Lower extremity venous Dopplerswithout DVT -Initially diagnosed with COVID-19 infection on 09/29/2019--patient is outside the window for further isolation, currently asymptomatic from a COVID-19 infection standpoint -Unvaccinated against Covid  3)DM2-A1c 7.1 reflecting uncontrolled DM  -Use Novolog/Humalog Sliding scale insulin with Accu-Cheks/Fingersticks as ordered   4) iron deficiency anemia--- serum iron is 22 with saturation of 7, ferritin is probably not low due to COVID-19 infection =--Patient will need endoluminal evaluation as outpatient after resolution of his acute respiratory symptoms -Hgbcurrently stable  around 9  -Outpatient follow-up with GI for possible colonoscopy advised  5)Morbid Obesity- -after resolution of acute respiratory symptoms patient will be advised to comply with low calorie diet, portion control and increase physical activity discussed with patient -Body mass index is 53.22 kg/m  6) recent E. coli bacteremia----was non-ESBL treated with appropriate antibiotics  Disposition/Need for in-Hospital Stay- patient unable to be discharged at this time due to --left buttock abscess requiring IV antibiotics and possible surgical intervention*  Status is: Inpatient  Remains inpatient appropriate because:left buttock abscess requiring IV antibiotics and possible surgical intervention*   Dispo: The patient is from: Home              Anticipated d/c is to: Home              Anticipated d/c date is: > 3 days              Patient currently is not medically stable to d/c. Barriers: Not Clinically Stable- left buttock abscess requiring IV antibiotics and possible surgical intervention*  With History of - Reviewed by me  Past Medical History:  Diagnosis Date  . Hypertension   . Obesity       History reviewed. No pertinent surgical history.    Chief Complaint  Patient presents with  . Wound Check  HPI:    Ricardo Lopez  is a 44 y.o. male past medical history relevant for morbid obesity, HTN and recent COVID-19 infection with persistent hypoxia presents to the ED with discomfort/pain and foul-smelling drainage from left buttock area --No fevers no chills no vomiting or diarrhea -Additional history obtained from patient's wife at bedside - -In the ED CT abdomen and pelvis consistent with left buttock abscess/cellulitis with suggestion of gas in the soft tissue -Patient met SIRs criteria on admission with WBC of 16.9, tachycardia and left buttock abscess -Lactic Acid is 1.6, -Creatinine 0.94 -Potassium is 3.2, hemoglobin 8.8 -Patient was seen in the ED by general  surgeon, antibiotics initiated  -Patient will be admitted for---left buttock abscess requiring IV antibiotics and possible surgical intervention*     Review of systems:    In addition to the HPI above,   A full Review of  Systems was done, all other systems reviewed are negative except as noted above in HPI , .    Social History:  Reviewed by me    Social History   Tobacco Use  . Smoking status: Never Smoker  . Smokeless tobacco: Never Used  Substance Use Topics  . Alcohol use: Yes    Comment: occasional     Family History :  Reviewed by me   History reviewed. No pertinent family history.   Home Medications:   Prior to Admission medications   Medication Sig Start Date End Date Taking? Authorizing Provider  acetaminophen (TYLENOL) 325 MG tablet Take 650 mg by mouth every 6 (six) hours as needed.    [provider]  albuterol (VENTOLIN HFA) 108 (90 Base) MCG/ACT inhaler Inhale 2 puffs into the lungs every 4 (four) hours as needed for wheezing or shortness of breath. 10/22/19   Roxan Hockey, MD  ascorbic acid (VITAMIN C) 500 MG tablet Take 1 tablet (500 mg total) by mouth daily. 10/23/19   Roxan Hockey, MD  chlorpheniramine-HYDROcodone (TUSSIONEX) 10-8 MG/5ML SUER Take 5 mLs by mouth every 12 (twelve) hours as needed for cough. 10/22/19   , , MD  guaiFENesin-dextromethorphan (ROBITUSSIN DM) 100-10 MG/5ML syrup Take 10 mLs by mouth every 4 (four) hours as needed for cough. 10/07/19   Kathie Dike, MD  hydrocortisone (ANUSOL-HC) 25 MG suppository Place 1 suppository (25 mg total) rectally 2 (two) times daily. 10/22/19   Roxan Hockey, MD  Multiple Vitamin (MULTIVITAMIN) tablet Take 1 tablet by mouth daily.    [provider]  pantoprazole (PROTONIX) 40 MG tablet Take 1 tablet (40 mg total) by mouth daily. 10/22/19   Roxan Hockey, MD  polyethylene glycol (MIRALAX / GLYCOLAX) 17 g packet Take 17 g by mouth daily. 10/23/19   Roxan Hockey, MD  predniSONE (DELTASONE) 20 MG tablet Take 2 tablets (40 mg total) by mouth daily with breakfast. 10/22/19   , , MD  senna-docusate (SENOKOT-S) 8.6-50 MG tablet Take 2 tablets by mouth at bedtime. 10/22/19   Roxan Hockey, MD  zinc sulfate 220 (50 Zn) MG capsule Take 1 capsule (220 mg total) by mouth daily. 10/23/19   Roxan Hockey, MD     Allergies:    No Known Allergies   Physical Exam:   Vitals  Blood pressure (!) 148/62, pulse 92, temperature 98.7 F (37.1 C), temperature source Oral, resp. rate 18, height 5' 8" (1.727 m), weight (!) 181.4 kg, SpO2 99 %.  Physical Examination: General appearance - alert, morbidly obese appearing, and in no distress  Mental status - alert, oriented  to person, place, and time,  Eyes - sclera anicteric Nose- [place] 2L/min Neck - supple, no JVD elevation , Chest - clear  to auscultation bilaterally, symmetrical air movement,  Heart - S1 and S2 normal, regular  Abdomen - soft, nontender, nondistended, increased truncal adiposity Neurological - screening mental status exam normal, neck supple without rigidity, cranial nerves II through XII intact, DTR's normal and symmetric Extremities - no pedal edema noted, intact peripheral pulses , chronic venous stasis Skin ---- left buttock abscess-please see photo in epic -Media Information   Document Information  Photos  Lt buttocks   10/30/2019 12:06  Attached To:  Hospital Encounter on 10/30/19  Source Information  Roxan Hockey, MD  [facility]        Data Review:    CBC Recent Labs  Lab 10/30/19 0406  WBC 16.9*  HGB 8.8*  HCT 28.7*  PLT 549*  MCV 66.4*  MCH 20.4*  MCHC 30.7  RDW 16.8*   ------------------------------------------------------------------------------------------------------------------  Chemistries  Recent Labs  Lab 10/30/19 0406  NA 138  K 3.2*  CL 100  CO2 28  GLUCOSE 140*  BUN 9  CREATININE 0.94  CALCIUM 8.4*  AST 36   ALT 57*  ALKPHOS 90  BILITOT 0.6   ------------------------------------------------------------------------------------------------------------------ estimated creatinine clearance is 162.8 mL/min (by C-G formula based on SCr of 0.94 mg/dL). ------------------------------------------------------------------------------------------------------------------ No results for input(s): TSH, T4TOTAL, T3FREE, THYROIDAB in the last 72 hours.  Invalid input(s): FREET3   Coagulation profile No results for input(s): INR, PROTIME in the last 168 hours. ------------------------------------------------------------------------------------------------------------------- No results for input(s): DDIMER in the last 72 hours. -------------------------------------------------------------------------------------------------------------------  Cardiac Enzymes No results for input(s): CKMB, TROPONINI, MYOGLOBIN in the last 168 hours.  Invalid input(s): CK ------------------------------------------------------------------------------------------------------------------    Component Value Date/Time   BNP 26.0 09/30/2019 0755     ---------------------------------------------------------------------------------------------------------------  Urinalysis No results found for: COLORURINE, APPEARANCEUR, LABSPEC, PHURINE, GLUCOSEU, HGBUR, BILIRUBINUR, KETONESUR, PROTEINUR, UROBILINOGEN, NITRITE, LEUKOCYTESUR  ----------------------------------------------------------------------------------------------------------------   Imaging Results:    CT PELVIS W CONTRAST  Result Date: 10/30/2019 CLINICAL DATA:  Draining wound on buttocks after trauma several days ago. EXAM: CT PELVIS WITH CONTRAST TECHNIQUE: Multidetector CT imaging of the pelvis was performed using the standard protocol following the bolus administration of intravenous contrast. CONTRAST:  137m OMNIPAQUE IOHEXOL 300 MG/ML  SOLN COMPARISON:  None.  FINDINGS: Mild limitations secondary to patient body habitus. Urinary Tract:  Normal urinary bladder.  No distal hydroureter. Bowel: Suggestion of pelvic floor laxity/descent with possible rectal prolapse, including on 47/4. Otherwise normal pelvic bowel loops. Vascular/Lymphatic: No pelvic aneurysm. Pelvic sidewall adenopathy, most significant in the right external iliac station of 1.6 cm on 25/4. Right inguinal node measures 1.5 cm on 44/4. Reproductive:  Normal prostate. Other: No significant free fluid. Tiny fat containing right inguinal hernia. Edema is identified about the gluteal crease bilaterally, including on 47/4. There are areas of subcutaneous gas on the right with skin irregularity including on 59/4. Minimal left gas on 42/4. no well-defined fluid collection to suggest drainable abscess. Musculoskeletal: No acute osseous abnormality. IMPRESSION: 1. Right greater than left edema and subcutaneous gas about the gluteal creases. Likely indicative of cellulitis. If perianal/perirectal fistula is a concern, the test of choice is high-resolution pre and post-contrast pelvic MRI. Rectal prolapse 2. Suggestion of pelvic floor descent with possible. 3. Pelvic adenopathy, most likely reactive. This could be re-evaluated with pelvic CT follow-up at 3-6 months. 4. Mild degradation secondary to patient body habitus. Electronically Signed   By: KMarylyn Ishihara  Jobe Igo M.D.   On: 10/30/2019 09:34    Radiological Exams on Admission: CT PELVIS W CONTRAST  Result Date: 10/30/2019 CLINICAL DATA:  Draining wound on buttocks after trauma several days ago. EXAM: CT PELVIS WITH CONTRAST TECHNIQUE: Multidetector CT imaging of the pelvis was performed using the standard protocol following the bolus administration of intravenous contrast. CONTRAST:  167m OMNIPAQUE IOHEXOL 300 MG/ML  SOLN COMPARISON:  None. FINDINGS: Mild limitations secondary to patient body habitus. Urinary Tract:  Normal urinary bladder.  No distal hydroureter.  Bowel: Suggestion of pelvic floor laxity/descent with possible rectal prolapse, including on 47/4. Otherwise normal pelvic bowel loops. Vascular/Lymphatic: No pelvic aneurysm. Pelvic sidewall adenopathy, most significant in the right external iliac station of 1.6 cm on 25/4. Right inguinal node measures 1.5 cm on 44/4. Reproductive:  Normal prostate. Other: No significant free fluid. Tiny fat containing right inguinal hernia. Edema is identified about the gluteal crease bilaterally, including on 47/4. There are areas of subcutaneous gas on the right with skin irregularity including on 59/4. Minimal left gas on 42/4. no well-defined fluid collection to suggest drainable abscess. Musculoskeletal: No acute osseous abnormality. IMPRESSION: 1. Right greater than left edema and subcutaneous gas about the gluteal creases. Likely indicative of cellulitis. If perianal/perirectal fistula is a concern, the test of choice is high-resolution pre and post-contrast pelvic MRI. Rectal prolapse 2. Suggestion of pelvic floor descent with possible. 3. Pelvic adenopathy, most likely reactive. This could be re-evaluated with pelvic CT follow-up at 3-6 months. 4. Mild degradation secondary to patient body habitus. Electronically Signed   By: KAbigail MiyamotoM.D.   On: 10/30/2019 09:34    DVT Prophylaxis -SCD /lovenox AM Labs Ordered, also please review Full Orders  Family Communication: Admission, patients condition and plan of care including tests being ordered have been discussed with the patient and wife who indicate understanding and agree with the plan   Code Status - Full Code  Likely DC to  Home after improvement of infection  Condition   stable  CRoxan HockeyM.D on 10/30/2019 at 7:14 PM Go to [email or web address] -  for contact info  [facility] - Office  [phone]

## 2019-10-30 NOTE — ED Notes (Signed)
Assumed care of pt at 0700 

## 2019-10-30 NOTE — ED Triage Notes (Signed)
Pt arrives for wound check of bedsore that he obtained during his last admission for COVID. Pt states he was discharged on abx but the wound has continued to worsen.

## 2019-10-31 DIAGNOSIS — L0231 Cutaneous abscess of buttock: Secondary | ICD-10-CM | POA: Diagnosis not present

## 2019-10-31 DIAGNOSIS — L03317 Cellulitis of buttock: Secondary | ICD-10-CM | POA: Diagnosis not present

## 2019-10-31 DIAGNOSIS — A419 Sepsis, unspecified organism: Secondary | ICD-10-CM | POA: Diagnosis not present

## 2019-10-31 DIAGNOSIS — J9611 Chronic respiratory failure with hypoxia: Secondary | ICD-10-CM

## 2019-10-31 LAB — CBC
HCT: 27.8 % — ABNORMAL LOW (ref 39.0–52.0)
Hemoglobin: 8.3 g/dL — ABNORMAL LOW (ref 13.0–17.0)
MCH: 20 pg — ABNORMAL LOW (ref 26.0–34.0)
MCHC: 29.9 g/dL — ABNORMAL LOW (ref 30.0–36.0)
MCV: 67.1 fL — ABNORMAL LOW (ref 80.0–100.0)
Platelets: 473 10*3/uL — ABNORMAL HIGH (ref 150–400)
RBC: 4.14 MIL/uL — ABNORMAL LOW (ref 4.22–5.81)
RDW: 16.9 % — ABNORMAL HIGH (ref 11.5–15.5)
WBC: 14 10*3/uL — ABNORMAL HIGH (ref 4.0–10.5)
nRBC: 0 % (ref 0.0–0.2)

## 2019-10-31 LAB — BASIC METABOLIC PANEL
Anion gap: 10 (ref 5–15)
BUN: 10 mg/dL (ref 6–20)
CO2: 27 mmol/L (ref 22–32)
Calcium: 8.7 mg/dL — ABNORMAL LOW (ref 8.9–10.3)
Chloride: 99 mmol/L (ref 98–111)
Creatinine, Ser: 0.9 mg/dL (ref 0.61–1.24)
GFR, Estimated: >60 mL/min (ref >60)
Glucose, Bld: 127 mg/dL — ABNORMAL HIGH (ref 70–99)
Potassium: 3.6 mmol/L (ref 3.5–5.1)
Sodium: 136 mmol/L (ref 135–145)

## 2019-10-31 NOTE — Progress Notes (Signed)
PROGRESS NOTE  Ricardo Lopez. WFU:[id number] DOB: August 04, 1975 DOA: 10/30/2019 PCP: Ricardo Brooks, MD  Brief History:  44 year old male with a history of hypertension, morbid obesity, diabetes mellitus type 2 presenting with increasing pain and drainage from his buttocks.  The patient was recently admitted to the hospital from 10/19/2019 to 10/22/2019 secondary to respiratory failure as well as E. coli bacteremia.  The patient was discharged home with cefdinir which he finished on 10/30/2019.  It was felt that his respiratory failure was likely secondary to complications from his COVID-19 pneumonia in the setting of morbid obesity and OHS.  He was discharged home on 2 L nasal cannula.  In addition, the patient was also recently discharged after a stay from 09/29/2019 to 10/07/2019 after treatment for a COVID-19 pneumonia. The patient states that he had some gluteal pain even before he left the hospital on 10/22/2019.  However, he has noted increasing edema and erythema and pain for the last 3 to 4 days.  He is also noted some purulent drainage.  He denied any fevers, chills, chest pain, shortness breath, cough, hemoptysis, vomiting, diarrhea, abdominal pain. In the emergency department, the patient had low-grade temperature of 9 9.1 F.  He was mildly tachycardic up to 110.  He was hemodynamically stable.  Oxygen saturation was 97% on room air.  The patient was started on vancomycin and Zosyn.  General surgery was consulted to assist with management.  Assessment/Plan: Sepsis -Present on admission -Secondary to gluteal abscess -Presented with tachycardia and WBC up to 16.9 -Lactic acid 1.6 -Follow cultures -Continue vancomycin and Zosyn  Gluteal abscess -Appreciate surgical consult -Case discussed with Dr. Jorja Loa suspicion of fistula at this time--> treat as gluteal abscess with outpatient follow-up and consideration for MRI as an outpatient if clinically worsens or no  improvement -Continue vancomycin and Zosyn -WBC decreasing 16.9>> 14.0  Controlled diabetes mellitus type 2 -10/19/2019 hemoglobin A1c 7.1 -NovoLog sliding scale  Morbid obesity -BMI 60.82 -Lifestyle modification  Chronic respiratory failure with hypoxia -Stable on 2 L nasal cannula -Encourage incentive spirometry  Iron deficiency anemia -10/20/2019 iron saturation 7% -Ferritin 289      Status is: Inpatient  Remains inpatient appropriate because:IV treatments appropriate due to intensity of illness or inability to take PO   Dispo: The patient is from: Home              Anticipated d/c is to: Home              Anticipated d/c date is: 3 days              Patient currently is not medically stable to d/c.        Family Communication:  no Family at bedside  Consultants:  General surgery  Code Status:  FULL  DVT Prophylaxis:  [place] Lovenox   Procedures: As Listed in Progress Note Above  Antibiotics: vanco 10/9>> Zosyn 10/9>>       Subjective: Patient denies fevers, chills, headache, chest pain, dyspnea, nausea, vomiting, diarrhea, abdominal pain, dysuria, hematuria, hematochezia, and melena. He complains of gluteal pain  Objective: Vitals:   10/31/19 0250 10/31/19 0300 10/31/19 0500 10/31/19 0830  BP: (!) 133/93 123/82 127/79 (!) 136/92  Pulse: 95 90 90 (!) 104  Resp: 18 18 18 16   Temp:    98.2 F (36.8 C)  TempSrc:    Oral  SpO2: 96% 96% 98% 97%  Weight:  Height:        Intake/Output Summary (Last 24 hours) at 10/31/2019 1023 Last data filed at 10/31/2019 0619 Gross per 24 hour  Intake 1178.47 ml  Output 950 ml  Net 228.47 ml   Weight change:  Exam:   General:  Pt is alert, follows commands appropriately, not in acute distress  HEENT: No icterus, No thrush, No neck mass, [place]/AT  Cardiovascular: RRR, S1/S2, no rubs, no gallops  Respiratory: bibasilar rales. No wheeze  Abdomen: Soft/+BS, non tender, non distended, no  guarding  Extremities: Nonpitting edema, No lymphangitis, No petechiae, No rashes, no synovitis;  R>L gluteal cleft induration with purulent drainage on R.  No necrosis.   Data Reviewed: I have personally reviewed following labs and imaging studies Basic Metabolic Panel: Recent Labs  Lab 10/30/19 0406  NA 138  K 3.2*  CL 100  CO2 28  GLUCOSE 140*  BUN 9  CREATININE 0.94  CALCIUM 8.4*   Liver Function Tests: Recent Labs  Lab 10/30/19 0406  AST 36  ALT 57*  ALKPHOS 90  BILITOT 0.6  PROT 7.6  ALBUMIN 2.3*   No results for input(s): LIPASE, AMYLASE in the last 168 hours. No results for input(s): AMMONIA in the last 168 hours. Coagulation Profile: No results for input(s): INR, PROTIME in the last 168 hours. CBC: Recent Labs  Lab 10/30/19 0406 10/31/19 0947  WBC 16.9* 14.0*  HGB 8.8* 8.3*  HCT 28.7* 27.8*  MCV 66.4* 67.1*  PLT 549* 473*   Cardiac Enzymes: No results for input(s): CKTOTAL, CKMB, CKMBINDEX, TROPONINI in the last 168 hours. BNP: Invalid input(s): POCBNP CBG: No results for input(s): GLUCAP in the last 168 hours. HbA1C: No results for input(s): HGBA1C in the last 72 hours. Urine analysis: No results found for: COLORURINE, APPEARANCEUR, LABSPEC, PHURINE, GLUCOSEU, HGBUR, BILIRUBINUR, KETONESUR, PROTEINUR, UROBILINOGEN, NITRITE, LEUKOCYTESUR Sepsis Labs: @LABRCNTIP (procalcitonin:4,lacticidven:4) ) Recent Results (from the past 240 hour(s))  Aerobic Culture (superficial specimen)     Status: None (Preliminary result)   Collection Time: 10/30/19  3:56 AM   Specimen: Wound  Result Value Ref Range Status   Specimen Description WOUND  Final   Special Requests BUTTOCKS  Final   Gram Stain   Final    FEW WBC PRESENT,BOTH PMN AND MONONUCLEAR ABUNDANT GRAM NEGATIVE RODS ABUNDANT GRAM POSITIVE COCCI IN PAIRS Performed at [facility], [address] [place], [place] [address]    Culture PENDING  Incomplete   Report Status PENDING  Incomplete      Scheduled Meds: . ascorbic acid  500 mg Oral Daily  . enoxaparin (LOVENOX) injection  80 mg Subcutaneous Q24H  . multivitamin with minerals  1 tablet Oral Daily  . pantoprazole  40 mg Oral Daily  . polyethylene glycol  17 g Oral Daily  . senna-docusate  2 tablet Oral QHS  . sodium chloride flush  3 mL Intravenous Q12H  . sodium chloride flush  3 mL Intravenous Q12H  . zinc sulfate  220 mg Oral Daily   Continuous Infusions: . sodium chloride    . clindamycin (CLEOCIN) IV Stopped (10/31/19 [id number])  . piperacillin-tazobactam (ZOSYN)  IV Stopped (10/31/19 0545)  . vancomycin 1,250 mg (10/31/19 0832)    Procedures/Studies: DG Chest 2 View  Result Date: 10/19/2019 CLINICAL DATA:  Persistent cough post discharge with COVID. EXAM: CHEST - 2 VIEW COMPARISON:  09/29/2019 FINDINGS: Extensive bilateral airspace opacities. No visible pleural effusions or pneumothorax. Cardiac silhouette is enlarged and accentuated by low lung volumes. No  evidence of acute osseous abnormality. IMPRESSION: 1. Extensive bilateral airspace opacities, compatible with multifocal pneumonia. 2. Suspected cardiomegaly. Electronically Signed   By: Feliberto Harts MD   On: 10/19/2019 16:24   CT Angio Chest PE W and/or Wo Contrast  Result Date: 10/19/2019 CLINICAL DATA:  Cough and shortness of breath. COVID-19 virus infection. EXAM: CT ANGIOGRAPHY CHEST WITH CONTRAST TECHNIQUE: Multidetector CT imaging of the chest was performed using the standard protocol during bolus administration of intravenous contrast. Multiplanar CT image reconstructions and MIPs were obtained to evaluate the vascular anatomy. Study was performed twice due to suboptimal opacification of pulmonary arteries. CONTRAST:  OMNIPAQUE IOHEXOL 350 MG/ML SOLN COMPARISON:  10/03/2019 FINDINGS: Cardiovascular: Suboptimal contrast opacification of the pulmonary arteries is demonstrated on both scans, likely due to contrast injection triggering being  compromised by patient breathing during the exam, as well as pulmonary airspace disease. No large central pulmonary emboli are identified, however pulmonary emboli in sub lobar pulmonary arteries cannot be excluded on this exam. Mediastinum/Nodes: No masses or pathologically enlarged lymph nodes identified. Lungs/Pleura: Heterogeneous airspace opacity is again seen throughout both lungs, without significant change since prior exam. No evidence of parenchymal cavitation or pleural effusion. Upper abdomen: No acute findings. Musculoskeletal: No suspicious bone lesions identified. Review of the MIP images confirms the above findings. IMPRESSION: Suboptimal contrast opacification of pulmonary arteries. No large central pulmonary emboli identified, however pulmonary emboli in sub-lobar pulmonary arteries cannot be excluded. No significant change in severe bilateral pulmonary airspace opacity, consistent with viral pneumonia. Electronically Signed   By: Danae Orleans M.D.   On: 10/19/2019 19:45   CT ANGIO CHEST PE W OR WO CONTRAST  Result Date: 10/03/2019 CLINICAL DATA:  Shortness of breath.  COVID positive. EXAM: CT ANGIOGRAPHY CHEST WITH CONTRAST TECHNIQUE: Multidetector CT imaging of the chest was performed using the standard protocol during bolus administration of intravenous contrast. Multiplanar CT image reconstructions and MIPs were obtained to evaluate the vascular anatomy. CONTRAST:  OMNIPAQUE IOHEXOL 350 MG/ML SOLN COMPARISON:  None. FINDINGS: Cardiovascular: Evaluation for pulmonary emboli is limited by respiratory motion artifact and patient body habitus. There is no pulmonary embolus or evidence of right heart strain. The size of the main pulmonary artery is normal. Heart size is normal, with no pericardial effusion. The course and caliber of the aorta are normal. There is no atherosclerotic calcification. Opacification decreased due to pulmonary arterial phase contrast bolus timing.  Mediastinum/Nodes: -- No mediastinal lymphadenopathy. -- No hilar lymphadenopathy. -- No axillary lymphadenopathy. -- No supraclavicular lymphadenopathy. -- Normal thyroid gland where visualized. -  Unremarkable esophagus. Lungs/Pleura: There are diffuse bilateral ground-glass airspace opacities without evidence for pneumothorax or significant pleural effusion. The trachea is unremarkable. Upper Abdomen: Contrast bolus timing is not optimized for evaluation of the abdominal organs. There is probable hepatic steatosis. Musculoskeletal: No chest wall abnormality. No bony spinal canal stenosis. Review of the MIP images confirms the above findings. IMPRESSION: 1. Evaluation for pulmonary emboli is limited by respiratory motion artifact and patient body habitus. Given this limitation, there is no evidence for acute pulmonary embolus. 2. Diffuse bilateral ground-glass airspace opacities, consistent with the patient's history of viral pneumonia. 3. Probable hepatic steatosis. Electronically Signed   By: Katherine Mantle M.D.   On: 10/03/2019 17:42   CT PELVIS W CONTRAST  Result Date: 10/30/2019 CLINICAL DATA:  Draining wound on buttocks after trauma several days ago. EXAM: CT PELVIS WITH CONTRAST TECHNIQUE: Multidetector CT imaging of the pelvis was performed using the standard  protocol following the bolus administration of intravenous contrast. CONTRAST:  OMNIPAQUE IOHEXOL 300 MG/ML  SOLN COMPARISON:  None. FINDINGS: Mild limitations secondary to patient body habitus. Urinary Tract:  Normal urinary bladder.  No distal hydroureter. Bowel: Suggestion of pelvic floor laxity/descent with possible rectal prolapse, including on 47/4. Otherwise normal pelvic bowel loops. Vascular/Lymphatic: No pelvic aneurysm. Pelvic sidewall adenopathy, most significant in the right external iliac station of 1.6 cm on 25/4. Right inguinal node measures 1.5 cm on 44/4. Reproductive:  Normal prostate. Other: No significant free fluid.  Tiny fat containing right inguinal hernia. Edema is identified about the gluteal crease bilaterally, including on 47/4. There are areas of subcutaneous gas on the right with skin irregularity including on 59/4. Minimal left gas on 42/4. no well-defined fluid collection to suggest drainable abscess. Musculoskeletal: No acute osseous abnormality. IMPRESSION: 1. Right greater than left edema and subcutaneous gas about the gluteal creases. Likely indicative of cellulitis. If perianal/perirectal fistula is a concern, the test of choice is high-resolution pre and post-contrast pelvic MRI. Rectal prolapse 2. Suggestion of pelvic floor descent with possible. 3. Pelvic adenopathy, most likely reactive. This could be re-evaluated with pelvic CT follow-up at 3-6 months. 4. Mild degradation secondary to patient body habitus. Electronically Signed   By: Jeronimo Greaves M.D.   On: 10/30/2019 09:34   US Venous Img Lower Bilateral (DVT)  Result Date: 10/21/2019 CLINICAL DATA:  Shortness of breath, COVID-19 positivity, possible deep venous thrombosis. EXAM: BILATERAL LOWER EXTREMITY VENOUS DOPPLER ULTRASOUND TECHNIQUE: Gray-scale sonography with graded compression, as well as color Doppler and duplex ultrasound were performed to evaluate the lower extremity deep venous systems from the level of the common femoral vein and including the common femoral, femoral, profunda femoral, popliteal and calf veins including the posterior tibial, peroneal and gastrocnemius veins when visible. The superficial great saphenous vein was also interrogated. Spectral Doppler was utilized to evaluate flow at rest and with distal augmentation maneuvers in the common femoral, femoral and popliteal veins. COMPARISON:  None. FINDINGS: RIGHT LOWER EXTREMITY Common Femoral Vein: No evidence of thrombus. Normal compressibility, respiratory phasicity and response to augmentation. Saphenofemoral Junction: No evidence of thrombus. Normal compressibility and flow  on color Doppler imaging. Profunda Femoral Vein: No evidence of thrombus. Normal compressibility and flow on color Doppler imaging. Femoral Vein: No evidence of thrombus. Normal compressibility, respiratory phasicity and response to augmentation. Popliteal Vein: No evidence of thrombus. Normal compressibility, respiratory phasicity and response to augmentation. Calf Veins: Calf veins are not well visualized due to the patient's body habitus Superficial Great Saphenous Vein: No evidence of thrombus. Normal compressibility. Venous Reflux:  None. Other Findings:  None. LEFT LOWER EXTREMITY Common Femoral Vein: No evidence of thrombus. Normal compressibility, respiratory phasicity and response to augmentation. Saphenofemoral Junction: No evidence of thrombus. Normal compressibility and flow on color Doppler imaging. Profunda Femoral Vein: No evidence of thrombus. Normal compressibility and flow on color Doppler imaging. Femoral Vein: No evidence of thrombus. Normal compressibility, respiratory phasicity and response to augmentation. Popliteal Vein: No evidence of thrombus. Normal compressibility, respiratory phasicity and response to augmentation. Calf Veins: Calf veins are not well visualized due to the patient's body habitus. Superficial Great Saphenous Vein: No evidence of thrombus. Normal compressibility. Venous Reflux:  None. Other Findings:  None. IMPRESSION: No evidence of deep venous thrombosis in either lower extremity. Electronically Signed   By: Alcide Clever M.D.   On: 10/21/2019 10:24   DG Chest Port 1 View  Result Date: 10/19/2019 CLINICAL DATA:  Shortness of breath. COVID positive. EXAM: PORTABLE CHEST 1 VIEW COMPARISON:  October 19, 2019 (3:56 p.m.) FINDINGS: Persistent moderate to marked severity bilateral infiltrates are seen. Very mildly improved aeration of both lungs is noted. There is no evidence of a pleural effusion or pneumothorax. The cardiac silhouette is mildly enlarged and unchanged in  size. The visualized skeletal structures are unremarkable. IMPRESSION: Persistent moderate to marked severity bilateral infiltrates. Electronically Signed   By: Aram Candela M.D.   On: 10/19/2019 18:37   ECHOCARDIOGRAM COMPLETE  Result Date: 10/20/2019    ECHOCARDIOGRAM REPORT   Patient Name:   Ricardo Lopez. Date of Exam: 10/20/2019 Medical Rec #:  [id number]          Height:       68.0 in Accession #:    [id number]         Weight:       350.0 lb Date of Birth:  1975/11/07         BSA:          2.592 m Patient Age:    43 years           BP:           122/75 mmHg Patient Gender: M                  HR:           104 bpm. [facility] Procedure: 2D Echo, Cardiac Doppler and Color Doppler Indications:    Pulmonary hypertension 416.8 / I27.2  History:        Patient has no prior history of Echocardiogram examinations.                 Risk Factors:Hypertension. Admit with Pneumonia due to COVID-19                 virus, Severe obesity, Acute pulmonary embolism.  Sonographer:    Celesta Gentile RCS Referring Phys: [id number] OLADAPO ADEFESO IMPRESSIONS  1. Left ventricular ejection fraction, by estimation, is 55 to 60%. The left ventricle has normal function. The left ventricle has no regional wall motion abnormalities. There is mild left ventricular hypertrophy. Left ventricular diastolic parameters are indeterminate.  2. Right ventricular systolic function is mildly reduced. The right ventricular size is normal. Tricuspid regurgitation signal is inadequate for assessing PA pressure.  3. Left atrial size was moderately dilated.  4. The mitral valve is grossly normal. Trivial mitral valve regurgitation.  5. The aortic valve is tricuspid. Aortic valve regurgitation is not visualized.  6. The inferior vena cava is dilated in size with <50% respiratory variability, suggesting right atrial pressure of 15 mmHg. FINDINGS  Left Ventricle: Left ventricular ejection fraction, by estimation, is 55 to 60%. The  left ventricle has normal function. The left ventricle has no regional wall motion abnormalities. The left ventricular internal cavity size was normal in size. There is  mild left ventricular hypertrophy. Left ventricular diastolic parameters are indeterminate. Right Ventricle: The right ventricular size is normal. No increase in right ventricular wall thickness. Right ventricular systolic function is mildly reduced. Tricuspid regurgitation signal is inadequate for assessing PA pressure. Left Atrium: Left atrial size was moderately dilated. Right Atrium: Right atrial size was normal in size. Pericardium: There is no evidence of pericardial effusion. Mitral Valve: The mitral valve is grossly normal. Trivial mitral valve regurgitation. Tricuspid Valve: The tricuspid valve is grossly normal. Tricuspid valve regurgitation is trivial. Aortic Valve: The aortic  valve is tricuspid. Aortic valve regurgitation is not visualized. Pulmonic Valve: The pulmonic valve was grossly normal. Pulmonic valve regurgitation is trivial. Aorta: The aortic root is normal in size and structure. Venous: The inferior vena cava is dilated in size with less than 50% respiratory variability, suggesting right atrial pressure of 15 mmHg. IAS/Shunts: No atrial level shunt detected by color flow Doppler.  LEFT VENTRICLE PLAX 2D LVIDd:         5.54 cm LVIDs:         3.86 cm LV PW:         1.27 cm LV IVS:        1.37 cm LVOT diam:     2.50 cm LV SV:         126 LV SV Index:   49 LVOT Area:     4.91 cm  RIGHT VENTRICLE TAPSE (M-mode): 3.6 cm LEFT ATRIUM              Index       RIGHT ATRIUM           Index LA diam:        4.80 cm  1.85 cm/m  RA Area:     20.30 cm LA Vol (A2C):   99.8 ml  38.50 ml/m RA Volume:   63.50 ml  24.49 ml/m LA Vol (A4C):   116.0 ml 44.74 ml/m LA Biplane Vol: 108.0 ml 41.66 ml/m  AORTIC VALVE LVOT Vmax:   126.00 cm/s LVOT Vmean:  76.600 cm/s LVOT VTI:    0.257 m  AORTA Ao Root diam: 3.70 cm MITRAL VALVE MV Area (PHT): 4.49  cm     SHUNTS MV Decel Time: 169 msec     Systemic VTI:  0.26 m MV E velocity: 141.00 cm/s  Systemic Diam: 2.50 cm Nona Dell MD Electronically signed by Nona Dell MD Signature Date/Time: 10/20/2019/3:49:54 PM    Final     Catarina Hartshorn, DO  [facility]  If 7PM-7AM, please contact night-coverage [email or web address] Password [email or web address] 10/31/2019, 10:23 AM   LOS: 1 Lopez

## 2019-10-31 NOTE — Progress Notes (Signed)
Subjective: Describes less pain in the buttock.  Objective: Vital signs in last 24 hours: Temp:  [97.2 F (36.2 C)-98.7 F (37.1 C)] 98.2 F (36.8 C) (10/10 0830) Pulse Rate:  [90-118] 104 (10/10 0830) Resp:  [16-18] 16 (10/10 0830) BP: (123-150)/(60-93) 136/92 (10/10 0830) SpO2:  [95 %-99 %] 97 % (10/10 0830)    Intake/Output from previous day: 10/09 0701 - 10/10 0700 In: 1178.5 [IV Piggyback:1178.5] Out: 950 [Urine:950] Intake/Output this shift: No intake/output data recorded.  General appearance: alert, cooperative and no distress Right buttock with large full-thickness wound to soft tissue.  Granulation tissue noted at the base.  Some purulent drainage is noted.  No stools present.  Lab Results:  Recent Labs    10/30/19 0406 10/31/19 0947  WBC 16.9* 14.0*  HGB 8.8* 8.3*  HCT 28.7* 27.8*  PLT 549* 473*   BMET Recent Labs    10/30/19 0406 10/31/19 0947  NA 138 136  K 3.2* 3.6  CL 100 99  CO2 28 27  GLUCOSE 140* 127*  BUN 9 10  CREATININE 0.94 0.90  CALCIUM 8.4* 8.7*   PT/INR No results for input(s): LABPROT, INR in the last 72 hours.  Studies/Results: CT PELVIS W CONTRAST  Result Date: 10/30/2019 CLINICAL DATA:  Draining wound on buttocks after trauma several days ago. EXAM: CT PELVIS WITH CONTRAST TECHNIQUE: Multidetector CT imaging of the pelvis was performed using the standard protocol following the bolus administration of intravenous contrast. CONTRAST:  OMNIPAQUE IOHEXOL 300 MG/ML  SOLN COMPARISON:  None. FINDINGS: Mild limitations secondary to patient body habitus. Urinary Tract:  Normal urinary bladder.  No distal hydroureter. Bowel: Suggestion of pelvic floor laxity/descent with possible rectal prolapse, including on 47/4. Otherwise normal pelvic bowel loops. Vascular/Lymphatic: No pelvic aneurysm. Pelvic sidewall adenopathy, most significant in the right external iliac station of 1.6 cm on 25/4. Right inguinal node measures 1.5 cm on 44/4.  Reproductive:  Normal prostate. Other: No significant free fluid. Tiny fat containing right inguinal hernia. Edema is identified about the gluteal crease bilaterally, including on 47/4. There are areas of subcutaneous gas on the right with skin irregularity including on 59/4. Minimal left gas on 42/4. no well-defined fluid collection to suggest drainable abscess. Musculoskeletal: No acute osseous abnormality. IMPRESSION: 1. Right greater than left edema and subcutaneous gas about the gluteal creases. Likely indicative of cellulitis. If perianal/perirectal fistula is a concern, the test of choice is high-resolution pre and post-contrast pelvic MRI. Rectal prolapse 2. Suggestion of pelvic floor descent with possible. 3. Pelvic adenopathy, most likely reactive. This could be re-evaluated with pelvic CT follow-up at 3-6 months. 4. Mild degradation secondary to patient body habitus. Electronically Signed   By: Jeronimo Greaves M.D.   On: 10/30/2019 09:34    Anti-infectives: Anti-infectives (From admission, onward)   Start     Dose/Rate Route Frequency Ordered Stop   10/30/19 2000  vancomycin (VANCOREADY) IVPB 2000 mg/400 mL  Status:  Discontinued        2,000 mg 200 mL/hr over 120 Minutes Intravenous Every 12 hours 10/30/19 0742 10/30/19 1307   10/30/19 2000  vancomycin (VANCOREADY) IVPB 1250 mg/250 mL        1,250 mg 166.7 mL/hr over 90 Minutes Intravenous Every 12 hours 10/30/19 1307     10/30/19 1900  piperacillin-tazobactam (ZOSYN) IVPB 3.375 g        3.375 g 12.5 mL/hr over 240 Minutes Intravenous Every 8 hours 10/30/19 1230     10/30/19 1400  clindamycin (CLEOCIN)  IVPB 600 mg        600 mg 100 mL/hr over 30 Minutes Intravenous Every 8 hours 10/30/19 1025 11/02/19 1359   10/30/19 1200  piperacillin-tazobactam (ZOSYN) IVPB 3.375 g  Status:  Discontinued        3.375 g 12.5 mL/hr over 240 Minutes Intravenous Every 8 hours 10/30/19 1145 10/30/19 1230   10/30/19 1115  piperacillin-tazobactam (ZOSYN)  IVPB 3.375 g        3.375 g 100 mL/hr over 30 Minutes Intravenous  Once 10/30/19 1025 10/30/19 1129   10/30/19 1030  vancomycin (VANCOCIN) IVPB 1000 mg/200 mL premix  Status:  Discontinued        1,000 mg 200 mL/hr over 60 Minutes Intravenous  Once 10/30/19 1025 10/30/19 1026   10/30/19 0630  vancomycin (VANCOREADY) IVPB 2000 mg/400 mL        2,000 mg 200 mL/hr over 120 Minutes Intravenous  Once 10/30/19 8144 10/30/19 1230      Assessment/Plan: Impression: Perirectal/gluteal abscess on the right.  No stool is present.  Given patient's body habitus, patient will probably not fit in the MRI machine.  At this point, we will treat the perirectal abscess and any possible perianal fistula can be assessed as an outpatient.  Leukocytosis improving.  Would continue wound care and IV antibiotics.  LOS: 1 day    Ricardo Lopez 10/31/2019

## 2019-11-01 DIAGNOSIS — A419 Sepsis, unspecified organism: Secondary | ICD-10-CM | POA: Diagnosis not present

## 2019-11-01 DIAGNOSIS — L0231 Cutaneous abscess of buttock: Secondary | ICD-10-CM | POA: Diagnosis not present

## 2019-11-01 DIAGNOSIS — J9611 Chronic respiratory failure with hypoxia: Secondary | ICD-10-CM | POA: Diagnosis not present

## 2019-11-01 NOTE — Progress Notes (Signed)
While cleansing the wound on the right buttock and changing the dressing, nurse noticed the left buttock had a red area with small white patches.  The nurse notified Dr. Lovell Sheehan.

## 2019-11-01 NOTE — Progress Notes (Signed)
PROGRESS NOTE  Ricardo Parkobert E Nowling Jr. ZOX:[id number] DOB: [birth year] DOA: 10/30/2019 PCP: Donita BrooksPickard, Warren T, MD  Brief History:  44 year old male with a history of hypertension, morbid obesity, diabetes mellitus type 2 presenting with increasing pain and drainage from his buttocks.  The patient was recently admitted to the hospital from 10/19/2019 to 10/22/2019 secondary to respiratory failure as well as E. coli bacteremia.  The patient was discharged home with cefdinir which he finished on 10/30/2019.  It was felt that his respiratory failure was likely secondary to complications from his COVID-19 pneumonia in the setting of morbid obesity and OHS.  He was discharged home on 2 L nasal cannula.  In addition, the patient was also recently discharged after a stay from 09/29/2019 to 10/07/2019 after treatment for a COVID-19 pneumonia. The patient states that he had some gluteal pain even before he left the hospital on 10/22/2019.  However, he has noted increasing edema and erythema and pain for the last 3 to 4 days.  He is also noted some purulent drainage.  He denied any fevers, chills, chest pain, shortness breath, cough, hemoptysis, vomiting, diarrhea, abdominal pain. In the emergency department, the patient had low-grade temperature of 9 9.1 F.  He was mildly tachycardic up to 110.  He was hemodynamically stable.  Oxygen saturation was 97% on room air.  The patient was started on vancomycin and Zosyn.  General surgery was consulted to assist with management.  Assessment/Plan: Sepsis -Present on admission -Secondary to gluteal abscess -Presented with tachycardia and WBC up to 16.9 -Lactic acid 1.6 -Follow cultures -Continue vancomycin and Zosyn  Gluteal abscess -Appreciate surgical consult -Case discussed with Dr. Jorja LoaJenkins--low suspicion of fistula at this time--> treat as gluteal abscess with outpatient follow-up and consideration for MRI as an outpatient if clinically worsens or no  improvement -Continue vancomycin and Zosyn -WBC decreasing 16.9>> 14.0  Controlled diabetes mellitus type 2 -10/19/2019 hemoglobin A1c 7.1 -NovoLog sliding scale  Morbid obesity -BMI 60.82 -Lifestyle modification  Chronic respiratory failure with hypoxia -Stable on 2 L nasal cannula -Encourage incentive spirometry  Iron deficiency anemia -10/20/2019 iron saturation 7% -Ferritin 289      Status is: Inpatient  Remains inpatient appropriate because:IV treatments appropriate due to intensity of illness or inability to take PO   Dispo: The patient is from: Home  Anticipated d/c is to: Home  Anticipated d/c date is: 3 days  Patient currently is not medically stable to d/c.        Family Communication:  spouse updated 10/11  Consultants:  General surgery  Code Status:  FULL  DVT Prophylaxis:  [place] Lovenox   Procedures: As Listed in Progress Note Above  Antibiotics: vanco 10/9>> Zosyn 10/9>>      Subjective: Patient denies fevers, chills, headache, chest pain, dyspnea, nausea, vomiting, diarrhea, abdominal pain, dysuria, hematuria, hematochezia, and melena.   Objective: Vitals:   10/31/19 1453 10/31/19 2040 11/01/19 0543 11/01/19 1314  BP: 124/69 131/79 122/71 124/86  Pulse: 99 (!) 108 99 (!) 103  Resp: 18 20 20 18   Temp:  98.4 F (36.9 C) 98.6 F (37 C) 98.6 F (37 C)  TempSrc:  Oral Oral   SpO2: 97% 100% 100% 97%  Weight:      Height:        Intake/Output Summary (Last 24 hours) at 11/01/2019 1809 Last data filed at 11/01/2019 1300 Gross per 24 hour  Intake 1086 ml  Output --  Net  1086 ml   Weight change:  Exam:   General:  Pt is alert, follows commands appropriately, not in acute distress  HEENT: No icterus, No thrush, No neck mass, [place]/AT  Cardiovascular: RRR, S1/S2, no rubs, no gallops  Respiratory: bibasilar rales  Abdomen: Soft/+BS, non tender, non distended, no  guarding  Extremities: Nonpitting edema, No lymphangitis, No petechiae, No rashes, no synovitis   Data Reviewed: I have personally reviewed following labs and imaging studies Basic Metabolic Panel: Recent Labs  Lab 10/30/19 0406 10/31/19 0947  NA 138 136  K 3.2* 3.6  CL 100 99  CO2 28 27  GLUCOSE 140* 127*  BUN 9 10  CREATININE 0.94 0.90  CALCIUM 8.4* 8.7*   Liver Function Tests: Recent Labs  Lab 10/30/19 0406  AST 36  ALT 57*  ALKPHOS 90  BILITOT 0.6  PROT 7.6  ALBUMIN 2.3*   No results for input(s): LIPASE, AMYLASE in the last 168 hours. No results for input(s): AMMONIA in the last 168 hours. Coagulation Profile: No results for input(s): INR, PROTIME in the last 168 hours. CBC: Recent Labs  Lab 10/30/19 0406 10/31/19 0947  WBC 16.9* 14.0*  HGB 8.8* 8.3*  HCT 28.7* 27.8*  MCV 66.4* 67.1*  PLT 549* 473*   Cardiac Enzymes: No results for input(s): CKTOTAL, CKMB, CKMBINDEX, TROPONINI in the last 168 hours. BNP: Invalid input(s): POCBNP CBG: No results for input(s): GLUCAP in the last 168 hours. HbA1C: No results for input(s): HGBA1C in the last 72 hours. Urine analysis: No results found for: COLORURINE, APPEARANCEUR, LABSPEC, PHURINE, GLUCOSEU, HGBUR, BILIRUBINUR, KETONESUR, PROTEINUR, UROBILINOGEN, NITRITE, LEUKOCYTESUR Sepsis Labs: @LABRCNTIP (procalcitonin:4,lacticidven:4) ) Recent Results (from the past 240 hour(s))  Aerobic Culture (superficial specimen)     Status: None (Preliminary result)   Collection Time: 10/30/19  3:56 AM   Specimen: Wound  Result Value Ref Range Status   Specimen Description WOUND  Final   Special Requests BUTTOCKS  Final   Gram Stain   Final    FEW WBC PRESENT,BOTH PMN AND MONONUCLEAR ABUNDANT GRAM NEGATIVE RODS ABUNDANT GRAM POSITIVE COCCI IN PAIRS    Culture   Final    MODERATE ESCHERICHIA COLI FEW ENTEROCOCCUS FAECIUM SUSCEPTIBILITIES TO FOLLOW Performed at [facility], [address] [place],  [place]    Report Status PENDING  Incomplete   Organism ID, Bacteria ESCHERICHIA COLI  Final      Susceptibility   Escherichia coli - MIC*    AMPICILLIN <=2 SENSITIVE Sensitive     CEFAZOLIN <=4 SENSITIVE Sensitive     CEFEPIME <=0.12 SENSITIVE Sensitive     CEFTAZIDIME <=1 SENSITIVE Sensitive     CEFTRIAXONE <=0.25 SENSITIVE Sensitive     CIPROFLOXACIN <=0.25 SENSITIVE Sensitive     GENTAMICIN <=1 SENSITIVE Sensitive     IMIPENEM <=0.25 SENSITIVE Sensitive     TRIMETH/SULFA <=20 SENSITIVE Sensitive     AMPICILLIN/SULBACTAM <=2 SENSITIVE Sensitive     PIP/TAZO <=4 SENSITIVE Sensitive     * MODERATE ESCHERICHIA COLI  Aerobic/Anaerobic Culture (surgical/deep wound)     Status: None (Preliminary result)   Collection Time: 10/30/19  8:16 PM   Specimen: Buttocks; Abscess  Result Value Ref Range Status   Specimen Description   Final    BUTTOCKS Performed at [facility], [address] [place], [place]    Special Requests   Final    Normal Performed at [facility], [address], [place], [place]    Gram Stain  Final    RARE WBC PRESENT, PREDOMINANTLY PMN FEW GRAM POSITIVE COCCI RARE GRAM NEGATIVE RODS    Culture   Final    FEW GRAM NEGATIVE RODS IDENTIFICATION AND SUSCEPTIBILITIES TO FOLLOW Performed at [facility], [address] [place], [place] [address]    Report Status PENDING  Incomplete     Scheduled Meds: . ascorbic acid  500 mg Oral Daily  . enoxaparin (LOVENOX) injection  80 mg Subcutaneous Q24H  . multivitamin with minerals  1 tablet Oral Daily  . pantoprazole  40 mg Oral Daily  . sodium chloride flush  3 mL Intravenous Q12H  . sodium chloride flush  3 mL Intravenous Q12H  . zinc sulfate  220 mg Oral Daily   Continuous Infusions: . sodium chloride    . piperacillin-tazobactam (ZOSYN)  IV 3.375 g (11/01/19 1038)  . vancomycin 1,250 mg (11/01/19 0750)    Procedures/Studies: DG Chest 2 View  Result Date:  10/19/2019 CLINICAL DATA:  Persistent cough post discharge with COVID. EXAM: CHEST - 2 VIEW COMPARISON:  09/29/2019 FINDINGS: Extensive bilateral airspace opacities. No visible pleural effusions or pneumothorax. Cardiac silhouette is enlarged and accentuated by low lung volumes. No evidence of acute osseous abnormality. IMPRESSION: 1. Extensive bilateral airspace opacities, compatible with multifocal pneumonia. 2. Suspected cardiomegaly. Electronically Signed   By: Feliberto Harts MD   On: 10/19/2019 16:24   CT Angio Chest PE W and/or Wo Contrast  Result Date: 10/19/2019 CLINICAL DATA:  Cough and shortness of breath. COVID-19 virus infection. EXAM: CT ANGIOGRAPHY CHEST WITH CONTRAST TECHNIQUE: Multidetector CT imaging of the chest was performed using the standard protocol during bolus administration of intravenous contrast. Multiplanar CT image reconstructions and MIPs were obtained to evaluate the vascular anatomy. Study was performed twice due to suboptimal opacification of pulmonary arteries. CONTRAST:  OMNIPAQUE IOHEXOL 350 MG/ML SOLN COMPARISON:  10/03/2019 FINDINGS: Cardiovascular: Suboptimal contrast opacification of the pulmonary arteries is demonstrated on both scans, likely due to contrast injection triggering being compromised by patient breathing during the exam, as well as pulmonary airspace disease. No large central pulmonary emboli are identified, however pulmonary emboli in sub lobar pulmonary arteries cannot be excluded on this exam. Mediastinum/Nodes: No masses or pathologically enlarged lymph nodes identified. Lungs/Pleura: Heterogeneous airspace opacity is again seen throughout both lungs, without significant change since prior exam. No evidence of parenchymal cavitation or pleural effusion. Upper abdomen: No acute findings. Musculoskeletal: No suspicious bone lesions identified. Review of the MIP images confirms the above findings. IMPRESSION: Suboptimal contrast opacification of  pulmonary arteries. No large central pulmonary emboli identified, however pulmonary emboli in sub-lobar pulmonary arteries cannot be excluded. No significant change in severe bilateral pulmonary airspace opacity, consistent with viral pneumonia. Electronically Signed   By: Danae Orleans M.D.   On: 10/19/2019 19:45   CT ANGIO CHEST PE W OR WO CONTRAST  Result Date: 10/03/2019 CLINICAL DATA:  Shortness of breath.  COVID positive. EXAM: CT ANGIOGRAPHY CHEST WITH CONTRAST TECHNIQUE: Multidetector CT imaging of the chest was performed using the standard protocol during bolus administration of intravenous contrast. Multiplanar CT image reconstructions and MIPs were obtained to evaluate the vascular anatomy. CONTRAST:  OMNIPAQUE IOHEXOL 350 MG/ML SOLN COMPARISON:  None. FINDINGS: Cardiovascular: Evaluation for pulmonary emboli is limited by respiratory motion artifact and patient body habitus. There is no pulmonary embolus or evidence of right heart strain. The size of the main pulmonary artery is normal. Heart size is normal, with no pericardial effusion. The course  and caliber of the aorta are normal. There is no atherosclerotic calcification. Opacification decreased due to pulmonary arterial phase contrast bolus timing. Mediastinum/Nodes: -- No mediastinal lymphadenopathy. -- No hilar lymphadenopathy. -- No axillary lymphadenopathy. -- No supraclavicular lymphadenopathy. -- Normal thyroid gland where visualized. -  Unremarkable esophagus. Lungs/Pleura: There are diffuse bilateral ground-glass airspace opacities without evidence for pneumothorax or significant pleural effusion. The trachea is unremarkable. Upper Abdomen: Contrast bolus timing is not optimized for evaluation of the abdominal organs. There is probable hepatic steatosis. Musculoskeletal: No chest wall abnormality. No bony spinal canal stenosis. Review of the MIP images confirms the above findings. IMPRESSION: 1. Evaluation for pulmonary emboli is  limited by respiratory motion artifact and patient body habitus. Given this limitation, there is no evidence for acute pulmonary embolus. 2. Diffuse bilateral ground-glass airspace opacities, consistent with the patient's history of viral pneumonia. 3. Probable hepatic steatosis. Electronically Signed   By: Katherine Mantle M.D.   On: 10/03/2019 17:42   CT PELVIS W CONTRAST  Result Date: 10/30/2019 CLINICAL DATA:  Draining wound on buttocks after trauma several days ago. EXAM: CT PELVIS WITH CONTRAST TECHNIQUE: Multidetector CT imaging of the pelvis was performed using the standard protocol following the bolus administration of intravenous contrast. CONTRAST:  OMNIPAQUE IOHEXOL 300 MG/ML  SOLN COMPARISON:  None. FINDINGS: Mild limitations secondary to patient body habitus. Urinary Tract:  Normal urinary bladder.  No distal hydroureter. Bowel: Suggestion of pelvic floor laxity/descent with possible rectal prolapse, including on 47/4. Otherwise normal pelvic bowel loops. Vascular/Lymphatic: No pelvic aneurysm. Pelvic sidewall adenopathy, most significant in the right external iliac station of 1.6 cm on 25/4. Right inguinal node measures 1.5 cm on 44/4. Reproductive:  Normal prostate. Other: No significant free fluid. Tiny fat containing right inguinal hernia. Edema is identified about the gluteal crease bilaterally, including on 47/4. There are areas of subcutaneous gas on the right with skin irregularity including on 59/4. Minimal left gas on 42/4. no well-defined fluid collection to suggest drainable abscess. Musculoskeletal: No acute osseous abnormality. IMPRESSION: 1. Right greater than left edema and subcutaneous gas about the gluteal creases. Likely indicative of cellulitis. If perianal/perirectal fistula is a concern, the test of choice is high-resolution pre and post-contrast pelvic MRI. Rectal prolapse 2. Suggestion of pelvic floor descent with possible. 3. Pelvic adenopathy, most likely reactive.  This could be re-evaluated with pelvic CT follow-up at 3-6 months. 4. Mild degradation secondary to patient body habitus. Electronically Signed   By: Jeronimo Greaves M.D.   On: 10/30/2019 09:34   US Venous Img Lower Bilateral (DVT)  Result Date: 10/21/2019 CLINICAL DATA:  Shortness of breath, COVID-19 positivity, possible deep venous thrombosis. EXAM: BILATERAL LOWER EXTREMITY VENOUS DOPPLER ULTRASOUND TECHNIQUE: Gray-scale sonography with graded compression, as well as color Doppler and duplex ultrasound were performed to evaluate the lower extremity deep venous systems from the level of the common femoral vein and including the common femoral, femoral, profunda femoral, popliteal and calf veins including the posterior tibial, peroneal and gastrocnemius veins when visible. The superficial great saphenous vein was also interrogated. Spectral Doppler was utilized to evaluate flow at rest and with distal augmentation maneuvers in the common femoral, femoral and popliteal veins. COMPARISON:  None. FINDINGS: RIGHT LOWER EXTREMITY Common Femoral Vein: No evidence of thrombus. Normal compressibility, respiratory phasicity and response to augmentation. Saphenofemoral Junction: No evidence of thrombus. Normal compressibility and flow on color Doppler imaging. Profunda Femoral Vein: No evidence of thrombus. Normal compressibility and flow on color Doppler imaging.  Femoral Vein: No evidence of thrombus. Normal compressibility, respiratory phasicity and response to augmentation. Popliteal Vein: No evidence of thrombus. Normal compressibility, respiratory phasicity and response to augmentation. Calf Veins: Calf veins are not well visualized due to the patient's body habitus Superficial Great Saphenous Vein: No evidence of thrombus. Normal compressibility. Venous Reflux:  None. Other Findings:  None. LEFT LOWER EXTREMITY Common Femoral Vein: No evidence of thrombus. Normal compressibility, respiratory phasicity and response to  augmentation. Saphenofemoral Junction: No evidence of thrombus. Normal compressibility and flow on color Doppler imaging. Profunda Femoral Vein: No evidence of thrombus. Normal compressibility and flow on color Doppler imaging. Femoral Vein: No evidence of thrombus. Normal compressibility, respiratory phasicity and response to augmentation. Popliteal Vein: No evidence of thrombus. Normal compressibility, respiratory phasicity and response to augmentation. Calf Veins: Calf veins are not well visualized due to the patient's body habitus. Superficial Great Saphenous Vein: No evidence of thrombus. Normal compressibility. Venous Reflux:  None. Other Findings:  None. IMPRESSION: No evidence of deep venous thrombosis in either lower extremity. Electronically Signed   By: Alcide Clever M.D.   On: 10/21/2019 10:24   DG Chest Port 1 View  Result Date: 10/19/2019 CLINICAL DATA:  Shortness of breath. COVID positive. EXAM: PORTABLE CHEST 1 VIEW COMPARISON:  October 19, 2019 (3:56 p.m.) FINDINGS: Persistent moderate to marked severity bilateral infiltrates are seen. Very mildly improved aeration of both lungs is noted. There is no evidence of a pleural effusion or pneumothorax. The cardiac silhouette is mildly enlarged and unchanged in size. The visualized skeletal structures are unremarkable. IMPRESSION: Persistent moderate to marked severity bilateral infiltrates. Electronically Signed   By: Aram Candela M.D.   On: 10/19/2019 18:37   ECHOCARDIOGRAM COMPLETE  Result Date: 10/20/2019    ECHOCARDIOGRAM REPORT   Patient Name:   Stedman [place]. Date of Exam: 10/20/2019 Medical Rec #:  [id number]          Height:       68.0 in Accession #:    [id number]         Weight:       350.0 lb Date of Birth:  October 17, 1975         BSA:          2.592 m Patient Age:    43 years           BP:           122/75 mmHg Patient Gender: M                  HR:           104 bpm. [facility] Procedure: 2D Echo, Cardiac Doppler  and Color Doppler Indications:    Pulmonary hypertension 416.8 / I27.2  History:        Patient has no prior history of Echocardiogram examinations.                 Risk Factors:Hypertension. Admit with Pneumonia due to COVID-19                 virus, Severe obesity, Acute pulmonary embolism.  Sonographer:    Celesta Gentile RCS Referring Phys: [id number] OLADAPO ADEFESO IMPRESSIONS  1. Left ventricular ejection fraction, by estimation, is 55 to 60%. The left ventricle has normal function. The left ventricle has no regional wall motion abnormalities. There is mild left ventricular hypertrophy. Left ventricular diastolic parameters are indeterminate.  2. Right ventricular systolic function is mildly reduced.  The right ventricular size is normal. Tricuspid regurgitation signal is inadequate for assessing PA pressure.  3. Left atrial size was moderately dilated.  4. The mitral valve is grossly normal. Trivial mitral valve regurgitation.  5. The aortic valve is tricuspid. Aortic valve regurgitation is not visualized.  6. The inferior vena cava is dilated in size with <50% respiratory variability, suggesting right atrial pressure of 15 mmHg. FINDINGS  Left Ventricle: Left ventricular ejection fraction, by estimation, is 55 to 60%. The left ventricle has normal function. The left ventricle has no regional wall motion abnormalities. The left ventricular internal cavity size was normal in size. There is  mild left ventricular hypertrophy. Left ventricular diastolic parameters are indeterminate. Right Ventricle: The right ventricular size is normal. No increase in right ventricular wall thickness. Right ventricular systolic function is mildly reduced. Tricuspid regurgitation signal is inadequate for assessing PA pressure. Left Atrium: Left atrial size was moderately dilated. Right Atrium: Right atrial size was normal in size. Pericardium: There is no evidence of pericardial effusion. Mitral Valve: The mitral valve is grossly  normal. Trivial mitral valve regurgitation. Tricuspid Valve: The tricuspid valve is grossly normal. Tricuspid valve regurgitation is trivial. Aortic Valve: The aortic valve is tricuspid. Aortic valve regurgitation is not visualized. Pulmonic Valve: The pulmonic valve was grossly normal. Pulmonic valve regurgitation is trivial. Aorta: The aortic root is normal in size and structure. Venous: The inferior vena cava is dilated in size with less than 50% respiratory variability, suggesting right atrial pressure of 15 mmHg. IAS/Shunts: No atrial level shunt detected by color flow Doppler.  LEFT VENTRICLE PLAX 2D LVIDd:         5.54 cm LVIDs:         3.86 cm LV PW:         1.27 cm LV IVS:        1.37 cm LVOT diam:     2.50 cm LV SV:         126 LV SV Index:   49 LVOT Area:     4.91 cm  RIGHT VENTRICLE TAPSE (M-mode): 3.6 cm LEFT ATRIUM              Index       RIGHT ATRIUM           Index LA diam:        4.80 cm  1.85 cm/m  RA Area:     20.30 cm LA Vol (A2C):   99.8 ml  38.50 ml/m RA Volume:   63.50 ml  24.49 ml/m LA Vol (A4C):   116.0 ml 44.74 ml/m LA Biplane Vol: 108.0 ml 41.66 ml/m  AORTIC VALVE LVOT Vmax:   126.00 cm/s LVOT Vmean:  76.600 cm/s LVOT VTI:    0.257 m  AORTA Ao Root diam: 3.70 cm MITRAL VALVE MV Area (PHT): 4.49 cm     SHUNTS MV Decel Time: 169 msec     Systemic VTI:  0.26 m MV E velocity: 141.00 cm/s  Systemic Diam: 2.50 cm Nona Dell MD Electronically signed by Nona Dell MD Signature Date/Time: 10/20/2019/3:49:54 PM    Final     Catarina Hartshorn, DO  [facility]  If 7PM-7AM, please contact night-coverage [email or web address] Password [email or web address] 11/01/2019, 6:09 PM   LOS: 2 days

## 2019-11-01 NOTE — Plan of Care (Signed)
  Problem: Education: Goal: Knowledge of General Education information will improve Description: Including pain rating scale, medication(s)/side effects and non-pharmacologic comfort measures Outcome: Progressing   Problem: Clinical Measurements: Goal: Will remain free from infection Outcome: Progressing Goal: Diagnostic test results will improve Outcome: Progressing   Problem: Elimination: Goal: Will not experience complications related to bowel motility Outcome: Progressing   Problem: Pain Managment: Goal: General experience of comfort will improve Outcome: Progressing   Problem: Skin Integrity: Goal: Risk for impaired skin integrity will decrease Outcome: Progressing   Problem: Clinical Measurements: Goal: Ability to maintain clinical measurements within normal limits will improve Outcome: Progressing

## 2019-11-01 NOTE — Progress Notes (Signed)
°  Subjective: Patient has minimal right gluteal pain and it only occurs when dressing changes are done.  Objective: Vital signs in last 24 hours: Temp:  [98 F (36.7 C)-98.6 F (37 C)] 98.6 F (37 C) (10/11 0543) Pulse Rate:  [96-108] 99 (10/11 0543) Resp:  [16-20] 20 (10/11 0543) BP: (122-146)/(69-96) 122/71 (10/11 0543) SpO2:  [97 %-100 %] 100 % (10/11 0543) Last BM Date: 10/31/19  Intake/Output from previous day: 10/10 0701 - 10/11 0700 In: 600 [P.O.:600] Out: -  Intake/Output this shift: No intake/output data recorded.  General appearance: alert, cooperative and no distress Right buttock wound healing well by secondary intention.  This does extend towards the anal verge, but no stool is seen.  I am unable to do a rectal examination at this time due to pain.  Good granulation tissue is present.  Lab Results:  Recent Labs    10/30/19 0406 10/31/19 0947  WBC 16.9* 14.0*  HGB 8.8* 8.3*  HCT 28.7* 27.8*  PLT 549* 473*   BMET Recent Labs    10/30/19 0406 10/31/19 0947  NA 138 136  K 3.2* 3.6  CL 100 99  CO2 28 27  GLUCOSE 140* 127*  BUN 9 10  CREATININE 0.94 0.90  CALCIUM 8.4* 8.7*   PT/INR No results for input(s): LABPROT, INR in the last 72 hours.  Studies/Results: No results found.  Anti-infectives: Anti-infectives (From admission, onward)   Start     Dose/Rate Route Frequency Ordered Stop   10/30/19 2000  vancomycin (VANCOREADY) IVPB 2000 mg/400 mL  Status:  Discontinued        2,000 mg 200 mL/hr over 120 Minutes Intravenous Every 12 hours 10/30/19 0742 10/30/19 1307   10/30/19 2000  vancomycin (VANCOREADY) IVPB 1250 mg/250 mL        1,250 mg 166.7 mL/hr over 90 Minutes Intravenous Every 12 hours 10/30/19 1307     10/30/19 1900  piperacillin-tazobactam (ZOSYN) IVPB 3.375 g        3.375 g 12.5 mL/hr over 240 Minutes Intravenous Every 8 hours 10/30/19 1230     10/30/19 1400  clindamycin (CLEOCIN) IVPB 600 mg  Status:  Discontinued        600  mg 100 mL/hr over 30 Minutes Intravenous Every 8 hours 10/30/19 1025 11/01/19 0736   10/30/19 1200  piperacillin-tazobactam (ZOSYN) IVPB 3.375 g  Status:  Discontinued        3.375 g 12.5 mL/hr over 240 Minutes Intravenous Every 8 hours 10/30/19 1145 10/30/19 1230   10/30/19 1115  piperacillin-tazobactam (ZOSYN) IVPB 3.375 g        3.375 g 100 mL/hr over 30 Minutes Intravenous  Once 10/30/19 1025 10/30/19 1129   10/30/19 1030  vancomycin (VANCOCIN) IVPB 1000 mg/200 mL premix  Status:  Discontinued        1,000 mg 200 mL/hr over 60 Minutes Intravenous  Once 10/30/19 1025 10/30/19 1026   10/30/19 0630  vancomycin (VANCOREADY) IVPB 2000 mg/400 mL        2,000 mg 200 mL/hr over 120 Minutes Intravenous  Once 10/30/19 2536 10/30/19 1230      Assessment/Plan: Impression: Right gluteal/perianal abscess, resolving.  No evidence of a fistula at this time. Plan: Continue current wound care.  Will need to be set up with home health for ongoing dressing changes upon discharge.  LOS: 2 days    Franky Macho 11/01/2019

## 2019-11-01 NOTE — TOC Initial Note (Signed)
Transition of Care (TOC) - Initial/Assessment Note    Patient Details  Name: Ricardo Lopez. MRN: [id number] Date of Birth: 1975/10/09  Transition [facility]) CM/SW Contact:    Villa Herb, LCSWA Phone Number: [phone] 11/01/2019, 1:56 PM  Clinical Narrative:                 Pt admitted abscess and cellulitis of gluteal region. Pt high risk for readmission. Pt lives at home with wife, daughter, and son. Pt states that he was able to complete his ADL's before he was admitted to the hospital. Pt states that he was able to drive before he got sick. Pt states that he has no previously had HH services. Pt has a cane at home but has not needed to use it. Pt states that he feels safe returning home upon discharge. When CSW asked pt if there were any needs pt passed the phone to his wife Shaune Westfall [phone]. Pts wife states that an MD had mentioned that the pt may need a HH RN to help with wound care. Pts wife states they do not have a preference of a HH company. CSW informed pt TOC will make referral to [facility] angency for [facility] RN. TOC to follow.   Expected Discharge Plan: Home w Home Health Services Barriers to Discharge: Continued Medical Work up   Patient Goals and CMS Choice Patient states their goals for this hospitalization and ongoing recovery are:: Home with [facility]   Choice offered to / list presented to : NA  Expected Discharge Plan and Services Expected Discharge Plan: Home w Home Health Services In-house Referral: Clinical Social Work Discharge Planning Services: NA Post Acute Care Choice: Home Health Living arrangements for the past 2 months: Single Family Home                 DME Arranged: N/A DME Agency: NA                  Prior Living Arrangements/Services Living arrangements for the past 2 months: Single Family Home Lives with:: Minor Children, Spouse Patient language and need for interpreter reviewed:: Yes Do you feel safe going back to the place  where you live?: Yes        Care giver support system in place?: Yes (comment) Antonini, Desiree (Spouse) [phone])   Criminal Activity/Legal Involvement Pertinent to Current Situation/Hospitalization: No - Comment as needed  Activities of Daily Living Home Assistive Devices/Equipment: Oxygen ADL Screening (condition at time of admission) Patient's cognitive ability adequate to safely complete daily activities?: Yes Is the patient deaf or have difficulty hearing?: No Does the patient have difficulty seeing, even when wearing glasses/contacts?: No Does the patient have difficulty concentrating, remembering, or making decisions?: No Patient able to express need for assistance with ADLs?: Yes Does the patient have difficulty dressing or bathing?: No Independently performs ADLs?: Yes (appropriate for developmental age) Does the patient have difficulty walking or climbing stairs?: No Weakness of Legs: None Weakness of Arms/Hands: None  Permission Sought/Granted                  Emotional Assessment Appearance:: Appears stated age Attitude/Demeanor/Rapport: Engaged Affect (typically observed): Accepting Orientation: : Oriented to Self, Oriented to Place, Oriented to  Time, Oriented to Situation Alcohol / Substance Use: Not Applicable Psych Involvement: No (comment)  Admission diagnosis:  Wound infection [T14.8XXA, L08.9] Abscess and cellulitis of gluteal region [L02.31, L03.317] Patient Active Problem List   Diagnosis Date Noted  .  Sepsis due to undetermined organism (HCC) 10/31/2019  . Chronic respiratory failure with hypoxia (HCC) 10/31/2019  . Abscess and cellulitis of gluteal region 10/30/2019  . Acute respiratory disease due to COVID-19 virus 10/22/2019  . E coli bacteremia 10/22/2019  . Hyperglycemia 10/19/2019  . Hypoalbuminemia 10/19/2019  . Pneumonia due to COVID-19 virus 09/29/2019  . Acute respiratory failure with hypoxia due to Covid  09/29/2019  .  Transaminitis 09/29/2019  . AKI (acute kidney injury) (HCC) 09/29/2019  . Microcytic anemia 09/29/2019  . Chronic pain syndrome 10/15/2012  . Osteochondral defect of ankle 05/26/2012  . Ankle arthritis 05/26/2012  . Right knee pain 05/26/2012  . Medial meniscus, posterior horn derangement 05/26/2012  . Severe obesity (BMI >= 40) (HCC)   . Hypertension    PCP:  [name], MD Pharmacy:   [facility] [phone] - [place], [place] - [address] [place] [address] Phone: [phone] Fax: [phone]     Social Determinants of Health (SDOH) Interventions    Readmission Risk Interventions Readmission Risk Prevention Plan 11/01/2019  Transportation Screening Complete  HRI or Home Care Consult Complete  Social Work Consult for Recovery Care Planning/Counseling Complete  Palliative Care Screening Not Applicable  Medication Review Oceanographer) Complete  Some recent data might be hidden

## 2019-11-02 DIAGNOSIS — A419 Sepsis, unspecified organism: Secondary | ICD-10-CM | POA: Diagnosis not present

## 2019-11-02 DIAGNOSIS — J9611 Chronic respiratory failure with hypoxia: Secondary | ICD-10-CM | POA: Diagnosis not present

## 2019-11-02 DIAGNOSIS — L0231 Cutaneous abscess of buttock: Secondary | ICD-10-CM | POA: Diagnosis not present

## 2019-11-02 LAB — CBC
HCT: 27.5 % — ABNORMAL LOW (ref 39.0–52.0)
Hemoglobin: 8 g/dL — ABNORMAL LOW (ref 13.0–17.0)
MCH: 19.9 pg — ABNORMAL LOW (ref 26.0–34.0)
MCHC: 29.1 g/dL — ABNORMAL LOW (ref 30.0–36.0)
MCV: 68.2 fL — ABNORMAL LOW (ref 80.0–100.0)
Platelets: 413 10*3/uL — ABNORMAL HIGH (ref 150–400)
RBC: 4.03 MIL/uL — ABNORMAL LOW (ref 4.22–5.81)
RDW: 17.2 % — ABNORMAL HIGH (ref 11.5–15.5)
WBC: 10.8 10*3/uL — ABNORMAL HIGH (ref 4.0–10.5)
nRBC: 0 % (ref 0.0–0.2)

## 2019-11-02 LAB — BASIC METABOLIC PANEL
Anion gap: 10 (ref 5–15)
BUN: 5 mg/dL — ABNORMAL LOW (ref 6–20)
CO2: 28 mmol/L (ref 22–32)
Calcium: 8.6 mg/dL — ABNORMAL LOW (ref 8.9–10.3)
Chloride: 101 mmol/L (ref 98–111)
Creatinine, Ser: 0.85 mg/dL (ref 0.61–1.24)
GFR, Estimated: >60 mL/min (ref >60)
Glucose, Bld: 140 mg/dL — ABNORMAL HIGH (ref 70–99)
Potassium: 3.6 mmol/L (ref 3.5–5.1)
Sodium: 139 mmol/L (ref 135–145)

## 2019-11-02 LAB — AEROBIC CULTURE W GRAM STAIN (SUPERFICIAL SPECIMEN)

## 2019-11-02 LAB — MAGNESIUM: Magnesium: 2 mg/dL (ref 1.7–2.4)

## 2019-11-02 NOTE — Progress Notes (Signed)
  Subjective: Patient only has discomfort with dressing changes.  Objective: Vital signs in last 24 hours: Temp:  [98.6 F (37 C)-99.1 F (37.3 C)] 98.6 F (37 C) (10/12 0509) Pulse Rate:  [88-103] 88 (10/12 0509) Resp:  [18-20] 20 (10/11 2138) BP: (124-135)/(80-86) 125/80 (10/12 0509) SpO2:  [97 %-100 %] 98 % (10/12 0509) Last BM Date: 10/31/19  Intake/Output from previous day: 10/11 0701 - 10/12 0700 In: 486 [P.O.:480; I.V.:6] Out: -  Intake/Output this shift: No intake/output data recorded.  General appearance: alert, cooperative and no distress Right buttock wound healing well by secondary intention.  No stool is present in the wound.  Granulation tissue noted at the base.  No significant purulent drainage present.  The left sided cellulitis of the gluteal fold seems improved.  Lab Results:  Recent Labs    10/31/19 0947  WBC 14.0*  HGB 8.3*  HCT 27.8*  PLT 473*   BMET Recent Labs    10/31/19 0947  NA 136  K 3.6  CL 99  CO2 27  GLUCOSE 127*  BUN 10  CREATININE 0.90  CALCIUM 8.7*   PT/INR No results for input(s): LABPROT, INR in the last 72 hours.  Studies/Results: No results found.  Anti-infectives: Anti-infectives (From admission, onward)   Start     Dose/Rate Route Frequency Ordered Stop   10/30/19 2000  vancomycin (VANCOREADY) IVPB 2000 mg/400 mL  Status:  Discontinued        2,000 mg 200 mL/hr over 120 Minutes Intravenous Every 12 hours 10/30/19 0742 10/30/19 1307   10/30/19 2000  vancomycin (VANCOREADY) IVPB 1250 mg/250 mL        1,250 mg 166.7 mL/hr over 90 Minutes Intravenous Every 12 hours 10/30/19 1307     10/30/19 1900  piperacillin-tazobactam (ZOSYN) IVPB 3.375 g        3.375 g 12.5 mL/hr over 240 Minutes Intravenous Every 8 hours 10/30/19 1230     10/30/19 1400  clindamycin (CLEOCIN) IVPB 600 mg  Status:  Discontinued        600 mg 100 mL/hr over 30 Minutes Intravenous Every 8 hours 10/30/19 1025 11/01/19 0736   10/30/19 1200   piperacillin-tazobactam (ZOSYN) IVPB 3.375 g  Status:  Discontinued        3.375 g 12.5 mL/hr over 240 Minutes Intravenous Every 8 hours 10/30/19 1145 10/30/19 1230   10/30/19 1115  piperacillin-tazobactam (ZOSYN) IVPB 3.375 g        3.375 g 100 mL/hr over 30 Minutes Intravenous  Once 10/30/19 1025 10/30/19 1129   10/30/19 1030  vancomycin (VANCOCIN) IVPB 1000 mg/200 mL premix  Status:  Discontinued        1,000 mg 200 mL/hr over 60 Minutes Intravenous  Once 10/30/19 1025 10/30/19 1026   10/30/19 0630  vancomycin (VANCOREADY) IVPB 2000 mg/400 mL        2,000 mg 200 mL/hr over 120 Minutes Intravenous  Once 10/30/19 1937 10/30/19 1230      Assessment/Plan: Impression: Resolving large right perirectal/gluteal abscess. Plan: Will arrange for home health to do dressing changes.  Would refer to wound care in [place] or [place].  Will discuss with Dr. Arbutus Leas discharge planning.  LOS: 3 days    Ricardo Lopez 11/02/2019

## 2019-11-02 NOTE — Progress Notes (Addendum)
PROGRESS NOTE  [name] ZOX:[id number] DOB: 16-Jan-1976 DOA: 10/30/2019 PCP: [name], MD  Brief History: 44 year old male with a history of hypertension, morbid obesity, diabetes mellitus type 2 presenting with increasing pain and drainage from his buttocks. The patient was recently admitted to the hospital from 10/19/2019 to 10/22/2019 secondary to respiratory failure as well as E. coli bacteremia. The patient was discharged home with cefdinir which he finished on 10/30/2019. It was felt that his respiratory failure was likely secondary to complications from his COVID-19 pneumonia in the setting of morbid obesity and OHS. He was discharged home on 2 L nasal cannula. In addition, the patient was also recently discharged after a stay from 09/29/2019 to 10/07/2019 after treatment for a COVID-19 pneumonia. The patient states that he had some gluteal pain even before he left the hospital on 10/22/2019. However, he has noted increasing edema and erythema and pain for the last 3 to 4 days. He is also noted some purulent drainage. He denied any fevers, chills, chest pain, shortness breath, cough, hemoptysis, vomiting, diarrhea, abdominal pain. In the emergency department, the patient had low-grade temperature of 9 9.1 F. He was mildly tachycardic up to 110. He was hemodynamically stable. Oxygen saturation was 97% on room air. The patient was started on vancomycin and Zosyn. General surgery was consulted to assist with management.  Assessment/Plan: Sepsis -Present on admission -Secondary to gluteal abscess -Presented with tachycardia and WBC up to 16.9 -Lactic acid 1.6 -Follow cultures--VRE+Ecoli -Continue vancomycin and Zosyn  Gluteal abscess -Appreciate surgical consult -Case discussed with Dr. Jorja Loa suspicion of fistula at this time-->treat as gluteal abscess with outpatient follow-up and consideration for MRI as an outpatient if clinically worsens or  no improvement -Continue vancomycin and Zosyn -WBC decreasing 16.9>>14.0>>10.8 -NON-surgical setting cultures = VRE and Ecoli -Doubt VRE is true pathogen as patient is improving and WBC improving without active tx against VRE -continue vanco/zosyn for now -if continues to improve, could likely go home with doxy+amox/clav x 10 more days -will need HHRN to help with dressing changes  Controlled diabetes mellitus type 2 -10/19/2019 hemoglobin A1c 7.1 -NovoLog sliding scale  Morbid obesity -BMI 60.82 -Lifestyle modification  Chronic respiratory failure with hypoxia -Stable on 2 L nasal cannula -Encourage incentive spirometry  Iron deficiency anemia -10/20/2019 iron saturation 7% -Ferritin 289 -start iron once infx resolved      Status is: Inpatient  Remains inpatient appropriate because:IV treatments appropriate due to intensity of illness or inability to take PO   Dispo: The patient is from:Home Anticipated d/c is WJ:XBJY Anticipated d/c date is: 10/13 or 10/14 Patient currently is not medically stable to d/c.        Family Communication:spouse updated 10/11  Consultants:General surgery  Code Status: FULL  DVT Prophylaxis: Conner Lovenox   Procedures: As Listed in Progress Note Above  Antibiotics: vanco 10/9>> Zosyn 10/9>>      Subjective: Patient states buttock is starting to feel better.  Patient denies fevers, chills, headache, chest pain, dyspnea, nausea, vomiting, diarrhea, abdominal pain, dysuria, hematuria, hematochezia, and melena.   Objective: Vitals:   11/01/19 2138 11/02/19 0509 11/02/19 1048 11/02/19 1350  BP: 135/83 125/80 114/82 127/88  Pulse: 95 88 82 (!) 106  Resp: 20  19 18   Temp: 99.1 F (37.3 C) 98.6 F (37 C) 98.6 F (37 C) 98.6 F (37 C)  TempSrc: Oral Oral Oral Oral  SpO2: 100% 98% 98% 95%  Weight:  Height:        Intake/Output Summary (Last  24 hours) at 11/02/2019 1748 Last data filed at 11/02/2019 1015 Gross per 24 hour  Intake 3 ml  Output --  Net 3 ml   Weight change:  Exam:   General:  Pt is alert, follows commands appropriately, not in acute distress  HEENT: No icterus, No thrush, No neck mass, Leslie/AT  Cardiovascular: RRR, S1/S2, no rubs, no gallops  Respiratory: fine bibasilar crackles. No wheeze  Abdomen: Soft/+BS, non tender, non distended, no guarding  Extremities: Non pitting edema, No lymphangitis, No petechiae, No rashes, no synovitis;  Buttock with induration L>R without necrosis   Data Reviewed: I have personally reviewed following labs and imaging studies Basic Metabolic Panel: Recent Labs  Lab 10/30/19 0406 10/31/19 0947 11/02/19 0941  NA 138 136 139  K 3.2* 3.6 3.6  CL 100 99 101  CO2 28 27 28   GLUCOSE 140* 127* 140*  BUN 9 10 5*  CREATININE 0.94 0.90 0.85  CALCIUM 8.4* 8.7* 8.6*  MG  --   --  2.0   Liver Function Tests: Recent Labs  Lab 10/30/19 0406  AST 36  ALT 57*  ALKPHOS 90  BILITOT 0.6  PROT 7.6  ALBUMIN 2.3*   No results for input(s): LIPASE, AMYLASE in the last 168 hours. No results for input(s): AMMONIA in the last 168 hours. Coagulation Profile: No results for input(s): INR, PROTIME in the last 168 hours. CBC: Recent Labs  Lab 10/30/19 0406 10/31/19 0947 11/02/19 0941  WBC 16.9* 14.0* 10.8*  HGB 8.8* 8.3* 8.0*  HCT 28.7* 27.8* 27.5*  MCV 66.4* 67.1* 68.2*  PLT 549* 473* 413*   Cardiac Enzymes: No results for input(s): CKTOTAL, CKMB, CKMBINDEX, TROPONINI in the last 168 hours. BNP: Invalid input(s): POCBNP CBG: No results for input(s): GLUCAP in the last 168 hours. HbA1C: No results for input(s): HGBA1C in the last 72 hours. Urine analysis: No results found for: COLORURINE, APPEARANCEUR, LABSPEC, PHURINE, GLUCOSEU, HGBUR, BILIRUBINUR, KETONESUR, PROTEINUR, UROBILINOGEN, NITRITE, LEUKOCYTESUR Sepsis  Labs: @LABRCNTIP (procalcitonin:4,lacticidven:4) ) Recent Results (from the past 240 hour(s))  Aerobic Culture (superficial specimen)     Status: None   Collection Time: 10/30/19  3:56 AM   Specimen: Wound  Result Value Ref Range Status   Specimen Description WOUND  Final   Special Requests BUTTOCKS  Final   Gram Stain   Final    FEW WBC PRESENT,BOTH PMN AND MONONUCLEAR ABUNDANT GRAM NEGATIVE RODS ABUNDANT GRAM POSITIVE COCCI IN PAIRS Performed at [facility], [address] [place], [place] [address]    Culture   Final    MODERATE ESCHERICHIA COLI FEW VANCOMYCIN RESISTANT ENTEROCOCCUS ISOLATED    Report Status 11/02/2019 FINAL  Final   Organism ID, Bacteria ESCHERICHIA COLI  Final   Organism ID, Bacteria VANCOMYCIN RESISTANT ENTEROCOCCUS ISOLATED  Final      Susceptibility   Escherichia coli - MIC*    AMPICILLIN <=2 SENSITIVE Sensitive     CEFAZOLIN <=4 SENSITIVE Sensitive     CEFEPIME <=0.12 SENSITIVE Sensitive     CEFTAZIDIME <=1 SENSITIVE Sensitive     CEFTRIAXONE <=0.25 SENSITIVE Sensitive     CIPROFLOXACIN <=0.25 SENSITIVE Sensitive     GENTAMICIN <=1 SENSITIVE Sensitive     IMIPENEM <=0.25 SENSITIVE Sensitive     TRIMETH/SULFA <=20 SENSITIVE Sensitive     AMPICILLIN/SULBACTAM <=2 SENSITIVE Sensitive     PIP/TAZO <=4 SENSITIVE Sensitive     * MODERATE ESCHERICHIA COLI   Vancomycin  resistant enterococcus isolated - MIC*    AMPICILLIN >=32 RESISTANT Resistant     VANCOMYCIN >=32 RESISTANT Resistant     GENTAMICIN SYNERGY SENSITIVE Sensitive     LINEZOLID 2 SENSITIVE Sensitive     * FEW VANCOMYCIN RESISTANT ENTEROCOCCUS ISOLATED  Aerobic/Anaerobic Culture (surgical/deep wound)     Status: None (Preliminary result)   Collection Time: 10/30/19  8:16 PM   Specimen: Buttocks; Abscess  Result Value Ref Range Status   Specimen Description   Final    BUTTOCKS Performed at [facility], [address], [place], [place] [address]    Special Requests   Final     Normal Performed at [facility], [address] [place], [place] [address]    Gram Stain   Final    RARE WBC PRESENT, PREDOMINANTLY PMN FEW GRAM POSITIVE COCCI RARE GRAM NEGATIVE RODS    Culture   Final    FEW ESCHERICHIA COLI HOLDING FOR POSSIBLE ANAEROBE Performed at [facility], [address], [place], [place] [address]    Report Status PENDING  Incomplete   Organism ID, Bacteria ESCHERICHIA COLI  Final      Susceptibility   Escherichia coli - MIC*    AMPICILLIN <=2 SENSITIVE Sensitive     CEFAZOLIN <=4 SENSITIVE Sensitive     CEFEPIME <=0.12 SENSITIVE Sensitive     CEFTAZIDIME <=1 SENSITIVE Sensitive     CEFTRIAXONE <=0.25 SENSITIVE Sensitive     CIPROFLOXACIN <=0.25 SENSITIVE Sensitive     GENTAMICIN <=1 SENSITIVE Sensitive     IMIPENEM <=0.25 SENSITIVE Sensitive     TRIMETH/SULFA <=20 SENSITIVE Sensitive     AMPICILLIN/SULBACTAM <=2 SENSITIVE Sensitive     PIP/TAZO <=4 SENSITIVE Sensitive     * FEW ESCHERICHIA COLI     Scheduled Meds: . ascorbic acid  500 mg Oral Daily  . enoxaparin (LOVENOX) injection  80 mg Subcutaneous Q24H  . multivitamin with minerals  1 tablet Oral Daily  . pantoprazole  40 mg Oral Daily  . sodium chloride flush  3 mL Intravenous Q12H  . sodium chloride flush  3 mL Intravenous Q12H  . zinc sulfate  220 mg Oral Daily   Continuous Infusions: . sodium chloride    . piperacillin-tazobactam (ZOSYN)  IV 3.375 g (11/02/19 1211)  . vancomycin 1,250 mg (11/02/19 1019)    Procedures/Studies: DG Chest 2 View  Result Date: 10/19/2019 CLINICAL DATA:  Persistent cough post discharge with COVID. EXAM: CHEST - 2 VIEW COMPARISON:  09/29/2019 FINDINGS: Extensive bilateral airspace opacities. No visible pleural effusions or pneumothorax. Cardiac silhouette is enlarged and accentuated by low lung volumes. No evidence of acute osseous abnormality. IMPRESSION: 1. Extensive bilateral airspace opacities, compatible with multifocal pneumonia. 2. Suspected  cardiomegaly. Electronically Signed   By: Feliberto Harts MD   On: 10/19/2019 16:24   CT Angio Chest PE W and/or Wo Contrast  Result Date: 10/19/2019 CLINICAL DATA:  Cough and shortness of breath. COVID-19 virus infection. EXAM: CT ANGIOGRAPHY CHEST WITH CONTRAST TECHNIQUE: Multidetector CT imaging of the chest was performed using the standard protocol during bolus administration of intravenous contrast. Multiplanar CT image reconstructions and MIPs were obtained to evaluate the vascular anatomy. Study was performed twice due to suboptimal opacification of pulmonary arteries. CONTRAST:  OMNIPAQUE IOHEXOL 350 MG/ML SOLN COMPARISON:  10/03/2019 FINDINGS: Cardiovascular: Suboptimal contrast opacification of the pulmonary arteries is demonstrated on both scans, likely due to contrast injection triggering being compromised by patient breathing during the exam, as well as pulmonary  airspace disease. No large central pulmonary emboli are identified, however pulmonary emboli in sub lobar pulmonary arteries cannot be excluded on this exam. Mediastinum/Nodes: No masses or pathologically enlarged lymph nodes identified. Lungs/Pleura: Heterogeneous airspace opacity is again seen throughout both lungs, without significant change since prior exam. No evidence of parenchymal cavitation or pleural effusion. Upper abdomen: No acute findings. Musculoskeletal: No suspicious bone lesions identified. Review of the MIP images confirms the above findings. IMPRESSION: Suboptimal contrast opacification of pulmonary arteries. No large central pulmonary emboli identified, however pulmonary emboli in sub-lobar pulmonary arteries cannot be excluded. No significant change in severe bilateral pulmonary airspace opacity, consistent with viral pneumonia. Electronically Signed   By: Danae Orleans M.D.   On: 10/19/2019 19:45   CT PELVIS W CONTRAST  Result Date: 10/30/2019 CLINICAL DATA:  Draining wound on buttocks after trauma several  days ago. EXAM: CT PELVIS WITH CONTRAST TECHNIQUE: Multidetector CT imaging of the pelvis was performed using the standard protocol following the bolus administration of intravenous contrast. CONTRAST:  OMNIPAQUE IOHEXOL 300 MG/ML  SOLN COMPARISON:  None. FINDINGS: Mild limitations secondary to patient body habitus. Urinary Tract:  Normal urinary bladder.  No distal hydroureter. Bowel: Suggestion of pelvic floor laxity/descent with possible rectal prolapse, including on 47/4. Otherwise normal pelvic bowel loops. Vascular/Lymphatic: No pelvic aneurysm. Pelvic sidewall adenopathy, most significant in the right external iliac station of 1.6 cm on 25/4. Right inguinal node measures 1.5 cm on 44/4. Reproductive:  Normal prostate. Other: No significant free fluid. Tiny fat containing right inguinal hernia. Edema is identified about the gluteal crease bilaterally, including on 47/4. There are areas of subcutaneous gas on the right with skin irregularity including on 59/4. Minimal left gas on 42/4. no well-defined fluid collection to suggest drainable abscess. Musculoskeletal: No acute osseous abnormality. IMPRESSION: 1. Right greater than left edema and subcutaneous gas about the gluteal creases. Likely indicative of cellulitis. If perianal/perirectal fistula is a concern, the test of choice is high-resolution pre and post-contrast pelvic MRI. Rectal prolapse 2. Suggestion of pelvic floor descent with possible. 3. Pelvic adenopathy, most likely reactive. This could be re-evaluated with pelvic CT follow-up at 3-6 months. 4. Mild degradation secondary to patient body habitus. Electronically Signed   By: Jeronimo Greaves M.D.   On: 10/30/2019 09:34   US Venous Img Lower Bilateral (DVT)  Result Date: 10/21/2019 CLINICAL DATA:  Shortness of breath, COVID-19 positivity, possible deep venous thrombosis. EXAM: BILATERAL LOWER EXTREMITY VENOUS DOPPLER ULTRASOUND TECHNIQUE: Gray-scale sonography with graded compression, as  well as color Doppler and duplex ultrasound were performed to evaluate the lower extremity deep venous systems from the level of the common femoral vein and including the common femoral, femoral, profunda femoral, popliteal and calf veins including the posterior tibial, peroneal and gastrocnemius veins when visible. The superficial great saphenous vein was also interrogated. Spectral Doppler was utilized to evaluate flow at rest and with distal augmentation maneuvers in the common femoral, femoral and popliteal veins. COMPARISON:  None. FINDINGS: RIGHT LOWER EXTREMITY Common Femoral Vein: No evidence of thrombus. Normal compressibility, respiratory phasicity and response to augmentation. Saphenofemoral Junction: No evidence of thrombus. Normal compressibility and flow on color Doppler imaging. Profunda Femoral Vein: No evidence of thrombus. Normal compressibility and flow on color Doppler imaging. Femoral Vein: No evidence of thrombus. Normal compressibility, respiratory phasicity and response to augmentation. Popliteal Vein: No evidence of thrombus. Normal compressibility, respiratory phasicity and response to augmentation. Calf Veins: Calf veins are not well visualized due to the patient's  body habitus Superficial Great Saphenous Vein: No evidence of thrombus. Normal compressibility. Venous Reflux:  None. Other Findings:  None. LEFT LOWER EXTREMITY Common Femoral Vein: No evidence of thrombus. Normal compressibility, respiratory phasicity and response to augmentation. Saphenofemoral Junction: No evidence of thrombus. Normal compressibility and flow on color Doppler imaging. Profunda Femoral Vein: No evidence of thrombus. Normal compressibility and flow on color Doppler imaging. Femoral Vein: No evidence of thrombus. Normal compressibility, respiratory phasicity and response to augmentation. Popliteal Vein: No evidence of thrombus. Normal compressibility, respiratory phasicity and response to augmentation. Calf  Veins: Calf veins are not well visualized due to the patient's body habitus. Superficial Great Saphenous Vein: No evidence of thrombus. Normal compressibility. Venous Reflux:  None. Other Findings:  None. IMPRESSION: No evidence of deep venous thrombosis in either lower extremity. Electronically Signed   By: Alcide Clever M.D.   On: 10/21/2019 10:24   DG Chest Port 1 View  Result Date: 10/19/2019 CLINICAL DATA:  Shortness of breath. COVID positive. EXAM: PORTABLE CHEST 1 VIEW COMPARISON:  October 19, 2019 (3:56 p.m.) FINDINGS: Persistent moderate to marked severity bilateral infiltrates are seen. Very mildly improved aeration of both lungs is noted. There is no evidence of a pleural effusion or pneumothorax. The cardiac silhouette is mildly enlarged and unchanged in size. The visualized skeletal structures are unremarkable. IMPRESSION: Persistent moderate to marked severity bilateral infiltrates. Electronically Signed   By: Aram Candela M.D.   On: 10/19/2019 18:37   ECHOCARDIOGRAM COMPLETE  Result Date: 10/20/2019    ECHOCARDIOGRAM REPORT   Patient Name:   Ricardo Lopez. Date of Exam: 10/20/2019 Medical Rec #:  [id number]          Height:       68.0 in Accession #:    [id number]         Weight:       350.0 lb Date of Birth:  1975/10/22         BSA:          2.592 m Patient Age:    43 years           BP:           122/75 mmHg Patient Gender: M                  HR:           104 bpm. [facility] Procedure: 2D Echo, Cardiac Doppler and Color Doppler Indications:    Pulmonary hypertension 416.8 / I27.2  History:        Patient has no prior history of Echocardiogram examinations.                 Risk Factors:Hypertension. Admit with Pneumonia due to COVID-19                 virus, Severe obesity, Acute pulmonary embolism.  Sonographer:    Celesta Gentile RCS Referring Phys: [id number] OLADAPO ADEFESO IMPRESSIONS  1. Left ventricular ejection fraction, by estimation, is 55 to 60%. The left  ventricle has normal function. The left ventricle has no regional wall motion abnormalities. There is mild left ventricular hypertrophy. Left ventricular diastolic parameters are indeterminate.  2. Right ventricular systolic function is mildly reduced. The right ventricular size is normal. Tricuspid regurgitation signal is inadequate for assessing PA pressure.  3. Left atrial size was moderately dilated.  4. The mitral valve is grossly normal. Trivial mitral valve regurgitation.  5. The aortic valve  is tricuspid. Aortic valve regurgitation is not visualized.  6. The inferior vena cava is dilated in size with <50% respiratory variability, suggesting right atrial pressure of 15 mmHg. FINDINGS  Left Ventricle: Left ventricular ejection fraction, by estimation, is 55 to 60%. The left ventricle has normal function. The left ventricle has no regional wall motion abnormalities. The left ventricular internal cavity size was normal in size. There is  mild left ventricular hypertrophy. Left ventricular diastolic parameters are indeterminate. Right Ventricle: The right ventricular size is normal. No increase in right ventricular wall thickness. Right ventricular systolic function is mildly reduced. Tricuspid regurgitation signal is inadequate for assessing PA pressure. Left Atrium: Left atrial size was moderately dilated. Right Atrium: Right atrial size was normal in size. Pericardium: There is no evidence of pericardial effusion. Mitral Valve: The mitral valve is grossly normal. Trivial mitral valve regurgitation. Tricuspid Valve: The tricuspid valve is grossly normal. Tricuspid valve regurgitation is trivial. Aortic Valve: The aortic valve is tricuspid. Aortic valve regurgitation is not visualized. Pulmonic Valve: The pulmonic valve was grossly normal. Pulmonic valve regurgitation is trivial. Aorta: The aortic root is normal in size and structure. Venous: The inferior vena cava is dilated in size with less than 50%  respiratory variability, suggesting right atrial pressure of 15 mmHg. IAS/Shunts: No atrial level shunt detected by color flow Doppler.  LEFT VENTRICLE PLAX 2D LVIDd:         5.54 cm LVIDs:         3.86 cm LV PW:         1.27 cm LV IVS:        1.37 cm LVOT diam:     2.50 cm LV SV:         126 LV SV Index:   49 LVOT Area:     4.91 cm  RIGHT VENTRICLE TAPSE (M-mode): 3.6 cm LEFT ATRIUM              Index       RIGHT ATRIUM           Index LA diam:        4.80 cm  1.85 cm/m  RA Area:     20.30 cm LA Vol (A2C):   99.8 ml  38.50 ml/m RA Volume:   63.50 ml  24.49 ml/m LA Vol (A4C):   116.0 ml 44.74 ml/m LA Biplane Vol: 108.0 ml 41.66 ml/m  AORTIC VALVE LVOT Vmax:   126.00 cm/s LVOT Vmean:  76.600 cm/s LVOT VTI:    0.257 m  AORTA Ao Root diam: 3.70 cm MITRAL VALVE MV Area (PHT): 4.49 cm     SHUNTS MV Decel Time: 169 msec     Systemic VTI:  0.26 m MV E velocity: 141.00 cm/s  Systemic Diam: 2.50 cm Nona Dell MD Electronically signed by Nona Dell MD Signature Date/Time: 10/20/2019/3:49:54 PM    Final     Catarina Hartshorn, DO  [facility]  If 7PM-7AM, please contact night-coverage [email or web address] Password [email or web address] 11/02/2019, 5:48 PM   LOS: 3 days

## 2019-11-03 DIAGNOSIS — L0231 Cutaneous abscess of buttock: Secondary | ICD-10-CM | POA: Diagnosis not present

## 2019-11-03 DIAGNOSIS — D509 Iron deficiency anemia, unspecified: Secondary | ICD-10-CM

## 2019-11-03 DIAGNOSIS — J9601 Acute respiratory failure with hypoxia: Secondary | ICD-10-CM | POA: Diagnosis not present

## 2019-11-03 DIAGNOSIS — A419 Sepsis, unspecified organism: Secondary | ICD-10-CM | POA: Diagnosis not present

## 2019-11-03 DIAGNOSIS — L03317 Cellulitis of buttock: Secondary | ICD-10-CM

## 2019-11-03 LAB — AEROBIC/ANAEROBIC CULTURE W GRAM STAIN (SURGICAL/DEEP WOUND): Special Requests: NORMAL

## 2019-11-03 MED ORDER — TRAMADOL HCL 50 MG PO TABS: 50.0000 mg | ORAL_TABLET | Freq: Four times a day (QID) | ORAL | 0 refills | Status: DC | PRN

## 2019-11-03 MED ORDER — AMOXICILLIN-POT CLAVULANATE 875-125 MG PO TABS: 1.0000 | ORAL_TABLET | Freq: Two times a day (BID) | ORAL | 0 refills | Status: AC

## 2019-11-03 MED ORDER — DOXYCYCLINE HYCLATE 100 MG PO CAPS: 100.0000 mg | ORAL_CAPSULE | Freq: Two times a day (BID) | ORAL | 0 refills | Status: DC

## 2019-11-03 NOTE — Progress Notes (Signed)
Arrangements being made for home health wound care as well as referral to wound care for ongoing management.  I will be more than happy to follow the patient on an outpatient basis.  Discussed with Dr. Kerry Hough.  Would continue treatment for E. coli and vancomycin-resistant Enterococcus as able.  Would do a total of 10 days.

## 2019-11-03 NOTE — Progress Notes (Signed)
Nsg Discharge Note  Admit Date:  10/30/2019 Discharge date: 11/03/2019   [name] to be D/C'd Home per MD order.  AVS completed.  Copy for chart, and copy for patient signed, and dated. Patient/caregiver able to verbalize understanding.  Discharge Medication: Allergies as of 11/03/2019   No Known Allergies     Medication List    STOP taking these medications   predniSONE 20 MG tablet Commonly known as: Deltasone     TAKE these medications   acetaminophen 325 MG tablet Commonly known as: TYLENOL Take 650 mg by mouth every 6 (six) hours as needed.   albuterol 108 (90 Base) MCG/ACT inhaler Commonly known as: VENTOLIN HFA Inhale 2 puffs into the lungs every 4 (four) hours as needed for wheezing or shortness of breath.   amoxicillin-clavulanate 875-125 MG tablet Commonly known as: Augmentin Take 1 tablet by mouth 2 (two) times daily for 10 days.   ascorbic acid 500 MG tablet Commonly known as: VITAMIN C Take 1 tablet (500 mg total) by mouth daily.   chlorpheniramine-HYDROcodone 10-8 MG/5ML Suer Commonly known as: TUSSIONEX Take 5 mLs by mouth every 12 (twelve) hours as needed for cough.   doxycycline 100 MG capsule Commonly known as: VIBRAMYCIN Take 1 capsule (100 mg total) by mouth 2 (two) times daily.   guaiFENesin-dextromethorphan 100-10 MG/5ML syrup Commonly known as: ROBITUSSIN DM Take 10 mLs by mouth every 4 (four) hours as needed for cough.   hydrocortisone 25 MG suppository Commonly known as: ANUSOL-HC Place 1 suppository (25 mg total) rectally 2 (two) times daily.   multivitamin tablet Take 1 tablet by mouth daily.   pantoprazole 40 MG tablet Commonly known as: PROTONIX Take 1 tablet (40 mg total) by mouth daily.   polyethylene glycol 17 g packet Commonly known as: MIRALAX / GLYCOLAX Take 17 g by mouth daily.   senna-docusate 8.6-50 MG tablet Commonly known as: Senokot-S Take 2 tablets by mouth at bedtime.   traMADol 50 MG  tablet Commonly known as: Ultram Take 1 tablet (50 mg total) by mouth every 6 (six) hours as needed.   zinc sulfate 220 (50 Zn) MG capsule Take 1 capsule (220 mg total) by mouth daily.            Discharge Care Instructions  (From admission, onward)         Start     Ordered   11/03/19 0000  Discharge wound care:       Comments: Irrigate right buttock wound with NS, then pack wet to dry daily   11/03/19 1723          Discharge Assessment: Vitals:   11/02/19 2044 11/03/19 0453  BP: (!) 107/58 132/72  Pulse: (!) 108 81  Resp: 20 20  Temp:  98.5 F (36.9 C)  SpO2: 96% 100%   Skin clean, dry and intact without evidence of skin break down, no evidence of skin tears noted. IV catheter discontinued intact. Site without signs and symptoms of complications - no redness or edema noted at insertion site, patient denies c/o pain - only slight tenderness at site.  Dressing with slight pressure applied.  D/c Instructions-Education: Discharge instructions given to patient/family with verbalized understanding. D/c education completed with patient/family including follow up instructions, medication list, d/c activities limitations if indicated, with other d/c instructions as indicated by MD - patient able to verbalize understanding, all questions fully answered. Patient instructed to return to ED, call 911, or call MD for any changes in condition.  Patient escorted via WC, and D/C home via private auto.  Brandy Hale, RN 11/03/2019 7:02 PM  Nsg Discharge Note  Admit Date:  10/30/2019 Discharge date: 11/03/2019   [name] to be D/C'd Home per MD order.  AVS completed.  Copy for chart, and copy for patient signed, and dated. Patient/caregiver able to verbalize understanding.  Discharge Medication: Allergies as of 11/03/2019   No Known Allergies     Medication List    STOP taking these medications   predniSONE 20 MG tablet Commonly known as: Deltasone      TAKE these medications   acetaminophen 325 MG tablet Commonly known as: TYLENOL Take 650 mg by mouth every 6 (six) hours as needed.   albuterol 108 (90 Base) MCG/ACT inhaler Commonly known as: VENTOLIN HFA Inhale 2 puffs into the lungs every 4 (four) hours as needed for wheezing or shortness of breath.   amoxicillin-clavulanate 875-125 MG tablet Commonly known as: Augmentin Take 1 tablet by mouth 2 (two) times daily for 10 days.   ascorbic acid 500 MG tablet Commonly known as: VITAMIN C Take 1 tablet (500 mg total) by mouth daily.   chlorpheniramine-HYDROcodone 10-8 MG/5ML Suer Commonly known as: TUSSIONEX Take 5 mLs by mouth every 12 (twelve) hours as needed for cough.   doxycycline 100 MG capsule Commonly known as: VIBRAMYCIN Take 1 capsule (100 mg total) by mouth 2 (two) times daily.   guaiFENesin-dextromethorphan 100-10 MG/5ML syrup Commonly known as: ROBITUSSIN DM Take 10 mLs by mouth every 4 (four) hours as needed for cough.   hydrocortisone 25 MG suppository Commonly known as: ANUSOL-HC Place 1 suppository (25 mg total) rectally 2 (two) times daily.   multivitamin tablet Take 1 tablet by mouth daily.   pantoprazole 40 MG tablet Commonly known as: PROTONIX Take 1 tablet (40 mg total) by mouth daily.   polyethylene glycol 17 g packet Commonly known as: MIRALAX / GLYCOLAX Take 17 g by mouth daily.   senna-docusate 8.6-50 MG tablet Commonly known as: Senokot-S Take 2 tablets by mouth at bedtime.   traMADol 50 MG tablet Commonly known as: Ultram Take 1 tablet (50 mg total) by mouth every 6 (six) hours as needed.   zinc sulfate 220 (50 Zn) MG capsule Take 1 capsule (220 mg total) by mouth daily.            Discharge Care Instructions  (From admission, onward)         Start     Ordered   11/03/19 0000  Discharge wound care:       Comments: Irrigate right buttock wound with NS, then pack wet to dry daily   11/03/19 1723          Discharge  Assessment: Vitals:   11/02/19 2044 11/03/19 0453  BP: (!) 107/58 132/72  Pulse: (!) 108 81  Resp: 20 20  Temp:  98.5 F (36.9 C)  SpO2: 96% 100%   Skin clean, dry and intact without evidence of skin break down, no evidence of skin tears noted. IV catheter discontinued intact. Site without signs and symptoms of complications - no redness or edema noted at insertion site, patient denies c/o pain - only slight tenderness at site.  Dressing with slight pressure applied.  D/c Instructions-Education: Discharge instructions given to patient/family with verbalized understanding. D/c education completed with patient/family including follow up instructions, medication list, d/c activities limitations if indicated, with other d/c instructions as indicated by MD - patient able to verbalize understanding,  all questions fully answered. Patient instructed to return to ED, call 911, or call MD for any changes in condition.  Patient escorted via WC, and D/C home via private auto.  Brandy Hale, LPN 25/42/7062 3:76 PM

## 2019-11-03 NOTE — Discharge Summary (Signed)
Physician Discharge Summary  [name] WUJ:[id number] DOB: 09/06/1975 DOA: 10/30/2019  PCP: [name], MD  Admit date: 10/30/2019 Discharge date: 11/03/2019  Admitted From: Home Disposition: Home  Recommendations for Outpatient Follow-up:  1. Follow up with PCP in 1-2 weeks 2. Please obtain BMP/CBC in one week 3. Follow-up with general surgery as needed 4. He has been referred to outpatient physical therapy for wound care  Home Health: Home health RN Equipment/Devices:  Discharge Condition: Stable CODE STATUS: Full code Diet recommendation: Heart healthy  Brief/Interim Summary: 44 year old male with a history of hypertension, morbid obesity, diabetes mellitus type 2 presenting with increasing pain and drainage from his buttocks. The patient was recently admitted to the hospital from 10/19/2019 to 10/22/2019 secondary to respiratory failure as well as E. coli bacteremia. The patient was discharged home with cefdinir which he finished on 10/30/2019. It was felt that his respiratory failure was likely secondary to complications from his COVID-19 pneumonia in the setting of morbid obesity and OHS. He was discharged home on 2 L nasal cannula. In addition, the patient was also recently discharged after a stay from 09/29/2019 to 10/07/2019 after treatment for a COVID-19 pneumonia. The patient states that he had some gluteal pain even before he left the hospital on 10/22/2019. However, he has noted increasing edema and erythema and pain for the last 3 to 4 days. He is also noted some purulent drainage. He denied any fevers, chills, chest pain, shortness breath, cough, hemoptysis, vomiting, diarrhea, abdominal pain. In the emergency department, the patient had low-grade temperature of 9 9.1 F. He was mildly tachycardic up to 110. He was hemodynamically stable. Oxygen saturation was 97% on room air. The patient was started on vancomycin and Zosyn. General surgery was consulted  to assist with management.  Discharge Diagnoses:  Principal Problem:   Abscess and cellulitis of gluteal region Active Problems:   Severe obesity (BMI >= 40) (HCC)   Acute respiratory failure with hypoxia due to Covid    Microcytic anemia   Sepsis due to undetermined organism (HCC)   Chronic respiratory failure with hypoxia (HCC)  Sepsis -Present on admission -Secondary to gluteal abscess -Presented with tachycardia and WBC up to 16.9 -Lactic acid 1.6 -Follow cultures--VRE+Ecoli -Continue vancomycin and Zosyn  Gluteal abscess -Appreciate surgical consult -Case discussed with Dr. Jorja Loa suspicion of fistula at this time-->treat as gluteal abscess with outpatient follow-up and consideration for MRI as an outpatient if clinically worsens or no improvement -Continue vancomycin and Zosyn -WBC decreasing 16.9>>14.0>>10.8 -NON-surgical setting cultures = VRE and Ecoli -Doubt VRE is true pathogen as patient is improving and WBC improving without active tx against VRE -Treated with vancomycin/Zosyn -Since he is clinically improved, antibiotics transition to doxycycline/Augmentin -He has been referred for outpatient wound care with physical therapy -will need HHRN to help with dressing changes  Controlled diabetes mellitus type 2 -10/19/2019 hemoglobin A1c 7.1 -NovoLog sliding scale  Morbid obesity -BMI 60.82 -Lifestyle modification  Chronic respiratory failure with hypoxia -Stable on 2 L nasal cannula -Encourage incentive spirometry  Iron deficiency anemia -10/20/2019 iron saturation 7% -Ferritin 289 -start iron once infx resolved, as an outpatient  Discharge Instructions  Discharge Instructions    Ambulatory referral to Physical Therapy   Complete by: As directed    Wound care, gluteal wound   Diet - low sodium heart healthy   Complete by: As directed    Discharge wound care:   Complete by: As directed    Irrigate right buttock wound  with NS, then pack  wet to dry daily   Face-to-face encounter (required for Medicare/Medicaid patients)   Complete by: As directed    I Erick Blinks certify that this patient is under my care and that I, or a nurse practitioner or physician's assistant working with me, had a face-to-face encounter that meets the physician face-to-face encounter requirements with this patient on 11/03/2019. The encounter with the patient was in whole, or in part for the following medical condition(s) which is the primary reason for home health care (List medical condition): gluteal wound care   The encounter with the patient was in whole, or in part, for the following medical condition, which is the primary reason for home health care: wound care, gluteal abscess   I certify that, based on my findings, the following services are medically necessary home health services: Nursing   Reason for Medically Necessary Home Health Services: Skilled Nursing- Assessment and Observation of Wound Status   My clinical findings support the need for the above services: Pain interferes with ambulation/mobility   Further, I certify that my clinical findings support that this patient is homebound due to: Pain interferes with ambulation/mobility   Home Health   Complete by: As directed    To provide the following care/treatments: RN   Increase activity slowly   Complete by: As directed      Allergies as of 11/03/2019   No Known Allergies     Medication List    STOP taking these medications   predniSONE 20 MG tablet Commonly known as: Deltasone     TAKE these medications   acetaminophen 325 MG tablet Commonly known as: TYLENOL Take 650 mg by mouth every 6 (six) hours as needed.   albuterol 108 (90 Base) MCG/ACT inhaler Commonly known as: VENTOLIN HFA Inhale 2 puffs into the lungs every 4 (four) hours as needed for wheezing or shortness of breath.   amoxicillin-clavulanate 875-125 MG tablet Commonly known as: Augmentin Take 1 tablet by  mouth 2 (two) times daily for 10 days.   ascorbic acid 500 MG tablet Commonly known as: VITAMIN C Take 1 tablet (500 mg total) by mouth daily.   chlorpheniramine-HYDROcodone 10-8 MG/5ML Suer Commonly known as: TUSSIONEX Take 5 mLs by mouth every 12 (twelve) hours as needed for cough.   doxycycline 100 MG capsule Commonly known as: VIBRAMYCIN Take 1 capsule (100 mg total) by mouth 2 (two) times daily.   guaiFENesin-dextromethorphan 100-10 MG/5ML syrup Commonly known as: ROBITUSSIN DM Take 10 mLs by mouth every 4 (four) hours as needed for cough.   hydrocortisone 25 MG suppository Commonly known as: ANUSOL-HC Place 1 suppository (25 mg total) rectally 2 (two) times daily.   multivitamin tablet Take 1 tablet by mouth daily.   pantoprazole 40 MG tablet Commonly known as: PROTONIX Take 1 tablet (40 mg total) by mouth daily.   polyethylene glycol 17 g packet Commonly known as: MIRALAX / GLYCOLAX Take 17 g by mouth daily.   senna-docusate 8.6-50 MG tablet Commonly known as: Senokot-S Take 2 tablets by mouth at bedtime.   traMADol 50 MG tablet Commonly known as: Ultram Take 1 tablet (50 mg total) by mouth every 6 (six) hours as needed.   zinc sulfate 220 (50 Zn) MG capsule Take 1 capsule (220 mg total) by mouth daily.            Discharge Care Instructions  (From admission, onward)         Start     Ordered  11/03/19 0000  Discharge wound care:       Comments: Irrigate right buttock wound with NS, then pack wet to dry daily   11/03/19 1723          Follow-up Information    Donita BrooksPickard, Warren T, MD Follow up on 11/11/2019.   Specialty: Family Medicine Why: appointment time 12:15PM Contact information: [address] [place] [address] [phone]              No Known Allergies  Consultations:  General surgery   Procedures/Studies: DG Chest 2 View  Result Date: 10/19/2019 CLINICAL DATA:  Persistent cough post discharge with  COVID. EXAM: CHEST - 2 VIEW COMPARISON:  09/29/2019 FINDINGS: Extensive bilateral airspace opacities. No visible pleural effusions or pneumothorax. Cardiac silhouette is enlarged and accentuated by low lung volumes. No evidence of acute osseous abnormality. IMPRESSION: 1. Extensive bilateral airspace opacities, compatible with multifocal pneumonia. 2. Suspected cardiomegaly. Electronically Signed   By: Feliberto HartsFrederick S Zufall MD   On: 10/19/2019 16:24   CT Angio Chest PE W and/or Wo Contrast  Result Date: 10/19/2019 CLINICAL DATA:  Cough and shortness of breath. COVID-19 virus infection. EXAM: CT ANGIOGRAPHY CHEST WITH CONTRAST TECHNIQUE: Multidetector CT imaging of the chest was performed using the standard protocol during bolus administration of intravenous contrast. Multiplanar CT image reconstructions and MIPs were obtained to evaluate the vascular anatomy. Study was performed twice due to suboptimal opacification of pulmonary arteries. CONTRAST:  180mL OMNIPAQUE IOHEXOL 350 MG/ML SOLN COMPARISON:  10/03/2019 FINDINGS: Cardiovascular: Suboptimal contrast opacification of the pulmonary arteries is demonstrated on both scans, likely due to contrast injection triggering being compromised by patient breathing during the exam, as well as pulmonary airspace disease. No large central pulmonary emboli are identified, however pulmonary emboli in sub lobar pulmonary arteries cannot be excluded on this exam. Mediastinum/Nodes: No masses or pathologically enlarged lymph nodes identified. Lungs/Pleura: Heterogeneous airspace opacity is again seen throughout both lungs, without significant change since prior exam. No evidence of parenchymal cavitation or pleural effusion. Upper abdomen: No acute findings. Musculoskeletal: No suspicious bone lesions identified. Review of the MIP images confirms the above findings. IMPRESSION: Suboptimal contrast opacification of pulmonary arteries. No large central pulmonary emboli identified,  however pulmonary emboli in sub-lobar pulmonary arteries cannot be excluded. No significant change in severe bilateral pulmonary airspace opacity, consistent with viral pneumonia. Electronically Signed   By: Danae OrleansJohn A Stahl M.D.   On: 10/19/2019 19:45   CT PELVIS W CONTRAST  Result Date: 10/30/2019 CLINICAL DATA:  Draining wound on buttocks after trauma several days ago. EXAM: CT PELVIS WITH CONTRAST TECHNIQUE: Multidetector CT imaging of the pelvis was performed using the standard protocol following the bolus administration of intravenous contrast. CONTRAST:  100mL OMNIPAQUE IOHEXOL 300 MG/ML  SOLN COMPARISON:  None. FINDINGS: Mild limitations secondary to patient body habitus. Urinary Tract:  Normal urinary bladder.  No distal hydroureter. Bowel: Suggestion of pelvic floor laxity/descent with possible rectal prolapse, including on 47/4. Otherwise normal pelvic bowel loops. Vascular/Lymphatic: No pelvic aneurysm. Pelvic sidewall adenopathy, most significant in the right external iliac station of 1.6 cm on 25/4. Right inguinal node measures 1.5 cm on 44/4. Reproductive:  Normal prostate. Other: No significant free fluid. Tiny fat containing right inguinal hernia. Edema is identified about the gluteal crease bilaterally, including on 47/4. There are areas of subcutaneous gas on the right with skin irregularity including on 59/4. Minimal left gas on 42/4. no well-defined fluid collection to suggest drainable abscess. Musculoskeletal: No acute  osseous abnormality. IMPRESSION: 1. Right greater than left edema and subcutaneous gas about the gluteal creases. Likely indicative of cellulitis. If perianal/perirectal fistula is a concern, the test of choice is high-resolution pre and post-contrast pelvic MRI. Rectal prolapse 2. Suggestion of pelvic floor descent with possible. 3. Pelvic adenopathy, most likely reactive. This could be re-evaluated with pelvic CT follow-up at 3-6 months. 4. Mild degradation secondary to  patient body habitus. Electronically Signed   By: Jeronimo Greaves M.D.   On: 10/30/2019 09:34   US Venous Img Lower Bilateral (DVT)  Result Date: 10/21/2019 CLINICAL DATA:  Shortness of breath, COVID-19 positivity, possible deep venous thrombosis. EXAM: BILATERAL LOWER EXTREMITY VENOUS DOPPLER ULTRASOUND TECHNIQUE: Gray-scale sonography with graded compression, as well as color Doppler and duplex ultrasound were performed to evaluate the lower extremity deep venous systems from the level of the common femoral vein and including the common femoral, femoral, profunda femoral, popliteal and calf veins including the posterior tibial, peroneal and gastrocnemius veins when visible. The superficial great saphenous vein was also interrogated. Spectral Doppler was utilized to evaluate flow at rest and with distal augmentation maneuvers in the common femoral, femoral and popliteal veins. COMPARISON:  None. FINDINGS: RIGHT LOWER EXTREMITY Common Femoral Vein: No evidence of thrombus. Normal compressibility, respiratory phasicity and response to augmentation. Saphenofemoral Junction: No evidence of thrombus. Normal compressibility and flow on color Doppler imaging. Profunda Femoral Vein: No evidence of thrombus. Normal compressibility and flow on color Doppler imaging. Femoral Vein: No evidence of thrombus. Normal compressibility, respiratory phasicity and response to augmentation. Popliteal Vein: No evidence of thrombus. Normal compressibility, respiratory phasicity and response to augmentation. Calf Veins: Calf veins are not well visualized due to the patient's body habitus Superficial Great Saphenous Vein: No evidence of thrombus. Normal compressibility. Venous Reflux:  None. Other Findings:  None. LEFT LOWER EXTREMITY Common Femoral Vein: No evidence of thrombus. Normal compressibility, respiratory phasicity and response to augmentation. Saphenofemoral Junction: No evidence of thrombus. Normal compressibility and flow on  color Doppler imaging. Profunda Femoral Vein: No evidence of thrombus. Normal compressibility and flow on color Doppler imaging. Femoral Vein: No evidence of thrombus. Normal compressibility, respiratory phasicity and response to augmentation. Popliteal Vein: No evidence of thrombus. Normal compressibility, respiratory phasicity and response to augmentation. Calf Veins: Calf veins are not well visualized due to the patient's body habitus. Superficial Great Saphenous Vein: No evidence of thrombus. Normal compressibility. Venous Reflux:  None. Other Findings:  None. IMPRESSION: No evidence of deep venous thrombosis in either lower extremity. Electronically Signed   By: Alcide Clever M.D.   On: 10/21/2019 10:24   DG Chest Port 1 View  Result Date: 10/19/2019 CLINICAL DATA:  Shortness of breath. COVID positive. EXAM: PORTABLE CHEST 1 VIEW COMPARISON:  October 19, 2019 (3:56 p.m.) FINDINGS: Persistent moderate to marked severity bilateral infiltrates are seen. Very mildly improved aeration of both lungs is noted. There is no evidence of a pleural effusion or pneumothorax. The cardiac silhouette is mildly enlarged and unchanged in size. The visualized skeletal structures are unremarkable. IMPRESSION: Persistent moderate to marked severity bilateral infiltrates. Electronically Signed   By: Aram Candela M.D.   On: 10/19/2019 18:37   ECHOCARDIOGRAM COMPLETE  Result Date: 10/20/2019    ECHOCARDIOGRAM REPORT   Patient Name:   Savier Trickett. Date of Exam: 10/20/2019 Medical Rec #:  [id number]          Height:       68.0 in Accession #:    [id number]  Weight:       350.0 lb Date of Birth:  [birth year]         BSA:          2.592 m Patient Age:    43 years           BP:           122/75 mmHg Patient Gender: M                  HR:           104 bpm. [facility] Procedure: 2D Echo, Cardiac Doppler and Color Doppler Indications:    Pulmonary hypertension 416.8 / I27.2  History:        Patient  has no prior history of Echocardiogram examinations.                 Risk Factors:Hypertension. Admit with Pneumonia due to COVID-19                 virus, Severe obesity, Acute pulmonary embolism.  Sonographer:    Celesta Gentile RCS Referring Phys: [id number] OLADAPO ADEFESO IMPRESSIONS  1. Left ventricular ejection fraction, by estimation, is 55 to 60%. The left ventricle has normal function. The left ventricle has no regional wall motion abnormalities. There is mild left ventricular hypertrophy. Left ventricular diastolic parameters are indeterminate.  2. Right ventricular systolic function is mildly reduced. The right ventricular size is normal. Tricuspid regurgitation signal is inadequate for assessing PA pressure.  3. Left atrial size was moderately dilated.  4. The mitral valve is grossly normal. Trivial mitral valve regurgitation.  5. The aortic valve is tricuspid. Aortic valve regurgitation is not visualized.  6. The inferior vena cava is dilated in size with <50% respiratory variability, suggesting right atrial pressure of 15 mmHg. FINDINGS  Left Ventricle: Left ventricular ejection fraction, by estimation, is 55 to 60%. The left ventricle has normal function. The left ventricle has no regional wall motion abnormalities. The left ventricular internal cavity size was normal in size. There is  mild left ventricular hypertrophy. Left ventricular diastolic parameters are indeterminate. Right Ventricle: The right ventricular size is normal. No increase in right ventricular wall thickness. Right ventricular systolic function is mildly reduced. Tricuspid regurgitation signal is inadequate for assessing PA pressure. Left Atrium: Left atrial size was moderately dilated. Right Atrium: Right atrial size was normal in size. Pericardium: There is no evidence of pericardial effusion. Mitral Valve: The mitral valve is grossly normal. Trivial mitral valve regurgitation. Tricuspid Valve: The tricuspid valve is grossly normal.  Tricuspid valve regurgitation is trivial. Aortic Valve: The aortic valve is tricuspid. Aortic valve regurgitation is not visualized. Pulmonic Valve: The pulmonic valve was grossly normal. Pulmonic valve regurgitation is trivial. Aorta: The aortic root is normal in size and structure. Venous: The inferior vena cava is dilated in size with less than 50% respiratory variability, suggesting right atrial pressure of 15 mmHg. IAS/Shunts: No atrial level shunt detected by color flow Doppler.  LEFT VENTRICLE PLAX 2D LVIDd:         5.54 cm LVIDs:         3.86 cm LV PW:         1.27 cm LV IVS:        1.37 cm LVOT diam:     2.50 cm LV SV:         126 LV SV Index:   49 LVOT Area:     4.91 cm  RIGHT VENTRICLE TAPSE (M-mode): 3.6 cm LEFT ATRIUM              Index       RIGHT ATRIUM           Index LA diam:        4.80 cm  1.85 cm/m  RA Area:     20.30 cm LA Vol (A2C):   99.8 ml  38.50 ml/m RA Volume:   63.50 ml  24.49 ml/m LA Vol (A4C):   116.0 ml 44.74 ml/m LA Biplane Vol: 108.0 ml 41.66 ml/m  AORTIC VALVE LVOT Vmax:   126.00 cm/s LVOT Vmean:  76.600 cm/s LVOT VTI:    0.257 m  AORTA Ao Root diam: 3.70 cm MITRAL VALVE MV Area (PHT): 4.49 cm     SHUNTS MV Decel Time: 169 msec     Systemic VTI:  0.26 m MV E velocity: 141.00 cm/s  Systemic Diam: 2.50 cm Nona Dell MD Electronically signed by Nona Dell MD Signature Date/Time: 10/20/2019/3:49:54 PM    Final        Subjective: Still has some soreness in gluteal region.  Overall he is feeling better.  Discharge Exam: Vitals:   11/02/19 1350 11/02/19 1945 11/02/19 2044 11/03/19 0453  BP: 127/88  (!) 107/58 132/72  Pulse: (!) 106  (!) 108 81  Resp: Temp: 98.6 F (37 C)   98.5 F (36.9 C)  TempSrc: Oral   Oral  SpO2: 95% 97% 96% 100%  Weight:      Height:        General: Pt is alert, awake, not in acute distress Cardiovascular: RRR, S1/S2 +, no rubs, no gallops Respiratory: CTA bilaterally, no wheezing, no rhonchi Abdominal: Soft, NT,  ND, bowel sounds + Extremities: no edema, no cyanosis    The results of significant diagnostics from this hospitalization (including imaging, microbiology, ancillary and laboratory) are listed below for reference.     Microbiology: Recent Results (from the past 240 hour(s))  Aerobic Culture (superficial specimen)     Status: None   Collection Time: 10/30/19  3:56 AM   Specimen: Wound  Result Value Ref Range Status   Specimen Description WOUND  Final   Special Requests BUTTOCKS  Final   Gram Stain   Final    FEW WBC PRESENT,BOTH PMN AND MONONUCLEAR ABUNDANT GRAM NEGATIVE RODS ABUNDANT GRAM POSITIVE COCCI IN PAIRS Performed at [facility], [address] [place], [place] [address]    Culture   Final    MODERATE ESCHERICHIA COLI FEW VANCOMYCIN RESISTANT ENTEROCOCCUS ISOLATED    Report Status 11/02/2019 FINAL  Final   Organism ID, Bacteria ESCHERICHIA COLI  Final   Organism ID, Bacteria VANCOMYCIN RESISTANT ENTEROCOCCUS ISOLATED  Final      Susceptibility   Escherichia coli - MIC*    AMPICILLIN <=2 SENSITIVE Sensitive     CEFAZOLIN <=4 SENSITIVE Sensitive     CEFEPIME <=0.12 SENSITIVE Sensitive     CEFTAZIDIME <=1 SENSITIVE Sensitive     CEFTRIAXONE <=0.25 SENSITIVE Sensitive     CIPROFLOXACIN <=0.25 SENSITIVE Sensitive     GENTAMICIN <=1 SENSITIVE Sensitive     IMIPENEM <=0.25 SENSITIVE Sensitive     TRIMETH/SULFA <=20 SENSITIVE Sensitive     AMPICILLIN/SULBACTAM <=2 SENSITIVE Sensitive     PIP/TAZO <=4 SENSITIVE Sensitive     * MODERATE ESCHERICHIA COLI   Vancomycin resistant enterococcus isolated - MIC*    AMPICILLIN >=32 RESISTANT Resistant  VANCOMYCIN >=32 RESISTANT Resistant     GENTAMICIN SYNERGY SENSITIVE Sensitive     LINEZOLID 2 SENSITIVE Sensitive     * FEW VANCOMYCIN RESISTANT ENTEROCOCCUS ISOLATED  Aerobic/Anaerobic Culture (surgical/deep wound)     Status: None   Collection Time: 10/30/19  8:16 PM   Specimen: Buttocks; Abscess  Result Value  Ref Range Status   Specimen Description   Final    BUTTOCKS Performed at [facility], [address], [place], [place] [address]    Special Requests   Final    Normal Performed at [facility], [address], [place], [place] [address]    Gram Stain   Final    RARE WBC PRESENT, PREDOMINANTLY PMN FEW GRAM POSITIVE COCCI RARE GRAM NEGATIVE RODS    Culture   Final    FEW ESCHERICHIA COLI FEW BACTEROIDES OVATUS BETA LACTAMASE POSITIVE Performed at [facility], [address], [place], [place] [address]    Report Status 11/03/2019 FINAL  Final   Organism ID, Bacteria ESCHERICHIA COLI  Final      Susceptibility   Escherichia coli - MIC*    AMPICILLIN <=2 SENSITIVE Sensitive     CEFAZOLIN <=4 SENSITIVE Sensitive     CEFEPIME <=0.12 SENSITIVE Sensitive     CEFTAZIDIME <=1 SENSITIVE Sensitive     CEFTRIAXONE <=0.25 SENSITIVE Sensitive     CIPROFLOXACIN <=0.25 SENSITIVE Sensitive     GENTAMICIN <=1 SENSITIVE Sensitive     IMIPENEM <=0.25 SENSITIVE Sensitive     TRIMETH/SULFA <=20 SENSITIVE Sensitive     AMPICILLIN/SULBACTAM <=2 SENSITIVE Sensitive     PIP/TAZO <=4 SENSITIVE Sensitive     * FEW ESCHERICHIA COLI     Labs: BNP (last 3 results) Recent Labs    09/30/19 0755  BNP 26.0   Basic Metabolic Panel: Recent Labs  Lab 10/30/19 0406 10/31/19 0947 11/02/19 0941  NA 138 136 139  K 3.2* 3.6 3.6  CL 100 99 101  CO2 28 27 28   GLUCOSE 140* 127* 140*  BUN 9 10 5*  CREATININE 0.94 0.90 0.85  CALCIUM 8.4* 8.7* 8.6*  MG  --   --  2.0   Liver Function Tests: Recent Labs  Lab 10/30/19 0406  AST 36  ALT 57*  ALKPHOS 90  BILITOT 0.6  PROT 7.6  ALBUMIN 2.3*   No results for input(s): LIPASE, AMYLASE in the last 168 hours. No results for input(s): AMMONIA in the last 168 hours. CBC: Recent Labs  Lab 10/30/19 0406 10/31/19 0947 11/02/19 0941  WBC 16.9* 14.0* 10.8*  HGB 8.8* 8.3* 8.0*  HCT 28.7* 27.8* 27.5*  MCV 66.4* 67.1* 68.2*  PLT 549* 473* 413*    Cardiac Enzymes: No results for input(s): CKTOTAL, CKMB, CKMBINDEX, TROPONINI in the last 168 hours. BNP: Invalid input(s): POCBNP CBG: No results for input(s): GLUCAP in the last 168 hours. D-Dimer No results for input(s): DDIMER in the last 72 hours. Hgb A1c No results for input(s): HGBA1C in the last 72 hours. Lipid Profile No results for input(s): CHOL, HDL, LDLCALC, TRIG, CHOLHDL, LDLDIRECT in the last 72 hours. Thyroid function studies No results for input(s): TSH, T4TOTAL, T3FREE, THYROIDAB in the last 72 hours.  Invalid input(s): FREET3 Anemia work up No results for input(s): VITAMINB12, FOLATE, FERRITIN, TIBC, IRON, RETICCTPCT in the last 72 hours. Urinalysis No results found for: COLORURINE, APPEARANCEUR, LABSPEC, PHURINE, GLUCOSEU, HGBUR, BILIRUBINUR, KETONESUR, PROTEINUR, UROBILINOGEN, NITRITE, LEUKOCYTESUR [facility] Invalid input(s): PROCALCITONIN,  WBC,  LACTICIDVEN Microbiology Recent Results (from the  past 240 hour(s))  Aerobic Culture (superficial specimen)     Status: None   Collection Time: 10/30/19  3:56 AM   Specimen: Wound  Result Value Ref Range Status   Specimen Description WOUND  Final   Special Requests BUTTOCKS  Final   Gram Stain   Final    FEW WBC PRESENT,BOTH PMN AND MONONUCLEAR ABUNDANT GRAM NEGATIVE RODS ABUNDANT GRAM POSITIVE COCCI IN PAIRS Performed at [facility], [address], [place], [place] [address]    Culture   Final    MODERATE ESCHERICHIA COLI FEW VANCOMYCIN RESISTANT ENTEROCOCCUS ISOLATED    Report Status 11/02/2019 FINAL  Final   Organism ID, Bacteria ESCHERICHIA COLI  Final   Organism ID, Bacteria VANCOMYCIN RESISTANT ENTEROCOCCUS ISOLATED  Final      Susceptibility   Escherichia coli - MIC*    AMPICILLIN <=2 SENSITIVE Sensitive     CEFAZOLIN <=4 SENSITIVE Sensitive     CEFEPIME <=0.12 SENSITIVE Sensitive     CEFTAZIDIME <=1 SENSITIVE Sensitive     CEFTRIAXONE <=0.25 SENSITIVE Sensitive     CIPROFLOXACIN  <=0.25 SENSITIVE Sensitive     GENTAMICIN <=1 SENSITIVE Sensitive     IMIPENEM <=0.25 SENSITIVE Sensitive     TRIMETH/SULFA <=20 SENSITIVE Sensitive     AMPICILLIN/SULBACTAM <=2 SENSITIVE Sensitive     PIP/TAZO <=4 SENSITIVE Sensitive     * MODERATE ESCHERICHIA COLI   Vancomycin resistant enterococcus isolated - MIC*    AMPICILLIN >=32 RESISTANT Resistant     VANCOMYCIN >=32 RESISTANT Resistant     GENTAMICIN SYNERGY SENSITIVE Sensitive     LINEZOLID 2 SENSITIVE Sensitive     * FEW VANCOMYCIN RESISTANT ENTEROCOCCUS ISOLATED  Aerobic/Anaerobic Culture (surgical/deep wound)     Status: None   Collection Time: 10/30/19  8:16 PM   Specimen: Buttocks; Abscess  Result Value Ref Range Status   Specimen Description   Final    BUTTOCKS Performed at [facility], [address] [place], [place] [address]    Special Requests   Final    Normal Performed at [facility], [address] [place], [place] [address]    Gram Stain   Final    RARE WBC PRESENT, PREDOMINANTLY PMN FEW GRAM POSITIVE COCCI RARE GRAM NEGATIVE RODS    Culture   Final    FEW ESCHERICHIA COLI FEW BACTEROIDES OVATUS BETA LACTAMASE POSITIVE Performed at [facility], [address] [place], [place] [address]    Report Status 11/03/2019 FINAL  Final   Organism ID, Bacteria ESCHERICHIA COLI  Final      Susceptibility   Escherichia coli - MIC*    AMPICILLIN <=2 SENSITIVE Sensitive     CEFAZOLIN <=4 SENSITIVE Sensitive     CEFEPIME <=0.12 SENSITIVE Sensitive     CEFTAZIDIME <=1 SENSITIVE Sensitive     CEFTRIAXONE <=0.25 SENSITIVE Sensitive     CIPROFLOXACIN <=0.25 SENSITIVE Sensitive     GENTAMICIN <=1 SENSITIVE Sensitive     IMIPENEM <=0.25 SENSITIVE Sensitive     TRIMETH/SULFA <=20 SENSITIVE Sensitive     AMPICILLIN/SULBACTAM <=2 SENSITIVE Sensitive     PIP/TAZO <=4 SENSITIVE Sensitive     * FEW ESCHERICHIA COLI     Time coordinating discharge:  SIGNED:   Erick Blinks, MD  [facility] 11/03/2019, 8:56 PM   If 7PM-7AM, please contact night-coverage [email or web address]

## 2019-11-05 ENCOUNTER — Other Ambulatory Visit: Payer: Self-pay

## 2019-11-05 ENCOUNTER — Encounter (HOSPITAL_COMMUNITY): Payer: Self-pay | Admitting: Physical Therapy

## 2019-11-05 ENCOUNTER — Ambulatory Visit (HOSPITAL_COMMUNITY): Payer: 59 | Attending: Internal Medicine | Admitting: Physical Therapy

## 2019-11-05 DIAGNOSIS — M7918 Myalgia, other site: Secondary | ICD-10-CM | POA: Diagnosis not present

## 2019-11-05 DIAGNOSIS — S31829A Unspecified open wound of left buttock, initial encounter: Secondary | ICD-10-CM | POA: Insufficient documentation

## 2019-11-05 NOTE — Therapy (Signed)
[facility] [address] [place], [place], [address] Phone: [phone]   Fax:  [phone]  Wound Care Evaluation  Patient Details  Name: Ricardo Lopez. MRN: [id number] Date of Birth: [birth year] Referring Provider (PT): Erick Blinks   Encounter Date: 11/05/2019   PT End of Session - 11/05/19 1608    Visit Number 1    Number of Visits 6    Date for PT Re-Evaluation 12/17/19    Authorization Type UHC    Authorization - Visit Number 1    Authorization - Number of Visits 60    Progress Note Due on Visit 6    PT Start Time 1410    PT Stop Time 1435    PT Time Calculation (min) 25 min    Behavior During Therapy [facility] for tasks assessed/performed           Past Medical History:  Diagnosis Date  . Hypertension   . Obesity     History reviewed. No pertinent surgical history.  Discharge summary from 22/17:  44 year old male with a history of hypertension, morbid obesity, diabetes mellitus type 2 presenting with increasing pain and drainage from his buttocks. The patient was recently admitted to the hospital from 10/19/2019 to 10/22/2019 secondary to respiratory failure as well as E. coli bacteremia. The patient was discharged home with cefdinir which he finished on 10/30/2019. It was felt that his respiratory failure was likely secondary to complications from his COVID-19 pneumonia in the setting of morbid obesity and OHS. He was discharged home on 2 L nasal cannula. In addition, the patient was also recently discharged after a stay from 09/29/2019 to 10/07/2019 after treatment for a COVID-19 pneumonia. The patient states that he had some gluteal pain even before he left the hospital on 10/22/2019. However, he has noted increasing edema and erythema and pain for the last 3 to 4 days. He is also noted some purulent drainage. He denied any fevers, chills, chest pain, shortness breath, cough, hemoptysis, vomiting, diarrhea, abdominal  pain. In the emergency department, the patient had low-grade temperature of 9 9.1 F. He was mildly tachycardic up to 110. He was hemodynamically stable. Oxygen saturation was 97% on room air. The patient was started on vancomycin and Zosyn. General surgery was consulted to assist with management.    There were no vitals filed for this visit.   Subjective Assessment - 11/05/19 1440    Subjective Pt wife states that the first time her husband was in the hospital for Covid they did not inflate the bed which caused the wound on his buttock.  He had increased pain prior to this but no open wound.    Currently in Pain? Yes    Pain Score 5    worst this week 10; best 2   Pain Location Buttocks    Pain Orientation Right    Pain Descriptors / Indicators Aching    Pain Type Acute pain    Pain Onset 1 to 4 weeks ago    Pain Frequency Constant    Aggravating Factors  dressing change    Pain Relieving Factors lying on stomach    Effect of Pain on Daily Activities unable to lie on back            [facility] PT Assessment - 11/05/19 0001      Assessment   Medical Diagnosis Buttock wound     Referring Provider (PT) Erick Blinks    Prior Therapy none  Precautions   Precautions --   cellulitis      Restrictions   Weight Bearing Restrictions No      Balance Screen   Has the patient fallen in the past 6 months No    Has the patient had a decrease in activity level because of a fear of falling?  No    Is the patient reluctant to leave their home because of a fear of falling?  No           Wound Therapy - 11/05/19 1440    Subjective Pt has had wound for several weeks pain for longer than that     Patient and Family Stated Goals no wound     Date of Onset 09/29/19    Prior Treatments IV antibiotic, nursing and home care.     Wound Properties Date First Assessed: 10/31/19 Time First Assessed: 1645 Wound Type: Other (Comment) , gluteal abscess  Location: Buttocks Location  Orientation: Right Wound Description (Comments): open abscess with purulent drainage Present on Admission: Yes   Dressing Type Gauze (Comment)    Dressing Changed Changed    Dressing Status Old drainage    Dressing Change Frequency PRN    Site / Wound Assessment Granulation tissue    % Wound base Red or Granulating 95%    % Wound base Yellow/Fibrinous Exudate 5%    Peri-wound Assessment Induration    Wound Length (cm) 6.8 cm    Wound Width (cm) 3.8 cm    Wound Depth (cm) 2.5 cm    Wound Volume (cm^3) 64.6 cm^3    Wound Surface Area (cm^2) 25.84 cm^2    Undermining (cm) inferior aspect     Margins Unattached edges (unapproximated)    Drainage Amount Minimal    Drainage Description Serosanguineous    Treatment Cleansed;Other (Comment)   education.    Wound Therapy - Clinical Statement Mr. Melendrez is a 44 yo male who has a history of lymphedema , he was dx with Covid in early September.  He was having buttock pain prior to the dx, however , per his wife he was admitted to the hospital and placed on a bed which was not inflated.  24 hrs later he had a sore.  The pt has been hospitalized twice with COVID 19 and recieved antibiotics and care at the hospital for his wound.  The wife states that just prior to discharge a physican debrided the wound as well.   The pt and wife have been self caring since discharge from the hospital on the 13th.  At this time the wound is well granulated and needs no debridment.  The therapist demonstrated decongestive techniques for the wife to complete around the wound, for the wife to continue dressing the wound as she has, (wet to moist saline gauze), for the pt to begin completing 10 gluteal sets every hour, drink 64 oz of water a day and increase his protein intack.      Wound Therapy - Functional Problem List unable to sit or lie on his back.     Factors Delaying/Impairing Wound Healing Infection - systemic/local    Hydrotherapy Plan Dressing change;Patient/family  education    Wound Therapy - Frequency Other (comment)   1 x a week for 6 weeks    Wound Therapy - Current Recommendations PT    Wound Plan assess wound weekly to make sure it is healing appropriately.  Advise family as needed on treatment.  Objective measurements completed on examination: See above findings.             PT Education - 11/05/19 1607    Education Details Glut sets, increase water and protein and that we have lymphedema services here if he would like to get his LE lymphedema under control    Person(s) Educated Patient;Spouse    Methods Explanation;Demonstration    Comprehension Verbalized understanding;Returned demonstration            PT Short Term Goals - 11/05/19 1612      PT SHORT TERM GOAL #1   Title Pt wound size to be 100% granulated and 50% of the size    Time 3    Period Weeks    Status New    Target Date 11/26/19      PT SHORT TERM GOAL #2   Title Pt pain to be no greater than a 6/10 in his Rt buttocks.    Time 3    Period Weeks    Status New             PT Long Term Goals - 11/05/19 1614      PT LONG TERM GOAL #1   Title PT wound to be healed to allow pt to sit down.    Time 6    Period Weeks    Status New    Target Date 12/17/19      PT LONG TERM GOAL #2   Title PT pain to be no greater than a 2/10 to allow pt to sleep through the night    Time 6    Period Weeks    Status New                Plan - 11/05/19 1610    Clinical Impression Statement as above    Personal Factors and Comorbidities Comorbidity 3+    Comorbidities obesity, s/p Covid, lympedema    Examination-Activity Limitations Dressing;Hygiene/Grooming;Sleep;Sit    Examination-Participation Restrictions Occupation    Stability/Clinical Decision Making Stable/Uncomplicated    Clinical Decision Making Low    Rehab Potential Good    PT Frequency 1x / week    PT Duration 6 weeks    PT Treatment/Interventions Patient/family  education;Other (comment);Manual techniques   debridement as needed   PT Next Visit Plan measure wound weekly, cleanse and apply proper dressing    PT Home Exercise Plan glut sets, self manual, increase water and protien intake.           Patient will benefit from skilled therapeutic intervention in order to improve the following deficits and impairments:  Obesity, Decreased skin integrity, Pain  Visit Diagnosis: Left buttock pain  Buttock wound, left, initial encounter    Problem List Patient Active Problem List   Diagnosis Date Noted  . Sepsis due to undetermined organism (HCC) 10/31/2019  . Chronic respiratory failure with hypoxia (HCC) 10/31/2019  . Abscess and cellulitis of gluteal region 10/30/2019  . Acute respiratory disease due to COVID-19 virus 10/22/2019  . E coli bacteremia 10/22/2019  . Hyperglycemia 10/19/2019  . Hypoalbuminemia 10/19/2019  . Pneumonia due to COVID-19 virus 09/29/2019  . Acute respiratory failure with hypoxia due to Covid  09/29/2019  . Transaminitis 09/29/2019  . AKI (acute kidney injury) (HCC) 09/29/2019  . Microcytic anemia 09/29/2019  . Chronic pain syndrome 10/15/2012  . Osteochondral defect of ankle 05/26/2012  . Ankle arthritis 05/26/2012  . Right knee pain 05/26/2012  . Medial meniscus, posterior horn derangement 05/26/2012  .  Severe obesity (BMI >= 40) (HCC)   . Hypertension     Virgina OrganCynthia Davarious [place], [place] CL[place] [phone] 11/05/2019, 4:16 PM  [facility] [address] [place], [place], [address] Phone: [phone]   Fax:  [phone]  Name: Thornton ParkRobert E Kruzel Jr. MRN: [id number] Date of Birth: 07-13-1975

## 2019-11-08 ENCOUNTER — Ambulatory Visit (HOSPITAL_COMMUNITY): Payer: 59 | Admitting: Physical Therapy

## 2019-11-09 ENCOUNTER — Ambulatory Visit (INDEPENDENT_AMBULATORY_CARE_PROVIDER_SITE_OTHER): Payer: 59 | Admitting: Family Medicine

## 2019-11-09 ENCOUNTER — Other Ambulatory Visit: Payer: Self-pay

## 2019-11-09 ENCOUNTER — Encounter: Payer: Self-pay | Admitting: Family Medicine

## 2019-11-09 VITALS — BP 136/80 | HR 115 | Temp 98.5°F | Ht 68.0 in | Wt 383.6 lb

## 2019-11-09 DIAGNOSIS — R7881 Bacteremia: Secondary | ICD-10-CM

## 2019-11-09 DIAGNOSIS — L03317 Cellulitis of buttock: Secondary | ICD-10-CM

## 2019-11-09 DIAGNOSIS — J9611 Chronic respiratory failure with hypoxia: Secondary | ICD-10-CM | POA: Diagnosis not present

## 2019-11-09 DIAGNOSIS — L0231 Cutaneous abscess of buttock: Secondary | ICD-10-CM | POA: Diagnosis not present

## 2019-11-09 DIAGNOSIS — U071 COVID-19: Secondary | ICD-10-CM | POA: Insufficient documentation

## 2019-11-09 DIAGNOSIS — B9620 Unspecified Escherichia coli [E. coli] as the cause of diseases classified elsewhere: Secondary | ICD-10-CM

## 2019-11-09 MED ORDER — FERROUS SULFATE 324 (65 FE) MG PO TBEC: 1.0000 | DELAYED_RELEASE_TABLET | Freq: Every morning | ORAL | 3 refills | Status: DC

## 2019-11-09 MED ORDER — HYDROCOD POLST-CPM POLST ER 10-8 MG/5ML PO SUER
5.0000 mL | Freq: Two times a day (BID) | ORAL | 0 refills | Status: DC | PRN
Start: 2019-11-09 — End: 2019-11-23

## 2019-11-09 MED ORDER — HYDROCODONE-ACETAMINOPHEN 5-325 MG PO TABS
1.0000 | ORAL_TABLET | Freq: Four times a day (QID) | ORAL | 0 refills | Status: DC | PRN
Start: 2019-11-09 — End: 2019-11-23

## 2019-11-09 NOTE — Progress Notes (Signed)
Subjective:    Patient ID: Ricardo Parkobert E Coia Jr., male    DOB: 1975/06/10, 44 y.o.   MRN: [id number]  HPI Patient was admitted in September for COVID pneumonia.  Subsequently readmitted for E.coli bacteremia ultimately determined to be due to a perirectal infection.  See most recent discharge summary: Admit date: 10/30/2019 Discharge date: 11/03/2019  Admitted From: Home Disposition: Home  Recommendations for Outpatient Follow-up:  1. Follow up with PCP in 1-2 weeks 2. Please obtain BMP/CBC in one week 3. Follow-up with general surgery as needed 4. He has been referred to outpatient physical therapy for wound care  Home Health: Home health RN Equipment/Devices:  Discharge Condition: Stable CODE STATUS: Full code Diet recommendation: Heart healthy  Brief/Interim Summary: 44 year old male with a history of hypertension, morbid obesity, diabetes mellitus type 2 presenting with increasing pain and drainage from his buttocks. The patient was recently admitted to the hospital from 10/19/2019 to 10/22/2019 secondary to respiratory failure as well as E. coli bacteremia. The patient was discharged home with cefdinir which he finished on 10/30/2019. It was felt that his respiratory failure was likely secondary to complications from his COVID-19 pneumonia in the setting of morbid obesity and OHS. He was discharged home on 2 L nasal cannula. In addition, the patient was also recently discharged after a stay from 09/29/2019 to 10/07/2019 after treatment for a COVID-19 pneumonia. The patient states that he had some gluteal pain even before he left the hospital on 10/22/2019. However, he has noted increasing edema and erythema and pain for the last 3 to 4 days. He is also noted some purulent drainage. He denied any fevers, chills, chest pain, shortness breath, cough, hemoptysis, vomiting, diarrhea, abdominal pain. In the emergency department, the patient had low-grade temperature of 9 9.1 F. He  was mildly tachycardic up to 110. He was hemodynamically stable. Oxygen saturation was 97% on room air. The patient was started on vancomycin and Zosyn. General surgery was consulted to assist with management.  Discharge Diagnoses:  Principal Problem:   Abscess and cellulitis of gluteal region Active Problems:   Severe obesity (BMI >= 40) (HCC)   Acute respiratory failure with hypoxia due to Covid    Microcytic anemia   Sepsis due to undetermined organism (HCC)   Chronic respiratory failure with hypoxia (HCC)  Sepsis -Present on admission -Secondary to gluteal abscess -Presented with tachycardia and WBC up to 16.9 -Lactic acid 1.6 -Follow cultures--VRE+Ecoli -Continue vancomycin and Zosyn  Gluteal abscess -Appreciate surgical consult -Case discussed with Dr. Jorja LoaJenkins--low suspicion of fistula at this time-->treat as gluteal abscess with outpatient follow-up and consideration for MRI as an outpatient if clinically worsens or no improvement -Continue vancomycin and Zosyn -WBC decreasing 16.9>>14.0>>10.8 -NON-surgical setting cultures = VRE and Ecoli -Doubt VRE is true pathogen as patient is improving and WBC improving without active tx against VRE -Treated with vancomycin/Zosyn -Since he is clinically improved, antibiotics transition to doxycycline/Augmentin -He has been referred for outpatient wound care with physical therapy -will need HHRN to help with dressing changes  Controlled diabetes mellitus type 2 -10/19/2019 hemoglobin A1c 7.1 -NovoLog sliding scale  Morbid obesity -BMI 60.82 -Lifestyle modification  Chronic respiratory failure with hypoxia -Stable on 2 L nasal cannula -Encourage incentive spirometry  Iron deficiency anemia -10/20/2019 iron saturation 7% -Ferritin 289 -start iron once infx resolved, as an outpatient   Patient is here today for follow-up.  He has seen wound care every week.  They have appointment to see him tomorrow.  He  states  that the wound is improving on his gluteal area and that the pain is getting better.  He denies any fever.  He denies any chills.  He denies any nausea or vomiting.  He does have significant pain in the gluteal area especially when he sits which is primarily the majority of the day.  He is still on oxygen 2 L via nasal cannula.  He is 97% on 2 L but if he takes his oxygen off it drops to 89% even at rest.  He denies any chest pain however he does have shortness of breath with minimal activity.  He also continues to have a nonproductive cough.  Fortunately his lungs sound relatively clear on examination today.  We reviewed his lab work and we discussed his diabetes diagnosis that he was unaware of.  We discussed dietary changes.  We also mentioned that he is severely anemic and that his iron level was low.  I recommended starting iron sulfate 324 mg daily.  At the present time he is not taking any iron supplement.  He denies any pleurisy he does have fibrotic changes in both legs consistent with chronic lymphedema.  He does have edema in both legs to the knees but there is no evidence of a DVT or PE on his exam today Past Medical History:  Diagnosis Date  . Hypertension   . Obesity    No past surgical history on file. Current Outpatient Medications on File Prior to Visit  Medication Sig Dispense Refill  . acetaminophen (TYLENOL) 325 MG tablet Take 650 mg by mouth every 6 (six) hours as needed.    Marland Kitchen albuterol (VENTOLIN HFA) 108 (90 Base) MCG/ACT inhaler Inhale 2 puffs into the lungs every 4 (four) hours as needed for wheezing or shortness of breath. 18 g 2  . amoxicillin-clavulanate (AUGMENTIN) 875-125 MG tablet Take 1 tablet by mouth 2 (two) times daily for 10 days. 20 tablet 0  . ascorbic acid (VITAMIN C) 500 MG tablet Take 1 tablet (500 mg total) by mouth daily. 30 tablet 1  . chlorpheniramine-HYDROcodone (TUSSIONEX) 10-8 MG/5ML SUER Take 5 mLs by mouth every 12 (twelve) hours as needed for cough. 140  mL 0  . doxycycline (VIBRAMYCIN) 100 MG capsule Take 1 capsule (100 mg total) by mouth 2 (two) times daily. 20 capsule 0  . guaiFENesin-dextromethorphan (ROBITUSSIN DM) 100-10 MG/5ML syrup Take 10 mLs by mouth every 4 (four) hours as needed for cough. 118 mL 0  . hydrocortisone (ANUSOL-HC) 25 MG suppository Place 1 suppository (25 mg total) rectally 2 (two) times daily. 12 suppository 0  . Multiple Vitamin (MULTIVITAMIN) tablet Take 1 tablet by mouth daily.    . pantoprazole (PROTONIX) 40 MG tablet Take 1 tablet (40 mg total) by mouth daily. 30 tablet 1  . polyethylene glycol (MIRALAX / GLYCOLAX) 17 g packet Take 17 g by mouth daily. 14 each 0  . senna-docusate (SENOKOT-S) 8.6-50 MG tablet Take 2 tablets by mouth at bedtime. 60 tablet 1  . traMADol (ULTRAM) 50 MG tablet Take 1 tablet (50 mg total) by mouth every 6 (six) hours as needed. 20 tablet 0  . zinc sulfate 220 (50 Zn) MG capsule Take 1 capsule (220 mg total) by mouth daily. 30 capsule 0   No current facility-administered medications on file prior to visit.   No Known Allergies Social History   Socioeconomic History  . Marital status: Single    Spouse name: Not on file  . Number of children: Not  on file  . Years of education: Not on file  . Highest education level: Not on file  Occupational History  . Not on file  Tobacco Use  . Smoking status: Never Smoker  . Smokeless tobacco: Never Used  Vaping Use  . Vaping Use: Never used  Substance and Sexual Activity  . Alcohol use: Yes    Comment: occasional  . Drug use: No  . Sexual activity: Never  Other Topics Concern  . Not on file  Social History Narrative  . Not on file   Social Determinants of Health   Financial Resource Strain:   . Difficulty of Paying Living Expenses: Not on file  Food Insecurity:   . Worried About Programme researcher, broadcasting/film/video in the Last Year: Not on file  . Ran Out of Food in the Last Year: Not on file  Transportation Needs:   . Lack of Transportation  (Medical): Not on file  . Lack of Transportation (Non-Medical): Not on file  Physical Activity:   . Days of Exercise per Week: Not on file  . Minutes of Exercise per Session: Not on file  Stress:   . Feeling of Stress : Not on file  Social Connections:   . Frequency of Communication with Friends and Family: Not on file  . Frequency of Social Gatherings with Friends and Family: Not on file  . Attends Religious Services: Not on file  . Active Member of Clubs or Organizations: Not on file  . Attends Banker Meetings: Not on file  . Marital Status: Not on file  Intimate Partner Violence:   . Fear of Current or Ex-Partner: Not on file  . Emotionally Abused: Not on file  . Physically Abused: Not on file  . Sexually Abused: Not on file    Review of Systems  All other systems reviewed and are negative.      Objective:   Physical Exam Vitals reviewed.  Constitutional:      Appearance: He is obese.  Cardiovascular:     Rate and Rhythm: Normal rate and regular rhythm.     Heart sounds: Normal heart sounds.  Pulmonary:     Effort: Pulmonary effort is normal. No respiratory distress.     Breath sounds: Normal breath sounds. No wheezing, rhonchi or rales.  Musculoskeletal:        General: Deformity present.     Right lower leg: Edema present.     Left lower leg: Edema present.  Neurological:     Mental Status: He is alert.           Assessment & Plan:  Chronic respiratory failure with hypoxia (HCC) - Plan: CBC with Differential/Platelet, COMPLETE METABOLIC PANEL WITH GFR, DG Chest 2 View  Abscess and cellulitis of gluteal region - Plan: CBC with Differential/Platelet, COMPLETE METABOLIC PANEL WITH GFR  E coli bacteremia - Plan: CBC with Differential/Platelet, COMPLETE METABOLIC PANEL WITH GFR, Culture, blood (single) w Reflex to ID Panel  Discontinue tramadol and replace with Norco 5/325 1-2 p.o. every 6 hours as needed pain.  He can also use Tussionex 1  teaspoon every 12 hours as needed for cough.  Clinically his cellulitis is improving the gluteal region.  He has no signs of systemic illness or bacteremia such as fever, etc.  I will repeat a blood culture today and also check a CBC to monitor for any leukocytosis.  Complete Augmentin and doxycycline as prescribed.  Patient is seeing wound clinic for wound checks and he  has an appointment tomorrow.  Repeat chest x-ray in 2 weeks and see the patient back in 2 weeks to reassess weaning off oxygen.  At the present time he is not ready to do so.  At that point I will give the patient a flu shot.  Would recommend a Covid vaccine 90 days after his infection.  Discussed diabetes and recommended a low carbohydrate diet.

## 2019-11-09 NOTE — Patient Instructions (Signed)
° ° ° °  If you have lab work done today you will be contacted with your lab results within the next 2 weeks.  If you have not heard from us then please contact us. The fastest way to get your results is to register for My Chart. ° ° °IF you received an x-ray today, you will receive an invoice from [facility]. Please contact [facility] at [phone] with questions or concerns regarding your invoice.  ° °IF you received labwork today, you will receive an invoice from [facility]. Please contact [facility] at [phone] with questions or concerns regarding your invoice.  ° °Our billing staff will not be able to assist you with questions regarding bills from these companies. ° °You will be contacted with the lab results as soon as they are available. The fastest way to get your results is to activate your My Chart account. Instructions are located on the last page of this paperwork. If you have not heard from us regarding the results in 2 weeks, please contact this office. °  ° ° ° °

## 2019-11-10 ENCOUNTER — Ambulatory Visit (HOSPITAL_COMMUNITY): Payer: 59

## 2019-11-10 ENCOUNTER — Encounter (HOSPITAL_COMMUNITY): Payer: Self-pay

## 2019-11-10 DIAGNOSIS — S31829A Unspecified open wound of left buttock, initial encounter: Secondary | ICD-10-CM

## 2019-11-10 DIAGNOSIS — M7918 Myalgia, other site: Secondary | ICD-10-CM

## 2019-11-10 NOTE — Therapy (Signed)
[facility] [address] [place], [place], [address] Phone: [phone]   Fax:  [phone]  Wound Care Therapy  Patient Details  Name: Ricardo Lopez. MRN: [id number] Date of Birth: 09-Dec-1975 Referring Provider (PT): Erick Blinks   Encounter Date: 11/10/2019   PT End of Session - 11/10/19 1639    Visit Number 2    Number of Visits 6    Date for PT Re-Evaluation 12/17/19    Authorization Type UHC    Authorization - Visit Number 2    Authorization - Number of Visits 60    Progress Note Due on Visit 6    PT Start Time 1615    PT Stop Time 1632    PT Time Calculation (min) 17 min    Activity Tolerance Patient tolerated treatment well    Behavior During Therapy [facility] for tasks assessed/performed           Past Medical History:  Diagnosis Date  . COVID    9/21- hypoxia requiring oxygen  . Diabetes mellitus without complication (HCC)   . Gluteal abscess    ecoli after covid hospitalization 2021  . Hypertension   . Obesity     History reviewed. No pertinent surgical history.  There were no vitals filed for this visit.    Subjective Assessment - 11/10/19 1634    Subjective Pt arrived with wife, reports she has been changing dressings daily.    Pertinent History 44 year old male with a history of hypertension, morbid obesity, diabetes mellitus type 2 presenting with increasing pain and drainage from his buttocks.  The patient was recently admitted to the hospital from 10/19/2019 to 10/22/2019 secondary to respiratory failure as well as E. coli bacteremia.  The patient was discharged home with cefdinir which he finished on 10/30/2019.  It was felt that his respiratory failure was likely secondary to complications from his COVID-19 pneumonia in the setting of morbid obesity and OHS.  He was discharged home on 2 L nasal cannula.  In addition, the patient was also recently discharged after a stay from 09/29/2019 to 10/07/2019 after  treatment for a COVID-19 pneumonia.The patient states that he had some gluteal pain even before he left the hospital on 10/22/2019.  However, he has noted increasing edema and erythema and pain for the last 3 to 4 days.  He is also noted some purulent drainage.  He denied any fevers, chills, chest pain, shortness breath, cough, hemoptysis, vomiting, diarrhea, abdominal pain.In the emergency department, the patient had low-grade temperature of 9 9.1 F.  He was mildly tachycardic up to 110.  He was hemodynamically stable.  Oxygen saturation was 97% on room air.  The patient was started on vancomycin and Zosyn.    Currently in Pain? No/denies                     Wound Therapy - 11/10/19 0001    Subjective Pt arrived with wife, reports she has been changing dressings daily.    Patient and Family Stated Goals no wound     Date of Onset 09/29/19    Prior Treatments IV antibiotic, nursing and home care.     Pain Scale 0-10    Pain Score 0-No pain    Evaluation and Treatment Procedures Explained to Patient/Family Yes    Evaluation and Treatment Procedures agreed to    Wound Properties Date First Assessed: 10/31/19 Time First Assessed: 1645 Wound Type: Other (Comment) , gluteal abscess  Location: Buttocks Location Orientation: Right Wound Description (Comments): open abscess with purulent drainage Present on Admission: Yes   Dressing Type Gauze (Comment)   wet to moist 4x4 and medipore tape   Dressing Changed Changed    Dressing Status Clean;Dry    Dressing Change Frequency PRN    Site / Wound Assessment Granulation tissue    % Wound base Red or Granulating 95%    % Wound base Yellow/Fibrinous Exudate 5%    Peri-wound Assessment Induration    Wound Length (cm) 6.5 cm    Wound Width (cm) 5 cm    Wound Depth (cm) 2.5 cm    Wound Volume (cm^3) 81.25 cm^3    Wound Surface Area (cm^2) 32.5 cm^2    Undermining (cm) inferior aspect    Margins Unattached edges (unapproximated)    Drainage  Amount Minimal    Drainage Description Serosanguineous    Treatment Cleansed    Wound Therapy - Clinical Statement Pt arrived with reports of wife changing dressings daily.  Upon removal of dressings wound is clean wiht no debridement necessary.  Continued wet to moist gauze and medipore tape.      Wound Therapy - Functional Problem List unable to sit or lie on his back.     Factors Delaying/Impairing Wound Healing Infection - systemic/local    Hydrotherapy Plan Dressing change;Patient/family education    Wound Therapy - Frequency --   1x/week for 6 weeks   Wound Therapy - Current Recommendations PT    Wound Plan assess wound weekly to make sure it is healing appropriately.  Advise family as needed on treatment.     Dressing  saline 4x4 and medipore tape                     PT Short Term Goals - 11/05/19 1612      PT SHORT TERM GOAL #1   Title Pt wound size to be 100% granulated and 50% of the size    Time 3    Period Weeks    Status New    Target Date 11/26/19      PT SHORT TERM GOAL #2   Title Pt pain to be no greater than a 6/10 in his Rt buttocks.    Time 3    Period Weeks    Status New             PT Long Term Goals - 11/05/19 1614      PT LONG TERM GOAL #1   Title PT wound to be healed to allow pt to sit down.    Time 6    Period Weeks    Status New    Target Date 12/17/19      PT LONG TERM GOAL #2   Title PT pain to be no greater than a 2/10 to allow pt to sleep through the night    Time 6    Period Weeks    Status New                  Patient will benefit from skilled therapeutic intervention in order to improve the following deficits and impairments:     Visit Diagnosis: Buttock wound, left, initial encounter  Left buttock pain     Problem List Patient Active Problem List   Diagnosis Date Noted  . COVID   . Gluteal abscess   . Sepsis due to undetermined organism (HCC) 10/31/2019  . Chronic respiratory failure with hypoxia  (  HCC) 10/31/2019  . Abscess and cellulitis of gluteal region 10/30/2019  . Acute respiratory disease due to COVID-19 virus 10/22/2019  . E coli bacteremia 10/22/2019  . Hyperglycemia 10/19/2019  . Hypoalbuminemia 10/19/2019  . Pneumonia due to COVID-19 virus 09/29/2019  . Acute respiratory failure with hypoxia due to Covid  09/29/2019  . Transaminitis 09/29/2019  . AKI (acute kidney injury) (HCC) 09/29/2019  . Microcytic anemia 09/29/2019  . Chronic pain syndrome 10/15/2012  . Osteochondral defect of ankle 05/26/2012  . Ankle arthritis 05/26/2012  . Right knee pain 05/26/2012  . Medial meniscus, posterior horn derangement 05/26/2012  . Severe obesity (BMI >= 40) (HCC)   . Hypertension    Becky Sax, LPTA/CLT; CBIS [phone]  Juel Burrow 11/10/2019, 4:42 PM  [facility] [address] [place], [place], [address] Phone: [phone]   Fax:  [phone]  Name: Zlatan Hornback. MRN: [id number] Date of Birth: [birth year]

## 2019-11-11 ENCOUNTER — Inpatient Hospital Stay: Payer: BLUE CROSS/BLUE SHIELD | Admitting: Family Medicine

## 2019-11-15 ENCOUNTER — Telehealth: Payer: Self-pay

## 2019-11-15 ENCOUNTER — Telehealth (HOSPITAL_COMMUNITY): Payer: Self-pay | Admitting: Physical Therapy

## 2019-11-15 ENCOUNTER — Other Ambulatory Visit: Payer: Self-pay

## 2019-11-15 ENCOUNTER — Ambulatory Visit (HOSPITAL_COMMUNITY): Payer: 59 | Admitting: Physical Therapy

## 2019-11-15 ENCOUNTER — Encounter (HOSPITAL_COMMUNITY): Payer: Self-pay | Admitting: Physical Therapy

## 2019-11-15 DIAGNOSIS — M7918 Myalgia, other site: Secondary | ICD-10-CM | POA: Diagnosis not present

## 2019-11-15 DIAGNOSIS — S31829A Unspecified open wound of left buttock, initial encounter: Secondary | ICD-10-CM

## 2019-11-15 LAB — COMPLETE METABOLIC PANEL WITH GFR
AG Ratio: 1 (calc) (ref 1.0–2.5)
ALT: 30 U/L (ref 9–46)
AST: 21 U/L (ref 10–40)
Albumin: 3.5 g/dL — ABNORMAL LOW (ref 3.6–5.1)
Alkaline phosphatase (APISO): 74 U/L (ref 36–130)
BUN: 10 mg/dL (ref 7–25)
CO2: 29 mmol/L (ref 20–32)
Calcium: 9.2 mg/dL (ref 8.6–10.3)
Chloride: 103 mmol/L (ref 98–110)
Creat: 0.98 mg/dL (ref 0.60–1.35)
GFR, Est African American: 109 mL/min/{1.73_m2} (ref >=60)
GFR, Est Non African American: 94 mL/min/{1.73_m2} (ref >=60)
Globulin: 3.4 g/dL (calc) (ref 1.9–3.7)
Glucose, Bld: 103 mg/dL — ABNORMAL HIGH (ref 65–99)
Potassium: 4.3 mmol/L (ref 3.5–5.3)
Sodium: 142 mmol/L (ref 135–146)
Total Bilirubin: 0.3 mg/dL (ref 0.2–1.2)
Total Protein: 6.9 g/dL (ref 6.1–8.1)

## 2019-11-15 LAB — CBC WITH DIFFERENTIAL/PLATELET
Absolute Monocytes: 448 cells/uL (ref 200–950)
Basophils Absolute: 38 cells/uL (ref 0–200)
Basophils Relative: 0.5 %
Eosinophils Absolute: 53 cells/uL (ref 15–500)
Eosinophils Relative: 0.7 %
HCT: 29.4 % — ABNORMAL LOW (ref 38.5–50.0)
Hemoglobin: 8.7 g/dL — ABNORMAL LOW (ref 13.2–17.1)
Lymphs Abs: 1930 cells/uL (ref 850–3900)
MCH: 20 pg — ABNORMAL LOW (ref 27.0–33.0)
MCHC: 29.6 g/dL — ABNORMAL LOW (ref 32.0–36.0)
MCV: 67.6 fL — ABNORMAL LOW (ref 80.0–100.0)
MPV: 11.4 fL (ref 7.5–12.5)
Monocytes Relative: 5.9 %
Neutro Abs: 5130 cells/uL (ref 1500–7800)
Neutrophils Relative %: 67.5 %
Platelets: 303 10*3/uL (ref 140–400)
RBC: 4.35 10*6/uL (ref 4.20–5.80)
RDW: 17.3 % — ABNORMAL HIGH (ref 11.0–15.0)
Total Lymphocyte: 25.4 %
WBC: 7.6 10*3/uL (ref 3.8–10.8)

## 2019-11-15 LAB — CULTURE, BLOOD (SINGLE): MICRO NUMBER:: 11090211

## 2019-11-15 LAB — CBC MORPHOLOGY

## 2019-11-15 NOTE — Telephone Encounter (Signed)
Mr. Ricardo Lopez called and asked about his results for infection, I didn't see in his labs where he was tested for and infection. He said E.Coli ! He's also stating he's having bad insomnia and not it's been going on for awhile.

## 2019-11-15 NOTE — Therapy (Signed)
[facility] [address] [place], [place], [address] Phone: [phone]   Fax:  [phone]  Wound Care Therapy  Patient Details  Name: Ricardo Lopez. MRN: [id number] Date of Birth: 1975-12-29 Referring Provider (PT): Erick Blinks   Encounter Date: 11/15/2019   PT End of Session - 11/15/19 1339    Visit Number 3    Number of Visits 6    Date for PT Re-Evaluation 12/17/19    Authorization Type UHC    Authorization - Visit Number 3    Authorization - Number of Visits 60    Progress Note Due on Visit 6    PT Start Time 1315    PT Stop Time 1333    PT Time Calculation (min) 18 min    Activity Tolerance Patient tolerated treatment well    Behavior During Therapy WFL for tasks assessed/performed           Past Medical History:  Diagnosis Date  . COVID    9/21- hypoxia requiring oxygen  . Diabetes mellitus without complication (HCC)   . Gluteal abscess    ecoli after covid hospitalization 2021  . Hypertension   . Obesity     History reviewed. No pertinent surgical history.  There were no vitals filed for this visit.               Wound Therapy - 11/15/19 0001    Subjective PT states pain is so so ; his wife continues to change the dressing     Patient and Family Stated Goals no wound     Date of Onset 09/29/19    Prior Treatments IV antibiotic, nursing and home care.     Pain Scale 0-10    Pain Score 4     Pain Type Acute pain    Pain Location Buttocks    Pain Orientation Right    Pain Descriptors / Indicators Sore    Evaluation and Treatment Procedures Explained to Patient/Family Yes    Evaluation and Treatment Procedures agreed to    Wound Properties Date First Assessed: 10/31/19 Time First Assessed: 1645 Wound Type: Other (Comment) , gluteal abscess  Location: Buttocks Location Orientation: Right Wound Description (Comments): open abscess with purulent drainage Present on Admission: Yes   Dressing  Type Foam - Lift dressing to assess site every shift    Dressing Changed Changed    Dressing Status Old drainage    Dressing Change Frequency PRN    Site / Wound Assessment Red    % Wound base Red or Granulating 100%    Wound Length (cm) 6.8 cm    Wound Width (cm) 3.8 cm    Wound Depth (cm) 2 cm    Wound Volume (cm^3) 51.68 cm^3    Wound Surface Area (cm^2) 25.84 cm^2    Drainage Amount Scant    Drainage Description Serous    Treatment Cleansed    Wound Therapy - Clinical Statement PT wound depth is filling in but length and width remain the same.  Pt wound now 100% red.  Therapist recommended dressing with hydrogel as wound is very clean but is starting to have less moisture.  Noted induration around wound bed is breaking up as well.  PT is doing so well therapist feels we can decrease to every other week for wound checks at this time.    Wound Therapy - Functional Problem List unable to sit or lie on his back for any prolong period  of time.     Hydrotherapy Plan Dressing change;Patient/family education    Wound Therapy - Frequency Other (comment)   decrease to every other week   Wound Therapy - Current Recommendations PT    Wound Plan See every other week to ensure wound is healing properly.     Dressing  hydrogel on 4x4 and medipore tape.                      PT Short Term Goals - 11/15/19 1340      PT SHORT TERM GOAL #1   Title Pt wound size to be 100% granulated and 50% of the size    Time 3    Period Weeks    Status On-going    Target Date 11/26/19      PT SHORT TERM GOAL #2   Title Pt pain to be no greater than a 6/10 in his Rt buttocks.    Time 3    Period Weeks    Status Achieved             PT Long Term Goals - 11/15/19 1340      PT LONG TERM GOAL #1   Title PT wound to be healed to allow pt to sit down.    Time 6    Period Weeks    Status On-going      PT LONG TERM GOAL #2   Title PT pain to be no greater than a 2/10 to allow pt to sleep  through the night    Time 6    Period Weeks    Status On-going                 Plan - 11/15/19 1339    Clinical Impression Statement see above    Personal Factors and Comorbidities Comorbidity 3+    Comorbidities obesity, s/p Covid, lympedema    Examination-Activity Limitations Dressing;Hygiene/Grooming;Sleep;Sit    Examination-Participation Restrictions Occupation    Stability/Clinical Decision Making Stable/Uncomplicated    Rehab Potential Good    PT Frequency Biweekly    PT Duration 6 weeks    PT Treatment/Interventions Patient/family education;Other (comment);Manual techniques   debridement as needed   PT Next Visit Plan measure wound , cleanse and apply proper dressing    PT Home Exercise Plan glut sets, self manual, increase water and protien intake.           Patient will benefit from skilled therapeutic intervention in order to improve the following deficits and impairments:  Obesity, Decreased skin integrity, Pain  Visit Diagnosis: Left buttock pain  Buttock wound, left, initial encounter     Problem List Patient Active Problem List   Diagnosis Date Noted  . COVID   . Gluteal abscess   . Sepsis due to undetermined organism (HCC) 10/31/2019  . Chronic respiratory failure with hypoxia (HCC) 10/31/2019  . Abscess and cellulitis of gluteal region 10/30/2019  . Acute respiratory disease due to COVID-19 virus 10/22/2019  . E coli bacteremia 10/22/2019  . Hyperglycemia 10/19/2019  . Hypoalbuminemia 10/19/2019  . Pneumonia due to COVID-19 virus 09/29/2019  . Acute respiratory failure with hypoxia due to Covid  09/29/2019  . Transaminitis 09/29/2019  . AKI (acute kidney injury) (HCC) 09/29/2019  . Microcytic anemia 09/29/2019  . Chronic pain syndrome 10/15/2012  . Osteochondral defect of ankle 05/26/2012  . Ankle arthritis 05/26/2012  . Right knee pain 05/26/2012  . Medial meniscus, posterior horn derangement 05/26/2012  . Severe obesity (  BMI >= 40)  (HCC)   . Hypertension     Virgina Organ, [place] CLT [phone] 11/15/2019, 1:42 PM   [facility] [address] [place], [place], [address] Phone: [phone]   Fax:  [phone]  Name: Ricardo Lopez. MRN: [id number] Date of Birth: 1975/02/14

## 2019-11-15 NOTE — Telephone Encounter (Signed)
Blood culture was negative for any E. coli.   I would stay away from sleeping pills if possible until he is breathing better to avoid sedation and worsening hypoxia.

## 2019-11-15 NOTE — Telephone Encounter (Signed)
Pt is improving and will not need to keep this appointment on 11/2  per CR

## 2019-11-16 ENCOUNTER — Ambulatory Visit (HOSPITAL_COMMUNITY)
Admission: RE | Admit: 2019-11-16 | Discharge: 2019-11-16 | Disposition: A | Discharge status: Home / Self Care | Payer: 59 | Source: Ambulatory Visit | Attending: Family Medicine | Admitting: Family Medicine

## 2019-11-16 DIAGNOSIS — J9611 Chronic respiratory failure with hypoxia: Secondary | ICD-10-CM | POA: Diagnosis present

## 2019-11-18 ENCOUNTER — Telehealth: Payer: Self-pay

## 2019-11-18 ENCOUNTER — Ambulatory Visit (HOSPITAL_COMMUNITY): Payer: 59 | Admitting: Physical Therapy

## 2019-11-18 NOTE — Telephone Encounter (Signed)
Ricardo Lopez called about his results for infection, and also he's been having some insomnia. He asked if he could have something called in for it.

## 2019-11-22 NOTE — Telephone Encounter (Signed)
Sent message thru MyChart 

## 2019-11-23 ENCOUNTER — Ambulatory Visit (INDEPENDENT_AMBULATORY_CARE_PROVIDER_SITE_OTHER): Payer: 59 | Admitting: Family Medicine

## 2019-11-23 ENCOUNTER — Other Ambulatory Visit: Payer: Self-pay

## 2019-11-23 ENCOUNTER — Ambulatory Visit (HOSPITAL_COMMUNITY): Payer: 59 | Admitting: Physical Therapy

## 2019-11-23 VITALS — BP 140/84 | HR 108 | Temp 98.2°F | Ht 68.0 in

## 2019-11-23 DIAGNOSIS — J9611 Chronic respiratory failure with hypoxia: Secondary | ICD-10-CM | POA: Diagnosis not present

## 2019-11-23 DIAGNOSIS — L0231 Cutaneous abscess of buttock: Secondary | ICD-10-CM | POA: Diagnosis not present

## 2019-11-23 DIAGNOSIS — Z23 Encounter for immunization: Secondary | ICD-10-CM

## 2019-11-23 DIAGNOSIS — R091 Pleurisy: Secondary | ICD-10-CM | POA: Diagnosis not present

## 2019-11-23 DIAGNOSIS — Z8616 Personal history of COVID-19: Secondary | ICD-10-CM | POA: Diagnosis not present

## 2019-11-23 DIAGNOSIS — U071 COVID-19: Secondary | ICD-10-CM

## 2019-11-23 DIAGNOSIS — L03317 Cellulitis of buttock: Secondary | ICD-10-CM

## 2019-11-23 MED ORDER — ZOLPIDEM TARTRATE 10 MG PO TABS: 10.0000 mg | ORAL_TABLET | Freq: Every evening | ORAL | 0 refills | Status: DC | PRN

## 2019-11-23 MED ORDER — HYDROCODONE-ACETAMINOPHEN 5-325 MG PO TABS
1.0000 | ORAL_TABLET | Freq: Four times a day (QID) | ORAL | 0 refills | Status: DC | PRN
Start: 2019-11-23 — End: 2019-12-31

## 2019-11-23 NOTE — Progress Notes (Signed)
Subjective:    Patient ID: Ricardo Park., male    DOB: April 17, 1975, 44 y.o.   MRN: [id number]  HPI Patient was admitted in September for COVID pneumonia.  Subsequently readmitted for E.coli bacteremia ultimately determined to be due to a perirectal infection.  See most recent discharge summary: Admit date: 10/30/2019 Discharge date: 11/03/2019  Admitted From: Home Disposition: Home  Recommendations for Outpatient Follow-up:  1. Follow up with PCP in 1-2 weeks 2. Please obtain BMP/CBC in one week 3. Follow-up with general surgery as needed 4. He has been referred to outpatient physical therapy for wound care  Home Health: Home health RN Equipment/Devices:  Discharge Condition: Stable CODE STATUS: Full code Diet recommendation: Heart healthy  Brief/Interim Summary: 44 year old male with a history of hypertension, morbid obesity, diabetes mellitus type 2 presenting with increasing pain and drainage from his buttocks. The patient was recently admitted to the hospital from 10/19/2019 to 10/22/2019 secondary to respiratory failure as well as E. coli bacteremia. The patient was discharged home with cefdinir which he finished on 10/30/2019. It was felt that his respiratory failure was likely secondary to complications from his COVID-19 pneumonia in the setting of morbid obesity and OHS. He was discharged home on 2 L nasal cannula. In addition, the patient was also recently discharged after a stay from 09/29/2019 to 10/07/2019 after treatment for a COVID-19 pneumonia. The patient states that he had some gluteal pain even before he left the hospital on 10/22/2019. However, he has noted increasing edema and erythema and pain for the last 3 to 4 days. He is also noted some purulent drainage. He denied any fevers, chills, chest pain, shortness breath, cough, hemoptysis, vomiting, diarrhea, abdominal pain. In the emergency department, the patient had low-grade temperature of 9 9.1 F. He  was mildly tachycardic up to 110. He was hemodynamically stable. Oxygen saturation was 97% on room air. The patient was started on vancomycin and Zosyn. General surgery was consulted to assist with management.  Discharge Diagnoses:  Principal Problem:   Abscess and cellulitis of gluteal region Active Problems:   Severe obesity (BMI >= 40) (HCC)   Acute respiratory failure with hypoxia due to Covid    Microcytic anemia   Sepsis due to undetermined organism (HCC)   Chronic respiratory failure with hypoxia (HCC)  Sepsis -Present on admission -Secondary to gluteal abscess -Presented with tachycardia and WBC up to 16.9 -Lactic acid 1.6 -Follow cultures--VRE+Ecoli -Continue vancomycin and Zosyn  Gluteal abscess -Appreciate surgical consult -Case discussed with Dr. Jorja Loa suspicion of fistula at this time-->treat as gluteal abscess with outpatient follow-up and consideration for MRI as an outpatient if clinically worsens or no improvement -Continue vancomycin and Zosyn -WBC decreasing 16.9>>14.0>>10.8 -NON-surgical setting cultures = VRE and Ecoli -Doubt VRE is true pathogen as patient is improving and WBC improving without active tx against VRE -Treated with vancomycin/Zosyn -Since he is clinically improved, antibiotics transition to doxycycline/Augmentin -He has been referred for outpatient wound care with physical therapy -will need HHRN to help with dressing changes  Controlled diabetes mellitus type 2 -10/19/2019 hemoglobin A1c 7.1 -NovoLog sliding scale  Morbid obesity -BMI 60.82 -Lifestyle modification  Chronic respiratory failure with hypoxia -Stable on 2 L nasal cannula -Encourage incentive spirometry  Iron deficiency anemia -10/20/2019 iron saturation 7% -Ferritin 289 -start iron once infx resolved, as an outpatient  11/09/19 Patient is here today for follow-up.  He has seen wound care every week.  They have appointment to see him tomorrow.  He  states that the wound is improving on his gluteal area and that the pain is getting better.  He denies any fever.  He denies any chills.  He denies any nausea or vomiting.  He does have significant pain in the gluteal area especially when he sits which is primarily the majority of the day.  He is still on oxygen 2 L via nasal cannula.  He is 97% on 2 L but if he takes his oxygen off it drops to 89% even at rest.  He denies any chest pain however he does have shortness of breath with minimal activity.  He also continues to have a nonproductive cough.  Fortunately his lungs sound relatively clear on examination today.  We reviewed his lab work and we discussed his diabetes diagnosis that he was unaware of.  We discussed dietary changes.  We also mentioned that he is severely anemic and that his iron level was low.  I recommended starting iron sulfate 324 mg daily.  At the present time he is not taking any iron supplement.  He denies any pleurisy he does have fibrotic changes in both legs consistent with chronic lymphedema.  He does have edema in both legs to the knees but there is no evidence of a DVT or PE on his exam today.  At that time, my plan was: Discontinue tramadol and replace with Norco 5/325 1-2 p.o. every 6 hours as needed pain.  He can also use Tussionex 1 teaspoon every 12 hours as needed for cough.  Clinically his cellulitis is improving the gluteal region.  He has no signs of systemic illness or bacteremia such as fever, etc.  I will repeat a blood culture today and also check a CBC to monitor for any leukocytosis.  Complete Augmentin and doxycycline as prescribed.  Patient is seeing wound clinic for wound checks and he has an appointment tomorrow.  Repeat chest x-ray in 2 weeks and see the patient back in 2 weeks to reassess weaning off oxygen.  At the present time he is not ready to do so.  At that point I will give the patient a flu shot.  Would recommend a Covid vaccine 90 days after his  infection.  Discussed diabetes and recommended a low carbohydrate diet.  11/23/19 Patient is here today for a checkup.  His oxygen is 93% on room air.  However he states that when he is up walking around, it will drop into the mid 80s.  He does report some pleurisy with deep inspiration and he still has a resting tachycardia of 104 to 108 bpm.  He denies any hemoptysis or purulent sputum.  His cough is getting better.  Chest x-ray looks considerably better compared to the chest x-ray from September 28 although it does have some residual opacities which may reflect scar tissue.  He is due to recheck his CBC to monitor his anemia.  He is on iron tablet.  He reports that he is not sleeping.  He states that he can only get about 2 hours of sleep to 3 hours of sleep and then he is wide-awake.  There is no particular symptom that is keeping him awake is just that he is unable to rest.  He also continues to have pain around the the healing wound on his right gluteus.  I examined the wound today with his wife.  She is doing an excellent job taking care of the wound.  There is a circular patch of granulation tissue roughly  5 cm x 3 cm.  There is no ulcer or cavity.  It is filled in almost completely.  It is still tender to the touch.  There is no surrounding erythema or warmth to suggest cellulitis.  There is no palpable fluctuance.  The wound appears to be healing well.  He is still seeing wound care. Past Medical History:  Diagnosis Date  . COVID    9/21- hypoxia requiring oxygen  . Diabetes mellitus without complication (HCC)   . Gluteal abscess    ecoli after covid hospitalization 2021  . Hypertension   . Obesity    No past surgical history on file. Current Outpatient Medications on File Prior to Visit  Medication Sig Dispense Refill  . acetaminophen (TYLENOL) 325 MG tablet Take 650 mg by mouth every 6 (six) hours as needed.    Marland Kitchen albuterol (VENTOLIN HFA) 108 (90 Base) MCG/ACT inhaler Inhale 2 puffs into  the lungs every 4 (four) hours as needed for wheezing or shortness of breath. 18 g 2  . ascorbic acid (VITAMIN C) 500 MG tablet Take 1 tablet (500 mg total) by mouth daily. 30 tablet 1  . chlorpheniramine-HYDROcodone (TUSSIONEX PENNKINETIC ER) 10-8 MG/5ML SUER Take 5 mLs by mouth every 12 (twelve) hours as needed for cough. 140 mL 0  . chlorpheniramine-HYDROcodone (TUSSIONEX) 10-8 MG/5ML SUER Take 5 mLs by mouth every 12 (twelve) hours as needed for cough. 140 mL 0  . doxycycline (VIBRAMYCIN) 100 MG capsule Take 1 capsule (100 mg total) by mouth 2 (two) times daily. 20 capsule 0  . ferrous sulfate 324 (65 Fe) MG TBEC Take 1 tablet (325 mg total) by mouth every morning. 30 tablet 3  . guaiFENesin-dextromethorphan (ROBITUSSIN DM) 100-10 MG/5ML syrup Take 10 mLs by mouth every 4 (four) hours as needed for cough. 118 mL 0  . HYDROcodone-acetaminophen (NORCO) 5-325 MG tablet Take 1 tablet by mouth every 6 (six) hours as needed for moderate pain. 30 tablet 0  . hydrocortisone (ANUSOL-HC) 25 MG suppository Place 1 suppository (25 mg total) rectally 2 (two) times daily. 12 suppository 0  . Multiple Vitamin (MULTIVITAMIN) tablet Take 1 tablet by mouth daily.    . pantoprazole (PROTONIX) 40 MG tablet Take 1 tablet (40 mg total) by mouth daily. 30 tablet 1  . polyethylene glycol (MIRALAX / GLYCOLAX) 17 g packet Take 17 g by mouth daily. 14 each 0  . senna-docusate (SENOKOT-S) 8.6-50 MG tablet Take 2 tablets by mouth at bedtime. 60 tablet 1  . traMADol (ULTRAM) 50 MG tablet Take 1 tablet (50 mg total) by mouth every 6 (six) hours as needed. 20 tablet 0  . zinc sulfate 220 (50 Zn) MG capsule Take 1 capsule (220 mg total) by mouth daily. 30 capsule 0   No current facility-administered medications on file prior to visit.   No Known Allergies Social History   Socioeconomic History  . Marital status: Single    Spouse name: Not on file  . Number of children: Not on file  . Years of education: Not on file  .  Highest education level: Not on file  Occupational History  . Not on file  Tobacco Use  . Smoking status: Never Smoker  . Smokeless tobacco: Never Used  Vaping Use  . Vaping Use: Never used  Substance and Sexual Activity  . Alcohol use: Yes    Comment: occasional  . Drug use: No  . Sexual activity: Never  Other Topics Concern  . Not on file  Social  History Narrative  . Not on file   Social Determinants of Health   Financial Resource Strain:   . Difficulty of Paying Living Expenses: Not on file  Food Insecurity:   . Worried About Programme researcher, broadcasting/film/video in the Last Year: Not on file  . Ran Out of Food in the Last Year: Not on file  Transportation Needs:   . Lack of Transportation (Medical): Not on file  . Lack of Transportation (Non-Medical): Not on file  Physical Activity:   . Days of Exercise per Week: Not on file  . Minutes of Exercise per Session: Not on file  Stress:   . Feeling of Stress : Not on file  Social Connections:   . Frequency of Communication with Friends and Family: Not on file  . Frequency of Social Gatherings with Friends and Family: Not on file  . Attends Religious Services: Not on file  . Active Member of Clubs or Organizations: Not on file  . Attends Banker Meetings: Not on file  . Marital Status: Not on file  Intimate Partner Violence:   . Fear of Current or Ex-Partner: Not on file  . Emotionally Abused: Not on file  . Physically Abused: Not on file  . Sexually Abused: Not on file    Review of Systems  All other systems reviewed and are negative.      Objective:   Physical Exam Vitals reviewed.  Constitutional:      Appearance: He is obese.  Cardiovascular:     Rate and Rhythm: Normal rate and regular rhythm.     Heart sounds: Normal heart sounds.  Pulmonary:     Effort: Pulmonary effort is normal. No respiratory distress.     Breath sounds: Normal breath sounds. No wheezing, rhonchi or rales.   Genitourinary:   Musculoskeletal:        General: Deformity present.     Right lower leg: Edema present.     Left lower leg: Edema present.  Neurological:     Mental Status: He is alert.           Assessment & Plan:  Pleurisy - Plan: CBC with Differential/Platelet, COMPLETE METABOLIC PANEL WITH GFR, D-dimer, quantitative (not at [facility])  Chronic respiratory failure with hypoxia (HCC)  COVID  Abscess and cellulitis of gluteal region  Given his tachycardia and pleurisy and shortness of breath I will check a D-dimer to evaluate for possible pulmonary embolism given his recent Covid.  If D-dimer is positive I would recommend a CT angiogram of the chest.  Recheck CBC to monitor his anemia.  Also recheck a CMP to monitor his glucose and kidney function.  However I am pleased by his breath sounds today and the improvement on his chest x-ray.  I hope that over the next 3 to 4 weeks he will be able to wean off oxygen completely.  I will give him Ambien 10 mg p.o. nightly as needed insomnia but I encouraged him to try to wean away from the pain medication and not to take the pain medication at the same time as the Ambien.  Also gave the patient compression hose prescription for knee-high compression hose 15 to 20 mmHg to help with the lymphedema in his legs.  As a side note, the patient reports numbness and tingling in both hands distal to the wrist with pain in his fingers.  I suspect carpal tunnel syndrome.  We discussed nerve conduction studies but we will defer that at the  present time.

## 2019-11-23 NOTE — Addendum Note (Signed)
Addended by: Lynnea Ferrier T on: 11/23/2019 02:57 PM   Modules accepted: Orders

## 2019-11-24 ENCOUNTER — Other Ambulatory Visit: Payer: Self-pay | Admitting: *Deleted

## 2019-11-24 ENCOUNTER — Ambulatory Visit
Admission: RE | Admit: 2019-11-24 | Discharge: 2019-11-24 | Disposition: A | Discharge status: Home / Self Care | Payer: 59 | Source: Ambulatory Visit | Attending: Family Medicine | Admitting: Family Medicine

## 2019-11-24 DIAGNOSIS — R079 Chest pain, unspecified: Secondary | ICD-10-CM

## 2019-11-24 LAB — COMPLETE METABOLIC PANEL WITH GFR
AG Ratio: 1.2 (calc) (ref 1.0–2.5)
ALT: 14 U/L (ref 9–46)
AST: 13 U/L (ref 10–40)
Albumin: 3.7 g/dL (ref 3.6–5.1)
Alkaline phosphatase (APISO): 72 U/L (ref 36–130)
BUN: 14 mg/dL (ref 7–25)
CO2: 28 mmol/L (ref 20–32)
Calcium: 9.6 mg/dL (ref 8.6–10.3)
Chloride: 102 mmol/L (ref 98–110)
Creat: 0.99 mg/dL (ref 0.60–1.35)
GFR, Est African American: 108 mL/min/{1.73_m2} (ref >=60)
GFR, Est Non African American: 93 mL/min/{1.73_m2} (ref >=60)
Globulin: 3.2 g/dL (calc) (ref 1.9–3.7)
Glucose, Bld: 95 mg/dL (ref 65–99)
Potassium: 4.4 mmol/L (ref 3.5–5.3)
Sodium: 139 mmol/L (ref 135–146)
Total Bilirubin: 0.3 mg/dL (ref 0.2–1.2)
Total Protein: 6.9 g/dL (ref 6.1–8.1)

## 2019-11-24 LAB — CBC WITH DIFFERENTIAL/PLATELET
Absolute Monocytes: 725 cells/uL (ref 200–950)
Basophils Absolute: 48 cells/uL (ref 0–200)
Basophils Relative: 0.7 %
Eosinophils Absolute: 48 cells/uL (ref 15–500)
Eosinophils Relative: 0.7 %
HCT: 30.4 % — ABNORMAL LOW (ref 38.5–50.0)
Hemoglobin: 9 g/dL — ABNORMAL LOW (ref 13.2–17.1)
Lymphs Abs: 2629 cells/uL (ref 850–3900)
MCH: 20 pg — ABNORMAL LOW (ref 27.0–33.0)
MCHC: 29.6 g/dL — ABNORMAL LOW (ref 32.0–36.0)
MCV: 67.7 fL — ABNORMAL LOW (ref 80.0–100.0)
MPV: 11.7 fL (ref 7.5–12.5)
Monocytes Relative: 10.5 %
Neutro Abs: 3450 cells/uL (ref 1500–7800)
Neutrophils Relative %: 50 %
Platelets: 318 10*3/uL (ref 140–400)
RBC: 4.49 10*6/uL (ref 4.20–5.80)
RDW: 17.9 % — ABNORMAL HIGH (ref 11.0–15.0)
Total Lymphocyte: 38.1 %
WBC: 6.9 10*3/uL (ref 3.8–10.8)

## 2019-11-24 LAB — D-DIMER, QUANTITATIVE: D-Dimer, Quant: 1.06 mcg/mL FEU — ABNORMAL HIGH (ref <0.50)

## 2019-11-24 LAB — CBC MORPHOLOGY

## 2019-11-24 MED ORDER — IOPAMIDOL (ISOVUE-370) INJECTION 76%
75.0000 mL | Freq: Once | INTRAVENOUS | Status: AC | PRN
  Administered 2019-11-24: 75 mL via INTRAVENOUS

## 2019-11-25 ENCOUNTER — Encounter: Payer: Self-pay | Admitting: Family Medicine

## 2019-11-25 ENCOUNTER — Other Ambulatory Visit: Payer: Self-pay | Admitting: Family Medicine

## 2019-11-25 ENCOUNTER — Ambulatory Visit (HOSPITAL_COMMUNITY): Payer: 59 | Admitting: Physical Therapy

## 2019-11-25 ENCOUNTER — Telehealth: Payer: Self-pay

## 2019-11-25 DIAGNOSIS — I712 Thoracic aortic aneurysm, without rupture, unspecified: Secondary | ICD-10-CM | POA: Insufficient documentation

## 2019-11-25 MED ORDER — BENZONATATE 100 MG PO CAPS: 200.0000 mg | ORAL_CAPSULE | Freq: Three times a day (TID) | ORAL | 0 refills | Status: DC | PRN

## 2019-11-25 NOTE — Telephone Encounter (Signed)
I sent tessalon perles to pharmacy.

## 2019-11-25 NOTE — Telephone Encounter (Signed)
Pt requested something for his coughing ?

## 2019-11-30 ENCOUNTER — Ambulatory Visit (HOSPITAL_COMMUNITY): Payer: 59 | Admitting: Physical Therapy

## 2019-12-02 ENCOUNTER — Other Ambulatory Visit: Payer: Self-pay

## 2019-12-02 ENCOUNTER — Ambulatory Visit (HOSPITAL_COMMUNITY): Payer: 59 | Attending: Internal Medicine | Admitting: Physical Therapy

## 2019-12-02 DIAGNOSIS — S31829A Unspecified open wound of left buttock, initial encounter: Secondary | ICD-10-CM | POA: Insufficient documentation

## 2019-12-02 DIAGNOSIS — M7918 Myalgia, other site: Secondary | ICD-10-CM

## 2019-12-02 NOTE — Therapy (Signed)
Ricardo Lopez, [place], [address] Phone: [phone]   Fax:  [phone]  Wound Care Therapy  Patient Details  Name: Ricardo Lopez. MRN: [id number] Date of Birth: 26-Apr-1975 Referring Provider (PT): Kathie Dike  PHYSICAL THERAPY DISCHARGE SUMMARY  Visits from Start of Care: 4 Current functional level related to goals / functional outcomes: See below    Remaining deficits: See below    Education / Equipment: Cleans, massage, exercise and wound dressing   Plan: Patient agrees to discharge.  Patient goals were partially met. Patient is being discharged due to being pleased with the current functional level.  ?????     Encounter Date: 12/02/2019   PT End of Session - 12/02/19 1637    Visit Number 4    Number of Visits 4    Date for PT Re-Evaluation 12/17/19    Authorization Type [employer]    Authorization - Visit Number 4    Authorization - Number of Visits 60    Progress Note Due on Visit 4    Activity Tolerance Patient tolerated treatment well    Behavior During Therapy [facility] for tasks assessed/performed           Past Medical History:  Diagnosis Date  . COVID    9/21- hypoxia requiring oxygen  . Diabetes mellitus without complication ([place])   . Gluteal abscess    ecoli after covid hospitalization 2021  . Hypertension   . Obesity   . Thoracic aortic aneurysm (HCC)     No past surgical history on file.  There were no vitals filed for this visit.               Wound Therapy - 12/02/19 0001    Subjective Pt states that his wound is itching     Patient and Family Stated Goals no wound     Date of Onset 09/29/19    Prior Treatments IV antibiotic, nursing and home care.     Pain Scale 0-10    Pain Score 0-No pain    Evaluation and Treatment Procedures Explained to Patient/Family Yes    Evaluation and Treatment Procedures agreed to    Wound Properties Date First Assessed: 10/31/19  Time First Assessed: 2595 Wound Type: Other (Comment) , gluteal abscess  Location: Buttocks Location Orientation: Right Wound Description (Comments): open abscess with purulent drainage Present on Admission: Yes   Dressing Type Gauze (Comment)    Dressing Changed Changed    Dressing Status Old drainage    Dressing Change Frequency PRN    Site / Wound Assessment Red    % Wound base Red or Granulating 100%    Wound Length (cm) 4 cm   was 6.8   Wound Width (cm) 1.8 cm   was 3.8   Wound Depth (cm) 0 cm   was 2.5    Wound Volume (cm^3) 0 cm^3    Wound Surface Area (cm^2) 7.2 cm^2    Drainage Amount Scant    Drainage Description Serous    Treatment Cleansed    Wound Therapy - Clinical Statement Pt wound has filled in significantly and edges are approximating well.  There is no debridement to be done and pt wife is confident with caring for the wound.  Wound no longer needs skilled care at this time will discharge to family care.     Wound Therapy - Functional Problem List limited sitting     Wound Plan Discharge at this  time due to no skilled care being needed.     Dressing  hydrogel on 4x4 and medipore tape.                      PT Short Term Goals - 12/02/19 1639      PT SHORT TERM GOAL #1   Title Pt wound size to be 100% granulated and 50% of the size    Time 3    Period Weeks    Status Achieved    Target Date 11/26/19      PT SHORT TERM GOAL #2   Title Pt pain to be no greater than a 6/10 in his Rt buttocks.    Time 3    Period Weeks    Status Achieved             PT Long Term Goals - 12/02/19 1639      PT LONG TERM GOAL #1   Title PT wound to be healed to allow pt to sit down.    Time 6    Period Weeks    Status Partially Met   able to sit for short periods of time now.     PT LONG TERM GOAL #2   Title PT pain to be no greater than a 2/10 to allow pt to sleep through the night    Time 6    Period Weeks    Status Achieved                 Plan  - 12/02/19 1638    Personal Factors and Comorbidities Comorbidity 3+    Comorbidities obesity, s/p Covid, lympedema    Examination-Activity Limitations Dressing;Hygiene/Grooming;Sleep;Sit    Examination-Participation Restrictions Occupation    Stability/Clinical Decision Making Stable/Uncomplicated    Rehab Potential Good    PT Frequency Biweekly    PT Duration 6 weeks    PT Treatment/Interventions Patient/family education;Other (comment);Manual techniques   debridement as needed   PT Next Visit Plan measure wound , cleanse and apply proper dressing    PT Home Exercise Plan glut sets, self manual, increase water and protien intake.           Patient will benefit from skilled therapeutic intervention in order to improve the following deficits and impairments:  Obesity, Decreased skin integrity, Pain  Visit Diagnosis: Left buttock pain  Buttock wound, left, initial encounter     Problem List Patient Active Problem List   Diagnosis Date Noted  . Thoracic aortic aneurysm ([place])   . COVID   . Gluteal abscess   . Sepsis due to undetermined organism ([place]) 10/31/2019  . Chronic respiratory failure with hypoxia ([place]) 10/31/2019  . Abscess and cellulitis of gluteal region 10/30/2019  . Acute respiratory disease due to COVID-19 virus 10/22/2019  . E coli bacteremia 10/22/2019  . Hyperglycemia 10/19/2019  . Hypoalbuminemia 10/19/2019  . Pneumonia due to COVID-19 virus 09/29/2019  . Acute respiratory failure with hypoxia due to Covid  09/29/2019  . Transaminitis 09/29/2019  . AKI (acute kidney injury) ([place]) 09/29/2019  . Microcytic anemia 09/29/2019  . Chronic pain syndrome 10/15/2012  . Osteochondral defect of ankle 05/26/2012  . Ankle arthritis 05/26/2012  . Right knee pain 05/26/2012  . Medial meniscus, posterior horn derangement 05/26/2012  . Severe obesity (BMI >= 40) (HCC)   . Hypertension    Ricardo Lopez, [place] CLT [phone] 12/02/2019, 4:40 PM  [facility] [address]  [place], [place], [address] Phone: [phone]   Fax:  [phone]  Name: Ricardo Lopez. MRN: [id number] Date of Birth: November 27, 1975

## 2019-12-21 ENCOUNTER — Ambulatory Visit (INDEPENDENT_AMBULATORY_CARE_PROVIDER_SITE_OTHER): Payer: 59 | Admitting: Family Medicine

## 2019-12-21 ENCOUNTER — Other Ambulatory Visit: Payer: Self-pay

## 2019-12-21 VITALS — BP 150/84 | HR 105 | Temp 98.6°F | Ht 68.0 in | Wt >= 6400 oz

## 2019-12-21 DIAGNOSIS — F411 Generalized anxiety disorder: Secondary | ICD-10-CM | POA: Diagnosis not present

## 2019-12-21 DIAGNOSIS — M7989 Other specified soft tissue disorders: Secondary | ICD-10-CM

## 2019-12-21 DIAGNOSIS — I509 Heart failure, unspecified: Secondary | ICD-10-CM | POA: Diagnosis not present

## 2019-12-21 MED ORDER — FUROSEMIDE 40 MG PO TABS: 40.0000 mg | ORAL_TABLET | Freq: Two times a day (BID) | ORAL | 3 refills | Status: DC

## 2019-12-21 MED ORDER — ESCITALOPRAM OXALATE 10 MG PO TABS: 10.0000 mg | ORAL_TABLET | Freq: Every day | ORAL | 1 refills | Status: DC

## 2019-12-21 MED ORDER — POTASSIUM CHLORIDE CRYS ER 20 MEQ PO TBCR: 20.0000 meq | EXTENDED_RELEASE_TABLET | Freq: Every day | ORAL | 1 refills | Status: DC

## 2019-12-21 NOTE — Progress Notes (Signed)
Subjective:    Patient ID: Ricardo Lopez., male    DOB: 1975/08/07, 44 y.o.   MRN: [id number]  HPI Patient was admitted in September for COVID pneumonia.  Subsequently readmitted for E.coli bacteremia ultimately determined to be due to a perirectal infection.  See most recent discharge summary: Admit date: 10/30/2019 Discharge date: 11/03/2019  Admitted From: Home Disposition: Home  Recommendations for Outpatient Follow-up:  1. Follow up with PCP in 1-2 weeks 2. Please obtain BMP/CBC in one week 3. Follow-up with general surgery as needed 4. He has been referred to outpatient physical therapy for wound care  Home Health: Home health RN Equipment/Devices:  Discharge Condition: Stable CODE STATUS: Full code Diet recommendation: Heart healthy  Brief/Interim Summary: 44 year old male with a history of hypertension, morbid obesity, diabetes mellitus type 2 presenting with increasing pain and drainage from his buttocks. The patient was recently admitted to the hospital from 10/19/2019 to 10/22/2019 secondary to respiratory failure as well as E. coli bacteremia. The patient was discharged home with cefdinir which he finished on 10/30/2019. It was felt that his respiratory failure was likely secondary to complications from his COVID-19 pneumonia in the setting of morbid obesity and OHS. He was discharged home on 2 L nasal cannula. In addition, the patient was also recently discharged after a stay from 09/29/2019 to 10/07/2019 after treatment for a COVID-19 pneumonia. The patient states that he had some gluteal pain even before he left the hospital on 10/22/2019. However, he has noted increasing edema and erythema and pain for the last 3 to 4 days. He is also noted some purulent drainage. He denied any fevers, chills, chest pain, shortness breath, cough, hemoptysis, vomiting, diarrhea, abdominal pain. In the emergency department, the patient had low-grade temperature of 9 9.1 F. He  was mildly tachycardic up to 110. He was hemodynamically stable. Oxygen saturation was 97% on room air. The patient was started on vancomycin and Zosyn. General surgery was consulted to assist with management.  Discharge Diagnoses:  Principal Problem:   Abscess and cellulitis of gluteal region Active Problems:   Severe obesity (BMI >= 40) (HCC)   Acute respiratory failure with hypoxia due to Covid    Microcytic anemia   Sepsis due to undetermined organism (HCC)   Chronic respiratory failure with hypoxia (HCC)  Sepsis -Present on admission -Secondary to gluteal abscess -Presented with tachycardia and WBC up to 16.9 -Lactic acid 1.6 -Follow cultures--VRE+Ecoli -Continue vancomycin and Zosyn  Gluteal abscess -Appreciate surgical consult -Case discussed with Dr. Jorja Loa suspicion of fistula at this time-->treat as gluteal abscess with outpatient follow-up and consideration for MRI as an outpatient if clinically worsens or no improvement -Continue vancomycin and Zosyn -WBC decreasing 16.9>>14.0>>10.8 -NON-surgical setting cultures = VRE and Ecoli -Doubt VRE is true pathogen as patient is improving and WBC improving without active tx against VRE -Treated with vancomycin/Zosyn -Since he is clinically improved, antibiotics transition to doxycycline/Augmentin -He has been referred for outpatient wound care with physical therapy -will need HHRN to help with dressing changes  Controlled diabetes mellitus type 2 -10/19/2019 hemoglobin A1c 7.1 -NovoLog sliding scale  Morbid obesity -BMI 60.82 -Lifestyle modification  Chronic respiratory failure with hypoxia -Stable on 2 L nasal cannula -Encourage incentive spirometry  Iron deficiency anemia -10/20/2019 iron saturation 7% -Ferritin 289 -start iron once infx resolved, as an outpatient  11/09/19 Patient is here today for follow-up.  He has seen wound care every week.  They have appointment to see him tomorrow.  He  states that the wound is improving on his gluteal area and that the pain is getting better.  He denies any fever.  He denies any chills.  He denies any nausea or vomiting.  He does have significant pain in the gluteal area especially when he sits which is primarily the majority of the day.  He is still on oxygen 2 L via nasal cannula.  He is 97% on 2 L but if he takes his oxygen off it drops to 89% even at rest.  He denies any chest pain however he does have shortness of breath with minimal activity.  He also continues to have a nonproductive cough.  Fortunately his lungs sound relatively clear on examination today.  We reviewed his lab work and we discussed his diabetes diagnosis that he was unaware of.  We discussed dietary changes.  We also mentioned that he is severely anemic and that his iron level was low.  I recommended starting iron sulfate 324 mg daily.  At the present time he is not taking any iron supplement.  He denies any pleurisy he does have fibrotic changes in both legs consistent with chronic lymphedema.  He does have edema in both legs to the knees but there is no evidence of a DVT or PE on his exam today.  At that time, my plan was: Discontinue tramadol and replace with Norco 5/325 1-2 p.o. every 6 hours as needed pain.  He can also use Tussionex 1 teaspoon every 12 hours as needed for cough.  Clinically his cellulitis is improving the gluteal region.  He has no signs of systemic illness or bacteremia such as fever, etc.  I will repeat a blood culture today and also check a CBC to monitor for any leukocytosis.  Complete Augmentin and doxycycline as prescribed.  Patient is seeing wound clinic for wound checks and he has an appointment tomorrow.  Repeat chest x-ray in 2 weeks and see the patient back in 2 weeks to reassess weaning off oxygen.  At the present time he is not ready to do so.  At that point I will give the patient a flu shot.  Would recommend a Covid vaccine 90 days after his  infection.  Discussed diabetes and recommended a low carbohydrate diet.  11/23/19 Patient is here today for a checkup.  His oxygen is 93% on room air.  However he states that when he is up walking around, it will drop into the mid 80s.  He does report some pleurisy with deep inspiration and he still has a resting tachycardia of 104 to 108 bpm.  He denies any hemoptysis or purulent sputum.  His cough is getting better.  Chest x-ray looks considerably better compared to the chest x-ray from September 28 although it does have some residual opacities which may reflect scar tissue.  He is due to recheck his CBC to monitor his anemia.  He is on iron tablet.  He reports that he is not sleeping.  He states that he can only get about 2 hours of sleep to 3 hours of sleep and then he is wide-awake.  There is no particular symptom that is keeping him awake is just that he is unable to rest.  He also continues to have pain around the the healing wound on his right gluteus.  I examined the wound today with his wife.  She is doing an excellent job taking care of the wound.  There is a circular patch of granulation tissue roughly  5 cm x 3 cm.  There is no ulcer or cavity.  It is filled in almost completely.  It is still tender to the touch.  There is no surrounding erythema or warmth to suggest cellulitis.  There is no palpable fluctuance.  The wound appears to be healing well.  He is still seeing wound care.  At that time, my plan was: Given his tachycardia and pleurisy and shortness of breath I will check a D-dimer to evaluate for possible pulmonary embolism given his recent Covid.  If D-dimer is positive I would recommend a CT angiogram of the chest.  Recheck CBC to monitor his anemia.  Also recheck a CMP to monitor his glucose and kidney function.  However I am pleased by his breath sounds today and the improvement on his chest x-ray.  I hope that over the next 3 to 4 weeks he will be able to wean off oxygen completely.  I  will give him Ambien 10 mg p.o. nightly as needed insomnia but I encouraged him to try to wean away from the pain medication and not to take the pain medication at the same time as the Ambien.  Also gave the patient compression hose prescription for knee-high compression hose 15 to 20 mmHg to help with the lymphedema in his legs.  As a side note, the patient reports numbness and tingling in both hands distal to the wrist with pain in his fingers.  I suspect carpal tunnel syndrome.  We discussed nerve conduction studies but we will defer that at the present time.  12/21/19 Patient has rapidly gained weight.  His weight is up over 50 pounds since I last saw him.  His legs are swollen and tight and tender to palpation.  He also reports shortness of breath with activity.  He had a positive D-dimer at his last visit so we did obtain a CT scan but no pulmonary embolism was found only residual inflammation in the lungs from his Covid pneumonia.  However I am concerned about right-sided heart failure due to this as well as due to his morbid obesity.  I believe the patient likely has diastolic heart failure even though he had a normal echocardiogram in September 29.  He denies any chest pain.  He does have some orthopnea.  He also reports depression and anxiety.  He has having trouble sleeping.  He reports panic attacks.  He states that if his wife leaves him alone he panics.  She states that if he wakes up and she is not in the home he will panic. Past Medical History:  Diagnosis Date  . COVID    9/21- hypoxia requiring oxygen  . Diabetes mellitus without complication (HCC)   . Gluteal abscess    ecoli after covid hospitalization 2021  . Hypertension   . Obesity   . Thoracic aortic aneurysm (HCC)    No past surgical history on file. Current Outpatient Medications on File Prior to Visit  Medication Sig Dispense Refill  . acetaminophen (TYLENOL) 325 MG tablet Take 650 mg by mouth every 6 (six) hours as  needed.    Marland Kitchen albuterol (VENTOLIN HFA) 108 (90 Base) MCG/ACT inhaler Inhale 2 puffs into the lungs every 4 (four) hours as needed for wheezing or shortness of breath. 18 g 2  . ascorbic acid (VITAMIN C) 500 MG tablet Take 1 tablet (500 mg total) by mouth daily. 30 tablet 1  . benzonatate (TESSALON) 100 MG capsule Take 2 capsules (200 mg total)  by mouth 3 (three) times daily as needed for cough. 30 capsule 0  . ferrous sulfate 324 (65 Fe) MG TBEC Take 1 tablet (325 mg total) by mouth every morning. 30 tablet 3  . HYDROcodone-acetaminophen (NORCO) 5-325 MG tablet Take 1 tablet by mouth every 6 (six) hours as needed for moderate pain. 30 tablet 0  . Multiple Vitamin (MULTIVITAMIN) tablet Take 1 tablet by mouth daily.    . pantoprazole (PROTONIX) 40 MG tablet Take 1 tablet (40 mg total) by mouth daily. 30 tablet 1  . polyethylene glycol (MIRALAX / GLYCOLAX) 17 g packet Take 17 g by mouth daily. 14 each 0  . senna-docusate (SENOKOT-S) 8.6-50 MG tablet Take 2 tablets by mouth at bedtime. 60 tablet 1  . zinc sulfate 220 (50 Zn) MG capsule Take 1 capsule (220 mg total) by mouth daily. 30 capsule 0  . zolpidem (AMBIEN) 10 MG tablet Take 1 tablet (10 mg total) by mouth at bedtime as needed for sleep. 30 tablet 0   No current facility-administered medications on file prior to visit.   No Known Allergies Social History   Socioeconomic History  . Marital status: Single    Spouse name: Not on file  . Number of children: Not on file  . Years of education: Not on file  . Highest education level: Not on file  Occupational History  . Not on file  Tobacco Use  . Smoking status: Never Smoker  . Smokeless tobacco: Never Used  Vaping Use  . Vaping Use: Never used  Substance and Sexual Activity  . Alcohol use: Yes    Comment: occasional  . Drug use: No  . Sexual activity: Never  Other Topics Concern  . Not on file  Social History Narrative  . Not on file   Social Determinants of Health   Financial  Resource Strain:   . Difficulty of Paying Living Expenses: Not on file  Food Insecurity:   . Worried About Programme researcher, broadcasting/film/video in the Last Year: Not on file  . Ran Out of Food in the Last Year: Not on file  Transportation Needs:   . Lack of Transportation (Medical): Not on file  . Lack of Transportation (Non-Medical): Not on file  Physical Activity:   . Days of Exercise per Week: Not on file  . Minutes of Exercise per Session: Not on file  Stress:   . Feeling of Stress : Not on file  Social Connections:   . Frequency of Communication with Friends and Family: Not on file  . Frequency of Social Gatherings with Friends and Family: Not on file  . Attends Religious Services: Not on file  . Active Member of Clubs or Organizations: Not on file  . Attends Banker Meetings: Not on file  . Marital Status: Not on file  Intimate Partner Violence:   . Fear of Current or Ex-Partner: Not on file  . Emotionally Abused: Not on file  . Physically Abused: Not on file  . Sexually Abused: Not on file    Review of Systems  All other systems reviewed and are negative.      Objective:   Physical Exam Vitals reviewed.  Constitutional:      Appearance: He is obese.  Cardiovascular:     Rate and Rhythm: Normal rate and regular rhythm.     Heart sounds: Normal heart sounds.  Pulmonary:     Effort: Pulmonary effort is normal. No respiratory distress.     Breath  sounds: Normal breath sounds. No wheezing, rhonchi or rales.  Musculoskeletal:        General: Deformity present.     Right lower leg: Edema present.     Left lower leg: Edema present.  Neurological:     Mental Status: He is alert.    Wt Readings from Last 3 Encounters:  11/09/19 (!) 383 lb 9.6 oz (174 kg)  10/30/19 (!) 400 lb (181.4 kg)  10/19/19 (!) 350 lb (158.8 kg)          Assessment & Plan:  Leg swelling - Plan: Brain natriuretic peptide, CBC with Differential/Platelet, COMPLETE METABOLIC PANEL WITH  GFR  Acute heart failure, unspecified heart failure type (HCC)  GAD (generalized anxiety disorder)  Patient clearly is fluid overloaded.  I believe the patient has acute diastolic heart failure or right-sided heart failure due to his underlying lung damage.  Begin Lasix 40 mg twice daily along with K-Dur 20 milliequivalents daily.  Check BNP, CBC, CMP.  Reassess weight and renal function in 1 week and titrate diuretics to achieve the desired effect.  Meanwhile start Lexapro for GAD 10 mg a day.

## 2019-12-22 LAB — CBC WITH DIFFERENTIAL/PLATELET
Absolute Monocytes: 657 cells/uL (ref 200–950)
Basophils Absolute: 33 cells/uL (ref 0–200)
Basophils Relative: 0.5 %
Eosinophils Absolute: 91 cells/uL (ref 15–500)
Eosinophils Relative: 1.4 %
HCT: 28.9 % — ABNORMAL LOW (ref 38.5–50.0)
Hemoglobin: 8.8 g/dL — ABNORMAL LOW (ref 13.2–17.1)
Lymphs Abs: 1814 cells/uL (ref 850–3900)
MCH: 21.1 pg — ABNORMAL LOW (ref 27.0–33.0)
MCHC: 30.4 g/dL — ABNORMAL LOW (ref 32.0–36.0)
MCV: 69.3 fL — ABNORMAL LOW (ref 80.0–100.0)
Monocytes Relative: 10.1 %
Neutro Abs: 3907 cells/uL (ref 1500–7800)
Neutrophils Relative %: 60.1 %
Platelets: 216 10*3/uL (ref 140–400)
RBC: 4.17 10*6/uL — ABNORMAL LOW (ref 4.20–5.80)
RDW: 16.8 % — ABNORMAL HIGH (ref 11.0–15.0)
Total Lymphocyte: 27.9 %
WBC: 6.5 10*3/uL (ref 3.8–10.8)

## 2019-12-22 LAB — COMPLETE METABOLIC PANEL WITH GFR
AG Ratio: 1.3 (calc) (ref 1.0–2.5)
ALT: 35 U/L (ref 9–46)
AST: 28 U/L (ref 10–40)
Albumin: 3.7 g/dL (ref 3.6–5.1)
Alkaline phosphatase (APISO): 61 U/L (ref 36–130)
BUN: 12 mg/dL (ref 7–25)
CO2: 31 mmol/L (ref 20–32)
Calcium: 9.5 mg/dL (ref 8.6–10.3)
Chloride: 106 mmol/L (ref 98–110)
Creat: 1.01 mg/dL (ref 0.60–1.35)
GFR, Est African American: 104 mL/min/{1.73_m2} (ref >=60)
GFR, Est Non African American: 90 mL/min/{1.73_m2} (ref >=60)
Globulin: 2.9 g/dL (calc) (ref 1.9–3.7)
Glucose, Bld: 114 mg/dL — ABNORMAL HIGH (ref 65–99)
Potassium: 4 mmol/L (ref 3.5–5.3)
Sodium: 144 mmol/L (ref 135–146)
Total Bilirubin: 0.3 mg/dL (ref 0.2–1.2)
Total Protein: 6.6 g/dL (ref 6.1–8.1)

## 2019-12-22 LAB — BRAIN NATRIURETIC PEPTIDE: Brain Natriuretic Peptide: <4 pg/mL (ref <100)

## 2019-12-22 LAB — CBC MORPHOLOGY

## 2019-12-27 ENCOUNTER — Other Ambulatory Visit: Payer: Self-pay | Admitting: Family Medicine

## 2019-12-27 NOTE — Telephone Encounter (Signed)
Ok to refill??  Last office visit 12/21/2019.  Last refill 11/23/2019.  Ok to add refills on prescription?

## 2019-12-31 ENCOUNTER — Ambulatory Visit (INDEPENDENT_AMBULATORY_CARE_PROVIDER_SITE_OTHER): Payer: 59 | Admitting: Family Medicine

## 2019-12-31 ENCOUNTER — Other Ambulatory Visit: Payer: Self-pay

## 2019-12-31 VITALS — BP 140/90 | HR 113 | Temp 99.0°F | Ht 68.0 in | Wt >= 6400 oz

## 2019-12-31 DIAGNOSIS — I509 Heart failure, unspecified: Secondary | ICD-10-CM | POA: Diagnosis not present

## 2019-12-31 DIAGNOSIS — M7989 Other specified soft tissue disorders: Secondary | ICD-10-CM

## 2019-12-31 DIAGNOSIS — J9611 Chronic respiratory failure with hypoxia: Secondary | ICD-10-CM | POA: Diagnosis not present

## 2019-12-31 MED ORDER — METOPROLOL SUCCINATE ER 25 MG PO TB24: 25.0000 mg | ORAL_TABLET | Freq: Every day | ORAL | 3 refills | Status: DC

## 2019-12-31 MED ORDER — HYDROCODONE-ACETAMINOPHEN 5-325 MG PO TABS: 1.0000 | ORAL_TABLET | Freq: Four times a day (QID) | ORAL | 0 refills | Status: DC | PRN

## 2019-12-31 NOTE — Progress Notes (Signed)
Subjective:    Patient ID: Ricardo Park., male    DOB: 1975/08/07, 44 y.o.   MRN: [id number]  HPI Patient was admitted in September for COVID pneumonia.  Subsequently readmitted for E.coli bacteremia ultimately determined to be due to a perirectal infection.  See most recent discharge summary: Admit date: 10/30/2019 Discharge date: 11/03/2019  Admitted From: Home Disposition: Home  Recommendations for Outpatient Follow-up:  1. Follow up with PCP in 1-2 weeks 2. Please obtain BMP/CBC in one week 3. Follow-up with general surgery as needed 4. He has been referred to outpatient physical therapy for wound care  Home Health: Home health RN Equipment/Devices:  Discharge Condition: Stable CODE STATUS: Full code Diet recommendation: Heart healthy  Brief/Interim Summary: 44 year old male with a history of hypertension, morbid obesity, diabetes mellitus type 2 presenting with increasing pain and drainage from his buttocks. The patient was recently admitted to the hospital from 10/19/2019 to 10/22/2019 secondary to respiratory failure as well as E. coli bacteremia. The patient was discharged home with cefdinir which he finished on 10/30/2019. It was felt that his respiratory failure was likely secondary to complications from his COVID-19 pneumonia in the setting of morbid obesity and OHS. He was discharged home on 2 L nasal cannula. In addition, the patient was also recently discharged after a stay from 09/29/2019 to 10/07/2019 after treatment for a COVID-19 pneumonia. The patient states that he had some gluteal pain even before he left the hospital on 10/22/2019. However, he has noted increasing edema and erythema and pain for the last 3 to 4 days. He is also noted some purulent drainage. He denied any fevers, chills, chest pain, shortness breath, cough, hemoptysis, vomiting, diarrhea, abdominal pain. In the emergency department, the patient had low-grade temperature of 9 9.1 F. He  was mildly tachycardic up to 110. He was hemodynamically stable. Oxygen saturation was 97% on room air. The patient was started on vancomycin and Zosyn. General surgery was consulted to assist with management.  Discharge Diagnoses:  Principal Problem:   Abscess and cellulitis of gluteal region Active Problems:   Severe obesity (BMI >= 40) (HCC)   Acute respiratory failure with hypoxia due to Covid    Microcytic anemia   Sepsis due to undetermined organism (HCC)   Chronic respiratory failure with hypoxia (HCC)  Sepsis -Present on admission -Secondary to gluteal abscess -Presented with tachycardia and WBC up to 16.9 -Lactic acid 1.6 -Follow cultures--VRE+Ecoli -Continue vancomycin and Zosyn  Gluteal abscess -Appreciate surgical consult -Case discussed with Dr. Jorja Loa suspicion of fistula at this time-->treat as gluteal abscess with outpatient follow-up and consideration for MRI as an outpatient if clinically worsens or no improvement -Continue vancomycin and Zosyn -WBC decreasing 16.9>>14.0>>10.8 -NON-surgical setting cultures = VRE and Ecoli -Doubt VRE is true pathogen as patient is improving and WBC improving without active tx against VRE -Treated with vancomycin/Zosyn -Since he is clinically improved, antibiotics transition to doxycycline/Augmentin -He has been referred for outpatient wound care with physical therapy -will need HHRN to help with dressing changes  Controlled diabetes mellitus type 2 -10/19/2019 hemoglobin A1c 7.1 -NovoLog sliding scale  Morbid obesity -BMI 60.82 -Lifestyle modification  Chronic respiratory failure with hypoxia -Stable on 2 L nasal cannula -Encourage incentive spirometry  Iron deficiency anemia -10/20/2019 iron saturation 7% -Ferritin 289 -start iron once infx resolved, as an outpatient  11/09/19 Patient is here today for follow-up.  He has seen wound care every week.  They have appointment to see him tomorrow.  He  states that the wound is improving on his gluteal area and that the pain is getting better.  He denies any fever.  He denies any chills.  He denies any nausea or vomiting.  He does have significant pain in the gluteal area especially when he sits which is primarily the majority of the day.  He is still on oxygen 2 L via nasal cannula.  He is 97% on 2 L but if he takes his oxygen off it drops to 89% even at rest.  He denies any chest pain however he does have shortness of breath with minimal activity.  He also continues to have a nonproductive cough.  Fortunately his lungs sound relatively clear on examination today.  We reviewed his lab work and we discussed his diabetes diagnosis that he was unaware of.  We discussed dietary changes.  We also mentioned that he is severely anemic and that his iron level was low.  I recommended starting iron sulfate 324 mg daily.  At the present time he is not taking any iron supplement.  He denies any pleurisy he does have fibrotic changes in both legs consistent with chronic lymphedema.  He does have edema in both legs to the knees but there is no evidence of a DVT or PE on his exam today.  At that time, my plan was: Discontinue tramadol and replace with Norco 5/325 1-2 p.o. every 6 hours as needed pain.  He can also use Tussionex 1 teaspoon every 12 hours as needed for cough.  Clinically his cellulitis is improving the gluteal region.  He has no signs of systemic illness or bacteremia such as fever, etc.  I will repeat a blood culture today and also check a CBC to monitor for any leukocytosis.  Complete Augmentin and doxycycline as prescribed.  Patient is seeing wound clinic for wound checks and he has an appointment tomorrow.  Repeat chest x-ray in 2 weeks and see the patient back in 2 weeks to reassess weaning off oxygen.  At the present time he is not ready to do so.  At that point I will give the patient a flu shot.  Would recommend a Covid vaccine 90 days after his  infection.  Discussed diabetes and recommended a low carbohydrate diet.  11/23/19 Patient is here today for a checkup.  His oxygen is 93% on room air.  However he states that when he is up walking around, it will drop into the mid 80s.  He does report some pleurisy with deep inspiration and he still has a resting tachycardia of 104 to 108 bpm.  He denies any hemoptysis or purulent sputum.  His cough is getting better.  Chest x-ray looks considerably better compared to the chest x-ray from September 28 although it does have some residual opacities which may reflect scar tissue.  He is due to recheck his CBC to monitor his anemia.  He is on iron tablet.  He reports that he is not sleeping.  He states that he can only get about 2 hours of sleep to 3 hours of sleep and then he is wide-awake.  There is no particular symptom that is keeping him awake is just that he is unable to rest.  He also continues to have pain around the the healing wound on his right gluteus.  I examined the wound today with his wife.  She is doing an excellent job taking care of the wound.  There is a circular patch of granulation tissue roughly  5 cm x 3 cm.  There is no ulcer or cavity.  It is filled in almost completely.  It is still tender to the touch.  There is no surrounding erythema or warmth to suggest cellulitis.  There is no palpable fluctuance.  The wound appears to be healing well.  He is still seeing wound care.  At that time, my plan was: Given his tachycardia and pleurisy and shortness of breath I will check a D-dimer to evaluate for possible pulmonary embolism given his recent Covid.  If D-dimer is positive I would recommend a CT angiogram of the chest.  Recheck CBC to monitor his anemia.  Also recheck a CMP to monitor his glucose and kidney function.  However I am pleased by his breath sounds today and the improvement on his chest x-ray.  I hope that over the next 3 to 4 weeks he will be able to wean off oxygen completely.  I  will give him Ambien 10 mg p.o. nightly as needed insomnia but I encouraged him to try to wean away from the pain medication and not to take the pain medication at the same time as the Ambien.  Also gave the patient compression hose prescription for knee-high compression hose 15 to 20 mmHg to help with the lymphedema in his legs.  As a side note, the patient reports numbness and tingling in both hands distal to the wrist with pain in his fingers.  I suspect carpal tunnel syndrome.  We discussed nerve conduction studies but we will defer that at the present time.  12/21/19 Patient has rapidly gained weight.  His weight is up over 50 pounds since I last saw him.  His legs are swollen and tight and tender to palpation.  He also reports shortness of breath with activity.  He had a positive D-dimer at his last visit so we did obtain a CT scan but no pulmonary embolism was found only residual inflammation in the lungs from his Covid pneumonia.  However I am concerned about right-sided heart failure due to this as well as due to his morbid obesity.  I believe the patient likely has diastolic heart failure even though he had a normal echocardiogram in September 29.  He denies any chest pain.  He does have some orthopnea.  He also reports depression and anxiety.  He has having trouble sleeping.  He reports panic attacks.  He states that if his wife leaves him alone he panics.  She states that if he wakes up and she is not in the home he will panic.  At that time, my plan was: Patient clearly is fluid overloaded.  I believe the patient has acute diastolic heart failure or right-sided heart failure due to his underlying lung damage.  Begin Lasix 40 mg twice daily along with K-Dur 20 milliequivalents daily.  Check BNP, CBC, CMP.  Reassess weight and renal function in 1 week and titrate diuretics to achieve the desired effect.  Meanwhile start Lexapro for GAD 10 mg a day.  12/31/19 Patient has diuresed and lost over 20  pounds of fluid.  The swelling in his legs is better although it is still very prominent.  His breathing is slightly better.  He states however if he sits without oxygen his oxygen will drop to 90% on room air.  He denies any cramping.  He denies any dizziness.  He denies any lightheadedness however he does continue to have dyspnea on exertion and easy fatigability.  He continues  to have severe pain in his ankles and in his legs with ambulation.  He is using hydrocodone 1-2 times a day.  The Lexapro was not helping with panic attacks however he is only been on the medication for about 10 days.  I explained to him that this is to be expected and that we need to be patient. Past Medical History:  Diagnosis Date  . COVID    9/21- hypoxia requiring oxygen  . Diabetes mellitus without complication (HCC)   . Gluteal abscess    ecoli after covid hospitalization 2021  . Hypertension   . Obesity   . Thoracic aortic aneurysm (HCC)    No past surgical history on file. Current Outpatient Medications on File Prior to Visit  Medication Sig Dispense Refill  . acetaminophen (TYLENOL) 325 MG tablet Take 650 mg by mouth every 6 (six) hours as needed.    Marland Kitchen albuterol (VENTOLIN HFA) 108 (90 Base) MCG/ACT inhaler Inhale 2 puffs into the lungs every 4 (four) hours as needed for wheezing or shortness of breath. 18 g 2  . ascorbic acid (VITAMIN C) 500 MG tablet Take 1 tablet (500 mg total) by mouth daily. 30 tablet 1  . benzonatate (TESSALON) 100 MG capsule Take 2 capsules (200 mg total) by mouth 3 (three) times daily as needed for cough. 30 capsule 0  . escitalopram (LEXAPRO) 10 MG tablet Take 1 tablet (10 mg total) by mouth daily. 30 tablet 1  . ferrous sulfate 324 (65 Fe) MG TBEC Take 1 tablet (325 mg total) by mouth every morning. 30 tablet 3  . furosemide (LASIX) 40 MG tablet Take 1 tablet (40 mg total) by mouth 2 (two) times daily. 60 tablet 3  . HYDROcodone-acetaminophen (NORCO) 5-325 MG tablet Take 1 tablet by  mouth every 6 (six) hours as needed for moderate pain. 30 tablet 0  . Multiple Vitamin (MULTIVITAMIN) tablet Take 1 tablet by mouth daily.    . pantoprazole (PROTONIX) 40 MG tablet Take 1 tablet (40 mg total) by mouth daily. 30 tablet 1  . polyethylene glycol (MIRALAX / GLYCOLAX) 17 g packet Take 17 g by mouth daily. 14 each 0  . potassium chloride SA (KLOR-CON) 20 MEQ tablet Take 1 tablet (20 mEq total) by mouth daily. 30 tablet 1  . senna-docusate (SENOKOT-S) 8.6-50 MG tablet Take 2 tablets by mouth at bedtime. 60 tablet 1  . zinc sulfate 220 (50 Zn) MG capsule Take 1 capsule (220 mg total) by mouth daily. 30 capsule 0  . zolpidem (AMBIEN) 10 MG tablet TAKE 1 TABLET(10 MG) BY MOUTH AT BEDTIME AS NEEDED FOR SLEEP 30 tablet 3   No current facility-administered medications on file prior to visit.   No Known Allergies Social History   Socioeconomic History  . Marital status: Single    Spouse name: Not on file  . Number of children: Not on file  . Years of education: Not on file  . Highest education level: Not on file  Occupational History  . Not on file  Tobacco Use  . Smoking status: Never Smoker  . Smokeless tobacco: Never Used  Vaping Use  . Vaping Use: Never used  Substance and Sexual Activity  . Alcohol use: Yes    Comment: occasional  . Drug use: No  . Sexual activity: Never  Other Topics Concern  . Not on file  Social History Narrative  . Not on file   Social Determinants of Health   Financial Resource Strain: Not on file  Food Insecurity: Not on file  Transportation Needs: Not on file  Physical Activity: Not on file  Stress: Not on file  Social Connections: Not on file  Intimate Partner Violence: Not on file    Review of Systems  All other systems reviewed and are negative.      Objective:   Physical Exam Vitals reviewed.  Constitutional:      Appearance: He is obese.  Cardiovascular:     Rate and Rhythm: Normal rate and regular rhythm.     Heart  sounds: Normal heart sounds.  Pulmonary:     Effort: Pulmonary effort is normal. No respiratory distress.     Breath sounds: Normal breath sounds. No wheezing, rhonchi or rales.  Musculoskeletal:        General: Deformity present.     Right lower leg: Edema present.     Left lower leg: Edema present.  Neurological:     Mental Status: He is alert.    Wt Readings from Last 3 Encounters:  12/31/19 (!) 425 lb (192.8 kg)  12/21/19 (!) 444 lb (201.4 kg)  11/09/19 (!) 383 lb 9.6 oz (174 kg)          Assessment & Plan:  Leg swelling - Plan: CBC with Differential/Platelet, COMPLETE METABOLIC PANEL WITH GFR  Acute heart failure, unspecified heart failure type (HCC) - Plan: CBC with Differential/Platelet, COMPLETE METABOLIC PANEL WITH GFR  Chronic respiratory failure with hypoxia (HCC) - Plan: CBC with Differential/Platelet, COMPLETE METABOLIC PANEL WITH GFR  I believe the patient has diastolic heart failure.  He may also have right-sided heart failure.  I believe is due to a combination of morbid obesity, scarring in the lungs due to Covid, untreated obstructive sleep apnea.  Therefore continue Lasix 40 mg twice daily.  Check a CMP to monitor electrolytes to determine if we need to increase potassium supplementation or decrease diuretic.  I would like to see the patient lose an additional 20 pounds of fluid.  Therefore I would like to recheck via telephone in 1 week assuming his electrolytes are normal.  However I believe part of his issue could be his tachycardia.  The tachycardia decreases filling time for the heart and if he does have a stiff heart consistent with diastolic dysfunction this only exacerbates his decreased cardiac output.  Therefore I will start the patient on metoprolol XL 25 mg a day in an effort to try to slow his tachycardia and improve filling time to increase cardiac output.  I will also consult cardiology for any additional recommendations to manage diastolic dysfunction.   I did refill his hydrocodone 5/325 1 tablet twice daily 60 tablets/month.  Reassess the patient via telephone in 1 week.  Try to achieve an additional 20 pounds of diuresis assuming electrolytes are normal

## 2020-01-01 LAB — CBC WITH DIFFERENTIAL/PLATELET
Absolute Monocytes: 671 cells/uL (ref 200–950)
Basophils Absolute: 39 cells/uL (ref 0–200)
Basophils Relative: 0.5 %
Eosinophils Absolute: 62 cells/uL (ref 15–500)
Eosinophils Relative: 0.8 %
HCT: 33.3 % — ABNORMAL LOW (ref 38.5–50.0)
Hemoglobin: 9.8 g/dL — ABNORMAL LOW (ref 13.2–17.1)
Lymphs Abs: 1880 cells/uL (ref 850–3900)
MCH: 20.6 pg — ABNORMAL LOW (ref 27.0–33.0)
MCHC: 29.4 g/dL — ABNORMAL LOW (ref 32.0–36.0)
MCV: 70 fL — ABNORMAL LOW (ref 80.0–100.0)
MPV: 11.3 fL (ref 7.5–12.5)
Monocytes Relative: 8.6 %
Neutro Abs: 5148 cells/uL (ref 1500–7800)
Neutrophils Relative %: 66 %
Platelets: 289 10*3/uL (ref 140–400)
RBC: 4.76 10*6/uL (ref 4.20–5.80)
RDW: 15.5 % — ABNORMAL HIGH (ref 11.0–15.0)
Total Lymphocyte: 24.1 %
WBC: 7.8 10*3/uL (ref 3.8–10.8)

## 2020-01-01 LAB — COMPLETE METABOLIC PANEL WITH GFR
AG Ratio: 1.4 (calc) (ref 1.0–2.5)
ALT: 12 U/L (ref 9–46)
AST: 12 U/L (ref 10–40)
Albumin: 4.2 g/dL (ref 3.6–5.1)
Alkaline phosphatase (APISO): 75 U/L (ref 36–130)
BUN: 11 mg/dL (ref 7–25)
CO2: 31 mmol/L (ref 20–32)
Calcium: 9.7 mg/dL (ref 8.6–10.3)
Chloride: 103 mmol/L (ref 98–110)
Creat: 0.97 mg/dL (ref 0.60–1.35)
GFR, Est African American: 110 mL/min/{1.73_m2} (ref >=60)
GFR, Est Non African American: 95 mL/min/{1.73_m2} (ref >=60)
Globulin: 3.1 g/dL (calc) (ref 1.9–3.7)
Glucose, Bld: 101 mg/dL — ABNORMAL HIGH (ref 65–99)
Potassium: 4.3 mmol/L (ref 3.5–5.3)
Sodium: 143 mmol/L (ref 135–146)
Total Bilirubin: 0.3 mg/dL (ref 0.2–1.2)
Total Protein: 7.3 g/dL (ref 6.1–8.1)

## 2020-01-03 ENCOUNTER — Telehealth: Payer: Self-pay

## 2020-01-03 NOTE — Telephone Encounter (Signed)
Patient called back for lab results, patient verbalized understanding of lab results given.

## 2020-01-27 NOTE — Progress Notes (Signed)
Cardiology Office Note   Date:  01/28/2020   ID:  Ricardo Park., DOB 03/03/1975, MRN [id number]  PCP:  [name], Ricardo Lopez  Cardiologist:   No primary care provider on file. Referring:  [name], Ricardo Lopez  Chief Complaint  Patient presents with  . Shortness of Breath  . Edema      History of Present Illness: Ricardo Derrig. is a 45 y.o. male who is referred by [name], Ricardo Lopez for evaluation of leg swelling and SOB.    He had an echo in Sept with normal LV function and mild LVH.  He was in the hospital in early sept with Covid pneumonia and then later in Sept with E. Coli bactermia and respiratory failure.  He is also morbidly obese and thought to have obesity hypoventilation syndrome.  He is on home oxygen 2 L all the time sometimes needing 3.  He does have residual groundglass opacities on his CT and I reviewed the images for this appointment.  His biggest issue is fatigue.  He is not sleeping more than 3 or 4 hours at night.  He denies any chest pressure, neck or arm discomfort.  He does snore.  He apparently has some sleep apnea at night his wife witnesses.  He has lower extremity swelling which was much worse after Covid and currently is less than it was at its peak.  He is not having any new presyncope or syncope but he has a constant tachycardia and feels his heart racing.   Past Medical History:  Diagnosis Date  . COVID    9/21- hypoxia requiring oxygen  . Diabetes mellitus without complication (HCC)   . Gluteal abscess    ecoli after covid hospitalization 2021  . Hypertension   . Obesity   . Thoracic aortic aneurysm [facility])     Past Surgical History:  Procedure Laterality Date  . None       Current Outpatient Medications  Medication Sig Dispense Refill  . acetaminophen (TYLENOL) 325 MG tablet Take 650 mg by mouth every 6 (six) hours as needed.    Marland Kitchen albuterol (VENTOLIN HFA) 108 (90 Base) MCG/ACT inhaler Inhale 2 puffs into the lungs every 4  (four) hours as needed for wheezing or shortness of breath. 18 g 2  . ascorbic acid (VITAMIN C) 500 MG tablet Take 1 tablet (500 mg total) by mouth daily. 30 tablet 1  . benzonatate (TESSALON) 100 MG capsule Take 2 capsules (200 mg total) by mouth 3 (three) times daily as needed for cough. 30 capsule 0  . escitalopram (LEXAPRO) 10 MG tablet Take 1 tablet (10 mg total) by mouth daily. 30 tablet 1  . ferrous sulfate 324 (65 Fe) MG TBEC Take 1 tablet (325 mg total) by mouth every morning. 30 tablet 3  . furosemide (LASIX) 40 MG tablet Take 1 tablet (40 mg total) by mouth 2 (two) times daily. 60 tablet 3  . HYDROcodone-acetaminophen (NORCO) 5-325 MG tablet Take 1 tablet by mouth every 6 (six) hours as needed for moderate pain. 60 tablet 0  . Multiple Vitamin (MULTIVITAMIN) tablet Take 1 tablet by mouth daily.    . pantoprazole (PROTONIX) 40 MG tablet Take 1 tablet (40 mg total) by mouth daily. 30 tablet 1  . polyethylene glycol (MIRALAX / GLYCOLAX) 17 g packet Take 17 g by mouth daily. 14 each 0  . potassium chloride SA (KLOR-CON) 20 MEQ tablet Take 1 tablet (20 mEq total) by  mouth daily. 30 tablet 1  . senna-docusate (SENOKOT-S) 8.6-50 MG tablet Take 2 tablets by mouth at bedtime. 60 tablet 1  . zinc sulfate 220 (50 Zn) MG capsule Take 1 capsule (220 mg total) by mouth daily. 30 capsule 0  . zolpidem (AMBIEN) 10 MG tablet TAKE 1 TABLET(10 MG) BY MOUTH AT BEDTIME AS NEEDED FOR SLEEP 30 tablet 3  . metoprolol succinate (TOPROL-XL) 50 MG 24 hr tablet Take 1 tablet (50 mg total) by mouth daily. 90 tablet 1   No current facility-administered medications for this visit.    Allergies:   Patient has no known allergies.    Social History:  The patient  reports that he has never smoked. He has never used smokeless tobacco. He reports current alcohol use. He reports that he does not use drugs.   Family History:  The patient's family history includes Diabetes in his father and mother.    ROS:  Please see  the history of present illness.   Otherwise, review of systems are positive for none.   All other systems are reviewed and negative.    PHYSICAL EXAM: VS:  BP 140/78 (BP Location: Left Wrist, Patient Position: Sitting, Cuff Size: Large)   Pulse (!) 112   Ht 5\' 8"  (1.727 m)   Wt (!) 458 lb (207.7 kg)   BMI 69.64 kg/m  , BMI Body mass index is 69.64 kg/m. GENERAL:  Well appearing HEENT:  Pupils equal round and reactive, fundi not visualized, oral mucosa unremarkable NECK:  No jugular venous distention, waveform within normal limits, carotid upstroke brisk and symmetric, no bruits, no thyromegaly LYMPHATICS:  No cervical, inguinal adenopathy LUNGS:  Clear to auscultation bilaterally BACK:  No CVA tenderness CHEST:  Unremarkable HEART:  PMI not displaced or sustained,S1 and S2 within normal limits, questionable S3, no S4, no clicks, no rubs, no murmurs ABD:  Flat, positive bowel sounds normal in frequency in pitch, no bruits, no rebound, no guarding, no midline pulsatile mass, no hepatomegaly, no splenomegaly EXT:  2 plus pulses throughout, moderate bilateral lower extremity to the knees edema, no cyanosis no clubbing SKIN:  No rashes no nodules NEURO:  Cranial nerves II through XII grossly intact, motor grossly intact throughout PSYCH:  Cognitively intact, oriented to person place and time    EKG:  EKG is ordered today. The ekg ordered today demonstrates sinus tachycardia, rate 112, axis within normal limits, intervals within normal limits, no acute ST-T wave changes.   Recent Labs: 11/02/2019: Magnesium 2.0 12/21/2019: Brain Natriuretic Peptide <4 12/31/2019: ALT 12; BUN 11; Creat 0.97; Hemoglobin 9.8; Platelets 289; Potassium 4.3; Sodium 143    Lipid Panel    Component Value Date/Time   TRIG 59 10/19/2019 1713      Wt Readings from Last 3 Encounters:  01/28/20 (!) 458 lb (207.7 kg)  12/31/19 (!) 425 lb (192.8 kg)  12/21/19 (!) 444 lb (201.4 kg)     Other studies  Reviewed: Additional studies/ records that were reviewed today include: Extensive review of hospital records, CT images. Review of the above records demonstrates:  Please see elsewhere in the note.     ASSESSMENT AND PLAN:  HTN: Blood pressure is controlled.  No change in therapy.  MORBID OBESITY: This is certainly contributing to the entire picture and we talked about slow gradual weight loss.  DM: A1c is 7.1.  Per Dr. Dennard Schaumann, Cammie Mcgee, Ricardo Lopez  SLEEP APNEA:   He does not yet have a CPAP and has not completed  a work-up from the past.  I will offer to help facilitate this.  TACHYCARDIA: I do not see a TSH.  I will check this.  I do note that he has some mild anemia which is contributing.  His hypoxemia certainly contributing.  He is going to wear a 3-day monitor.  I will also check other labs as below.  I am going to increase his metoprolol to 50 mg a day.  HYPOXEMIA: I did review his echo previously and he had normal systolic function.  He probably does have some diastolic dysfunction.  He has some RV dysfunction with probably hypoventilation obesity syndrome and sleep apnea.  I am going to check a BNP level.  His last echo was just in September but I will have a low threshold to repeat this.   Current medicines are reviewed at length with the patient today.  The patient does not have concerns regarding medicines.  The following changes have been made:  As abov  Labs/ tests ordered today include:   Orders Placed This Encounter  Procedures  . TSH  . B Nat Peptide  . LONG TERM MONITOR (3-14 DAYS)  . EKG 12-Lead     Disposition:   FU with me in one month.     Signed, Ricardo Rotunda, Ricardo Lopez  01/28/2020 9:28 AM    [facility]

## 2020-01-28 ENCOUNTER — Other Ambulatory Visit: Payer: Self-pay

## 2020-01-28 ENCOUNTER — Encounter: Payer: Self-pay | Admitting: Cardiology

## 2020-01-28 ENCOUNTER — Ambulatory Visit (INDEPENDENT_AMBULATORY_CARE_PROVIDER_SITE_OTHER): Payer: 59

## 2020-01-28 ENCOUNTER — Other Ambulatory Visit: Payer: Self-pay | Admitting: *Deleted

## 2020-01-28 ENCOUNTER — Ambulatory Visit (INDEPENDENT_AMBULATORY_CARE_PROVIDER_SITE_OTHER): Payer: 59 | Admitting: Cardiology

## 2020-01-28 ENCOUNTER — Telehealth: Payer: Self-pay | Admitting: *Deleted

## 2020-01-28 VITALS — BP 140/78 | HR 112 | Ht 68.0 in | Wt >= 6400 oz

## 2020-01-28 DIAGNOSIS — I10 Essential (primary) hypertension: Secondary | ICD-10-CM | POA: Diagnosis not present

## 2020-01-28 DIAGNOSIS — R000 Tachycardia, unspecified: Secondary | ICD-10-CM

## 2020-01-28 DIAGNOSIS — E662 Morbid (severe) obesity with alveolar hypoventilation: Secondary | ICD-10-CM

## 2020-01-28 LAB — BRAIN NATRIURETIC PEPTIDE: BNP: <2.5 pg/mL (ref 0.0–100.0)

## 2020-01-28 LAB — TSH: TSH: 5.27 u[IU]/mL — ABNORMAL HIGH (ref 0.450–4.500)

## 2020-01-28 MED ORDER — HYDROCODONE-ACETAMINOPHEN 5-325 MG PO TABS
1.0000 | ORAL_TABLET | Freq: Four times a day (QID) | ORAL | 0 refills | Status: DC | PRN
Start: 2020-01-28 — End: 2020-02-22

## 2020-01-28 MED ORDER — METOPROLOL SUCCINATE ER 50 MG PO TB24: 50.0000 mg | ORAL_TABLET | Freq: Every day | ORAL | 1 refills | Status: DC

## 2020-01-28 NOTE — Telephone Encounter (Signed)
Received VM from patient.   Requested refill on Hydrocodone/APAP.   Ok to refill??  Last office visit/ refill 12/31/2019.

## 2020-01-28 NOTE — Patient Instructions (Addendum)
Medication Instructions:  INCREASE YOUR METOPROLOL SUCC TO 50 MG DAILY   TAKE YOUR FUROSEMIDE 40 MG IN THE MORNING AND AGAIN ABOUT 6 HOURS LATER INSTEAD OF AT NIGHT   *If you need a refill on your cardiac medications before your next appointment, please call your pharmacy*  Lab Work: BNP/TSH TODAY   If you have labs (blood work) drawn today and your tests are completely normal, you will receive your results only by: Marland Kitchen MyChart Message (if you have MyChart) OR . A paper copy in the mail If you have any lab test that is abnormal or we need to change your treatment, we will call you to review the results.  Testing/Procedures: Your physician has recommended that you wear a holter monitor. Holter monitors are medical devices that record the heart's electrical activity. Doctors most often use these monitors to diagnose arrhythmias. Arrhythmias are problems with the speed or rhythm of the heartbeat. The monitor is a small, portable device. You can wear one while you do your normal daily activities. This is usually used to diagnose what is causing palpitations/syncope (passing out). THIS WILL BE MAILED TO YOU   Follow-Up: At [facility], you and your health needs are our priority.  As part of our continuing mission to provide you with exceptional heart care, we have created designated Provider Care Teams.  These Care Teams include your primary Cardiologist (physician) and Advanced Practice Providers (APPs -  Physician Assistants and Nurse Practitioners) who all work together to provide you with the care you need, when you need it.  We recommend signing up for the patient portal called "MyChart".  Sign up information is provided on this After Visit Summary.  MyChart is used to connect with patients for Virtual Visits (Telemedicine).  Patients are able to view lab/test results, encounter notes, upcoming appointments, etc.  Non-urgent messages can be sent to your provider as well.   To learn more about  what you can do with MyChart, go to [email or web address].    Your next appointment:   4 week(s)  The format for your next appointment:   In Person  Provider:   You may see DR [facility]  or one of the following Advanced Practice Providers on your designated Care Team:    Theodore Demark, PA-C  Joni Reining, DNP, ANP  Other Instructions  ZIO XT- Long Term Monitor Instructions   Your physician has requested you wear your ZIO patch monitor__3__days.   This is a single patch monitor.  [employer] supplies one patch monitor per enrollment.  Additional stickers are not available.   Please do not apply patch if you will be having a Nuclear Stress Test, Echocardiogram, Cardiac CT, MRI, or Chest Xray during the time frame you would be wearing the monitor. The patch cannot be worn during these tests.  You cannot remove and re-apply the ZIO XT patch monitor.   Your ZIO patch monitor will be sent USPS Priority mail from [facility] directly to your home address. The monitor may also be mailed to a PO BOX if home delivery is not available.   It may take 3-5 days to receive your monitor after you have been enrolled.   Once you have received you monitor, please review enclosed instructions.  Your monitor has already been registered assigning a specific monitor serial # to you.   Applying the monitor   Shave hair from upper left chest.   Hold abrader disc by orange tab.  Rub abrader in 40 strokes over  left upper chest as indicated in your monitor instructions.   Clean area with 4 enclosed alcohol pads .  Use all pads to assure are is cleaned thoroughly.  Let dry.   Apply patch as indicated in monitor instructions.  Patch will be place under collarbone on left side of chest with arrow pointing upward.   Rub patch adhesive wings for 2 minutes.Remove white label marked "1".  Remove white label marked "2".  Rub patch adhesive wings for 2 additional minutes.   While looking in a mirror,  press and release button in center of patch.  A small green light will flash 3-4 times .  This will be your only indicator the monitor has been turned on.     Do not shower for the first 24 hours.  You may shower after the first 24 hours.   Press button if you feel a symptom. You will hear a small click.  Record Date, Time and Symptom in the Patient Log Book.   When you are ready to remove patch, follow instructions on last 2 pages of Patient Log Book.  Stick patch monitor onto last page of Patient Log Book.   Place Patient Log Book in [place] box.  Use locking tab on box and tape box closed securely.  The [employer] and [employer] has [employer] on it.  Please place in mailbox as soon as possible.  Your physician should have your test results approximately 7 days after the monitor has been mailed back to [facility].   Call [facility] Customer Care at [phone] if you have questions regarding your ZIO XT patch monitor.  Call them immediately if you see an orange light blinking on your monitor.   If your monitor falls off in less than 4 days contact our Monitor department at [phone].  If your monitor becomes loose or falls off after 4 days call [employer] at [phone] for suggestions on securing your monitor.

## 2020-01-28 NOTE — Telephone Encounter (Signed)
Received VM from patient.   Reports that current weight is 439lbs.

## 2020-01-31 NOTE — Telephone Encounter (Signed)
Call placed to patient. No answer. No VM.  

## 2020-01-31 NOTE — Telephone Encounter (Signed)
I would continue current dose of lasix.

## 2020-02-01 NOTE — Telephone Encounter (Signed)
Call placed to patient. No answer. No VM.  

## 2020-02-02 NOTE — Telephone Encounter (Signed)
Multiple calls placed to patient with no answer and no return call.   Message to be closed.  

## 2020-02-04 ENCOUNTER — Other Ambulatory Visit: Payer: Self-pay | Admitting: *Deleted

## 2020-02-04 DIAGNOSIS — G4730 Sleep apnea, unspecified: Secondary | ICD-10-CM

## 2020-02-05 DIAGNOSIS — R000 Tachycardia, unspecified: Secondary | ICD-10-CM | POA: Diagnosis not present

## 2020-02-05 DIAGNOSIS — I10 Essential (primary) hypertension: Secondary | ICD-10-CM

## 2020-02-08 ENCOUNTER — Other Ambulatory Visit: Payer: Self-pay | Admitting: Cardiology

## 2020-02-08 DIAGNOSIS — R000 Tachycardia, unspecified: Secondary | ICD-10-CM

## 2020-02-08 DIAGNOSIS — R0902 Hypoxemia: Secondary | ICD-10-CM

## 2020-02-08 DIAGNOSIS — I10 Essential (primary) hypertension: Secondary | ICD-10-CM

## 2020-02-14 NOTE — Progress Notes (Signed)
Cardiology Clinic Note   Patient Name: Ricardo Lopez. Date of Encounter: 02/21/2020  Primary Care Provider:  [name], MD Primary Cardiologist:  Rollene Rotunda, MD  Patient Profile    Ricardo Lopez 45 year old male presents the clinic today for follow-up evaluation of shortness of breath and to review his cardiac event monitor.  Past Medical History    Past Medical History:  Diagnosis Date  . COVID    9/21- hypoxia requiring oxygen  . Diabetes mellitus without complication (HCC)   . Gluteal abscess    ecoli after covid hospitalization 2021  . Hypertension   . Obesity   . Thoracic aortic aneurysm [facility])    Past Surgical History:  Procedure Laterality Date  . None      Allergies  No Known Allergies  History of Present Illness    Ricardo Lopez is a PMH of lower extremity edema, shortness of breath, LVH, COVID-19 infection, morbid obesity, hypoventilation syndrome, and respiratory failure.  He was last seen by Dr. Antoine Poche on 01/28/2020.  During that time he continued on 2 L nasal cannula.  On occasion he reported needing 3.  His chest CT showed residual groundglass opacities.  He reported that his chief complaint was increased fatigue.  He was not able to sleep more than 3-4 hours per night.  He denied chest pain, arm and neck discomfort.  He reported that he did snore and felt he did have some sleep apnea that was witnessed by his wife.  His lower extremity swelling was much worse after Covid.  He reported that his lower extremity swelling had however improved somewhat.  He denied presyncope and syncope.  He reported chronic elevated heart rate and also feeling his heart race.  His EKG showed sinus tachycardia 112 bpm with no ST or T wave deviation.  A cardiac event monitor was ordered and showed an average heart rate in the 90s with rare atrial ectopy.  No other  arrhythmias were noted.    He presents the clinic today for follow-up evaluation states he continues  with 2 L of oxygen nasal cannula.  He occasionally needs 3 L of oxygen when increasing his physical activity.  We reviewed his cardiac event monitor and echocardiogram.  He reports that his blood pressure at home is similar to his readings today in the 140s over 70s-80s.  I will increase his metoprolol, give him salty 6 diet sheet, have him increase physical activity as tolerated and refer him to [facility] weight management program.  We will have him follow-up in 2 months.  Today he denies chest pain, increased shortness of breath, lower extremity edema, fatigue, palpitations, melena, hematuria, hemoptysis, diaphoresis, weakness, presyncope, syncope, orthopnea, and PND.  Home Medications    Prior to Admission medications   Medication Sig Start Date End Date Taking? Authorizing Provider  acetaminophen (TYLENOL) 325 MG tablet Take 650 mg by mouth every 6 (six) hours as needed.    [provider]  albuterol (VENTOLIN HFA) 108 (90 Base) MCG/ACT inhaler Inhale 2 puffs into the lungs every 4 (four) hours as needed for wheezing or shortness of breath. 10/22/19   Shon Hale, MD  ascorbic acid (VITAMIN C) 500 MG tablet Take 1 tablet (500 mg total) by mouth daily. 10/23/19   Shon Hale, MD  benzonatate (TESSALON) 100 MG capsule Take 2 capsules (200 mg total) by mouth 3 (three) times daily as needed for cough. 11/25/19   [name], MD  escitalopram Judye Bos)  10 MG tablet Take 1 tablet (10 mg total) by mouth daily. 12/21/19   [name], MD  ferrous sulfate 324 (65 Fe) MG TBEC Take 1 tablet (325 mg total) by mouth every morning. 11/09/19   [name], MD  furosemide (LASIX) 40 MG tablet Take 1 tablet (40 mg total) by mouth 2 (two) times daily. 12/21/19   [name], MD  HYDROcodone-acetaminophen (NORCO) 5-325 MG tablet Take 1 tablet by mouth every 6 (six) hours as needed for moderate pain. 01/28/20   [name], MD  metoprolol succinate (TOPROL-XL) 50 MG 24  hr tablet Take 1 tablet (50 mg total) by mouth daily. 01/28/20   Rollene Rotunda, MD  Multiple Vitamin (MULTIVITAMIN) tablet Take 1 tablet by mouth daily.    [provider]  pantoprazole (PROTONIX) 40 MG tablet Take 1 tablet (40 mg total) by mouth daily. 10/22/19   Shon Hale, MD  polyethylene glycol (MIRALAX / GLYCOLAX) 17 g packet Take 17 g by mouth daily. 10/23/19   Shon Hale, MD  potassium chloride SA (KLOR-CON) 20 MEQ tablet Take 1 tablet (20 mEq total) by mouth daily. 12/21/19   [name], MD  senna-docusate (SENOKOT-S) 8.6-50 MG tablet Take 2 tablets by mouth at bedtime. 10/22/19   Shon Hale, MD  zinc sulfate 220 (50 Zn) MG capsule Take 1 capsule (220 mg total) by mouth daily. 10/23/19   Shon Hale, MD  zolpidem (AMBIEN) 10 MG tablet TAKE 1 TABLET(10 MG) BY MOUTH AT BEDTIME AS NEEDED FOR SLEEP 12/28/19   [name], MD    Family History    Family History  Problem Relation Age of Onset  . Diabetes Mother   . Diabetes Father    He indicated that his mother is alive. He indicated that his father is alive.  Social History    Social History   Socioeconomic History  . Marital status: Single    Spouse name: Not on file  . Number of children: Not on file  . Years of education: Not on file  . Highest education level: Not on file  Occupational History  . Not on file  Tobacco Use  . Smoking status: Never Smoker  . Smokeless tobacco: Never Used  Vaping Use  . Vaping Use: Never used  Substance and Sexual Activity  . Alcohol use: Yes    Comment: occasional  . Drug use: No  . Sexual activity: Never  Other Topics Concern  . Not on file  Social History Narrative   Lives at home with wife and two children.  Four children in all.     Social Determinants of Health   Financial Resource Strain: Not on file  Food Insecurity: Not on file  Transportation Needs: Not on file  Physical Activity: Not on file  Stress: Not on file  Social  Connections: Not on file  Intimate Partner Violence: Not on file     Review of Systems    General:  No chills, fever, night sweats or weight changes.  Cardiovascular:  No chest pain, dyspnea on exertion, edema, orthopnea, palpitations, paroxysmal nocturnal dyspnea. Dermatological: No rash, lesions/masses Respiratory: No cough, dyspnea Urologic: No hematuria, dysuria Abdominal:   No nausea, vomiting, diarrhea, bright red blood per rectum, melena, or hematemesis Neurologic:  No visual changes, wkns, changes in mental status. All other systems reviewed and are otherwise negative except as noted above.  Physical Exam    VS:  BP (!) 142/84   Pulse 98  Ht 5\' 8"  (1.727 m)   Wt (!) 470 lb (213.2 kg)   SpO2 97%   BMI 71.46 kg/m  , BMI Body mass index is 71.46 kg/m. GEN: Well nourished, well developed, in no acute distress. HEENT: normal. Neck: Supple, no JVD, carotid bruits, or masses. Cardiac: RRR, no murmurs, rubs, or gallops. No clubbing, cyanosis, edema.  Radials/DP/PT 2+ and equal bilaterally.  Respiratory:  Respirations regular and unlabored, clear to auscultation bilaterally. GI: Soft, nontender, nondistended, BS + x 4. MS: no deformity or atrophy. Skin: warm and dry, no rash. Neuro:  Strength and sensation are intact. Psych: Normal affect.  Accessory Clinical Findings    Recent Labs: 11/02/2019: Magnesium 2.0 12/31/2019: ALT 12; BUN 11; Creat 0.97; Hemoglobin 9.8; Platelets 289; Potassium 4.3; Sodium 143 01/28/2020: BNP <2.5; TSH 5.270   Recent Lipid Panel    Component Value Date/Time   TRIG 59 10/19/2019 1713    ECG personally reviewed by me today-none today.  Echocardiogram 10/20/2019 IMPRESSIONS    1. Left ventricular ejection fraction, by estimation, is 55 to 60%. The  left ventricle has normal function. The left ventricle has no regional  wall motion abnormalities. There is mild left ventricular hypertrophy.  Left ventricular diastolic parameters  are  indeterminate.  2. Right ventricular systolic function is mildly reduced. The right  ventricular size is normal. Tricuspid regurgitation signal is inadequate  for assessing PA pressure.  3. Left atrial size was moderately dilated.  4. The mitral valve is grossly normal. Trivial mitral valve  regurgitation.  5. The aortic valve is tricuspid. Aortic valve regurgitation is not  visualized.  6. The inferior vena cava is dilated in size with <50% respiratory  variability, suggesting right atrial pressure of 15 mmHg.  Cardiac event monitor 02/16/2008  Patient had a min HR of 72 bpm, max HR of 152 bpm, and avg HR of 93 bpm. Predominant underlying rhythm was Sinus Rhythm. Isolated SVEs were rare (<1.0%), SVE Couplets were rare (<1.0%), and no SVE Triplets were present. Isolated VEs were rare (<1.0%), and no VE Couplets or VE Triplets were present.  Patch Wear Time: 2 days and 2 hours (2022-01-15T10:25:45-0500 to 2022-01-17T13:03:43-0500) Patient had a min HR of 72 bpm, max HR of 152 bpm, and avg HR of 93 bpm. Predominant underlying rhythm was Sinus Rhythm. Isolated SVEs were rare (<1.0%), SVE Couplets were rare (<1.0%), and no SVE Triplets were present. Isolated VEs were rare (<1.0%), and no VE Couplets or VE Triplets were present.   The rhythm was sinus There were no sustained arrhythmias.  Rare atrial ectopy No change in therapy.   Assessment & Plan   1.  Tachycardia-heart rate today 98 bpm.  Some improvement with increase metoprolol.  Following with PCP for elevated TSH.  Cardiac event monitor showed no significant arrhythmia with an average heart rate in the 90s.  No change in therapy recommended. Increase metoprolol to 100 mg daily Heart healthy low-sodium diet-salty 6 given Increase physical activity as tolerated Avoid triggers caffeine, chocolate, EtOH, dehydration etc.  Essential hypertension-BP today 148/73.  Well-controlled at home Continue metoprolol Heart healthy low-sodium  diet-salty 6 given Increase physical activity as tolerated  Obstructive sleep apnea-continues with daytime somnolence.  Continues to sleep 3-4 hours per night. Refer for sleep study Continue weight loss Referral to [facility] weight loss program  Morbid obesity-weight today 470 pounds. Continue weight loss.   Heart healthy low-sodium diet Increase physical activity as tolerated Referral to [facility] weight loss program  Disposition: Follow-up with  Dr. Antoine PocheHochrein or me in 3 months.  Thomasene RippleJesse M. Rondrick Barreira NP-C    02/21/2020, 2:36 PM [facility] [address] Office [phone] Fax [phone]  Notice: This dictation was prepared with Dragon dictation along with smaller phrase technology. Any transcriptional errors that result from this process are unintentional and may not be corrected upon review.  I spent 15 minutes examining this patient, reviewing medications, and using patient centered shared decision making involving her cardiac care.  Prior to her visit I spent greater than 20 minutes reviewing her past medical history,  medications, and prior cardiac tests.

## 2020-02-17 ENCOUNTER — Other Ambulatory Visit: Payer: Self-pay | Admitting: Family Medicine

## 2020-02-17 ENCOUNTER — Telehealth: Payer: Self-pay | Admitting: Cardiology

## 2020-02-17 NOTE — Telephone Encounter (Signed)
APPROVED..[id number]. VALID THROUGH 02/22/20 - 04/21/20

## 2020-02-21 ENCOUNTER — Ambulatory Visit (INDEPENDENT_AMBULATORY_CARE_PROVIDER_SITE_OTHER): Payer: 59 | Admitting: General Practice

## 2020-02-21 ENCOUNTER — Other Ambulatory Visit: Payer: Self-pay

## 2020-02-21 ENCOUNTER — Encounter: Payer: Self-pay | Admitting: General Practice

## 2020-02-21 VITALS — BP 142/84 | HR 98 | Ht 68.0 in | Wt >= 6400 oz

## 2020-02-21 DIAGNOSIS — R000 Tachycardia, unspecified: Secondary | ICD-10-CM | POA: Diagnosis not present

## 2020-02-21 DIAGNOSIS — I10 Essential (primary) hypertension: Secondary | ICD-10-CM | POA: Diagnosis not present

## 2020-02-21 DIAGNOSIS — G4730 Sleep apnea, unspecified: Secondary | ICD-10-CM | POA: Diagnosis not present

## 2020-02-21 MED ORDER — METOPROLOL SUCCINATE ER 100 MG PO TB24: 100.0000 mg | ORAL_TABLET | Freq: Every day | ORAL | 6 refills | Status: DC

## 2020-02-21 NOTE — Patient Instructions (Addendum)
Medication Instructions:  INCREASE METOPROLOL 100MG  DAILY *If you need a refill on your cardiac medications before your next appointment, please call your pharmacy*  Lab Work:   Testing/Procedures:  NONE    NONE  Special Instructions CONCENTRATE ON WEIGHT LOSS  REFERRAL TO MEDICAL WEIGHT LOSS CLINIC  PLEASE READ AND FOLLOW SALTY 6-ATTACHED-1,800mg  daily  PLEASE INCREASE PHYSICAL ACTIVITY AS TOLERATED  Follow-Up: Your next appointment:  2 month(s) In Person with , MD OR IF UNAVAILABLE JESSE CLEAVER, FNP-C  At [facility], you and your health needs are our priority.  As part of our continuing mission to provide you with exceptional heart care, we have created designated Provider Care Teams.  These Care Teams include your primary Cardiologist (physician) and Advanced Practice Providers (APPs -  Physician Assistants and Nurse Practitioners) who all work together to provide you with the care you need, when you need it.            6 SALTY THINGS TO AVOID     1,800MG  DAILY

## 2020-02-22 ENCOUNTER — Other Ambulatory Visit: Payer: Self-pay

## 2020-02-22 MED ORDER — HYDROCODONE-ACETAMINOPHEN 5-325 MG PO TABS
1.0000 | ORAL_TABLET | Freq: Four times a day (QID) | ORAL | 0 refills | Status: DC | PRN
Start: 2020-02-22 — End: 2020-03-20

## 2020-02-24 ENCOUNTER — Ambulatory Visit: Payer: 59 | Attending: Cardiology | Admitting: Cardiovascular Disease

## 2020-02-24 ENCOUNTER — Other Ambulatory Visit: Payer: Self-pay

## 2020-02-24 DIAGNOSIS — Z6841 Body Mass Index (BMI) 40.0 and over, adult: Secondary | ICD-10-CM | POA: Diagnosis not present

## 2020-02-24 DIAGNOSIS — R0902 Hypoxemia: Secondary | ICD-10-CM

## 2020-02-24 DIAGNOSIS — Z79899 Other long term (current) drug therapy: Secondary | ICD-10-CM | POA: Insufficient documentation

## 2020-02-24 DIAGNOSIS — R000 Tachycardia, unspecified: Secondary | ICD-10-CM

## 2020-02-24 DIAGNOSIS — G4733 Obstructive sleep apnea (adult) (pediatric): Secondary | ICD-10-CM | POA: Insufficient documentation

## 2020-02-24 DIAGNOSIS — I10 Essential (primary) hypertension: Secondary | ICD-10-CM

## 2020-03-11 ENCOUNTER — Encounter: Payer: Self-pay | Admitting: Cardiovascular Disease

## 2020-03-11 NOTE — Procedures (Signed)
[facility]        Patient Name: Ricardo Lopez, Ricardo Lopez Date: 02/24/2020 Gender: Male D.O.B: 1975-10-23 Age (years): 71 Referring Provider: Broadus John T. Pickard Height (inches): 68 Interpreting Physician: Nicki Guadalajara MD, ABSM Weight (lbs): 470 RPSGT: Peak, Ryin BMI: 71 MRN: [id number] Neck Size: 20.00  CLINICAL INFORMATION Sleep Study Type: NPSG  Indication for sleep study: Fatigue, snoring, hypoventilation-obesity  Epworth Sleepiness Score: 12  SLEEP STUDY TECHNIQUE As per the AASM Manual for the Scoring of Sleep and Associated Events v2.3 (April 2016) with a hypopnea requiring 4% desaturations.  The channels recorded and monitored were frontal, central and occipital EEG, electrooculogram (EOG), submentalis EMG (chin), nasal and oral airflow, thoracic and abdominal wall motion, anterior tibialis EMG, snore microphone, electrocardiogram, and pulse oximetry.  MEDICATIONS acetaminophen (TYLENOL) 325 MG tablet albuterol (VENTOLIN HFA) 108 (90 Base) MCG/ACT inhaler ascorbic acid (VITAMIN C) 500 MG tablet benzonatate (TESSALON) 100 MG capsule escitalopram (LEXAPRO) 10 MG tablet ferrous sulfate 324 (65 Fe) MG TBEC furosemide (LASIX) 40 MG tablet HYDROcodone-acetaminophen (NORCO) 5-325 MG tablet metoprolol succinate (TOPROL-XL) 100 MG 24 hr tablet Multiple Vitamin (MULTIVITAMIN) tablet pantoprazole (PROTONIX) 40 MG tablet polyethylene glycol (MIRALAX / GLYCOLAX) 17 g packet potassium chloride SA (KLOR-CON) 20 MEQ tablet senna-docusate (SENOKOT-S) 8.6-50 MG tablet zinc sulfate 220 (50 Zn) MG capsule zolpidem (AMBIEN) 10 MG tablet Medications self-administered by patient taken the night of the study : N/A  SLEEP ARCHITECTURE The study was initiated at 10:32:22 PM and ended at 5:00:18 AM.  Sleep onset time was 121.2 minutes and the sleep efficiency was 5.8%. The total sleep time was 22.5 minutes.  Stage REM latency was N/A minutes.  The patient spent  75.56% of the night in stage N1 sleep, 24.44% in stage N2 sleep, 0.00% in stage N3 and 0% in REM.  Alpha intrusion was absent.  Supine sleep was 100.00%.  RESPIRATORY PARAMETERS The overall apnea/hypopnea index (AHI) was 21.3 per hour. There were 7 total apneas, including 7 obstructive, 0 central and 0 mixed apneas. There were 1 hypopneas and 0 RERAs.  The AHI during Stage REM sleep was N/A per hour.  AHI while supine was 21.3 per hour.  The mean oxygen saturation was 95.77%. The minimum SpO2 during sleep was 85.00%.  Snoring was noted during this study.  CARDIAC DATA The 2 lead EKG demonstrated sinus rhythm. The mean heart rate was 92.55 beats per minute. Other EKG findings include: None.  LEG MOVEMENT DATA The total PLMS were 0 with a resulting PLMS index of 0.00. Associated arousal with leg movement index was 0.0 .  IMPRESSIONS - Moderate obstructive sleep apnea occurred during this study (AHI 21.3/h); however, total sleep time was only 22 minutes and absent REM sleep.   - No significant central sleep apnea occurred during this study (CAI = 0.0/h). - Mild oxygen desaturation to a nadir of 85%. - Very poor sleep efficiency at only 5.8%. WASO 244 minutes.   - Mild snoring was audible during the very brief sleep. - No cardiac abnormalities were noted during this study. - Clinically significant periodic limb movements did not occur during sleep. No significant associated arousals.  DIAGNOSIS - Obstructive Sleep Apnea (G47.33)  RECOMMENDATIONS - Therapeutic CPAP titration to determine optimal pressure required to alleviate sleep disordered breathing. - Effort should be made to optimize nasal and oropharyngeal patency.  - Recommend a sleep aid for the titration study. - Positional  therapy avoiding supine position during sleep. - Avoid alcohol, sedatives and other CNS depressants that may worsen sleep apnea and disrupt normal sleep architecture. - Sleep hygiene should be reviewed  to assess factors that may improve sleep quality. - Weight management (BMI 71) and regular exercise should be initiated or continued if appropriate.  [Electronically signed] 03/11/2020 04:30 PM  Nicki Guadalajara MD, [facility], ABSM Diplomate, American Board of Sleep Medicine   NPI: [id number] [facility] PH: [phone]   FX: [phone] ACCREDITED BY THE AMERICAN ACADEMY OF SLEEP MEDICINE

## 2020-03-14 ENCOUNTER — Other Ambulatory Visit: Payer: Self-pay | Admitting: Cardiovascular Disease

## 2020-03-14 ENCOUNTER — Telehealth: Payer: Self-pay | Admitting: *Deleted

## 2020-03-14 DIAGNOSIS — G4733 Obstructive sleep apnea (adult) (pediatric): Secondary | ICD-10-CM

## 2020-03-14 NOTE — Telephone Encounter (Signed)
Patient notified of sleep study results and recommendations. He agrees to proceed with titration study. When asked if he has any questions, he did not. PA for titration study submitted to [facility].

## 2020-03-16 ENCOUNTER — Telehealth: Payer: Self-pay | Admitting: *Deleted

## 2020-03-16 NOTE — Telephone Encounter (Signed)
Patient notified of CPAP titration appointment details. He was instructed per Dr Tresa Endo to take his Ambien with him to the appointment so he can take it for the study.

## 2020-03-18 ENCOUNTER — Other Ambulatory Visit: Payer: Self-pay | Admitting: Family Medicine

## 2020-03-20 ENCOUNTER — Other Ambulatory Visit: Payer: Self-pay | Admitting: Family Medicine

## 2020-03-20 NOTE — Telephone Encounter (Signed)
Ok to refill??  Last office visit 12/31/2019.  Last refill 02/22/2020.

## 2020-03-21 MED ORDER — HYDROCODONE-ACETAMINOPHEN 5-325 MG PO TABS: 1.0000 | ORAL_TABLET | Freq: Four times a day (QID) | ORAL | 0 refills | Status: DC | PRN

## 2020-03-30 ENCOUNTER — Other Ambulatory Visit: Payer: Self-pay

## 2020-03-30 ENCOUNTER — Ambulatory Visit: Payer: 59 | Attending: Cardiovascular Disease | Admitting: Cardiovascular Disease

## 2020-03-30 DIAGNOSIS — G4733 Obstructive sleep apnea (adult) (pediatric): Secondary | ICD-10-CM | POA: Insufficient documentation

## 2020-03-30 DIAGNOSIS — Z79899 Other long term (current) drug therapy: Secondary | ICD-10-CM | POA: Diagnosis not present

## 2020-03-30 DIAGNOSIS — G4736 Sleep related hypoventilation in conditions classified elsewhere: Secondary | ICD-10-CM | POA: Insufficient documentation

## 2020-03-30 DIAGNOSIS — G4734 Idiopathic sleep related nonobstructive alveolar hypoventilation: Secondary | ICD-10-CM

## 2020-04-03 ENCOUNTER — Telehealth: Payer: Self-pay | Admitting: Family Medicine

## 2020-04-03 NOTE — Telephone Encounter (Signed)
Patient and his was were calling to inquire about a solution that goes with his oxygen concentrator. The wife tried to reach out to the hospital and was told that she would need to ask the PCP about getting a bottle of the solution sent to [facility]. Please advise.

## 2020-04-04 NOTE — Telephone Encounter (Signed)
Are they referring to a humidifier for his oxygen concentrator?  I am fine with an order being sent to his DME for a humidifier.

## 2020-04-04 NOTE — Telephone Encounter (Signed)
Call placed to patient to clarify.

## 2020-04-05 NOTE — Telephone Encounter (Signed)
Received return call from patient.   Reports  that he requires Humidifier bottle and supplies.   Order placed via [employer] Website: Humidifier Bottle, Standard  Humidifier Bottle Adapter  Water Trap for Oxygen Tubing

## 2020-04-17 ENCOUNTER — Encounter: Payer: Self-pay | Admitting: Cardiovascular Disease

## 2020-04-17 ENCOUNTER — Other Ambulatory Visit: Payer: Self-pay | Admitting: Family Medicine

## 2020-04-17 DIAGNOSIS — R0902 Hypoxemia: Secondary | ICD-10-CM | POA: Insufficient documentation

## 2020-04-17 DIAGNOSIS — R000 Tachycardia, unspecified: Secondary | ICD-10-CM | POA: Insufficient documentation

## 2020-04-17 DIAGNOSIS — G4730 Sleep apnea, unspecified: Secondary | ICD-10-CM | POA: Insufficient documentation

## 2020-04-17 MED ORDER — HYDROCODONE-ACETAMINOPHEN 5-325 MG PO TABS
1.0000 | ORAL_TABLET | Freq: Four times a day (QID) | ORAL | 0 refills | Status: DC | PRN
Start: 2020-04-17 — End: 2020-05-16

## 2020-04-17 NOTE — Telephone Encounter (Signed)
Ok to refill??  Last office visit 12/31/2019.  Last refill 03/21/2020.

## 2020-04-17 NOTE — Progress Notes (Signed)
Cardiology Office Note   Date:  04/18/2020   ID:  [name], DOB Dec 11, 1975, MRN [id number]  PCP:  Ricardo Brooks, MD  Cardiologist:   Rollene Rotunda, MD Referring:  Ricardo Brooks, MD  Chief Complaint  Patient presents with  . Edema      History of Present Illness: Ricardo Lopez. is a 45 y.o. male who is referred by Ricardo Brooks, MD for evaluation of leg swelling and SOB.    He had an echo in Sept with normal LV function and mild LVH.  He was in the hospital in early sept with Covid pneumonia and then later in Sept with E. Coli bactermia and respiratory failure.  He is also morbidly obese and thought to have obesity hypoventilation syndrome.  He is on home oxygen 2 L all the time sometimes needing 3.  He does have residual groundglass opacities on his CT and I reviewed the images for this appointment.  Since I last saw him he has crossed 500 lb threshold.    He is depressed .  He continues to have increased leg swelling.  His predominant complaint is fatigue.  He had his sleep study and BiPAP has been ordered.  He denies any new chest pressure, neck or arm discomfort.  He has no new palpitations, presyncope or syncope.  He is chronically on O2.   Past Medical History:  Diagnosis Date  . COVID    9/21- hypoxia requiring oxygen  . Diabetes mellitus without complication (HCC)   . Gluteal abscess    ecoli after covid hospitalization 2021  . Hypertension   . Obesity   . Thoracic aortic aneurysm [facility])     Past Surgical History:  Procedure Laterality Date  . None       Current Outpatient Medications  Medication Sig Dispense Refill  . acetaminophen (TYLENOL) 325 MG tablet Take 650 mg by mouth every 6 (six) hours as needed.    Marland Kitchen albuterol (VENTOLIN HFA) 108 (90 Base) MCG/ACT inhaler Inhale 2 puffs into the lungs every 4 (four) hours as needed for wheezing or shortness of breath. 18 g 2  . ascorbic acid (VITAMIN C) 500 MG tablet Take 1 tablet (500 mg  total) by mouth daily. 30 tablet 1  . benzonatate (TESSALON) 100 MG capsule Take 2 capsules (200 mg total) by mouth 3 (three) times daily as needed for cough. 30 capsule 0  . escitalopram (LEXAPRO) 10 MG tablet TAKE 1 TABLET(10 MG) BY MOUTH DAILY 30 tablet 1  . FEROSUL 325 (65 Fe) MG tablet TAKE 1 TABLET BY MOUTH EVERY MORNING 30 tablet 3  . furosemide (LASIX) 80 MG tablet Take 1 tablet (80 mg total) by mouth 2 (two) times daily. 180 tablet 3  . HYDROcodone-acetaminophen (NORCO) 5-325 MG tablet Take 1 tablet by mouth every 6 (six) hours as needed for moderate pain. 60 tablet 0  . metoprolol succinate (TOPROL-XL) 100 MG 24 hr tablet Take 1 tablet (100 mg total) by mouth daily. 30 tablet 6  . Multiple Vitamin (MULTIVITAMIN) tablet Take 1 tablet by mouth daily.    . polyethylene glycol (MIRALAX / GLYCOLAX) 17 g packet Take 17 g by mouth daily. 14 each 0  . potassium chloride SA (KLOR-CON) 20 MEQ tablet Take 2 tablets (40 mEq total) by mouth daily. 180 tablet 3  . senna-docusate (SENOKOT-S) 8.6-50 MG tablet Take 2 tablets by mouth at bedtime. 60 tablet 1  . zolpidem (AMBIEN) 10 MG tablet  TAKE 1 TABLET(10 MG) BY MOUTH AT BEDTIME AS NEEDED FOR SLEEP 30 tablet 3   No current facility-administered medications for this visit.    Allergies:   Patient has no known allergies.    ROS:  Please see the history of present illness.   Otherwise, review of systems are positive for none.   All other systems are reviewed and negative.    PHYSICAL EXAM: VS:  BP (!) 150/90   Pulse 84   Ht 5\' 8"  (1.727 m)   Wt (!) 501 lb 9.6 oz (227.5 kg)   BMI 76.27 kg/m  , BMI Body mass index is 76.27 kg/m. GEN:  No distress NECK:  No jugular venous distention at 90 degrees, waveform within normal limits, carotid upstroke brisk and symmetric, no bruits, no thyromegaly LYMPHATICS:  No cervical adenopathy LUNGS:  Clear to auscultation bilaterally BACK:  No CVA tenderness CHEST:  Unremarkable HEART:  S1 and S2 within  normal limits, no S3, no S4, no clicks, no rubs, no murmurs ABD:  Positive bowel sounds normal in frequency in pitch, no bruits, no rebound, no guarding, unable to assess midline mass or bruit with the patient seated. EXT:  2 plus pulses throughout, severe bilateral edema, no cyanosis no clubbing SKIN:  No rashes no nodules NEURO:  Cranial nerves II through XII grossly intact, motor grossly intact throughout PSYCH:  Cognitively intact, oriented to person place and time  EKG:  EKG is  ordered today. The ekg ordered demonstrates sinus tachycardia, rate 94, axis within normal limits, intervals within normal limits, no acute ST-T wave changes.   Recent Labs: 11/02/2019: Magnesium 2.0 12/31/2019: ALT 12; BUN 11; Creat 0.97; Hemoglobin 9.8; Platelets 289; Potassium 4.3; Sodium 143 01/28/2020: BNP <2.5; TSH 5.270    Lipid Panel    Component Value Date/Time   TRIG 59 10/19/2019 1713      Wt Readings from Last 3 Encounters:  04/18/20 (!) 501 lb 9.6 oz (227.5 kg)  02/21/20 (!) 470 lb (213.2 kg)  01/28/20 (!) 458 lb (207.7 kg)     Other studies Reviewed: Additional studies/ records that were reviewed today include: Sleep study results Review of the above records demonstrates:  Please see elsewhere in the note.     ASSESSMENT AND PLAN:  HTN: Blood pressure is elevated.  I am going to be increasing his meds as below.  Hopefully between this and his sleep apnea treatment he will have a fall in his blood pressure.   MORBID OBESITY:   We had a long discussion about this with the patient's wife.   We talked very specifically about diet.  I think he needs to have his depression managed in order to make inroads on this and I talked to him about seeing his primary provider about this.   I am going to get him to see our health educator specifically to look for carbohydrate in his diet.  DM: A1c is 7.1.  No change in therapy.   SLEEP APNEA:    I had a power he does not yet have a CPAP and has not  completed a work-up from the past.  I will offer to help facilitate this.  HYPOXEMIA: Continues on 2 L.  Has had some RV dysfunction related most likely to hypoventilation obesity syndrome.  This needs to be managed with above.   LEG SWELLING: I am going to increase his Lasix to 80 mg twice daily.  I will increase his potassium to 40 mEq daily.  He  will need a basic metabolic profile in about 10 days.  Current medicines are reviewed at length with the patient today.  The patient does not have concerns regarding medicines.  The following changes have been made:    Labs/ tests ordered today include:    Orders Placed This Encounter  Procedures  . Basic Metabolic Panel (BMET)  . EKG 12-Lead     Disposition:   FU with APP in 1 month.    Signed, Rollene Rotunda, MD  04/18/2020 3:24 PM    [facility]

## 2020-04-17 NOTE — Procedures (Signed)
[facility]        Patient Name: Ricardo Lopez, Ricardo Lopez Date: 03/30/2020 Gender: Male D.O.B: October 21, 1975 Age (years): 37 Referring Provider: Nicki Guadalajara MD, ABSM Height (inches): 68 Interpreting Physician: Nicki Guadalajara MD, ABSM Weight (lbs): 470 RPSGT: Peak, Salbador BMI: 71 MRN: [id number] Neck Size: 20.00  CLINICAL INFORMATION The patient is referred for a BiPAP titration to treat sleep apnea  Date of NPSG:  02/24/2020:  AHI 21.3/h with absent REM sleep and minimal sleep duration; O2 nadir 85%.  SLEEP STUDY TECHNIQUE As per the AASM Manual for the Scoring of Sleep and Associated Events v2.3 (April 2016) with a hypopnea requiring 4% desaturations.  The channels recorded and monitored were frontal, central and occipital EEG, electrooculogram (EOG), submentalis EMG (chin), nasal and oral airflow, thoracic and abdominal wall motion, anterior tibialis EMG, snore microphone, electrocardiogram, and pulse oximetry. Bilevel positive airway pressure (BPAP) was initiated at the beginning of the study and titrated to treat sleep-disordered breathing.  MEDICATIONS HYDROcodone-acetaminophen (NORCO) 5-325 MG tablet acetaminophen (TYLENOL) 325 MG tablet albuterol (VENTOLIN HFA) 108 (90 Base) MCG/ACT inhaler ascorbic acid (VITAMIN C) 500 MG tablet benzonatate (TESSALON) 100 MG capsule escitalopram (LEXAPRO) 10 MG tablet FEROSUL 325 (65 Fe) MG tablet furosemide (LASIX) 40 MG tablet metoprolol succinate (TOPROL-XL) 100 MG 24 hr tablet Multiple Vitamin (MULTIVITAMIN) tablet pantoprazole (PROTONIX) 40 MG tablet polyethylene glycol (MIRALAX / GLYCOLAX) 17 g packet potassium chloride SA (KLOR-CON) 20 MEQ tablet senna-docusate (SENOKOT-S) 8.6-50 MG tablet zinc sulfate 220 (50 Zn) MG capsule zolpidem (AMBIEN) 10 MG tablet Medications self-administered by patient taken the night of the study : N/A  RESPIRATORY PARAMETERS Optimal IPAP Pressure (cm): 20 AHI at Optimal Pressure  (/hr) N/A Optimal EPAP Pressure (cm): 16   Overall Minimal O2 (%): 78.00 Minimal O2 at Optimal Pressure (%): 75.00 SLEEP ARCHITECTURE Start Time: 9:29:35 PM Stop Time: 4:11:51 AM Total Time (min): 402.3 Total Sleep Time (min): 213.5 Sleep Latency (min): 18.8 Sleep Efficiency (%): 53.1 REM Latency (min): 174.5 WASO (min): 169.9 Stage N1 (%): 5.39 Stage N2 (%): 71.90 Stage N3 (%): 0.00 Stage R (%): 22.7 Supine (%): 0.70 Arousal Index (/hr): 7.3   CARDIAC DATA The 2 lead EKG demonstrated sinus rhythm. The mean heart rate was 87.93 beats per minute. Other EKG findings include: None.  LEG MOVEMENT DATA The total Periodic Limb Movements of Sleep (PLMS) were 0. The PLMS index was 0.00. A PLMS index of <15 is considered normal in adults.  IMPRESSIONS - CPAP was initiated at 5 cm, titrated to 15 cm and transitioned to BiPAP. BiPAP was titrated to 20/16 cm of water; AHI 1.2/h, O2 nadir with 2 liters supplement oxygen was 93%. - Severe oxygen desaturations to a nadir of 78% at 12 cm; O2 at 2 l/m was initiated. - No snoring was audible during this study. - No cardiac abnormalities were observed during this study. - Clinically significant periodic limb movements were not noted during this study. Arousals associated with PLMs were rare.  DIAGNOSIS - Obstructive Sleep Apnea (G47.33) - Nocturnal Hypoxemia (G47.36)  RECOMMENDATIONS - Recommnend an initial trial of BiPAP Auto therapy with EPAP min of 16 cm, PS of 4 and IPAP max of 25 cm with 2 liters of supplemental oxygen. A large size Philips Respironics Nasal Pillow Mask Nuance Pro Gel mask was used for the titration. - Effort should be made to optimize nasal and oropharyngeal patency. - Avoid alcohol, sedatives and other CNS  depressants that may worsen sleep apnea and disrupt normal sleep architecture. - Sleep hygiene should be reviewed to assess factors that may improve sleep quality. - Weight management (BMI 71) and regular exercise should be  initiated or continued. - Recommend a downlioad in 3o days and sleep clinic evaluation after 4 weeks of therapy.  [Electronically signed] 04/17/2020 03:22 PM  Nicki Guadalajara MD, [facility], ABSM Diplomate, American Board of Sleep Medicine   NPI: [id number] [facility] PH: [phone]   FX: [phone] ACCREDITED BY THE AMERICAN ACADEMY OF SLEEP MEDICINE

## 2020-04-18 ENCOUNTER — Other Ambulatory Visit: Payer: Self-pay

## 2020-04-18 ENCOUNTER — Ambulatory Visit (INDEPENDENT_AMBULATORY_CARE_PROVIDER_SITE_OTHER): Payer: 59 | Admitting: Cardiology

## 2020-04-18 ENCOUNTER — Encounter: Payer: Self-pay | Admitting: Cardiology

## 2020-04-18 VITALS — BP 150/90 | HR 84 | Ht 68.0 in | Wt >= 6400 oz

## 2020-04-18 DIAGNOSIS — R000 Tachycardia, unspecified: Secondary | ICD-10-CM | POA: Diagnosis not present

## 2020-04-18 DIAGNOSIS — G4730 Sleep apnea, unspecified: Secondary | ICD-10-CM

## 2020-04-18 DIAGNOSIS — Z79899 Other long term (current) drug therapy: Secondary | ICD-10-CM

## 2020-04-18 DIAGNOSIS — R0902 Hypoxemia: Secondary | ICD-10-CM

## 2020-04-18 MED ORDER — FUROSEMIDE 80 MG PO TABS: 80.0000 mg | ORAL_TABLET | Freq: Two times a day (BID) | ORAL | 3 refills | Status: DC

## 2020-04-18 MED ORDER — POTASSIUM CHLORIDE CRYS ER 20 MEQ PO TBCR: 40.0000 meq | EXTENDED_RELEASE_TABLET | Freq: Every day | ORAL | 3 refills | Status: DC

## 2020-04-18 NOTE — Patient Instructions (Signed)
Medication Instructions:  INCREASE- Furosemide(Lasix) 80 mg by twice a day INCREASE- Potassium 40 meq by mouth daily  *If you need a refill on your cardiac medications before your next appointment, please call your pharmacy*   Lab Work: BMP in 10 days  If you have labs (blood work) drawn today and your tests are completely normal, you will receive your results only by: Marland Kitchen MyChart Message (if you have MyChart) OR . A paper copy in the mail If you have any lab test that is abnormal or we need to change your treatment, we will call you to review the results.   Testing/Procedures: None Ordered   Follow-Up: At [facility], you and your health needs are our priority.  As part of our continuing mission to provide you with exceptional heart care, we have created designated Provider Care Teams.  These Care Teams include your primary Cardiologist (physician) and Advanced Practice Providers (APPs -  Physician Assistants and Nurse Practitioners) who all work together to provide you with the care you need, when you need it.  We recommend signing up for the patient portal called "MyChart".  Sign up information is provided on this After Visit Summary.  MyChart is used to connect with patients for Virtual Visits (Telemedicine).  Patients are able to view lab/test results, encounter notes, upcoming appointments, etc.  Non-urgent messages can be sent to your provider as well.   To learn more about what you can do with MyChart, go to [email or web address].    Your next appointment:   1 month(s)  The format for your next appointment:   In Person  Provider:    Edd Fabian, FNP

## 2020-04-25 ENCOUNTER — Telehealth: Payer: Self-pay | Admitting: *Deleted

## 2020-04-25 ENCOUNTER — Telehealth: Payer: Self-pay

## 2020-04-25 DIAGNOSIS — Z0000 Encounter for general adult medical examination without abnormal findings: Secondary | ICD-10-CM

## 2020-04-25 MED ORDER — HYDROCORTISONE ACETATE 25 MG RE SUPP: 25.0000 mg | Freq: Two times a day (BID) | RECTAL | 0 refills | Status: DC

## 2020-04-25 NOTE — Telephone Encounter (Signed)
Call placed to patient and patient spouse made aware. States that she will discuss with a patient and call back to schedule OV.

## 2020-04-25 NOTE — Telephone Encounter (Signed)
I am fine with referral to GI however usually we try treating this with hydrocortisone 2.5% cream twice daily.  Hemorrhoids will only go away if they are surgically removed or have a rubber band put around them.  If they are just irritated, we can try the cream first.  What ever they prefer is fine with me.  I would like to see Ricardo Lopez back to recheck his blood pressure and see how he is doing.

## 2020-04-25 NOTE — Telephone Encounter (Signed)
Called patient to discuss health coaching per Dr. Jenene Slicker referral regarding reducing carbohydrates from diet. Patient stated that he has an appointment for weight management on 4/28 and would like to see what they are able to do first, but will call back if that isn't a good fit for him.

## 2020-04-25 NOTE — Telephone Encounter (Signed)
Received call from patient spouse.    Reports that patient has noted increased sensitivity to hemorrhoids. States that he has been seeing bright red blood in stools.   Requested referral to GI. Ok to place referral?

## 2020-04-30 ENCOUNTER — Other Ambulatory Visit: Payer: Self-pay | Admitting: Family Medicine

## 2020-05-01 NOTE — Telephone Encounter (Signed)
Ok to refill??  Last office visit 12/31/2019.  Last refill 12/28/2019, #3 refills.

## 2020-05-02 LAB — BASIC METABOLIC PANEL
BUN/Creatinine Ratio: 12 (ref 9–20)
BUN: 13 mg/dL (ref 6–24)
CO2: 22 mmol/L (ref 20–29)
Calcium: 9.6 mg/dL (ref 8.7–10.2)
Chloride: 102 mmol/L (ref 96–106)
Creatinine, Ser: 1.13 mg/dL (ref 0.76–1.27)
Glucose: 110 mg/dL — ABNORMAL HIGH (ref 65–99)
Potassium: 4.3 mmol/L (ref 3.5–5.2)
Sodium: 143 mmol/L (ref 134–144)
eGFR: 82 mL/min/{1.73_m2} (ref >59)

## 2020-05-04 ENCOUNTER — Encounter: Payer: Self-pay | Admitting: Family Medicine

## 2020-05-04 ENCOUNTER — Other Ambulatory Visit: Payer: Self-pay

## 2020-05-04 ENCOUNTER — Ambulatory Visit: Payer: 59 | Admitting: Family Medicine

## 2020-05-04 VITALS — BP 152/90 | HR 96 | Temp 98.8°F | Resp 18 | Ht 68.0 in | Wt >= 6400 oz

## 2020-05-04 DIAGNOSIS — J9611 Chronic respiratory failure with hypoxia: Secondary | ICD-10-CM | POA: Diagnosis not present

## 2020-05-04 DIAGNOSIS — U099 Post covid-19 condition, unspecified: Secondary | ICD-10-CM

## 2020-05-04 DIAGNOSIS — F411 Generalized anxiety disorder: Secondary | ICD-10-CM

## 2020-05-04 DIAGNOSIS — F321 Major depressive disorder, single episode, moderate: Secondary | ICD-10-CM

## 2020-05-04 MED ORDER — CEPHALEXIN 500 MG PO CAPS: 500.0000 mg | ORAL_CAPSULE | Freq: Three times a day (TID) | ORAL | 0 refills | Status: DC

## 2020-05-04 MED ORDER — BUPROPION HCL ER (XL) 150 MG PO TB24: 150.0000 mg | ORAL_TABLET | Freq: Every day | ORAL | 5 refills | Status: DC

## 2020-05-04 MED ORDER — ESCITALOPRAM OXALATE 20 MG PO TABS: 20.0000 mg | ORAL_TABLET | Freq: Every day | ORAL | 5 refills | Status: DC

## 2020-05-08 NOTE — Progress Notes (Signed)
Subjective:    Patient ID: Ricardo Lopez., male    DOB: 1975/08/07, 45 y.o.   MRN: [id number]  HPI Patient was admitted in September for COVID pneumonia.  Subsequently readmitted for E.coli bacteremia ultimately determined to be due to a perirectal infection.  See most recent discharge summary: Admit date: 10/30/2019 Discharge date: 11/03/2019  Admitted From: Home Disposition: Home  Recommendations for Outpatient Follow-up:  1. Follow up with PCP in 1-2 weeks 2. Please obtain BMP/CBC in one week 3. Follow-up with general surgery as needed 4. He has been referred to outpatient physical therapy for wound care  Home Health: Home health RN Equipment/Devices:  Discharge Condition: Stable CODE STATUS: Full code Diet recommendation: Heart healthy  Brief/Interim Summary: 45 year old male with a history of hypertension, morbid obesity, diabetes mellitus type 2 presenting with increasing pain and drainage from his buttocks. The patient was recently admitted to the hospital from 10/19/2019 to 10/22/2019 secondary to respiratory failure as well as E. coli bacteremia. The patient was discharged home with cefdinir which he finished on 10/30/2019. It was felt that his respiratory failure was likely secondary to complications from his COVID-19 pneumonia in the setting of morbid obesity and OHS. He was discharged home on 2 L nasal cannula. In addition, the patient was also recently discharged after a stay from 09/29/2019 to 10/07/2019 after treatment for a COVID-19 pneumonia. The patient states that he had some gluteal pain even before he left the hospital on 10/22/2019. However, he has noted increasing edema and erythema and pain for the last 3 to 4 days. He is also noted some purulent drainage. He denied any fevers, chills, chest pain, shortness breath, cough, hemoptysis, vomiting, diarrhea, abdominal pain. In the emergency department, the patient had low-grade temperature of 9 9.1 F. He  was mildly tachycardic up to 110. He was hemodynamically stable. Oxygen saturation was 97% on room air. The patient was started on vancomycin and Zosyn. General surgery was consulted to assist with management.  Discharge Diagnoses:  Principal Problem:   Abscess and cellulitis of gluteal region Active Problems:   Severe obesity (BMI >= 40) (HCC)   Acute respiratory failure with hypoxia due to Covid    Microcytic anemia   Sepsis due to undetermined organism (HCC)   Chronic respiratory failure with hypoxia (HCC)  Sepsis -Present on admission -Secondary to gluteal abscess -Presented with tachycardia and WBC up to 16.9 -Lactic acid 1.6 -Follow cultures--VRE+Ecoli -Continue vancomycin and Zosyn  Gluteal abscess -Appreciate surgical consult -Case discussed with Dr. Jorja Loa suspicion of fistula at this time-->treat as gluteal abscess with outpatient follow-up and consideration for MRI as an outpatient if clinically worsens or no improvement -Continue vancomycin and Zosyn -WBC decreasing 16.9>>14.0>>10.8 -NON-surgical setting cultures = VRE and Ecoli -Doubt VRE is true pathogen as patient is improving and WBC improving without active tx against VRE -Treated with vancomycin/Zosyn -Since he is clinically improved, antibiotics transition to doxycycline/Augmentin -He has been referred for outpatient wound care with physical therapy -will need HHRN to help with dressing changes  Controlled diabetes mellitus type 2 -10/19/2019 hemoglobin A1c 7.1 -NovoLog sliding scale  Morbid obesity -BMI 60.82 -Lifestyle modification  Chronic respiratory failure with hypoxia -Stable on 2 L nasal cannula -Encourage incentive spirometry  Iron deficiency anemia -10/20/2019 iron saturation 7% -Ferritin 289 -start iron once infx resolved, as an outpatient  11/09/19 Patient is here today for follow-up.  He has seen wound care every week.  They have appointment to see him tomorrow.  He  states that the wound is improving on his gluteal area and that the pain is getting better.  He denies any fever.  He denies any chills.  He denies any nausea or vomiting.  He does have significant pain in the gluteal area especially when he sits which is primarily the majority of the day.  He is still on oxygen 2 L via nasal cannula.  He is 97% on 2 L but if he takes his oxygen off it drops to 89% even at rest.  He denies any chest pain however he does have shortness of breath with minimal activity.  He also continues to have a nonproductive cough.  Fortunately his lungs sound relatively clear on examination today.  We reviewed his lab work and we discussed his diabetes diagnosis that he was unaware of.  We discussed dietary changes.  We also mentioned that he is severely anemic and that his iron level was low.  I recommended starting iron sulfate 324 mg daily.  At the present time he is not taking any iron supplement.  He denies any pleurisy he does have fibrotic changes in both legs consistent with chronic lymphedema.  He does have edema in both legs to the knees but there is no evidence of a DVT or PE on his exam today.  At that time, my plan was: Discontinue tramadol and replace with Norco 5/325 1-2 p.o. every 6 hours as needed pain.  He can also use Tussionex 1 teaspoon every 12 hours as needed for cough.  Clinically his cellulitis is improving the gluteal region.  He has no signs of systemic illness or bacteremia such as fever, etc.  I will repeat a blood culture today and also check a CBC to monitor for any leukocytosis.  Complete Augmentin and doxycycline as prescribed.  Patient is seeing wound clinic for wound checks and he has an appointment tomorrow.  Repeat chest x-ray in 2 weeks and see the patient back in 2 weeks to reassess weaning off oxygen.  At the present time he is not ready to do so.  At that point I will give the patient a flu shot.  Would recommend a Covid vaccine 90 days after his  infection.  Discussed diabetes and recommended a low carbohydrate diet.  11/23/19 Patient is here today for a checkup.  His oxygen is 93% on room air.  However he states that when he is up walking around, it will drop into the mid 80s.  He does report some pleurisy with deep inspiration and he still has a resting tachycardia of 104 to 108 bpm.  He denies any hemoptysis or purulent sputum.  His cough is getting better.  Chest x-ray looks considerably better compared to the chest x-ray from September 28 although it does have some residual opacities which may reflect scar tissue.  He is due to recheck his CBC to monitor his anemia.  He is on iron tablet.  He reports that he is not sleeping.  He states that he can only get about 2 hours of sleep to 3 hours of sleep and then he is wide-awake.  There is no particular symptom that is keeping him awake is just that he is unable to rest.  He also continues to have pain around the the healing wound on his right gluteus.  I examined the wound today with his wife.  She is doing an excellent job taking care of the wound.  There is a circular patch of granulation tissue roughly  5 cm x 3 cm.  There is no ulcer or cavity.  It is filled in almost completely.  It is still tender to the touch.  There is no surrounding erythema or warmth to suggest cellulitis.  There is no palpable fluctuance.  The wound appears to be healing well.  He is still seeing wound care.  At that time, my plan was: Given his tachycardia and pleurisy and shortness of breath I will check a D-dimer to evaluate for possible pulmonary embolism given his recent Covid.  If D-dimer is positive I would recommend a CT angiogram of the chest.  Recheck CBC to monitor his anemia.  Also recheck a CMP to monitor his glucose and kidney function.  However I am pleased by his breath sounds today and the improvement on his chest x-ray.  I hope that over the next 3 to 4 weeks he will be able to wean off oxygen completely.  I  will give him Ambien 10 mg p.o. nightly as needed insomnia but I encouraged him to try to wean away from the pain medication and not to take the pain medication at the same time as the Ambien.  Also gave the patient compression hose prescription for knee-high compression hose 15 to 20 mmHg to help with the lymphedema in his legs.  As a side note, the patient reports numbness and tingling in both hands distal to the wrist with pain in his fingers.  I suspect carpal tunnel syndrome.  We discussed nerve conduction studies but we will defer that at the present time.  12/21/19 Patient has rapidly gained weight.  His weight is up over 50 pounds since I last saw him.  His legs are swollen and tight and tender to palpation.  He also reports shortness of breath with activity.  He had a positive D-dimer at his last visit so we did obtain a CT scan but no pulmonary embolism was found only residual inflammation in the lungs from his Covid pneumonia.  However I am concerned about right-sided heart failure due to this as well as due to his morbid obesity.  I believe the patient likely has diastolic heart failure even though he had a normal echocardiogram in September 29.  He denies any chest pain.  He does have some orthopnea.  He also reports depression and anxiety.  He has having trouble sleeping.  He reports panic attacks.  He states that if his wife leaves him alone he panics.  She states that if he wakes up and she is not in the home he will panic.  At that time, my plan was: Patient clearly is fluid overloaded.  I believe the patient has acute diastolic heart failure or right-sided heart failure due to his underlying lung damage.  Begin Lasix 40 mg twice daily along with K-Dur 20 milliequivalents daily.  Check BNP, CBC, CMP.  Reassess weight and renal function in 1 week and titrate diuretics to achieve the desired effect.  Meanwhile start Lexapro for GAD 10 mg a day.  12/31/19 Patient has diuresed and lost over 20  pounds of fluid.  The swelling in his legs is better although it is still very prominent.  His breathing is slightly better.  He states however if he sits without oxygen his oxygen will drop to 90% on room air.  He denies any cramping.  He denies any dizziness.  He denies any lightheadedness however he does continue to have dyspnea on exertion and easy fatigability.  He continues  to have severe pain in his ankles and in his legs with ambulation.  He is using hydrocodone 1-2 times a day.  The Lexapro was not helping with panic attacks however he is only been on the medication for about 10 days.  I explained to him that this is to be expected and that we need to be patient.  At that time, my plan was: I believe the patient has diastolic heart failure.  He may also have right-sided heart failure.  I believe is due to a combination of morbid obesity, scarring in the lungs due to Covid, untreated obstructive sleep apnea.  Therefore continue Lasix 40 mg twice daily.  Check a CMP to monitor electrolytes to determine if we need to increase potassium supplementation or decrease diuretic.  I would like to see the patient lose an additional 20 pounds of fluid.  Therefore I would like to recheck via telephone in 1 week assuming his electrolytes are normal.  However I believe part of his issue could be his tachycardia.  The tachycardia decreases filling time for the heart and if he does have a stiff heart consistent with diastolic dysfunction this only exacerbates his decreased cardiac output.  Therefore I will start the patient on metoprolol XL 25 mg a day in an effort to try to slow his tachycardia and improve filling time to increase cardiac output.  I will also consult cardiology for any additional recommendations to manage diastolic dysfunction.  I did refill his hydrocodone 5/325 1 tablet twice daily 60 tablets/month.  Reassess the patient via telephone in 1 week.  Try to achieve an additional 20 pounds of diuresis  assuming electrolytes are normal  05/08/20 Patient has gained over 50 pounds since I last saw him!.  While some of this is certainly swelling I believe most of this is likely obesity.  He admits to overeating.  He admits that he has emotional issues and that "I have a problem".  He is clearly depressed.  He reports daily anxiety.  He reports anhedonia.  He reports depression.  Lexapro is not helping.  He is also asking to increase his painkillers.  He is taking 2 a day for 6-month total.  He is extremely sedentary.  I declined.  I stated that I feel that that would lead to a problem.  Furthermore, I feel that this is pointing to habituation and dependency.  I was clear with the patient.  I stated that we need to make serious changes or else his life expectancy is going to be drastically reduced.  We need to increase his activity, change his dietary habits, achieve significant weight loss.  All of this is necessary in order to prolong his life and improve his quality of life.  He agrees.  He has an appointment to see weight management coming up.  Cardiology just increased his fluid pills.  He does report symptoms of a sinus infection.  He also reports pain in his right gluteus.  It is posterior to the greater trochanter.  That area is not indurated or firm or red.  There is no palpable abnormality.  There is no palpable abscess.  There is no visible deformity.  Is been tender in that area for approximately a week.  Range of motion in the hip does not seem to exacerbate it.  Differential diagnosis includes musculoskeletal pain. Past Medical History:  Diagnosis Date  . COVID    9/21- hypoxia requiring oxygen  . Diabetes mellitus without complication (HCC)   .  Gluteal abscess    ecoli after covid hospitalization 2021  . Hypertension   . Obesity   . Thoracic aortic aneurysm [facility](HCC)    Past Surgical History:  Procedure Laterality Date  . None     Current Outpatient Medications on File Prior to Visit   Medication Sig Dispense Refill  . acetaminophen (TYLENOL) 325 MG tablet Take 650 mg by mouth every 6 (six) hours as needed.    Marland Kitchen. albuterol (VENTOLIN HFA) 108 (90 Base) MCG/ACT inhaler Inhale 2 puffs into the lungs every 4 (four) hours as needed for wheezing or shortness of breath. 18 g 2  . ascorbic acid (VITAMIN C) 500 MG tablet Take 1 tablet (500 mg total) by mouth daily. 30 tablet 1  . benzonatate (TESSALON) 100 MG capsule Take 2 capsules (200 mg total) by mouth 3 (three) times daily as needed for cough. 30 capsule 0  . FEROSUL 325 (65 Fe) MG tablet TAKE 1 TABLET BY MOUTH EVERY MORNING 30 tablet 3  . furosemide (LASIX) 80 MG tablet Take 1 tablet (80 mg total) by mouth 2 (two) times daily. 180 tablet 3  . HYDROcodone-acetaminophen (NORCO) 5-325 MG tablet Take 1 tablet by mouth every 6 (six) hours as needed for moderate pain. 60 tablet 0  . metoprolol succinate (TOPROL-XL) 100 MG 24 hr tablet Take 1 tablet (100 mg total) by mouth daily. 30 tablet 6  . Multiple Vitamin (MULTIVITAMIN) tablet Take 1 tablet by mouth daily.    . potassium chloride SA (KLOR-CON) 20 MEQ tablet Take 2 tablets (40 mEq total) by mouth daily. 180 tablet 3  . zolpidem (AMBIEN) 10 MG tablet TAKE 1 TABLET(10 MG) BY MOUTH AT BEDTIME AS NEEDED FOR SLEEP 30 tablet 3   No current facility-administered medications on file prior to visit.   No Known Allergies Social History   Socioeconomic History  . Marital status: Single    Spouse name: Not on file  . Number of children: Not on file  . Years of education: Not on file  . Highest education level: Not on file  Occupational History  . Not on file  Tobacco Use  . Smoking status: Never Smoker  . Smokeless tobacco: Never Used  Vaping Use  . Vaping Use: Never used  Substance and Sexual Activity  . Alcohol use: Yes    Comment: occasional  . Drug use: No  . Sexual activity: Yes  Other Topics Concern  . Not on file  Social History Narrative   Lives at home with wife and  two children.  Four children in all.     Social Determinants of Health   Financial Resource Strain: Not on file  Food Insecurity: Not on file  Transportation Needs: Not on file  Physical Activity: Not on file  Stress: Not on file  Social Connections: Not on file  Intimate Partner Violence: Not on file    Review of Systems  All other systems reviewed and are negative.      Objective:   Physical Exam Vitals reviewed.  Constitutional:      Appearance: He is obese.  Cardiovascular:     Rate and Rhythm: Normal rate and regular rhythm.     Heart sounds: Normal heart sounds.  Pulmonary:     Effort: Pulmonary effort is normal. No respiratory distress.     Breath sounds: Normal breath sounds. No wheezing, rhonchi or rales.  Musculoskeletal:        General: Deformity present.     Right lower leg:  Edema present.     Left lower leg: Edema present.  Neurological:     Mental Status: He is alert.    Wt Readings from Last 3 Encounters:  05/04/20 (!) 486 lb (220.4 kg)  04/18/20 (!) 501 lb 9.6 oz (227.5 kg)  02/21/20 (!) 470 lb (213.2 kg)          Assessment & Plan:  COVID-19 long hauler - Plan: DG Chest 2 View  GAD (generalized anxiety disorder)  Chronic respiratory failure with hypoxia (HCC)  Morbid obesity (HCC)  Depression, major, single episode, moderate (HCC)  First, I will try to address his depression.  We increase Lexapro to 20 mg a day and added Wellbutrin extended release 150 mg p.o. every morning as an adjunctive therapy.  Reassess in 4 weeks.  Second I declined to increase his hydrocodone due to concerns over habituation and dependency.  Third we need to address his morbid obesity.  I certainly support him seeing weight management.  We also discussed options and I gave him samples of Trulicity 0.75 mg subcu weekly.  In 2 weeks if tolerating, he will increase to 1.5 mg subcu weekly thereafter.  Also explained to the patient that he needs to make drastic lifestyle  changes in order to prevent early mortality and reduce his morbidity.  Therefore I have strongly encouraged exercise, weight loss, and dietary changes.  Obtain chest x-ray as he continues to report pleurisy and shortness of breath however his pulmonary exam today is excellent and I feel the majority of the shortness of breath at this point is due to morbid obesity and deconditioning.

## 2020-05-12 ENCOUNTER — Telehealth: Payer: Self-pay | Admitting: *Deleted

## 2020-05-12 NOTE — Telephone Encounter (Signed)
Received call from patient insurance.   Reports that patient is requesting portable Oxygen concentrator.   Noted that orders were sent to [facility] in December. Call placed to [employer]. They will F/U with patient.

## 2020-05-15 NOTE — Telephone Encounter (Signed)
Received call from patient.   Reports that they have not received any portable oxygen concentrator and is requesting order to be re-submitted. Also requesting additional oxygen tank so that he can be away from home longer.   Orders placed.

## 2020-05-16 ENCOUNTER — Other Ambulatory Visit: Payer: Self-pay | Admitting: Family Medicine

## 2020-05-16 MED ORDER — HYDROCODONE-ACETAMINOPHEN 5-325 MG PO TABS: 1.0000 | ORAL_TABLET | Freq: Four times a day (QID) | ORAL | 0 refills | Status: DC | PRN

## 2020-05-18 ENCOUNTER — Encounter (INDEPENDENT_AMBULATORY_CARE_PROVIDER_SITE_OTHER): Payer: Self-pay | Admitting: Family Medicine

## 2020-05-18 ENCOUNTER — Other Ambulatory Visit: Payer: Self-pay

## 2020-05-18 ENCOUNTER — Ambulatory Visit (INDEPENDENT_AMBULATORY_CARE_PROVIDER_SITE_OTHER): Payer: 59 | Admitting: Family Medicine

## 2020-05-18 VITALS — BP 122/66 | HR 83 | Temp 98.0°F | Ht 68.0 in | Wt >= 6400 oz

## 2020-05-18 DIAGNOSIS — E66813 Obesity, class 3: Secondary | ICD-10-CM

## 2020-05-18 DIAGNOSIS — D508 Other iron deficiency anemias: Secondary | ICD-10-CM

## 2020-05-18 DIAGNOSIS — Z1331 Encounter for screening for depression: Secondary | ICD-10-CM | POA: Diagnosis not present

## 2020-05-18 DIAGNOSIS — Z6841 Body Mass Index (BMI) 40.0 and over, adult: Secondary | ICD-10-CM

## 2020-05-18 DIAGNOSIS — Z0289 Encounter for other administrative examinations: Secondary | ICD-10-CM

## 2020-05-18 DIAGNOSIS — E1165 Type 2 diabetes mellitus with hyperglycemia: Secondary | ICD-10-CM | POA: Diagnosis not present

## 2020-05-18 DIAGNOSIS — Z9189 Other specified personal risk factors, not elsewhere classified: Secondary | ICD-10-CM

## 2020-05-18 DIAGNOSIS — R5383 Other fatigue: Secondary | ICD-10-CM

## 2020-05-18 DIAGNOSIS — F321 Major depressive disorder, single episode, moderate: Secondary | ICD-10-CM

## 2020-05-18 DIAGNOSIS — E538 Deficiency of other specified B group vitamins: Secondary | ICD-10-CM

## 2020-05-18 DIAGNOSIS — R0602 Shortness of breath: Secondary | ICD-10-CM | POA: Diagnosis not present

## 2020-05-18 DIAGNOSIS — E559 Vitamin D deficiency, unspecified: Secondary | ICD-10-CM

## 2020-05-18 DIAGNOSIS — G4733 Obstructive sleep apnea (adult) (pediatric): Secondary | ICD-10-CM

## 2020-05-19 LAB — COMPREHENSIVE METABOLIC PANEL
ALT: 25 IU/L (ref 0–44)
AST: 17 IU/L (ref 0–40)
Albumin/Globulin Ratio: 1.4 (ref 1.2–2.2)
Albumin: 4.3 g/dL (ref 4.0–5.0)
Alkaline Phosphatase: 92 IU/L (ref 44–121)
BUN/Creatinine Ratio: 13 (ref 9–20)
BUN: 13 mg/dL (ref 6–24)
Bilirubin Total: <0.2 mg/dL (ref 0.0–1.2)
CO2: 26 mmol/L (ref 20–29)
Calcium: 9.5 mg/dL (ref 8.7–10.2)
Chloride: 99 mmol/L (ref 96–106)
Creatinine, Ser: 0.98 mg/dL (ref 0.76–1.27)
Globulin, Total: 3.1 g/dL (ref 1.5–4.5)
Glucose: 106 mg/dL — ABNORMAL HIGH (ref 65–99)
Potassium: 4.4 mmol/L (ref 3.5–5.2)
Sodium: 141 mmol/L (ref 134–144)
Total Protein: 7.4 g/dL (ref 6.0–8.5)
eGFR: 98 mL/min/{1.73_m2} (ref >59)

## 2020-05-19 LAB — CBC WITH DIFFERENTIAL
Basophils Absolute: 0 10*3/uL (ref 0.0–0.2)
Basos: 1 %
EOS (ABSOLUTE): 0.1 10*3/uL (ref 0.0–0.4)
Eos: 1 %
Hemoglobin: 10.2 g/dL — ABNORMAL LOW (ref 13.0–17.7)
Immature Grans (Abs): 0 10*3/uL (ref 0.0–0.1)
Immature Granulocytes: 0 %
Lymphocytes Absolute: 2 10*3/uL (ref 0.7–3.1)
Lymphs: 23 %
MCH: 20.2 pg — ABNORMAL LOW (ref 26.6–33.0)
MCHC: 29.2 g/dL — ABNORMAL LOW (ref 31.5–35.7)
MCV: 69 fL — ABNORMAL LOW (ref 79–97)
Monocytes Absolute: 0.6 10*3/uL (ref 0.1–0.9)
Monocytes: 7 %
Neutrophils Absolute: 6.1 10*3/uL (ref 1.4–7.0)
Neutrophils: 68 %
RBC: 5.05 x10E6/uL (ref 4.14–5.80)
RDW: 16 % — ABNORMAL HIGH (ref 11.6–15.4)
WBC: 8.8 10*3/uL (ref 3.4–10.8)

## 2020-05-19 LAB — LIPID PANEL WITH LDL/HDL RATIO
Cholesterol, Total: 221 mg/dL — ABNORMAL HIGH (ref 100–199)
HDL: 53 mg/dL (ref >39)
LDL Chol Calc (NIH): 145 mg/dL — ABNORMAL HIGH (ref 0–99)
LDL/HDL Ratio: 2.7 ratio (ref 0.0–3.6)
Triglycerides: 127 mg/dL (ref 0–149)
VLDL Cholesterol Cal: 23 mg/dL (ref 5–40)

## 2020-05-19 LAB — T4: T4, Total: 7.2 ug/dL (ref 4.5–12.0)

## 2020-05-19 LAB — HEMOGLOBIN A1C
Est. average glucose Bld gHb Est-mCnc: 131 mg/dL
Hgb A1c MFr Bld: 6.2 % — ABNORMAL HIGH (ref 4.8–5.6)

## 2020-05-19 LAB — INSULIN, RANDOM: INSULIN: 24.3 u[IU]/mL (ref 2.6–24.9)

## 2020-05-19 LAB — ANEMIA PANEL
Ferritin: 125 ng/mL (ref 30–400)
Folate, Hemolysate: 494 ng/mL
Folate, RBC: 1415 ng/mL (ref >498)
Hematocrit: 34.9 % — ABNORMAL LOW (ref 37.5–51.0)
Iron Saturation: 19 % (ref 15–55)
Iron: 58 ug/dL (ref 38–169)
Retic Ct Pct: 1.4 % (ref 0.6–2.6)
Total Iron Binding Capacity: 309 ug/dL (ref 250–450)
UIBC: 251 ug/dL (ref 111–343)
Vitamin B-12: 603 pg/mL (ref 232–1245)

## 2020-05-19 LAB — T3: T3, Total: 122 ng/dL (ref 71–180)

## 2020-05-19 LAB — FOLATE: Folate: 10.5 ng/mL (ref >3.0)

## 2020-05-19 LAB — MICROALBUMIN / CREATININE URINE RATIO
Creatinine, Urine: 98.7 mg/dL
Microalb/Creat Ratio: 4 mg/g creat (ref 0–29)
Microalbumin, Urine: 4.4 ug/mL

## 2020-05-19 LAB — TSH: TSH: 4.68 u[IU]/mL — ABNORMAL HIGH (ref 0.450–4.500)

## 2020-05-19 LAB — VITAMIN D 25 HYDROXY (VIT D DEFICIENCY, FRACTURES): Vit D, 25-Hydroxy: 28.8 ng/mL — ABNORMAL LOW (ref 30.0–100.0)

## 2020-05-22 NOTE — Progress Notes (Signed)
Dear Dr. Antoine Lopez,   Thank you for referring Ricardo Lopez. to our clinic. The following note includes my evaluation and treatment recommendations.  Chief Complaint:   OBESITY Ricardo Lopez (MR# [id number]) is a 45 y.o. male who presents for evaluation and treatment of obesity and related comorbidities. Current BMI is Body mass index is 72.83 kg/m. Ricardo Lopez has been struggling with his weight for many years and has been unsuccessful in either losing weight, maintaining weight loss, or reaching his healthy weight goal.  Ricardo Lopez is currently in the action stage of change and ready to dedicate time achieving and maintaining a healthier weight. Ricardo Lopez is interested in becoming our patient and working on intensive lifestyle modifications including (but not limited to) diet and exercise for weight loss.  Ricardo Lopez was referred by Dr. Antoine Lopez. Breakfast- chicken biscuit from [employer] with strawberry muffin and 32 oz soda- eat all biscuit, most of muffin, doesn't drink all soda (feel satisfied); 12-2 PM Burger with bacon, cheese, lettuce from [employer] with fries- eat all burger but not all fries with diet green tea (feel full); 7 PM- crock pot protein- 2 smoked [place] kegs + green beans + corn, +/- potatoes (1 cup) (feel full), sometimes soda or diet green tea; Dessert- raisin cake without raisins (1 cup/2 cup sizes) +/- bowl of cereal ([employer] or Honey Nut Cheerios) 2 cups + whole milk (doesn't drink milk afterwards.  Ricardo Lopez's habits were reviewed today and are as follows: His family eats meals together, he thinks his family will eat healthier with him, his desired weight loss is 229 lbs, he started gaining weight in 2019, his heaviest weight ever was 501 pounds, he is a picky eater and doesn't like to eat healthier foods, he has significant food cravings issues, he snacks frequently in the evenings, he skips meals frequently, he is frequently drinking liquids with calories, he frequently  makes poor food choices and he struggles with emotional eating.  Depression Screen Ricardo Lopez's Food and Mood (modified PHQ-9) score was 11.  Depression screen Ricardo Lopez 2/9 05/18/2020  Decreased Interest 2  Down, Depressed, Hopeless 2  PHQ - 2 Score 4  Altered sleeping 0  Tired, decreased energy 1  Change in appetite 3  Feeling bad or failure about yourself  0  Trouble concentrating 3  Moving slowly or fidgety/restless 0  Suicidal thoughts 0  PHQ-9 Score 11  Difficult doing work/chores Not difficult at all   Subjective:   1. Other fatigue Ricardo Lopez admits to daytime somnolence and admits to waking up still tired. Patent has a history of symptoms of daytime fatigue, morning fatigue and morning headache. Ricardo Lopez generally gets 4 or 5 hours of sleep per night, and states that he has poor sleep quality. Snoring is present. Apneic episodes are present. Epworth Sleepiness Score is 11. EKG normal sinus rhythm at 94 bpm.  2. SOB (shortness of breath) on exertion Ricardo Lopez notes increasing shortness of breath with exercising and seems to be worsening over time with weight gain. He notes getting out of breath sooner with activity than he used to. This has gotten worse recently. Ricardo Lopez denies shortness of breath at rest or orthopnea. EKG normal sinus rhythm at 94 bpm.  3. Type 2 diabetes mellitus with hyperglycemia, without long-term current use of insulin (HCC) Ricardo Lopez is on Trulicity 0.75 mg SubQ weekly. His A1c was 7.1 in Sept 2021.  4. OSA (obstructive sleep apnea) BiPAP is on backorder. Pt has supplies but is still waiting  for machine.  5. Depression, major, single episode, moderate (HCC) Ricardo Lopez is on Lexapro 20 mg and Wellbutrin 150 mg daily. His symptoms are somewhat improved.  6. Other iron deficiency anemia Ricardo Lopez is on PO iron. History of sickle cell trait in his son, but pt is unsure if her's been tested.  7. Vitamin D deficiency Ricardo Lopez reports fatigue but is not on a Vit D supplement.  8.  Vitamin B12 deficiency Ricardo Lopez is on B12. He reports fatigue.   9. At risk for deficient knowledge of diabetes mellitus Ricardo Lopez is at risk for deficient knowledge of diabetes mellitus.  Assessment/Plan:   1. Other fatigue Ricardo Lopez does feel that his weight is causing his energy to be lower than it should be. Fatigue may be related to obesity, depression or many other causes. Labs will be ordered, and in the meanwhile, Ricardo Lopez will focus on self care including making healthy food choices, increasing physical activity and focusing on stress reduction. Check labs today.  - TSH - T3 - T4 - CBC With Differential  2. SOB (shortness of breath) on exertion Ricardo Lopez does feel that he gets out of breath more easily that he used to when he exercises. Ricardo Lopez's shortness of breath appears to be obesity related and exercise induced. He has agreed to work on weight loss and gradually increase exercise to treat his exercise induced shortness of breath. Will continue to monitor closely.  3. Type 2 diabetes mellitus with hyperglycemia, without long-term current use of insulin (HCC) Good blood sugar control is important to decrease the likelihood of diabetic complications such as nephropathy, neuropathy, limb loss, blindness, coronary artery disease, and death. Intensive lifestyle modification including diet, exercise and weight loss are the first line of treatment for diabetes. Check labs today.  - Insulin, random - Comprehensive metabolic panel - Hemoglobin A1c - Lipid Panel With LDL/HDL Ratio - Urine Microalbumin w/creat. ratio  4. OSA (obstructive sleep apnea) Intensive lifestyle modifications are the first line treatment for this issue. We discussed several lifestyle modifications today and he will continue to work on diet, exercise and weight loss efforts. We will continue to monitor. Orders and follow up as documented in patient record. Follow up at next appointment.  5. Depression, major, single  episode, moderate (HCC) Behavior modification techniques were discussed today to help Ricardo Lopez deal with his emotional/non-hunger eating behaviors.  Orders and follow up as documented in patient record. Follow up at next appointment.  6. Other iron deficiency anemia Check labs today.  - Anemia panel - Vitamin B12 - Folate - Iron and TIBC - Ferritin  7. Vitamin D deficiency Low Vitamin D level contributes to fatigue and are associated with obesity, breast, and colon cancer. He agrees to follow-up for routine testing of Vitamin D, at least 2-3 times per year to avoid over-replacement. Check labs today.  - VITAMIN D 25 Hydroxy (Vit-D Deficiency, Fractures)  8. Vitamin B12 deficiency The diagnosis was reviewed with the patient. Counseling provided today, see below. We will continue to monitor. Orders and follow up as documented in patient record. Check labs today.  Counseling The body needs vitamin B12: to make red blood cells; to make DNA; and to help the nerves work properly so they can carry messages from the brain to the body.  The main causes of vitamin B12 deficiency include dietary deficiency, digestive diseases, pernicious anemia, and having a surgery in which part of the stomach or small intestine is removed.  Certain medicines can make it harder for the  body to absorb vitamin B12. These medicines include: heartburn medications; some antibiotics; some medications used to treat diabetes, gout, and high cholesterol.  In some cases, there are no symptoms of this condition. If the condition leads to anemia or nerve damage, various symptoms can occur, such as weakness or fatigue, shortness of breath, and numbness or tingling in your hands and feet.   Treatment:  May include taking vitamin B12 supplements.  Avoid alcohol.  Eat lots of healthy foods that contain vitamin B12: Beef, pork, chicken, [place], and organ meats, such as liver.  Seafood: This includes clams, rainbow trout, salmon,  tuna, and haddock. Eggs.  Cereal and dairy products that are fortified: This means that vitamin B12 has been added to the food.  - Vitamin B12  9. At risk for deficient knowledge of diabetes mellitus Ricardo Lopez was given approximately 15 minutes of diabetes education and counseling today. We discussed intensive lifestyle modifications today with an emphasis on weight loss as well as increasing exercise and decreasing simple carbohydrates in his diet. We also reviewed medication options with an emphasis on risk versus benefit of those discussed.   10. Class 3 severe obesity with serious comorbidity and body mass index (BMI) greater than or equal to 70 in adult, unspecified obesity type [facility]) Ricardo Lopez is currently in the action stage of change and his goal is to continue with weight loss efforts. I recommend Ricardo Lopez begin the structured treatment plan as follows:  He has agreed to the Category 4 Plan + 200 calories.  Exercise goals: No exercise has been prescribed at this time.   Behavioral modification strategies: increasing lean protein intake, meal planning and cooking strategies, keeping healthy foods in the home and planning for success.  He was informed of the importance of frequent follow-up visits to maximize his success with intensive lifestyle modifications for his multiple health conditions. He was informed we would discuss his lab results at his next visit unless there is a critical issue that needs to be addressed sooner. Ricardo Lopez agreed to keep his next visit at the agreed upon time to discuss these results.  Objective:   Blood pressure 122/66, pulse 83, temperature 98 F (36.7 C), height 5\' 8"  (1.727 m), weight (!) 479 lb (217.3 kg), SpO2 97 %. Body mass index is 72.83 kg/m.  EKG: Normal sinus rhythm, rate 94 on 04/18/2020.  Indirect Calorimeter Unable to obtain.  General: Cooperative, alert, well developed, in no acute distress. HEENT: Conjunctivae and lids  unremarkable. Cardiovascular: Regular rhythm.  Lungs: Normal work of breathing. Neurologic: No focal deficits.   Lab Results  Component Value Date   CREATININE 0.98 05/18/2020   BUN 13 05/18/2020   NA 141 05/18/2020   K 4.4 05/18/2020   CL 99 05/18/2020   CO2 26 05/18/2020   Lab Results  Component Value Date   ALT 25 05/18/2020   AST 17 05/18/2020   ALKPHOS 92 05/18/2020   BILITOT <0.2 05/18/2020   Lab Results  Component Value Date   HGBA1C 6.2 (H) 05/18/2020   HGBA1C 7.1 (H) 10/19/2019   HGBA1C 6.2 (H) 02/28/2015   Lab Results  Component Value Date   INSULIN 24.3 05/18/2020   Lab Results  Component Value Date   TSH 4.680 (H) 05/18/2020   Lab Results  Component Value Date   CHOL 221 (H) 05/18/2020   HDL 53 05/18/2020   LDLCALC 145 (H) 05/18/2020   TRIG 127 05/18/2020   Lab Results  Component Value Date   WBC 8.8  05/18/2020   HGB 10.2 (L) 05/18/2020   HCT 34.9 (L) 05/18/2020   MCV 69 (L) 05/18/2020   PLT 289 12/31/2019   Lab Results  Component Value Date   IRON 58 05/18/2020   TIBC 309 05/18/2020   FERRITIN 125 05/18/2020    Attestation Statements:   Reviewed by clinician on day of visit: allergies, medications, problem list, medical history, surgical history, family history, social history, and previous encounter notes.  Time spent on visit including pre-visit chart review and post-visit charting and care was 60 minutes.   Edmund Hilda, am acting as transcriptionist for Reuben Likes, MD.  This is the patient's first visit at [facility]. The patient's NEW PATIENT PACKET was reviewed at length. Included in the packet: current and past health history, medications, allergies, ROS, gynecologic history (women only), surgical history, family history, social history, weight history, weight loss surgery history (for those that have had weight loss surgery), nutritional evaluation, mood and food questionnaire, PHQ9, Epworth  questionnaire, sleep habits questionnaire, patient life and health improvement goals questionnaire. These will all be scanned into the patient's chart under media.   During the visit, I independently reviewed the patient's EKG, bioimpedance scale results, and indirect calorimeter results. I used this information to tailor a meal plan for the patient that will help him to lose weight and will improve his obesity-related conditions going forward. I performed a medically necessary appropriate examination and/or evaluation. I discussed the assessment and treatment plan with the patient. The patient was provided an opportunity to ask questions and all were answered. The patient agreed with the plan and demonstrated an understanding of the instructions. Labs were ordered at this visit and will be reviewed at the next visit unless more critical results need to be addressed immediately. Clinical information was updated and documented in the EMR.   Time spent on visit including pre-visit chart review and post-visit care was 60 minutes.    I have reviewed the above documentation for accuracy and completeness, and I agree with the above. - Katherina Mires, MD

## 2020-05-23 NOTE — Progress Notes (Deleted)
Cardiology Clinic Note   Patient Name: Ricardo Lopez. Date of Encounter: 05/23/2020  Primary Care Provider:  [name], MD Primary Cardiologist:  Rollene Rotunda, MD  Patient Profile    ***  Past Medical History    Past Medical History:  Diagnosis Date  . Anemia   . Anxiety and depression   . B12 deficiency   . Back pain   . COVID    9/21- hypoxia requiring oxygen  . Diabetes mellitus without complication (HCC)   . Fluid retention   . Frequency of urination   . GERD (gastroesophageal reflux disease)   . Gluteal abscess    ecoli after covid hospitalization 2021  . Headache   . Hypertension   . Hypoxia   . Joint pain   . Muscle pain   . Nervousness   . Obesity   . Other fatigue   . Sleep apnea   . SOB (shortness of breath) on exertion   . Stomach ulcer   . Tachycardia   . Thoracic aortic aneurysm (HCC)   . Weakness    Past Surgical History:  Procedure Laterality Date  . None      Allergies  No Known Allergies  History of Present Illness    ***  Home Medications    Prior to Admission medications   Medication Sig Start Date End Date Taking? Authorizing Provider  acetaminophen (TYLENOL) 325 MG tablet Take 650 mg by mouth every 6 (six) hours as needed.    [provider]  albuterol (VENTOLIN HFA) 108 (90 Base) MCG/ACT inhaler Inhale 2 puffs into the lungs every 4 (four) hours as needed for wheezing or shortness of breath. 10/22/19   Shon Hale, MD  ascorbic acid (VITAMIN C) 500 MG tablet Take 1 tablet (500 mg total) by mouth daily. 10/23/19   Shon Hale, MD  buPROPion (WELLBUTRIN XL) 150 MG 24 hr tablet Take 1 tablet (150 mg total) by mouth daily. 05/04/20   [name], MD  Dulaglutide (TRULICITY) 0.75 MG/0.5ML SOPN Inject 0.75 mg into the skin. 05/13/20   [provider]  escitalopram (LEXAPRO) 20 MG tablet Take 1 tablet (20 mg total) by mouth daily. 05/04/20   [name], MD  FEROSUL 325 (65 Fe) MG  tablet TAKE 1 TABLET BY MOUTH EVERY MORNING 03/20/20   [name], MD  furosemide (LASIX) 80 MG tablet Take 1 tablet (80 mg total) by mouth 2 (two) times daily. 04/18/20 07/17/20  Rollene Rotunda, MD  HYDROcodone-acetaminophen (NORCO) 5-325 MG tablet Take 1 tablet by mouth every 6 (six) hours as needed for moderate pain. 05/16/20   [name], MD  metoprolol succinate (TOPROL-XL) 100 MG 24 hr tablet Take 1 tablet (100 mg total) by mouth daily. 02/21/20   Ronney Asters, NP  Multiple Vitamin (MULTIVITAMIN) tablet Take 1 tablet by mouth daily.    [provider]  potassium chloride SA (KLOR-CON) 20 MEQ tablet Take 2 tablets (40 mEq total) by mouth daily. 04/18/20 07/17/20  Rollene Rotunda, MD  zolpidem (AMBIEN) 10 MG tablet TAKE 1 TABLET(10 MG) BY MOUTH AT BEDTIME AS NEEDED FOR SLEEP 05/01/20   [name], MD    Family History    Family History  Problem Relation Age of Onset  . Diabetes Mother   . Hypertension Mother   . Hyperlipidemia Mother   . Thyroid disease Mother   . Diabetes Father   . Hyperlipidemia Father   . Hypertension Father   .  Depression Father   . Anxiety disorder Father   . Sleep apnea Father    He indicated that his mother is alive. He indicated that his father is alive.  Social History    Social History   Socioeconomic History  . Marital status: Married    Spouse name: Not on file  . Number of children: Not on file  . Years of education: Not on file  . Highest education level: Not on file  Occupational History  . Occupation: stay at home spouse  Tobacco Use  . Smoking status: Never Smoker  . Smokeless tobacco: Never Used  Vaping Use  . Vaping Use: Never used  Substance and Sexual Activity  . Alcohol use: Yes    Comment: occasional  . Drug use: No  . Sexual activity: Yes  Other Topics Concern  . Not on file  Social History Narrative   Lives at home with wife and two children.  Four children in all.     Social Determinants of  Health   Financial Resource Strain: Not on file  Food Insecurity: Not on file  Transportation Needs: Not on file  Physical Activity: Not on file  Stress: Not on file  Social Connections: Not on file  Intimate Partner Violence: Not on file     Review of Systems    General:  No chills, fever, night sweats or weight changes.  Cardiovascular:  No chest pain, dyspnea on exertion, edema, orthopnea, palpitations, paroxysmal nocturnal dyspnea. Dermatological: No rash, lesions/masses Respiratory: No cough, dyspnea Urologic: No hematuria, dysuria Abdominal:   No nausea, vomiting, diarrhea, bright red blood per rectum, melena, or hematemesis Neurologic:  No visual changes, wkns, changes in mental status. All other systems reviewed and are otherwise negative except as noted above.  Physical Exam    VS:  There were no vitals taken for this visit. , BMI There is no height or weight on file to calculate BMI. GEN: Well nourished, well developed, in no acute distress. HEENT: normal. Neck: Supple, no JVD, carotid bruits, or masses. Cardiac: RRR, no murmurs, rubs, or gallops. No clubbing, cyanosis, edema.  Radials/DP/PT 2+ and equal bilaterally.  Respiratory:  Respirations regular and unlabored, clear to auscultation bilaterally. GI: Soft, nontender, nondistended, BS + x 4. MS: no deformity or atrophy. Skin: warm and dry, no rash. Neuro:  Strength and sensation are intact. Psych: Normal affect.  Accessory Clinical Findings    Recent Labs: 11/02/2019: Magnesium 2.0 12/31/2019: Platelets 289 01/28/2020: BNP <2.5 05/18/2020: ALT 25; BUN 13; Creatinine, Ser 0.98; Hemoglobin 10.2; Potassium 4.4; Sodium 141; TSH 4.680   Recent Lipid Panel    Component Value Date/Time   CHOL 221 (H) 05/18/2020 1357   TRIG 127 05/18/2020 1357   HDL 53 05/18/2020 1357   LDLCALC 145 (H) 05/18/2020 1357    ECG personally reviewed by me today- *** - No acute changes  Assessment & Plan   1.  ***   Thomasene Ripple.  Jacques Fife NP-C    05/23/2020, 9:31 AM [facility] [address] Office [phone] Fax [phone]  Notice: This dictation was prepared with Dragon dictation along with smaller phrase technology. Any transcriptional errors that result from this process are unintentional and may not be corrected upon review.  I spent***minutes examining this patient, reviewing medications, and using patient centered shared decision making involving her cardiac care.  Prior to her visit I spent greater than 20 minutes reviewing her past medical history,  medications, and prior  cardiac tests.

## 2020-05-25 ENCOUNTER — Ambulatory Visit: Payer: 59 | Admitting: General Practice

## 2020-06-01 ENCOUNTER — Ambulatory Visit (INDEPENDENT_AMBULATORY_CARE_PROVIDER_SITE_OTHER): Payer: 59 | Admitting: Family Medicine

## 2020-06-02 ENCOUNTER — Telehealth (INDEPENDENT_AMBULATORY_CARE_PROVIDER_SITE_OTHER): Payer: 59 | Admitting: Nurse Practitioner

## 2020-06-02 ENCOUNTER — Other Ambulatory Visit: Payer: Self-pay

## 2020-06-02 ENCOUNTER — Encounter: Payer: Self-pay | Admitting: Nurse Practitioner

## 2020-06-02 DIAGNOSIS — J069 Acute upper respiratory infection, unspecified: Secondary | ICD-10-CM

## 2020-06-02 DIAGNOSIS — R0982 Postnasal drip: Secondary | ICD-10-CM

## 2020-06-02 MED ORDER — FLUTICASONE PROPIONATE 50 MCG/ACT NA SUSP: 2.0000 | Freq: Every day | NASAL | 6 refills | Status: DC

## 2020-06-02 MED ORDER — BENZONATATE 100 MG PO CAPS: 100.0000 mg | ORAL_CAPSULE | Freq: Two times a day (BID) | ORAL | 0 refills | Status: DC | PRN

## 2020-06-02 MED ORDER — GUAIFENESIN ER 600 MG PO TB12
600.0000 mg | ORAL_TABLET | Freq: Two times a day (BID) | ORAL | 0 refills | Status: DC | PRN
Start: 2020-06-02 — End: 2020-06-12

## 2020-06-02 MED ORDER — CETIRIZINE HCL 10 MG PO TABS: 10.0000 mg | ORAL_TABLET | Freq: Every day | ORAL | 2 refills | Status: DC

## 2020-06-02 MED ORDER — SALINE SPRAY 0.65 % NA SOLN: 1.0000 | NASAL | 0 refills | Status: AC | PRN

## 2020-06-02 NOTE — Progress Notes (Signed)
Subjective:    Patient ID: Ricardo Park., male    DOB: 1975-04-04, 45 y.o.   MRN: [id number]  HPI: Ricardo Noga. is a 45 y.o. male presenting virtually for cough and congestion.  Chief Complaint  Patient presents with  . Cough   UPPER RESPIRATORY TRACT INFECTION Onset: Tuesday - 3 days ago COVID vaccination status: not vaccinated COVID testing history: not tested; grandchild negative for COVID and flu Fever: no Cough: yes; productive Pleurisy: yes Shortness of breath: no Wheezing: yes; worse when laying flat Chest pain: yes, with cough Chest tightness: yes; with coughing Chest congestion: yes Nasal congestion: yes Runny nose: yes Post nasal drip: yes Sneezing: yes Sore throat: yes Swollen glands: no Sinus pressure: yes; maxillary Headache: no Face pain: no Toothache: yes Ear pain: no  Ear pressure: no  Eyes red/itching:yes Eye drainage/crusting: no  Nausea: no  Vomiting: yes; took medicine without food on stomach Diarrhea: no  Change in appetite: yes; decreased  Loss of taste/smell: no  Rash: no Fatigue: no; always tired so not new Sick contacts: yes; watching grandchild  Strep contacts: no  Context: stable Recurrent sinusitis: no Treatments attempted: Nyquil/Dayquil Relief with OTC medications: no  No Known Allergies  Outpatient Encounter Medications as of 06/02/2020  Medication Sig  . benzonatate (TESSALON) 100 MG capsule Take 1 capsule (100 mg total) by mouth 2 (two) times daily as needed for cough. Take first dose at night time and monitor for drowsiness.  Do not take at the same time as other medications that can make you drowsy.  If this medicine makes you drowsy, do not take while driving or operating heavy machinery.  . cetirizine (ZYRTEC) 10 MG tablet Take 1 tablet (10 mg total) by mouth daily.  . fluticasone (FLONASE) 50 MCG/ACT nasal spray Place 2 sprays into both nostrils daily.  Marland Kitchen guaiFENesin (MUCINEX) 600 MG 12 hr tablet Take 1  tablet (600 mg total) by mouth 2 (two) times daily as needed for cough or to loosen phlegm.  . sodium chloride (OCEAN) 0.65 % SOLN nasal spray Place 1 spray into both nostrils as needed for congestion.  Marland Kitchen acetaminophen (TYLENOL) 325 MG tablet Take 650 mg by mouth every 6 (six) hours as needed.  Marland Kitchen albuterol (VENTOLIN HFA) 108 (90 Base) MCG/ACT inhaler Inhale 2 puffs into the lungs every 4 (four) hours as needed for wheezing or shortness of breath.  Marland Kitchen ascorbic acid (VITAMIN C) 500 MG tablet Take 1 tablet (500 mg total) by mouth daily.  Marland Kitchen buPROPion (WELLBUTRIN XL) 150 MG 24 hr tablet Take 1 tablet (150 mg total) by mouth daily.  . Dulaglutide (TRULICITY) 0.75 MG/0.5ML SOPN Inject 0.75 mg into the skin.  Marland Kitchen escitalopram (LEXAPRO) 20 MG tablet Take 1 tablet (20 mg total) by mouth daily.  . FEROSUL 325 (65 Fe) MG tablet TAKE 1 TABLET BY MOUTH EVERY MORNING  . furosemide (LASIX) 80 MG tablet Take 1 tablet (80 mg total) by mouth 2 (two) times daily.  Marland Kitchen HYDROcodone-acetaminophen (NORCO) 5-325 MG tablet Take 1 tablet by mouth every 6 (six) hours as needed for moderate pain.  . metoprolol succinate (TOPROL-XL) 100 MG 24 hr tablet Take 1 tablet (100 mg total) by mouth daily.  . Multiple Vitamin (MULTIVITAMIN) tablet Take 1 tablet by mouth daily.  . potassium chloride SA (KLOR-CON) 20 MEQ tablet Take 2 tablets (40 mEq total) by mouth daily.  Marland Kitchen zolpidem (AMBIEN) 10 MG tablet TAKE 1 TABLET(10 MG) BY MOUTH AT BEDTIME  AS NEEDED FOR SLEEP   No facility-administered encounter medications on file as of 06/02/2020.    Patient Active Problem List   Diagnosis Date Noted  . Hypoxia 04/17/2020  . Sleep apnea 04/17/2020  . Tachycardia 04/17/2020  . Thoracic aortic aneurysm (HCC)   . COVID   . Gluteal abscess   . Sepsis due to undetermined organism (HCC) 10/31/2019  . Chronic respiratory failure with hypoxia (HCC) 10/31/2019  . Abscess and cellulitis of gluteal region 10/30/2019  . Acute respiratory disease due to  COVID-19 virus 10/22/2019  . E coli bacteremia 10/22/2019  . Hyperglycemia 10/19/2019  . Hypoalbuminemia 10/19/2019  . Pneumonia due to COVID-19 virus 09/29/2019  . Acute respiratory failure with hypoxia due to Covid  09/29/2019  . Transaminitis 09/29/2019  . AKI (acute kidney injury) (HCC) 09/29/2019  . Microcytic anemia 09/29/2019  . Chronic pain syndrome 10/15/2012  . Osteochondral defect of ankle 05/26/2012  . Ankle arthritis 05/26/2012  . Right knee pain 05/26/2012  . Medial meniscus, posterior horn derangement 05/26/2012  . Severe obesity (BMI >= 40) (HCC)   . Hypertension     Past Medical History:  Diagnosis Date  . Anemia   . Anxiety and depression   . B12 deficiency   . Back pain   . COVID    9/21- hypoxia requiring oxygen  . Diabetes mellitus without complication (HCC)   . Fluid retention   . Frequency of urination   . GERD (gastroesophageal reflux disease)   . Gluteal abscess    ecoli after covid hospitalization 2021  . Headache   . Hypertension   . Hypoxia   . Joint pain   . Muscle pain   . Nervousness   . Obesity   . Other fatigue   . Sleep apnea   . SOB (shortness of breath) on exertion   . Stomach ulcer   . Tachycardia   . Thoracic aortic aneurysm (HCC)   . Weakness     Relevant past medical, surgical, family and social history reviewed and updated as indicated. Interim medical history since our last visit reviewed.  Review of Systems Per HPI unless specifically indicated above     Objective:    There were no vitals taken for this visit.  Wt Readings from Last 3 Encounters:  05/18/20 (!) 479 lb (217.3 kg)  05/04/20 (!) 486 lb (220.4 kg)  04/18/20 (!) 501 lb 9.6 oz (227.5 kg)    Physical Exam Vitals and nursing note reviewed.  Constitutional:      General: He is not in acute distress.    Appearance: Normal appearance. He is obese. He is not toxic-appearing.  HENT:     Head: Normocephalic and atraumatic.     Right Ear: External ear  normal.     Left Ear: External ear normal.     Nose: Nose normal. No congestion.     Mouth/Throat:     Mouth: Mucous membranes are moist.     Pharynx: Oropharynx is clear.  Eyes:     General: No scleral icterus.       Right eye: No discharge.        Left eye: No discharge.  Cardiovascular:     Comments: Unable to assess heart sounds via virtual visit Pulmonary:     Effort: Pulmonary effort is normal. No respiratory distress.     Comments: Unable to assess lung sounds via virtual visit; patient talking in complete sentences during telemedicine visit without accessory muscle use.  Breathing with  mouth closed. Skin:    Coloration: Skin is not jaundiced or pale.  Neurological:     Mental Status: He is alert and oriented to person, place, and time.  Psychiatric:        Mood and Affect: Mood normal.        Behavior: Behavior normal.        Thought Content: Thought content normal.        Judgment: Judgment normal.       Assessment & Plan:  1. Upper respiratory tract infection, unspecified type Acute.  Symptoms started 05/30/2020.  Not vaccinated for COVID-19, had COVID-19 pneumonia in September 2021.  Given history and exam, likely viral at this point.  Reassured patient that symptoms and exam findings are most consistent with a viral upper respiratory infection and explained lack of efficacy of antibiotics against viruses.  Discussed expected course and features suggestive of secondary bacterial infection.  Continue supportive care. Increase fluid intake with water or electrolyte solution like pedialyte. Encouraged acetaminophen as needed for fever/pain. Encouraged salt water gargling, chloraseptic spray and throat lozenges. Encouraged OTC guaifenesin. Encouraged saline sinus flushes and/or neti with humidified air.  Continue albuterol inhaler as needed every 4-6 hours for wheezing or shortness of breath. With any sudden onset new chest pain, dizziness, sweating, or shortness of breath, go to  ED.  If symptoms not improving near   - guaiFENesin (MUCINEX) 600 MG 12 hr tablet; Take 1 tablet (600 mg total) by mouth 2 (two) times daily as needed for cough or to loosen phlegm.  Dispense: 30 tablet; Refill: 0 - sodium chloride (OCEAN) 0.65 % SOLN nasal spray; Place 1 spray into both nostrils as needed for congestion.  Dispense: 88 mL; Refill: 0 - benzonatate (TESSALON) 100 MG capsule; Take 1 capsule (100 mg total) by mouth 2 (two) times daily as needed for cough. Take first dose at night time and monitor for drowsiness.  Do not take at the same time as other medications that can make you drowsy.  If this medicine makes you drowsy, do not take while driving or operating heavy machinery.  Dispense: 20 capsule; Refill: 0  2. Post-nasal drip Acute.  Question if allergic component to symptoms.  Encouraged to start oral antihistamine and daily nasal corticosteroid daily.    - cetirizine (ZYRTEC) 10 MG tablet; Take 1 tablet (10 mg total) by mouth daily.  Dispense: 30 tablet; Refill: 2 - fluticasone (FLONASE) 50 MCG/ACT nasal spray; Place 2 sprays into both nostrils daily.  Dispense: 16 g; Refill: 6    Follow up plan: Return if symptoms worsen or fail to improve.   Due to the catastrophic nature of the COVID-19 pandemic, this video visit was completed soley via audio and visual contact via [facility] due to the restrictions of the COVID-19 pandemic.  All issues as above were discussed and addressed. Physical exam was done as above through visual confirmation on [employer]. If it was felt that the patient should be evaluated in the office, they were directed there. The patient verbally consented to this visit. . Location of the patient: home . Location of the provider: work . Those involved with this call:  . Provider: Cathlean Marseilles, DNP, FNP-C . CMA: Moises Blood, CMA . Front Desk/Registration: Claudine Mouton  . Time spent on call: 11 minutes with patient face to face via video conference.  More than 50% of this time was spent in counseling and coordination of care. 20 minutes total spent in review of patient's record and  preparation of their chart.  I verified patient identity using two factors (patient name and date of birth). Patient consents verbally to being seen via telemedicine visit today.

## 2020-06-06 ENCOUNTER — Ambulatory Visit: Payer: 59 | Admitting: Family Medicine

## 2020-06-06 ENCOUNTER — Encounter: Payer: Self-pay | Admitting: Family Medicine

## 2020-06-06 ENCOUNTER — Ambulatory Visit (HOSPITAL_COMMUNITY)
Admission: RE | Admit: 2020-06-06 | Discharge: 2020-06-06 | Disposition: A | Discharge status: Home / Self Care | Payer: 59 | Source: Ambulatory Visit | Attending: Family Medicine | Admitting: Family Medicine

## 2020-06-06 ENCOUNTER — Other Ambulatory Visit: Payer: Self-pay

## 2020-06-06 VITALS — BP 148/82 | HR 100 | Temp 99.6°F | Resp 24 | Ht 68.0 in | Wt >= 6400 oz

## 2020-06-06 DIAGNOSIS — U099 Post covid-19 condition, unspecified: Secondary | ICD-10-CM | POA: Diagnosis not present

## 2020-06-06 DIAGNOSIS — J159 Unspecified bacterial pneumonia: Secondary | ICD-10-CM | POA: Diagnosis not present

## 2020-06-06 DIAGNOSIS — R0902 Hypoxemia: Secondary | ICD-10-CM | POA: Diagnosis not present

## 2020-06-06 DIAGNOSIS — R0602 Shortness of breath: Secondary | ICD-10-CM

## 2020-06-06 DIAGNOSIS — F321 Major depressive disorder, single episode, moderate: Secondary | ICD-10-CM | POA: Diagnosis not present

## 2020-06-06 MED ORDER — TRULICITY 1.5 MG/0.5ML ~~LOC~~ SOAJ: 1.5000 mg | SUBCUTANEOUS | 3 refills | Status: DC

## 2020-06-06 MED ORDER — LEVOFLOXACIN 500 MG PO TABS: 500.0000 mg | ORAL_TABLET | Freq: Every day | ORAL | 0 refills | Status: DC

## 2020-06-06 MED ORDER — PREDNISONE 20 MG PO TABS: ORAL_TABLET | ORAL | 0 refills | Status: DC

## 2020-06-06 NOTE — Progress Notes (Signed)
Subjective:    Patient ID: Ricardo Lopez., male    DOB: 01-02-1976, 45 y.o.   MRN: [id number]  Anxiety    Sinus Problem  Illness   Patient was admitted in September for COVID pneumonia.  Subsequently readmitted for E.coli bacteremia ultimately determined to be due to a perirectal infection.  See most recent discharge summary: Admit date: 10/30/2019 Discharge date: 11/03/2019  Admitted From: Home Disposition: Home  Recommendations for Outpatient Follow-up:  1. Follow up with PCP in 1-2 weeks 2. Please obtain BMP/CBC in one week 3. Follow-up with general surgery as needed 4. He has been referred to outpatient physical therapy for wound care  Home Health: Home health RN Equipment/Devices:  Discharge Condition: Stable CODE STATUS: Full code Diet recommendation: Heart healthy  Brief/Interim Summary: 45 year old male with a history of hypertension, morbid obesity, diabetes mellitus type 2 presenting with increasing pain and drainage from his buttocks. The patient was recently admitted to the hospital from 10/19/2019 to 10/22/2019 secondary to respiratory failure as well as E. coli bacteremia. The patient was discharged home with cefdinir which he finished on 10/30/2019. It was felt that his respiratory failure was likely secondary to complications from his COVID-19 pneumonia in the setting of morbid obesity and OHS. He was discharged home on 2 L nasal cannula. In addition, the patient was also recently discharged after a stay from 09/29/2019 to 10/07/2019 after treatment for a COVID-19 pneumonia. The patient states that he had some gluteal pain even before he left the hospital on 10/22/2019. However, he has noted increasing edema and erythema and pain for the last 3 to 4 days. He is also noted some purulent drainage. He denied any fevers, chills, chest pain, shortness breath, cough, hemoptysis, vomiting, diarrhea, abdominal pain. In the emergency department, the patient had  low-grade temperature of 9 9.1 F. He was mildly tachycardic up to 110. He was hemodynamically stable. Oxygen saturation was 97% on room air. The patient was started on vancomycin and Zosyn. General surgery was consulted to assist with management.  Discharge Diagnoses:  Principal Problem:   Abscess and cellulitis of gluteal region Active Problems:   Severe obesity (BMI >= 40) (HCC)   Acute respiratory failure with hypoxia due to Covid    Microcytic anemia   Sepsis due to undetermined organism (HCC)   Chronic respiratory failure with hypoxia (HCC)  Sepsis -Present on admission -Secondary to gluteal abscess -Presented with tachycardia and WBC up to 16.9 -Lactic acid 1.6 -Follow cultures--VRE+Ecoli -Continue vancomycin and Zosyn  Gluteal abscess -Appreciate surgical consult -Case discussed with Dr. Jorja Loa suspicion of fistula at this time-->treat as gluteal abscess with outpatient follow-up and consideration for MRI as an outpatient if clinically worsens or no improvement -Continue vancomycin and Zosyn -WBC decreasing 16.9>>14.0>>10.8 -NON-surgical setting cultures = VRE and Ecoli -Doubt VRE is true pathogen as patient is improving and WBC improving without active tx against VRE -Treated with vancomycin/Zosyn -Since he is clinically improved, antibiotics transition to doxycycline/Augmentin -He has been referred for outpatient wound care with physical therapy -will need HHRN to help with dressing changes  Controlled diabetes mellitus type 2 -10/19/2019 hemoglobin A1c 7.1 -NovoLog sliding scale  Morbid obesity -BMI 60.82 -Lifestyle modification  Chronic respiratory failure with hypoxia -Stable on 2 L nasal cannula -Encourage incentive spirometry  Iron deficiency anemia -10/20/2019 iron saturation 7% -Ferritin 289 -start iron once infx resolved, as an outpatient  11/09/19 Patient is here today for follow-up.  He has seen wound care every week.  They have  appointment to see him tomorrow.  He states that the wound is improving on his gluteal area and that the pain is getting better.  He denies any fever.  He denies any chills.  He denies any nausea or vomiting.  He does have significant pain in the gluteal area especially when he sits which is primarily the majority of the day.  He is still on oxygen 2 L via nasal cannula.  He is 97% on 2 L but if he takes his oxygen off it drops to 89% even at rest.  He denies any chest pain however he does have shortness of breath with minimal activity.  He also continues to have a nonproductive cough.  Fortunately his lungs sound relatively clear on examination today.  We reviewed his lab work and we discussed his diabetes diagnosis that he was unaware of.  We discussed dietary changes.  We also mentioned that he is severely anemic and that his iron level was low.  I recommended starting iron sulfate 324 mg daily.  At the present time he is not taking any iron supplement.  He denies any pleurisy he does have fibrotic changes in both legs consistent with chronic lymphedema.  He does have edema in both legs to the knees but there is no evidence of a DVT or PE on his exam today.  At that time, my plan was: Discontinue tramadol and replace with Norco 5/325 1-2 p.o. every 6 hours as needed pain.  He can also use Tussionex 1 teaspoon every 12 hours as needed for cough.  Clinically his cellulitis is improving the gluteal region.  He has no signs of systemic illness or bacteremia such as fever, etc.  I will repeat a blood culture today and also check a CBC to monitor for any leukocytosis.  Complete Augmentin and doxycycline as prescribed.  Patient is seeing wound clinic for wound checks and he has an appointment tomorrow.  Repeat chest x-ray in 2 weeks and see the patient back in 2 weeks to reassess weaning off oxygen.  At the present time he is not ready to do so.  At that point I will give the patient a flu shot.  Would recommend a  Covid vaccine 90 days after his infection.  Discussed diabetes and recommended a low carbohydrate diet.  11/23/19 Patient is here today for a checkup.  His oxygen is 93% on room air.  However he states that when he is up walking around, it will drop into the mid 80s.  He does report some pleurisy with deep inspiration and he still has a resting tachycardia of 104 to 108 bpm.  He denies any hemoptysis or purulent sputum.  His cough is getting better.  Chest x-ray looks considerably better compared to the chest x-ray from September 28 although it does have some residual opacities which may reflect scar tissue.  He is due to recheck his CBC to monitor his anemia.  He is on iron tablet.  He reports that he is not sleeping.  He states that he can only get about 2 hours of sleep to 3 hours of sleep and then he is wide-awake.  There is no particular symptom that is keeping him awake is just that he is unable to rest.  He also continues to have pain around the the healing wound on his right gluteus.  I examined the wound today with his wife.  She is doing an excellent job taking care of the wound.  There is a circular patch of granulation tissue roughly 5 cm x 3 cm.  There is no ulcer or cavity.  It is filled in almost completely.  It is still tender to the touch.  There is no surrounding erythema or warmth to suggest cellulitis.  There is no palpable fluctuance.  The wound appears to be healing well.  He is still seeing wound care.  At that time, my plan was: Given his tachycardia and pleurisy and shortness of breath I will check a D-dimer to evaluate for possible pulmonary embolism given his recent Covid.  If D-dimer is positive I would recommend a CT angiogram of the chest.  Recheck CBC to monitor his anemia.  Also recheck a CMP to monitor his glucose and kidney function.  However I am pleased by his breath sounds today and the improvement on his chest x-ray.  I hope that over the next 3 to 4 weeks he will be able to  wean off oxygen completely.  I will give him Ambien 10 mg p.o. nightly as needed insomnia but I encouraged him to try to wean away from the pain medication and not to take the pain medication at the same time as the Ambien.  Also gave the patient compression hose prescription for knee-high compression hose 15 to 20 mmHg to help with the lymphedema in his legs.  As a side note, the patient reports numbness and tingling in both hands distal to the wrist with pain in his fingers.  I suspect carpal tunnel syndrome.  We discussed nerve conduction studies but we will defer that at the present time.  12/21/19 Patient has rapidly gained weight.  His weight is up over 50 pounds since I last saw him.  His legs are swollen and tight and tender to palpation.  He also reports shortness of breath with activity.  He had a positive D-dimer at his last visit so we did obtain a CT scan but no pulmonary embolism was found only residual inflammation in the lungs from his Covid pneumonia.  However I am concerned about right-sided heart failure due to this as well as due to his morbid obesity.  I believe the patient likely has diastolic heart failure even though he had a normal echocardiogram in September 29.  He denies any chest pain.  He does have some orthopnea.  He also reports depression and anxiety.  He has having trouble sleeping.  He reports panic attacks.  He states that if his wife leaves him alone he panics.  She states that if he wakes up and she is not in the home he will panic.  At that time, my plan was: Patient clearly is fluid overloaded.  I believe the patient has acute diastolic heart failure or right-sided heart failure due to his underlying lung damage.  Begin Lasix 40 mg twice daily along with K-Dur 20 milliequivalents daily.  Check BNP, CBC, CMP.  Reassess weight and renal function in 1 week and titrate diuretics to achieve the desired effect.  Meanwhile start Lexapro for GAD 10 mg a day.  12/31/19 Patient  has diuresed and lost over 20 pounds of fluid.  The swelling in his legs is better although it is still very prominent.  His breathing is slightly better.  He states however if he sits without oxygen his oxygen will drop to 90% on room air.  He denies any cramping.  He denies any dizziness.  He denies any lightheadedness however he does continue to have  dyspnea on exertion and easy fatigability.  He continues to have severe pain in his ankles and in his legs with ambulation.  He is using hydrocodone 1-2 times a day.  The Lexapro was not helping with panic attacks however he is only been on the medication for about 10 days.  I explained to him that this is to be expected and that we need to be patient.  At that time, my plan was: I believe the patient has diastolic heart failure.  He may also have right-sided heart failure.  I believe is due to a combination of morbid obesity, scarring in the lungs due to Covid, untreated obstructive sleep apnea.  Therefore continue Lasix 40 mg twice daily.  Check a CMP to monitor electrolytes to determine if we need to increase potassium supplementation or decrease diuretic.  I would like to see the patient lose an additional 20 pounds of fluid.  Therefore I would like to recheck via telephone in 1 week assuming his electrolytes are normal.  However I believe part of his issue could be his tachycardia.  The tachycardia decreases filling time for the heart and if he does have a stiff heart consistent with diastolic dysfunction this only exacerbates his decreased cardiac output.  Therefore I will start the patient on metoprolol XL 25 mg a day in an effort to try to slow his tachycardia and improve filling time to increase cardiac output.  I will also consult cardiology for any additional recommendations to manage diastolic dysfunction.  I did refill his hydrocodone 5/325 1 tablet twice daily 60 tablets/month.  Reassess the patient via telephone in 1 week.  Try to achieve an  additional 20 pounds of diuresis assuming electrolytes are normal  05/08/20 Patient has gained over 50 pounds since I last saw him!.  While some of this is certainly swelling I believe most of this is likely obesity.  He admits to overeating.  He admits that he has emotional issues and that "I have a problem".  He is clearly depressed.  He reports daily anxiety.  He reports anhedonia.  He reports depression.  Lexapro is not helping.  He is also asking to increase his painkillers.  He is taking 2 a day for 78-month total.  He is extremely sedentary.  I declined.  I stated that I feel that that would lead to a problem.  Furthermore, I feel that this is pointing to habituation and dependency.  I was clear with the patient.  I stated that we need to make serious changes or else his life expectancy is going to be drastically reduced.  We need to increase his activity, change his dietary habits, achieve significant weight loss.  All of this is necessary in order to prolong his life and improve his quality of life.  He agrees.  He has an appointment to see weight management coming up.  Cardiology just increased his fluid pills.  He does report symptoms of a sinus infection.  He also reports pain in his right gluteus.  It is posterior to the greater trochanter.  That area is not indurated or firm or red.  There is no palpable abnormality.  There is no palpable abscess.  There is no visible deformity.  Is been tender in that area for approximately a week.  Range of motion in the hip does not seem to exacerbate it.  Differential diagnosis includes musculoskeletal pain.  At that time, my plan was: First, I will try to address his depression.  We increase Lexapro to 20 mg a day and added Wellbutrin extended release 150 mg p.o. every morning as an adjunctive therapy.  Reassess in 4 weeks.  Second I declined to increase his hydrocodone due to concerns over habituation and dependency.  Third we need to address his morbid  obesity.  I certainly support him seeing weight management.  We also discussed options and I gave him samples of Trulicity 0.75 mg subcu weekly.  In 2 weeks if tolerating, he will increase to 1.5 mg subcu weekly thereafter.  Also explained to the patient that he needs to make drastic lifestyle changes in order to prevent early mortality and reduce his morbidity.  Therefore I have strongly encouraged exercise, weight loss, and dietary changes.  Obtain chest x-ray as he continues to report pleurisy and shortness of breath however his pulmonary exam today is excellent and I feel the majority of the shortness of breath at this point is due to morbid obesity and deconditioning.  06/06/20 Patient is appointment today was supposed to be a follow-up.  However, he is having worsening cough and breathing issues.  He saw my partner earlier this week and was diagnosed with possible allergies.  However, the patient has a low-grade temperature today of 99.6.  His oxygen was 86% on 2 L when he walked in.  He was tachycardic and short of breath.  On exam today, he has right basilar crackles and diffuse expiratory wheezing all throughout his lungs.  I believe that he is developing community-acquired pneumonia in the right lung and reactive airway disease causing shortness of breath.  On a positive note, he has lost almost 20 pounds since starting Trulicity.  I congratulated him on this.  He also feels some better since starting Wellbutrin.  However today we focused mainly on with his breathing due to the fevers and shortness of breath Past Medical History:  Diagnosis Date  . Anemia   . Anxiety and depression   . B12 deficiency   . Back pain   . COVID    9/21- hypoxia requiring oxygen  . Diabetes mellitus without complication (HCC)   . Fluid retention   . Frequency of urination   . GERD (gastroesophageal reflux disease)   . Gluteal abscess    ecoli after covid hospitalization 2021  . Headache   . Hypertension   .  Hypoxia   . Joint pain   . Muscle pain   . Nervousness   . Obesity   . Other fatigue   . Sleep apnea   . SOB (shortness of breath) on exertion   . Stomach ulcer   . Tachycardia   . Thoracic aortic aneurysm (HCC)   . Weakness    Past Surgical History:  Procedure Laterality Date  . None     Current Outpatient Medications on File Prior to Visit  Medication Sig Dispense Refill  . acetaminophen (TYLENOL) 325 MG tablet Take 650 mg by mouth every 6 (six) hours as needed.    Marland Kitchen. albuterol (VENTOLIN HFA) 108 (90 Base) MCG/ACT inhaler Inhale 2 puffs into the lungs every 4 (four) hours as needed for wheezing or shortness of breath. 18 g 2  . ascorbic acid (VITAMIN C) 500 MG tablet Take 1 tablet (500 mg total) by mouth daily. 30 tablet 1  . benzonatate (TESSALON) 100 MG capsule Take 1 capsule (100 mg total) by mouth 2 (two) times daily as needed for cough. Take first dose at night time and monitor for drowsiness.  Do  not take at the same time as other medications that can make you drowsy.  If this medicine makes you drowsy, do not take while driving or operating heavy machinery. 20 capsule 0  . buPROPion (WELLBUTRIN XL) 150 MG 24 hr tablet Take 1 tablet (150 mg total) by mouth daily. 30 tablet 5  . cetirizine (ZYRTEC) 10 MG tablet Take 1 tablet (10 mg total) by mouth daily. 30 tablet 2  . escitalopram (LEXAPRO) 20 MG tablet Take 1 tablet (20 mg total) by mouth daily. 30 tablet 5  . FEROSUL 325 (65 Fe) MG tablet TAKE 1 TABLET BY MOUTH EVERY MORNING 30 tablet 3  . fluticasone (FLONASE) 50 MCG/ACT nasal spray Place 2 sprays into both nostrils daily. 16 g 6  . furosemide (LASIX) 80 MG tablet Take 1 tablet (80 mg total) by mouth 2 (two) times daily. 180 tablet 3  . guaiFENesin (MUCINEX) 600 MG 12 hr tablet Take 1 tablet (600 mg total) by mouth 2 (two) times daily as needed for cough or to loosen phlegm. 30 tablet 0  . HYDROcodone-acetaminophen (NORCO) 5-325 MG tablet Take 1 tablet by mouth every 6 (six)  hours as needed for moderate pain. 60 tablet 0  . metoprolol succinate (TOPROL-XL) 100 MG 24 hr tablet Take 1 tablet (100 mg total) by mouth daily. 30 tablet 6  . Multiple Vitamin (MULTIVITAMIN) tablet Take 1 tablet by mouth daily.    . potassium chloride SA (KLOR-CON) 20 MEQ tablet Take 2 tablets (40 mEq total) by mouth daily. 180 tablet 3  . sodium chloride (OCEAN) 0.65 % SOLN nasal spray Place 1 spray into both nostrils as needed for congestion. 88 mL 0  . zolpidem (AMBIEN) 10 MG tablet TAKE 1 TABLET(10 MG) BY MOUTH AT BEDTIME AS NEEDED FOR SLEEP 30 tablet 3   No current facility-administered medications on file prior to visit.   No Known Allergies Social History   Socioeconomic History  . Marital status: Married    Spouse name: Not on file  . Number of children: Not on file  . Years of education: Not on file  . Highest education level: Not on file  Occupational History  . Occupation: stay at home spouse  Tobacco Use  . Smoking status: Never Smoker  . Smokeless tobacco: Never Used  Vaping Use  . Vaping Use: Never used  Substance and Sexual Activity  . Alcohol use: Yes    Comment: occasional  . Drug use: No  . Sexual activity: Yes  Other Topics Concern  . Not on file  Social History Narrative   Lives at home with wife and two children.  Four children in all.     Social Determinants of Health   Financial Resource Strain: Not on file  Food Insecurity: Not on file  Transportation Needs: Not on file  Physical Activity: Not on file  Stress: Not on file  Social Connections: Not on file  Intimate Partner Violence: Not on file    Review of Systems  All other systems reviewed and are negative.      Objective:   Physical Exam Vitals reviewed.  Constitutional:      Appearance: He is obese.  Cardiovascular:     Rate and Rhythm: Normal rate and regular rhythm.     Heart sounds: Normal heart sounds.  Pulmonary:     Effort: Pulmonary effort is normal. No respiratory  distress.     Breath sounds: Wheezing and rales present. No rhonchi.  Musculoskeletal:  General: Deformity present.     Right lower leg: Edema present.     Left lower leg: Edema present.  Neurological:     Mental Status: He is alert.    Wt Readings from Last 3 Encounters:  06/06/20 (!) 465 lb (210.9 kg)  05/18/20 (!) 479 lb (217.3 kg)  05/04/20 (!) 486 lb (220.4 kg)          Assessment & Plan:   SOB (shortness of breath) on exertion  Depression, major, single episode, moderate (HCC)  Hypoxia  Community acquired bacterial pneumonia  Patient I believe may be developing community-acquired pneumonia.  Begin Levaquin 500 mg p.o. daily for 7 days.  Due to the reactive airway disease begin a prednisone taper pack and start using albuterol 2 puffs inhaled every 6 hours.  Reassess later this week if no better or sooner if worsening.  I congratulated the patient on his weight loss and will continue Trulicity 1.5 mg subcu weekly in addition to the weight loss management center.  Depression seems somewhat better on the combination of the Lexapro and the Wellbutrin so we will continue this for now.  Requested the patient to get a chest x-ray as soon as possible

## 2020-06-09 ENCOUNTER — Telehealth: Payer: Self-pay | Admitting: *Deleted

## 2020-06-09 ENCOUNTER — Other Ambulatory Visit: Payer: Self-pay | Admitting: Family Medicine

## 2020-06-09 MED ORDER — HYDROCOD POLST-CPM POLST ER 10-8 MG/5ML PO SUER: 5.0000 mL | Freq: Two times a day (BID) | ORAL | 0 refills | Status: DC | PRN

## 2020-06-09 NOTE — Telephone Encounter (Signed)
Received call from patient.   Reviewed CXR results with patient.   Patient also requested prescription for cough.  [facility] has Tussionex and Robitussin AC available.   Please advise

## 2020-06-12 ENCOUNTER — Ambulatory Visit: Payer: 59 | Admitting: Family Medicine

## 2020-06-12 ENCOUNTER — Other Ambulatory Visit: Payer: Self-pay | Admitting: Family Medicine

## 2020-06-12 ENCOUNTER — Encounter: Payer: Self-pay | Admitting: Family Medicine

## 2020-06-12 ENCOUNTER — Other Ambulatory Visit: Payer: Self-pay

## 2020-06-12 VITALS — BP 148/82 | HR 98 | Temp 97.9°F | Resp 18 | Ht 68.0 in | Wt >= 6400 oz

## 2020-06-12 DIAGNOSIS — R0902 Hypoxemia: Secondary | ICD-10-CM | POA: Diagnosis not present

## 2020-06-12 DIAGNOSIS — J159 Unspecified bacterial pneumonia: Secondary | ICD-10-CM | POA: Diagnosis not present

## 2020-06-12 DIAGNOSIS — F321 Major depressive disorder, single episode, moderate: Secondary | ICD-10-CM

## 2020-06-12 MED ORDER — MELOXICAM 15 MG PO TABS: 15.0000 mg | ORAL_TABLET | Freq: Every day | ORAL | 2 refills | Status: DC

## 2020-06-12 MED ORDER — PRAZOSIN HCL 2 MG PO CAPS: 2.0000 mg | ORAL_CAPSULE | Freq: Every day | ORAL | 2 refills | Status: DC

## 2020-06-12 NOTE — Progress Notes (Signed)
Subjective:    Patient ID: [name], male    DOB: 05-17-1975, 45 y.o.   MRN: [id number]  Illness  Anxiety    Sinus Problem   Patient was admitted in September for COVID pneumonia.  Subsequently readmitted for E.coli bacteremia ultimately determined to be due to a perirectal infection.  See most recent discharge summary: Admit date: 10/30/2019 Discharge date: 11/03/2019  Admitted From: Home Disposition: Home  Recommendations for Outpatient Follow-up:  1. Follow up with PCP in 1-2 weeks 2. Please obtain BMP/CBC in one week 3. Follow-up with general surgery as needed 4. He has been referred to outpatient physical therapy for wound care  Home Health: Home health RN Equipment/Devices:  Discharge Condition: Stable CODE STATUS: Full code Diet recommendation: Heart healthy  Brief/Interim Summary: 45 year old male with a history of hypertension, morbid obesity, diabetes mellitus type 2 presenting with increasing pain and drainage from his buttocks. The patient was recently admitted to the hospital from 10/19/2019 to 10/22/2019 secondary to respiratory failure as well as E. coli bacteremia. The patient was discharged home with cefdinir which he finished on 10/30/2019. It was felt that his respiratory failure was likely secondary to complications from his COVID-19 pneumonia in the setting of morbid obesity and OHS. He was discharged home on 2 L nasal cannula. In addition, the patient was also recently discharged after a stay from 09/29/2019 to 10/07/2019 after treatment for a COVID-19 pneumonia. The patient states that he had some gluteal pain even before he left the hospital on 10/22/2019. However, he has noted increasing edema and erythema and pain for the last 3 to 4 days. He is also noted some purulent drainage. He denied any fevers, chills, chest pain, shortness breath, cough, hemoptysis, vomiting, diarrhea, abdominal pain. In the emergency department, the patient had  low-grade temperature of 9 9.1 F. He was mildly tachycardic up to 110. He was hemodynamically stable. Oxygen saturation was 97% on room air. The patient was started on vancomycin and Zosyn. General surgery was consulted to assist with management.  Discharge Diagnoses:  Principal Problem:   Abscess and cellulitis of gluteal region Active Problems:   Severe obesity (BMI >= 40) (HCC)   Acute respiratory failure with hypoxia due to Covid    Microcytic anemia   Sepsis due to undetermined organism (HCC)   Chronic respiratory failure with hypoxia (HCC)  Sepsis -Present on admission -Secondary to gluteal abscess -Presented with tachycardia and WBC up to 16.9 -Lactic acid 1.6 -Follow cultures--VRE+Ecoli -Continue vancomycin and Zosyn  Gluteal abscess -Appreciate surgical consult -Case discussed with Dr. Jorja Loa suspicion of fistula at this time-->treat as gluteal abscess with outpatient follow-up and consideration for MRI as an outpatient if clinically worsens or no improvement -Continue vancomycin and Zosyn -WBC decreasing 16.9>>14.0>>10.8 -NON-surgical setting cultures = VRE and Ecoli -Doubt VRE is true pathogen as patient is improving and WBC improving without active tx against VRE -Treated with vancomycin/Zosyn -Since he is clinically improved, antibiotics transition to doxycycline/Augmentin -He has been referred for outpatient wound care with physical therapy -will need HHRN to help with dressing changes  Controlled diabetes mellitus type 2 -10/19/2019 hemoglobin A1c 7.1 -NovoLog sliding scale  Morbid obesity -BMI 60.82 -Lifestyle modification  Chronic respiratory failure with hypoxia -Stable on 2 L nasal cannula -Encourage incentive spirometry  Iron deficiency anemia -10/20/2019 iron saturation 7% -Ferritin 289 -start iron once infx resolved, as an outpatient  11/09/19 Patient is here today for follow-up.  He has seen wound care every week.  They have  appointment to see him tomorrow.  He states that the wound is improving on his gluteal area and that the pain is getting better.  He denies any fever.  He denies any chills.  He denies any nausea or vomiting.  He does have significant pain in the gluteal area especially when he sits which is primarily the majority of the day.  He is still on oxygen 2 L via nasal cannula.  He is 97% on 2 L but if he takes his oxygen off it drops to 89% even at rest.  He denies any chest pain however he does have shortness of breath with minimal activity.  He also continues to have a nonproductive cough.  Fortunately his lungs sound relatively clear on examination today.  We reviewed his lab work and we discussed his diabetes diagnosis that he was unaware of.  We discussed dietary changes.  We also mentioned that he is severely anemic and that his iron level was low.  I recommended starting iron sulfate 324 mg daily.  At the present time he is not taking any iron supplement.  He denies any pleurisy he does have fibrotic changes in both legs consistent with chronic lymphedema.  He does have edema in both legs to the knees but there is no evidence of a DVT or PE on his exam today.  At that time, my plan was: Discontinue tramadol and replace with Norco 5/325 1-2 p.o. every 6 hours as needed pain.  He can also use Tussionex 1 teaspoon every 12 hours as needed for cough.  Clinically his cellulitis is improving the gluteal region.  He has no signs of systemic illness or bacteremia such as fever, etc.  I will repeat a blood culture today and also check a CBC to monitor for any leukocytosis.  Complete Augmentin and doxycycline as prescribed.  Patient is seeing wound clinic for wound checks and he has an appointment tomorrow.  Repeat chest x-ray in 2 weeks and see the patient back in 2 weeks to reassess weaning off oxygen.  At the present time he is not ready to do so.  At that point I will give the patient a flu shot.  Would recommend a  Covid vaccine 90 days after his infection.  Discussed diabetes and recommended a low carbohydrate diet.  11/23/19 Patient is here today for a checkup.  His oxygen is 93% on room air.  However he states that when he is up walking around, it will drop into the mid 80s.  He does report some pleurisy with deep inspiration and he still has a resting tachycardia of 104 to 108 bpm.  He denies any hemoptysis or purulent sputum.  His cough is getting better.  Chest x-ray looks considerably better compared to the chest x-ray from September 28 although it does have some residual opacities which may reflect scar tissue.  He is due to recheck his CBC to monitor his anemia.  He is on iron tablet.  He reports that he is not sleeping.  He states that he can only get about 2 hours of sleep to 3 hours of sleep and then he is wide-awake.  There is no particular symptom that is keeping him awake is just that he is unable to rest.  He also continues to have pain around the the healing wound on his right gluteus.  I examined the wound today with his wife.  She is doing an excellent job taking care of the wound.  There is a circular patch of granulation tissue roughly 5 cm x 3 cm.  There is no ulcer or cavity.  It is filled in almost completely.  It is still tender to the touch.  There is no surrounding erythema or warmth to suggest cellulitis.  There is no palpable fluctuance.  The wound appears to be healing well.  He is still seeing wound care.  At that time, my plan was: Given his tachycardia and pleurisy and shortness of breath I will check a D-dimer to evaluate for possible pulmonary embolism given his recent Covid.  If D-dimer is positive I would recommend a CT angiogram of the chest.  Recheck CBC to monitor his anemia.  Also recheck a CMP to monitor his glucose and kidney function.  However I am pleased by his breath sounds today and the improvement on his chest x-ray.  I hope that over the next 3 to 4 weeks he will be able to  wean off oxygen completely.  I will give him Ambien 10 mg p.o. nightly as needed insomnia but I encouraged him to try to wean away from the pain medication and not to take the pain medication at the same time as the Ambien.  Also gave the patient compression hose prescription for knee-high compression hose 15 to 20 mmHg to help with the lymphedema in his legs.  As a side note, the patient reports numbness and tingling in both hands distal to the wrist with pain in his fingers.  I suspect carpal tunnel syndrome.  We discussed nerve conduction studies but we will defer that at the present time.  12/21/19 Patient has rapidly gained weight.  His weight is up over 50 pounds since I last saw him.  His legs are swollen and tight and tender to palpation.  He also reports shortness of breath with activity.  He had a positive D-dimer at his last visit so we did obtain a CT scan but no pulmonary embolism was found only residual inflammation in the lungs from his Covid pneumonia.  However I am concerned about right-sided heart failure due to this as well as due to his morbid obesity.  I believe the patient likely has diastolic heart failure even though he had a normal echocardiogram in September 29.  He denies any chest pain.  He does have some orthopnea.  He also reports depression and anxiety.  He has having trouble sleeping.  He reports panic attacks.  He states that if his wife leaves him alone he panics.  She states that if he wakes up and she is not in the home he will panic.  At that time, my plan was: Patient clearly is fluid overloaded.  I believe the patient has acute diastolic heart failure or right-sided heart failure due to his underlying lung damage.  Begin Lasix 40 mg twice daily along with K-Dur 20 milliequivalents daily.  Check BNP, CBC, CMP.  Reassess weight and renal function in 1 week and titrate diuretics to achieve the desired effect.  Meanwhile start Lexapro for GAD 10 mg a day.  12/31/19 Patient  has diuresed and lost over 20 pounds of fluid.  The swelling in his legs is better although it is still very prominent.  His breathing is slightly better.  He states however if he sits without oxygen his oxygen will drop to 90% on room air.  He denies any cramping.  He denies any dizziness.  He denies any lightheadedness however he does continue to have  dyspnea on exertion and easy fatigability.  He continues to have severe pain in his ankles and in his legs with ambulation.  He is using hydrocodone 1-2 times a day.  The Lexapro was not helping with panic attacks however he is only been on the medication for about 10 days.  I explained to him that this is to be expected and that we need to be patient.  At that time, my plan was: I believe the patient has diastolic heart failure.  He may also have right-sided heart failure.  I believe is due to a combination of morbid obesity, scarring in the lungs due to Covid, untreated obstructive sleep apnea.  Therefore continue Lasix 40 mg twice daily.  Check a CMP to monitor electrolytes to determine if we need to increase potassium supplementation or decrease diuretic.  I would like to see the patient lose an additional 20 pounds of fluid.  Therefore I would like to recheck via telephone in 1 week assuming his electrolytes are normal.  However I believe part of his issue could be his tachycardia.  The tachycardia decreases filling time for the heart and if he does have a stiff heart consistent with diastolic dysfunction this only exacerbates his decreased cardiac output.  Therefore I will start the patient on metoprolol XL 25 mg a day in an effort to try to slow his tachycardia and improve filling time to increase cardiac output.  I will also consult cardiology for any additional recommendations to manage diastolic dysfunction.  I did refill his hydrocodone 5/325 1 tablet twice daily 60 tablets/month.  Reassess the patient via telephone in 1 week.  Try to achieve an  additional 20 pounds of diuresis assuming electrolytes are normal  05/08/20 Patient has gained over 50 pounds since I last saw him!.  While some of this is certainly swelling I believe most of this is likely obesity.  He admits to overeating.  He admits that he has emotional issues and that "I have a problem".  He is clearly depressed.  He reports daily anxiety.  He reports anhedonia.  He reports depression.  Lexapro is not helping.  He is also asking to increase his painkillers.  He is taking 2 a day for 78-month total.  He is extremely sedentary.  I declined.  I stated that I feel that that would lead to a problem.  Furthermore, I feel that this is pointing to habituation and dependency.  I was clear with the patient.  I stated that we need to make serious changes or else his life expectancy is going to be drastically reduced.  We need to increase his activity, change his dietary habits, achieve significant weight loss.  All of this is necessary in order to prolong his life and improve his quality of life.  He agrees.  He has an appointment to see weight management coming up.  Cardiology just increased his fluid pills.  He does report symptoms of a sinus infection.  He also reports pain in his right gluteus.  It is posterior to the greater trochanter.  That area is not indurated or firm or red.  There is no palpable abnormality.  There is no palpable abscess.  There is no visible deformity.  Is been tender in that area for approximately a week.  Range of motion in the hip does not seem to exacerbate it.  Differential diagnosis includes musculoskeletal pain.  At that time, my plan was: First, I will try to address his depression.  We increase Lexapro to 20 mg a day and added Wellbutrin extended release 150 mg p.o. every morning as an adjunctive therapy.  Reassess in 4 weeks.  Second I declined to increase his hydrocodone due to concerns over habituation and dependency.  Third we need to address his morbid  obesity.  I certainly support him seeing weight management.  We also discussed options and I gave him samples of Trulicity 0.75 mg subcu weekly.  In 2 weeks if tolerating, he will increase to 1.5 mg subcu weekly thereafter.  Also explained to the patient that he needs to make drastic lifestyle changes in order to prevent early mortality and reduce his morbidity.  Therefore I have strongly encouraged exercise, weight loss, and dietary changes.  Obtain chest x-ray as he continues to report pleurisy and shortness of breath however his pulmonary exam today is excellent and I feel the majority of the shortness of breath at this point is due to morbid obesity and deconditioning.  06/06/20 Patient is appointment today was supposed to be a follow-up.  However, he is having worsening cough and breathing issues.  He saw my partner earlier this week and was diagnosed with possible allergies.  However, the patient has a low-grade temperature today of 99.6.  His oxygen was 86% on 2 L when he walked in.  He was tachycardic and short of breath.  On exam today, he has right basilar crackles and diffuse expiratory wheezing all throughout his lungs.  I believe that he is developing community-acquired pneumonia in the right lung and reactive airway disease causing shortness of breath.  On a positive note, he has lost almost 20 pounds since starting Trulicity.  I congratulated him on this.  He also feels some better since starting Wellbutrin.  However today we focused mainly on with his breathing due to the fevers and shortness of breath. At that time, my plan was: Patient I believe may be developing community-acquired pneumonia.  Begin Levaquin 500 mg p.o. daily for 7 days.  Due to the reactive airway disease begin a prednisone taper pack and start using albuterol 2 puffs inhaled every 6 hours.  Reassess later this week if no better or sooner if worsening.  I congratulated the patient on his weight loss and will continue Trulicity  1.5 mg subcu weekly in addition to the weight loss management center.  Depression seems somewhat better on the combination of the Lexapro and the Wellbutrin so we will continue this for now.  Requested the patient to get a chest x-ray as soon as possible  06/12/20 Patient's lungs sound much better today.  He is 97% on his oxygen.  Last week he was 86%.  His lungs sound much clearer today.  The crackles have dissipated at the bases of his lungs per tickly on his right side.  His breath sounds are clear now.  He also has no further rhonchi or wheezing.  He states that his breathing is much better.  He just finished the prednisone today.  Since his breathing is better, we spent more time today discussing his depression.  He states that the depression has improved.  The anhedonia has improved.  The hopelessness has improved.  However both he and his wife report that he is having nightmares almost on a nightly basis.  He will wake up having terrible dreams.  Some of the dreams are related to his time in the hospital with COVID.  Some of the dreams are that he has lost his family.  However I believe the nightmares on a daily basis are certainly disrupting his sleep and seem to be very troublesome to him.  I question if the patient may be dealing with PTSD as he certainly battled a life-threatening illness and has not recovered.  I can help but believe that this has him questioning his mortality and contributes dramatically to his depression.  We discussed prazosin for nightmares and he is certainly interested he is tolerating Trulicity 1.5 mg weekly.  He states that his joints feel much better after taking prednisone however I explained the long-term risk of prednisone and that this is not a viable option.  However he is not currently taking any NSAIDs.  He states that he tried ibuprofen and Aleve with no relief so he stopped them. Past Medical History:  Diagnosis Date  . Anemia   . Anxiety and depression   . B12  deficiency   . Back pain   . COVID    9/21- hypoxia requiring oxygen  . Diabetes mellitus without complication (HCC)   . Fluid retention   . Frequency of urination   . GERD (gastroesophageal reflux disease)   . Gluteal abscess    ecoli after covid hospitalization 2021  . Headache   . Hypertension   . Hypoxia   . Joint pain   . Muscle pain   . Nervousness   . Obesity   . Other fatigue   . Sleep apnea   . SOB (shortness of breath) on exertion   . Stomach ulcer   . Tachycardia   . Thoracic aortic aneurysm (HCC)   . Weakness    Past Surgical History:  Procedure Laterality Date  . None     Current Outpatient Medications on File Prior to Visit  Medication Sig Dispense Refill  . acetaminophen (TYLENOL) 325 MG tablet Take 650 mg by mouth every 6 (six) hours as needed.    Marland Kitchen albuterol (VENTOLIN HFA) 108 (90 Base) MCG/ACT inhaler Inhale 2 puffs into the lungs every 4 (four) hours as needed for wheezing or shortness of breath. 18 g 2  . ascorbic acid (VITAMIN C) 500 MG tablet Take 1 tablet (500 mg total) by mouth daily. 30 tablet 1  . benzonatate (TESSALON) 100 MG capsule Take 1 capsule (100 mg total) by mouth 2 (two) times daily as needed for cough. Take first dose at night time and monitor for drowsiness.  Do not take at the same time as other medications that can make you drowsy.  If this medicine makes you drowsy, do not take while driving or operating heavy machinery. 20 capsule 0  . buPROPion (WELLBUTRIN XL) 150 MG 24 hr tablet Take 1 tablet (150 mg total) by mouth daily. 30 tablet 5  . cetirizine (ZYRTEC) 10 MG tablet Take 1 tablet (10 mg total) by mouth daily. 30 tablet 2  . chlorpheniramine-HYDROcodone (TUSSIONEX PENNKINETIC ER) 10-8 MG/5ML SUER Take 5 mLs by mouth every 12 (twelve) hours as needed for cough. 115 mL 0  . Dulaglutide (TRULICITY) 1.5 MG/0.5ML SOPN Inject 1.5 mg into the skin once a week. 3 mL 3  . escitalopram (LEXAPRO) 20 MG tablet Take 1 tablet (20 mg total) by  mouth daily. 30 tablet 5  . FEROSUL 325 (65 Fe) MG tablet TAKE 1 TABLET BY MOUTH EVERY MORNING 30 tablet 3  . fluticasone (FLONASE) 50 MCG/ACT nasal spray Place 2 sprays into both nostrils daily. 16 g 6  . furosemide (LASIX) 80 MG tablet Take 1 tablet (80 mg  total) by mouth 2 (two) times daily. 180 tablet 3  . HYDROcodone-acetaminophen (NORCO) 5-325 MG tablet Take 1 tablet by mouth every 6 (six) hours as needed for moderate pain. 60 tablet 0  . metoprolol succinate (TOPROL-XL) 100 MG 24 hr tablet Take 1 tablet (100 mg total) by mouth daily. 30 tablet 6  . Multiple Vitamin (MULTIVITAMIN) tablet Take 1 tablet by mouth daily.    . potassium chloride SA (KLOR-CON) 20 MEQ tablet Take 2 tablets (40 mEq total) by mouth daily. 180 tablet 3  . sodium chloride (OCEAN) 0.65 % SOLN nasal spray Place 1 spray into both nostrils as needed for congestion. 88 mL 0  . zolpidem (AMBIEN) 10 MG tablet TAKE 1 TABLET(10 MG) BY MOUTH AT BEDTIME AS NEEDED FOR SLEEP 30 tablet 3   No current facility-administered medications on file prior to visit.   No Known Allergies Social History   Socioeconomic History  . Marital status: Married    Spouse name: Not on file  . Number of children: Not on file  . Years of education: Not on file  . Highest education level: Not on file  Occupational History  . Occupation: stay at home spouse  Tobacco Use  . Smoking status: Never Smoker  . Smokeless tobacco: Never Used  Vaping Use  . Vaping Use: Never used  Substance and Sexual Activity  . Alcohol use: Yes    Comment: occasional  . Drug use: No  . Sexual activity: Yes  Other Topics Concern  . Not on file  Social History Narrative   Lives at home with wife and two children.  Four children in all.     Social Determinants of Health   Financial Resource Strain: Not on file  Food Insecurity: Not on file  Transportation Needs: Not on file  Physical Activity: Not on file  Stress: Not on file  Social Connections: Not on  file  Intimate Partner Violence: Not on file    Review of Systems  All other systems reviewed and are negative.      Objective:   Physical Exam Vitals reviewed.  Constitutional:      Appearance: He is obese.  Cardiovascular:     Rate and Rhythm: Normal rate and regular rhythm.     Heart sounds: Normal heart sounds.  Pulmonary:     Effort: Pulmonary effort is normal. No respiratory distress.     Breath sounds: Normal breath sounds. No stridor. No wheezing, rhonchi or rales.  Musculoskeletal:        General: Deformity present.     Right lower leg: Edema present.     Left lower leg: Edema present.  Neurological:     Mental Status: He is alert.    Wt Readings from Last 3 Encounters:  06/12/20 (!) 470 lb (213.2 kg)  06/06/20 (!) 465 lb (210.9 kg)  05/18/20 (!) 479 lb (217.3 kg)          Assessment & Plan:   Depression, major, single episode, moderate (HCC)  Hypoxia  Community acquired bacterial pneumonia  Clinically, the patient seems to have recovered nicely from his pneumonia.  He is finished his Levaquin and his prednisone.  No further treatment is necessary for this.  Regarding his depression, he seems to be doing well on the combination of Lexapro and Wellbutrin.  We discussed adding prazosin for PTSD related nightmares and he is interested in trying it.  Therefore we will start prazosin 2 mg at night.  Regarding his diffuse joint  pain and body pain we will try adding meloxicam 15 mg daily as well.  I recommended the patient adjust to this first and if he is doing well in a couple weeks we can try increasing his Trulicity to 3 mg a day to assist in weight loss.

## 2020-06-13 ENCOUNTER — Ambulatory Visit (INDEPENDENT_AMBULATORY_CARE_PROVIDER_SITE_OTHER): Payer: 59 | Admitting: Family Medicine

## 2020-06-14 ENCOUNTER — Other Ambulatory Visit: Payer: Self-pay | Admitting: Family Medicine

## 2020-06-15 MED ORDER — HYDROCODONE-ACETAMINOPHEN 5-325 MG PO TABS: 1.0000 | ORAL_TABLET | Freq: Four times a day (QID) | ORAL | 0 refills | Status: DC | PRN

## 2020-06-15 NOTE — Telephone Encounter (Signed)
Ok to refill??  Last office visit 06/12/2020.  Last refill 05/16/2020.

## 2020-06-21 ENCOUNTER — Telehealth: Payer: Self-pay | Admitting: *Deleted

## 2020-06-21 NOTE — Telephone Encounter (Signed)
Received call from [place], [phone], ext [phone].  Reports that patient is attempting to obtain BiPAP from [facility]. Reports that [facility] has not received order or documentation.   Per chart notes, sleep study was performed on 03/30/2020 with recommendations as follows.   RECOMMENDATIONS - Recommnend an initial trial of BiPAP Auto therapy with EPAP min of 16 cm, PS of 4 and IPAP max of 25 cm with 2 liters of supplemental oxygen. A large size Philips Respironics Nasal Pillow Mask Nuance Pro Gel mask was used for the titration. - Effort should be made to optimize nasal and oropharyngeal patency. - Avoid alcohol, sedatives and other CNS depressants that may worsen sleep apnea and disrupt normal sleep architecture. - Sleep hygiene should be reviewed to assess factors that may improve sleep quality. - Weight management (BMI 71) and regular exercise should be initiated or continued. - Recommend a downlioad in 3o days and sleep clinic evaluation after 4 weeks of therapy.  Please advise.

## 2020-06-26 ENCOUNTER — Other Ambulatory Visit: Payer: Self-pay | Admitting: Cardiovascular Disease

## 2020-06-26 DIAGNOSIS — G4733 Obstructive sleep apnea (adult) (pediatric): Secondary | ICD-10-CM

## 2020-06-29 ENCOUNTER — Ambulatory Visit (INDEPENDENT_AMBULATORY_CARE_PROVIDER_SITE_OTHER): Payer: 59 | Admitting: Family Medicine

## 2020-07-02 NOTE — Progress Notes (Signed)
Cardiology Clinic Note   Patient Name: Ricardo Lopez. Date of Encounter: 07/04/2020  Primary Care Provider:  [name], MD Primary Cardiologist:  Rollene Rotunda, MD  Patient Profile    Ricardo Lopez 45 year old male presents the clinic today for follow-up evaluation of shortness of breath, palpitations, and hypertension.  Past Medical History    Past Medical History:  Diagnosis Date   Anemia    Anxiety and depression    B12 deficiency    Back pain    COVID    9/21- hypoxia requiring oxygen   Diabetes mellitus without complication (HCC)    Fluid retention    Frequency of urination    GERD (gastroesophageal reflux disease)    Gluteal abscess    ecoli after covid hospitalization 2021   Headache    Hypertension    Hypoxia    Joint pain    Muscle pain    Nervousness    Obesity    Other fatigue    Sleep apnea    SOB (shortness of breath) on exertion    Stomach ulcer    Tachycardia    Thoracic aortic aneurysm (HCC)    Weakness    Past Surgical History:  Procedure Laterality Date   None      Allergies  No Known Allergies  History of Present Illness    Ricardo Lopez is a PMH of lower extremity edema, shortness of breath, LVH, COVID-19 infection, morbid obesity, hypoventilation syndrome, and respiratory failure.   He was last seen by Dr. Antoine Poche on 01/28/2020.  During that time he continued on 2 L nasal cannula.  On occasion he reported needing 3.  His chest CT showed residual groundglass opacities.  He reported that his chief complaint was increased fatigue.  He was not able to sleep more than 3-4 hours per night.  He denied chest pain, arm and neck discomfort.  He reported that he did snore and felt he did have some sleep apnea that was witnessed by his wife.  His lower extremity swelling was much worse after Covid.  He reported that his lower extremity swelling had however improved somewhat.  He denied presyncope and syncope.  He reported chronic elevated  heart rate and also feeling his heart race.  His EKG showed sinus tachycardia 112 bpm with no ST or T wave deviation.   A cardiac event monitor was ordered and showed an average heart rate in the 90s with rare atrial ectopy.  No other  arrhythmias were noted.     He presented to the clinic 02/21/2020 for follow-up evaluation stated he continued with 2 L of oxygen nasal cannula.  He occasionally needed 3 L of oxygen when increasing his physical activity.  We reviewed his cardiac event monitor and echocardiogram.  He reported that his blood pressure at home was similar to his readings today in the 140s over 70s-80s.  I increased his metoprolol, give him salty 6 diet sheet, have him increase physical activity as tolerated and refer him to [facility] weight management program.  We planned follow-up in 2 months.   He was seen by Dr. Antoine Poche on 04/18/2020.  At that time his weight was over 500 pounds.  He was depressed.  He was noted to have increased leg swelling.  He noted increased fatigue.  He underwent sleep study and BiPAP was ordered.  He denied new chest discomfort, neck and arm discomfort.  He denied new palpitations presyncope and syncope.  Continue with  chronic O2.  He presents the clinic today for follow-up evaluation states he continues to use oxygen chronically.  He had another bout of pneumonia and was put back on prednisone.  He reports that he had lost around 25 pounds and his weight slowly increased with prednisone dosing.  His weight today is 493 pounds.  His blood pressure is well controlled at 128/78.  He denies episodes of chest pain.  He reports that he is still not received his BiPAP machine but has been updated that it is on backorder.  We will give him a salty 6 diet sheet, continue his current medication regimen, have him increase physical activity as tolerated and follow-up in 6 months.  Today he denies chest pain, increased shortness of breath, lower extremity edema, fatigue,  palpitations, melena, hematuria, hemoptysis, diaphoresis, weakness, presyncope, syncope, orthopnea, and PND.  Home Medications    Prior to Admission medications   Medication Sig Start Date End Date Taking? Authorizing Provider  acetaminophen (TYLENOL) 325 MG tablet Take 650 mg by mouth every 6 (six) hours as needed.    [provider]  albuterol (VENTOLIN HFA) 108 (90 Base) MCG/ACT inhaler Inhale 2 puffs into the lungs every 4 (four) hours as needed for wheezing or shortness of breath. 10/22/19   Shon HaleEmokpae, Courage, MD  ascorbic acid (VITAMIN C) 500 MG tablet Take 1 tablet (500 mg total) by mouth daily. 10/23/19   Shon HaleEmokpae, Courage, MD  benzonatate (TESSALON) 100 MG capsule Take 1 capsule (100 mg total) by mouth 2 (two) times daily as needed for cough. Take first dose at night time and monitor for drowsiness.  Do not take at the same time as other medications that can make you drowsy.  If this medicine makes you drowsy, do not take while driving or operating heavy machinery. 06/02/20   Valentino NoseMartinez, Jessica A, NP  buPROPion (WELLBUTRIN XL) 150 MG 24 hr tablet Take 1 tablet (150 mg total) by mouth daily. 05/04/20   Donita BrooksPickard, Warren T, MD  cetirizine (ZYRTEC) 10 MG tablet Take 1 tablet (10 mg total) by mouth daily. 06/02/20   Valentino NoseMartinez, Jessica A, NP  chlorpheniramine-HYDROcodone (TUSSIONEX PENNKINETIC ER) 10-8 MG/5ML SUER Take 5 mLs by mouth every 12 (twelve) hours as needed for cough. 06/09/20   Donita BrooksPickard, Warren T, MD  Dulaglutide (TRULICITY) 1.5 MG/0.5ML SOPN Inject 1.5 mg into the skin once a week. 06/06/20   Donita BrooksPickard, Warren T, MD  escitalopram (LEXAPRO) 20 MG tablet Take 1 tablet (20 mg total) by mouth daily. 05/04/20   Donita BrooksPickard, Warren T, MD  FEROSUL 325 (65 Fe) MG tablet TAKE 1 TABLET BY MOUTH EVERY MORNING 03/20/20   Donita BrooksPickard, Warren T, MD  fluticasone [facility](FLONASE) 50 MCG/ACT nasal spray Place 2 sprays into both nostrils daily. 06/02/20   Valentino NoseMartinez, Jessica A, NP  furosemide (LASIX) 80 MG tablet Take 1 tablet  (80 mg total) by mouth 2 (two) times daily. 04/18/20 07/17/20  Rollene RotundaHochrein, James, MD  HYDROcodone-acetaminophen (NORCO) 5-325 MG tablet Take 1 tablet by mouth every 6 (six) hours as needed for moderate pain. 06/15/20   Donita BrooksPickard, Warren T, MD  meloxicam (MOBIC) 15 MG tablet Take 1 tablet (15 mg total) by mouth daily. 06/12/20   Donita BrooksPickard, Warren T, MD  metoprolol succinate (TOPROL-XL) 100 MG 24 hr tablet Take 1 tablet (100 mg total) by mouth daily. 02/21/20   Ronney Astersleaver, Lenin Kuhnle M, NP  Multiple Vitamin (MULTIVITAMIN) tablet Take 1 tablet by mouth daily.    [provider]  potassium chloride SA (KLOR-CON) 20  MEQ tablet Take 2 tablets (40 mEq total) by mouth daily. 04/18/20 07/17/20  Rollene Rotunda, MD  prazosin (MINIPRESS) 2 MG capsule Take 1 capsule (2 mg total) by mouth at bedtime. 06/12/20   [name], MD  sodium chloride (OCEAN) 0.65 % SOLN nasal spray Place 1 spray into both nostrils as needed for congestion. 06/02/20   Valentino Nose, NP  zolpidem (AMBIEN) 10 MG tablet TAKE 1 TABLET(10 MG) BY MOUTH AT BEDTIME AS NEEDED FOR SLEEP 05/01/20   [name], MD    Family History    Family History  Problem Relation Age of Onset   Diabetes Mother    Hypertension Mother    Hyperlipidemia Mother    Thyroid disease Mother    Diabetes Father    Hyperlipidemia Father    Hypertension Father    Depression Father    Anxiety disorder Father    Sleep apnea Father    He indicated that his mother is alive. He indicated that his father is alive.  Social History    Social History   Socioeconomic History   Marital status: Married    Spouse name: Not on file   Number of children: Not on file   Years of education: Not on file   Highest education level: Not on file  Occupational History   Occupation: stay at home spouse  Tobacco Use   Smoking status: Never   Smokeless tobacco: Never  Vaping Use   Vaping Use: Never used  Substance and Sexual Activity   Alcohol use: Yes    Comment:  occasional   Drug use: No   Sexual activity: Yes  Other Topics Concern   Not on file  Social History Narrative   Lives at home with wife and two children.  Four children in all.     Social Determinants of Health   Financial Resource Strain: Not on file  Food Insecurity: Not on file  Transportation Needs: Not on file  Physical Activity: Not on file  Stress: Not on file  Social Connections: Not on file  Intimate Partner Violence: Not on file     Review of Systems    General:  No chills, fever, night sweats or weight changes.  Cardiovascular:  No chest pain, dyspnea on exertion, edema, orthopnea, palpitations, paroxysmal nocturnal dyspnea. Dermatological: No rash, lesions/masses Respiratory: No cough, dyspnea Urologic: No hematuria, dysuria Abdominal:   No nausea, vomiting, diarrhea, bright red blood per rectum, melena, or hematemesis Neurologic:  No visual changes, wkns, changes in mental status. All other systems reviewed and are otherwise negative except as noted above.  Physical Exam    VS:  BP 128/78   Pulse 99   Ht 5\' 8"  (1.727 m)   Wt (!) 493 lb (223.6 kg)   SpO2 98%   BMI 74.96 kg/m  , BMI Body mass index is 74.96 kg/m. GEN: Well nourished, well developed, in no acute distress. HEENT: normal. Neck: Supple, no JVD, carotid bruits, or masses. Cardiac: RRR, no murmurs, rubs, or gallops. No clubbing, cyanosis, edema.  Radials/DP/PT 2+ and equal bilaterally.  Respiratory:  Respirations regular and unlabored, clear to auscultation bilaterally. GI: Soft, nontender, nondistended, BS + x 4. MS: no deformity or atrophy. Skin: warm and dry, no rash. Neuro:  Strength and sensation are intact. Psych: Normal affect.  Accessory Clinical Findings    Recent Labs: 11/02/2019: Magnesium 2.0 12/31/2019: Platelets 289 01/28/2020: BNP <2.5 05/18/2020: ALT 25; BUN 13; Creatinine, Ser 0.98; Hemoglobin 10.2; Potassium 4.4;  Sodium 141; TSH 4.680   Recent Lipid Panel    Component  Value Date/Time   CHOL 221 (H) 05/18/2020 1357   TRIG 127 05/18/2020 1357   HDL 53 05/18/2020 1357   LDLCALC 145 (H) 05/18/2020 1357    ECG personally reviewed by me today- none today.   Echocardiogram 10/20/2019 IMPRESSIONS     1. Left ventricular ejection fraction, by estimation, is 55 to 60%. The  left ventricle has normal function. The left ventricle has no regional  wall motion abnormalities. There is mild left ventricular hypertrophy.  Left ventricular diastolic parameters  are indeterminate.   2. Right ventricular systolic function is mildly reduced. The right  ventricular size is normal. Tricuspid regurgitation signal is inadequate  for assessing PA pressure.   3. Left atrial size was moderately dilated.   4. The mitral valve is grossly normal. Trivial mitral valve  regurgitation.   5. The aortic valve is tricuspid. Aortic valve regurgitation is not  visualized.   6. The inferior vena cava is dilated in size with <50% respiratory  variability, suggesting right atrial pressure of 15 mmHg.   Cardiac event monitor 02/16/2008 Patient had a min HR of 72 bpm, max HR of 152 bpm, and avg HR of 93 bpm. Predominant underlying rhythm was Sinus Rhythm. Isolated SVEs were rare (<1.0%), SVE Couplets were rare (<1.0%), and no SVE Triplets were present. Isolated VEs were rare (<1.0%), and no VE Couplets or VE Triplets were present. Patch Wear Time: 2 days and 2 hours (2022-01-15T10:25:45-0500 to 2022-01-17T13:03:43-0500) Patient had a min HR of 72 bpm, max HR of 152 bpm, and avg HR of 93 bpm. Predominant underlying rhythm was Sinus Rhythm. Isolated SVEs were rare (<1.0%), SVE Couplets were rare (<1.0%), and no SVE Triplets were present. Isolated VEs were rare (<1.0%), and no VE Couplets or VE Triplets were present.   The rhythm was sinus There were no sustained arrhythmias. Rare atrial ectopy No change in therapy.  Assessment & Plan   1.  Essential hypertension-BP today 128/78.   Well-controlled at home Continue metoprolol Not received BiPAP Heart healthy low-sodium diet-salty 6 given Increase physical activity as tolerated   Obstructive sleep apnea- Not received BiPAP yet. Continue weight loss  Tachycardia-heart rate today 99 bpm.  Denies increased palpitations.    Continue metoprolol to 100 mg daily Heart healthy low-sodium diet-salty 6 given Increase physical activity as tolerated Avoid triggers caffeine, chocolate, EtOH, dehydration etc.  Morbid obesity-weight today 493 pounds.  Spoke with health educator about more specific low carbohydrate diet. Continue weight loss.  Heart healthy low-sodium diet Increase physical activity as tolerated Follows with PCP  Sleep disturbance-reports compliance with Ambien.  Continues to use 2.5 L of oxygen at night. Sleep hygiene information given.  Disposition: Follow-up with Dr. Antoine Poche in 6 months.  Thomasene Ripple. Onie Hayashi NP-C    07/04/2020, 10:22 AM [facility] [address] Office [phone] Fax [phone]  Notice: This dictation was prepared with Dragon dictation along with smaller phrase technology. Any transcriptional errors that result from this process are unintentional and may not be corrected upon review.  I spent 15 minutes examining this patient, reviewing medications, and using patient centered shared decision making involving her cardiac care.  Prior to her visit I spent greater than 20 minutes reviewing her past medical history,  medications, and prior cardiac tests.

## 2020-07-04 ENCOUNTER — Ambulatory Visit: Payer: 59 | Admitting: General Practice

## 2020-07-04 ENCOUNTER — Other Ambulatory Visit: Payer: Self-pay

## 2020-07-04 ENCOUNTER — Encounter: Payer: Self-pay | Admitting: General Practice

## 2020-07-04 VITALS — BP 128/78 | HR 99 | Ht 68.0 in | Wt >= 6400 oz

## 2020-07-04 DIAGNOSIS — I10 Essential (primary) hypertension: Secondary | ICD-10-CM | POA: Diagnosis not present

## 2020-07-04 DIAGNOSIS — R000 Tachycardia, unspecified: Secondary | ICD-10-CM

## 2020-07-04 DIAGNOSIS — G4733 Obstructive sleep apnea (adult) (pediatric): Secondary | ICD-10-CM

## 2020-07-04 NOTE — Patient Instructions (Signed)
Medication Instructions:  The current medical regimen is effective;  continue present plan and medications as directed. Please refer to the Current Medication list given to you today.  *If you need a refill on your cardiac medications before your next appointment, please call your pharmacy*  Lab Work:   Testing/Procedures:  NONE    NONE  Special Instructions PLEASE READ AND FOLLOW SLEEP TIPS-ATTACHED  PLEASE READ AND FOLLOW SALTY 6-ATTACHED-1,800mg  daily  PLEASE INCREASE PHYSICAL ACTIVITY AS TOLERATED  Follow-Up: Your next appointment:  6 month(s) In Person with Rollene Rotunda, MD ONLY  At [facility], you and your health needs are our priority.  As part of our continuing mission to provide you with exceptional heart care, we have created designated Provider Care Teams.  These Care Teams include your primary Cardiologist (physician) and Advanced Practice Providers (APPs -  Physician Assistants and Nurse Practitioners) who all work together to provide you with the care you need, when you need it.            6 SALTY THINGS TO AVOID     1,800MG  DAILY   Quality Sleep Information, Adult Quality sleep is important for your mental and physical health. It also improves your quality of life. Quality sleep means you: Are asleep for most of the time you are in bed. Fall asleep within 30 minutes. Wake up no more than once a night.  Are awake for no longer than 20 minutes if you do wake up during the night. Most adults need 7-8 hours of quality sleep each night. How can poor sleep affect me? If you do not get enough quality sleep, you may have: Mood swings. Daytime sleepiness. Confusion. Decreased reaction time. Sleep disorders, such as insomnia and sleep apnea. Difficulty with: Solving problems. Coping with stress. Paying attention. These issues may affect your performance and productivity at work, school, and at home. Lack of sleep may also put you at higher risk for accidents,  suicide,and risky behaviors. If you do not get quality sleep you may also be at higher risk for several health problems, including: Infections. Type 2 diabetes. Heart disease. High blood pressure. Obesity. Worsening of long-term conditions, like arthritis, kidney disease, depression, Parkinson's disease, and epilepsy. What actions can I take to get more quality sleep?     Stick to a sleep schedule. Go to sleep and wake up at about the same time each day. Do not try to sleep less on weekdays and make up for lost sleep on weekends. This does not work. Try to get about 30 minutes of exercise on most days. Do not exercise 2-3 hours before going to bed. Limit naps during the day to 30 minutes or less. Do not use any products that contain nicotine or tobacco, such as cigarettes or e-cigarettes. If you need help quitting, ask your health care provider. Do not drink caffeinated beverages for at least 8 hours before going to bed. Coffee, tea, and some sodas contain caffeine. Do not drink alcohol close to bedtime. Do not eat large meals close to bedtime. Do not take naps in the late afternoon. Try to get at least 30 minutes of sunlight every day. Morning sunlight is best. Make time to relax before bed. Reading, listening to music, or taking a hot bath promotes quality sleep. Make your bedroom a place that promotes quality sleep. Keep your bedroom dark, quiet, and at a comfortable room temperature. Make sure your bed is comfortable. Take out sleep distractions like TV, a computer, smartphone,  and bright lights. If you are lying awake in bed for longer than 20 minutes, get up and do a relaxing activity until you feel sleepy. Work with your health care provider to treat medical conditions that may affect sleeping, such as: Nasal obstruction. Snoring. Sleep apnea and other sleep disorders. Talk to your health care provider if you think any of your prescription medicines may cause you to have difficulty  falling or staying asleep. If you have sleep problems, talk with a sleep consultant. If you think you have a sleep disorder, talk with your health care provider about getting evaluated by a specialist. Where to find more information National Sleep Foundation website: https://sleepfoundation.org National Heart, Lung, and Blood Institute (NHLBI): https://hall.info/.pdf Centers for Disease Control and Prevention (CDC): DetailSports.is Contact a health care provider if you: Have trouble getting to sleep or staying asleep. Often wake up very early in the morning and cannot get back to sleep. Have daytime sleepiness. Have daytime sleep attacks of suddenly falling asleep and sudden muscle weakness (narcolepsy). Have a tingling sensation in your legs with a strong urge to move your legs (restless legs syndrome). Stop breathing briefly during sleep (sleep apnea). Think you have a sleep disorder or are taking a medicine that is affecting your quality of sleep. Summary Most adults need 7-8 hours of quality sleep each night. Getting enough quality sleep is an important part of health and well-being. Make your bedroom a place that promotes quality sleep and avoid things that may cause you to have poor sleep, such as alcohol, caffeine, smoking, and large meals. Talk to your health care provider if you have trouble falling asleep or staying asleep. This information is not intended to replace advice given to you by your health care provider. Make sure you discuss any questions you have with your healthcare provider. Document Revised: 04/16/2017 Document Reviewed: 04/16/2017 Elsevier Patient Education  2022 [employer].

## 2020-07-11 ENCOUNTER — Other Ambulatory Visit: Payer: Self-pay | Admitting: Family Medicine

## 2020-07-11 ENCOUNTER — Other Ambulatory Visit: Payer: Self-pay | Admitting: Cardiology

## 2020-07-14 ENCOUNTER — Other Ambulatory Visit: Payer: Self-pay | Admitting: Family Medicine

## 2020-07-14 MED ORDER — HYDROCODONE-ACETAMINOPHEN 5-325 MG PO TABS: 1.0000 | ORAL_TABLET | Freq: Four times a day (QID) | ORAL | 0 refills | Status: DC | PRN

## 2020-07-14 NOTE — Telephone Encounter (Signed)
Ok to refill??  Last office visit 06/12/2020.  Last refill 06/15/2020.

## 2020-07-18 ENCOUNTER — Other Ambulatory Visit: Payer: Self-pay

## 2020-07-18 ENCOUNTER — Ambulatory Visit (INDEPENDENT_AMBULATORY_CARE_PROVIDER_SITE_OTHER): Payer: 59 | Admitting: Family Medicine

## 2020-07-18 ENCOUNTER — Encounter (INDEPENDENT_AMBULATORY_CARE_PROVIDER_SITE_OTHER): Payer: Self-pay | Admitting: Family Medicine

## 2020-07-18 VITALS — BP 167/99 | HR 93 | Temp 98.3°F | Ht 68.0 in | Wt >= 6400 oz

## 2020-07-18 DIAGNOSIS — E1165 Type 2 diabetes mellitus with hyperglycemia: Secondary | ICD-10-CM | POA: Diagnosis not present

## 2020-07-18 DIAGNOSIS — E785 Hyperlipidemia, unspecified: Secondary | ICD-10-CM

## 2020-07-18 DIAGNOSIS — Z9189 Other specified personal risk factors, not elsewhere classified: Secondary | ICD-10-CM

## 2020-07-18 DIAGNOSIS — Z6841 Body Mass Index (BMI) 40.0 and over, adult: Secondary | ICD-10-CM

## 2020-07-18 DIAGNOSIS — I10 Essential (primary) hypertension: Secondary | ICD-10-CM | POA: Diagnosis not present

## 2020-07-18 DIAGNOSIS — E1169 Type 2 diabetes mellitus with other specified complication: Secondary | ICD-10-CM

## 2020-07-18 DIAGNOSIS — E559 Vitamin D deficiency, unspecified: Secondary | ICD-10-CM | POA: Diagnosis not present

## 2020-07-18 DIAGNOSIS — D509 Iron deficiency anemia, unspecified: Secondary | ICD-10-CM

## 2020-07-18 MED ORDER — VITAMIN D (ERGOCALCIFEROL) 1.25 MG (50000 UNIT) PO CAPS: 50000.0000 [IU] | ORAL_CAPSULE | ORAL | 0 refills | Status: DC

## 2020-07-18 MED ORDER — ATORVASTATIN CALCIUM 10 MG PO TABS: 10.0000 mg | ORAL_TABLET | Freq: Every day | ORAL | 0 refills | Status: DC

## 2020-07-19 NOTE — Progress Notes (Signed)
Chief Complaint:   OBESITY Ricardo Lopez is here to discuss his progress with his obesity treatment plan along with follow-up of his obesity related diagnoses. Ricardo Lopez is on the Category 4 Plan and states he is following his eating plan approximately 75 to 100% of the time. Ricardo Lopez states he has not been exercising.  Today's visit was #: 1 Starting weight: 479 lbs Starting date: 05/18/2020 Today's weight: 487 lbs    Today's date: 07/19/2020 Total lbs lost to date: 0 Total lbs lost since last in-office visit: 0  Interim History: Ricardo Lopez returns to the clinic from 05/18/20. The [place] sausage took time to get used to. Ricardo Lopez did have pneumonia at the time of his first follow up. He is also interested in fruit substitutions. Ricardo Lopez was put on prednisone for a bit for pneumonia. He is interested in ways to substitute pasta in. He does not have plans for 4th of July.  Subjective:   1. Vitamin D deficiency He is currently taking no vitamin D supplement. His last vitamin D level was 28.8. He notes fatigue and denies nausea, vomiting or muscle weakness.  Lab Results  Component Value Date   VD25OH 28.8 (L) 05/18/2020   2. Essential Hypertension [name] reports increased chest pain today. He took the medications as prescribed.  BP Readings from Last 3 Encounters:  07/18/20 (!) 167/99  07/04/20 128/78  06/12/20 (!) 148/82   Lab Results  Component Value Date   CREATININE 0.98 05/18/2020   CREATININE 1.13 05/01/2020   CREATININE 0.97 12/31/2019    3. Type 2 diabetes mellitus with hyperglycemia, without long-term current use of insulin (HCC) [name] is on Trulicity 1.5 mg weekly. His Insulin level was 24.3 on 05/18/20. He denies feelings of hypoglycemia.  Lab Results  Component Value Date   HGBA1C 6.2 (H) 05/18/2020   HGBA1C 7.1 (H) 10/19/2019   HGBA1C 6.2 (H) 02/28/2015   Lab Results  Component Value Date   LDLCALC 145 (H) 05/18/2020   CREATININE 0.98 05/18/2020   Lab Results   Component Value Date   INSULIN 24.3 05/18/2020    4. Microcytic anemia Arbor voices that his son has the sickle cell trait. His iron level was normal, ferritin level was normal, but his RDW was elevated and the MCV was decreased.  CBC Latest Ref Rng & Units 05/18/2020 12/31/2019 12/21/2019  WBC 3.4 - 10.8 x10E3/uL 8.8 7.8 6.5  Hemoglobin 13.0 - 17.7 g/dL 10.2(L) 9.8(L) 8.8(L)  Hematocrit 37.5 - 51.0 % 34.9(L) 33.3(L) 28.9(L)  Platelets 140 - 400 Thousand/uL - 289 216   Lab Results  Component Value Date   IRON 58 05/18/2020   TIBC 309 05/18/2020   FERRITIN 125 05/18/2020   Lab Results  Component Value Date   VITAMINB12 603 05/18/2020    5. Hyperlipidemia associated with type 2 diabetes mellitus (HCC) Shaft has hyperlipidemia and is not on a statin. His LDL was 145, HDL was 53, triglycerides were 127, and total cholesterol was 221.He has been trying to improve his cholesterol levels with intensive lifestyle modification including a low saturated fat diet, exercise and weight loss. He denies any chest pain, claudication or myalgias.  Lab Results  Component Value Date   CHOL 221 (H) 05/18/2020   HDL 53 05/18/2020   LDLCALC 145 (H) 05/18/2020   TRIG 127 05/18/2020   Lab Results  Component Value Date   ALT 25 05/18/2020   AST 17 05/18/2020   ALKPHOS 92 05/18/2020   BILITOT <0.2 05/18/2020  The 10-year ASCVD risk score [name] DC Montez Hageman., et al., 2013) is: 19.4%   Values used to calculate the score:     Age: 45 years     Sex: Male     Is Non-Hispanic African American: Yes     Diabetic: Yes     Tobacco smoker: No     Systolic Blood Pressure: 167 mmHg     Is BP treated: Yes     HDL Cholesterol: 53 mg/dL     Total Cholesterol: 221 mg/dL   6. At risk for deficient knowledge of diabetes mellitus  Assessment/Plan:   1. Vitamin D deficiency Low Vitamin D level contributes to fatigue and are associated with obesity, breast, and colon cancer. He agrees to continue to take  prescription Vitamin D @50 ,000 IU every week and will follow-up for routine testing of Vitamin D, at least 2-3 times per year to avoid over-replacement.   - Vitamin D, Ergocalciferol, (DRISDOL) 1.25 MG (50000 UNIT) CAPS capsule; Take 1 capsule (50,000 Units total) by mouth every 7 (seven) days.  Dispense: 4 capsule; Refill: 0  2. Essential Hypertension Ricardo Lopez is working on healthy weight loss and exercise to improve blood pressure control. We will watch for signs of hypotension as he continues his lifestyle modifications. We will recheck his blood pressure at the next visit.  3. Type 2 diabetes mellitus with hyperglycemia, without long-term current use of insulin (HCC) Good blood sugar control is important to decrease the likelihood of diabetic complications such as nephropathy, neuropathy, limb loss, blindness, coronary artery disease, and death. Intensive lifestyle modification including diet, exercise and weight loss are the first line of treatment for diabetes.  Ricardo Lopez agrees to continue Trulicity without a dose change.  4. Microcytic anemia [name] likely has the sickle cell trait.  5. Hyperlipidemia associated with type 2 diabetes mellitus (HCC) Cardiovascular risk and specific lipid/LDL goals reviewed.  We discussed several lifestyle modifications today and Ricardo Lopez will continue to work on diet, exercise and weight loss efforts. Orders and follow up as documented in patient record.   Counseling Intensive lifestyle modifications are the first line treatment for this issue. Dietary changes: Increase soluble fiber. Decrease simple carbohydrates. Exercise changes: Moderate to vigorous-intensity aerobic activity 150 minutes per week if tolerated. Lipid-lowering medications: see documented in medical record.   - atorvastatin (LIPITOR) 10 MG tablet; Take 1 tablet (10 mg total) by mouth daily.  Dispense: 90 tablet; Refill: 0  6. At risk for deficient knowledge of diabetes mellitus Ricardo Lopez was  given approximately 15 minutes of diabetes education and counseling today. We discussed intensive lifestyle modifications today with an emphasis on weight loss as well as increasing exercise and decreasing simple carbohydrates in his diet. We also reviewed medication options with an emphasis on risk versus benefit of those discussed.    7. Obesity with current BMI of 74.05 Ricardo Lopez is currently in the action stage of change. As such, his goal is to continue with weight loss efforts. He has agreed to the Category 4 Plan + 200 calories.   Exercise goals: No exercise has been prescribed at this time.  Behavioral modification strategies: increasing lean protein intake, meal planning and cooking strategies, keeping healthy foods in the home, and planning for success.  Ilhan has agreed to follow-up with our clinic in 2 weeks. He was informed of the importance of frequent follow-up visits to maximize his success with intensive lifestyle modifications for his multiple health conditions.   Objective:   Blood pressure (!) 167/99, pulse  93, temperature 98.3 F (36.8 C), height 5\' 8"  (1.727 m), weight (!) 487 lb (220.9 kg), SpO2 98 %. Body mass index is 74.05 kg/m.  General: Cooperative, alert, well developed, in no acute distress. HEENT: Conjunctivae and lids unremarkable. Cardiovascular: Regular rhythm.  Lungs: Normal work of breathing. Neurologic: No focal deficits.   Lab Results  Component Value Date   CREATININE 0.98 05/18/2020   BUN 13 05/18/2020   NA 141 05/18/2020   K 4.4 05/18/2020   CL 99 05/18/2020   CO2 26 05/18/2020   Lab Results  Component Value Date   ALT 25 05/18/2020   AST 17 05/18/2020   ALKPHOS 92 05/18/2020   BILITOT <0.2 05/18/2020   Lab Results  Component Value Date   HGBA1C 6.2 (H) 05/18/2020   HGBA1C 7.1 (H) 10/19/2019   HGBA1C 6.2 (H) 02/28/2015   Lab Results  Component Value Date   INSULIN 24.3 05/18/2020   Lab Results  Component Value Date   TSH 4.680  (H) 05/18/2020   Lab Results  Component Value Date   CHOL 221 (H) 05/18/2020   HDL 53 05/18/2020   LDLCALC 145 (H) 05/18/2020   TRIG 127 05/18/2020   Lab Results  Component Value Date   VD25OH 28.8 (L) 05/18/2020   Lab Results  Component Value Date   WBC 8.8 05/18/2020   HGB 10.2 (L) 05/18/2020   HCT 34.9 (L) 05/18/2020   MCV 69 (L) 05/18/2020   PLT 289 12/31/2019   Lab Results  Component Value Date   IRON 58 05/18/2020   TIBC 309 05/18/2020   FERRITIN 125 05/18/2020   Attestation Statements:   Reviewed by clinician on day of visit: allergies, medications, problem list, medical history, surgical history, family history, social history, and previous encounter notes.  I04/30/2022, CMA, am acting as transcriptionist for Kirke Corin, MD  I have reviewed the above documentation for accuracy and completeness, and I agree with the above. - Reuben Likes, MD

## 2020-08-01 ENCOUNTER — Other Ambulatory Visit: Payer: Self-pay | Admitting: Family Medicine

## 2020-08-02 NOTE — Telephone Encounter (Signed)
Ok to refill??  Last office visit 06/12/2020.  Last refill 07/14/2020.

## 2020-08-03 MED ORDER — HYDROCODONE-ACETAMINOPHEN 5-325 MG PO TABS: 1.0000 | ORAL_TABLET | Freq: Four times a day (QID) | ORAL | 0 refills | Status: DC | PRN

## 2020-08-08 ENCOUNTER — Telehealth (INDEPENDENT_AMBULATORY_CARE_PROVIDER_SITE_OTHER): Payer: 59 | Admitting: Family Medicine

## 2020-08-08 ENCOUNTER — Encounter (INDEPENDENT_AMBULATORY_CARE_PROVIDER_SITE_OTHER): Payer: Self-pay | Admitting: Family Medicine

## 2020-08-08 ENCOUNTER — Other Ambulatory Visit: Payer: Self-pay

## 2020-08-08 DIAGNOSIS — G4733 Obstructive sleep apnea (adult) (pediatric): Secondary | ICD-10-CM | POA: Diagnosis not present

## 2020-08-08 DIAGNOSIS — Z6841 Body Mass Index (BMI) 40.0 and over, adult: Secondary | ICD-10-CM | POA: Diagnosis not present

## 2020-08-08 DIAGNOSIS — E559 Vitamin D deficiency, unspecified: Secondary | ICD-10-CM

## 2020-08-08 MED ORDER — VITAMIN D (ERGOCALCIFEROL) 1.25 MG (50000 UNIT) PO CAPS: 50000.0000 [IU] | ORAL_CAPSULE | ORAL | 0 refills | Status: DC

## 2020-08-15 ENCOUNTER — Telehealth: Payer: Self-pay

## 2020-08-15 NOTE — Progress Notes (Signed)
TeleHealth Visit:  Due to the COVID-19 pandemic, this visit was completed with telemedicine (audio/video) technology to reduce patient and provider exposure as well as to preserve personal protective equipment.   Ricardo Lopez has verbally consented to this TeleHealth visit. The patient is located at home, the provider is located at the [facility] office. The participants in this visit include the listed provider and patient. The visit was conducted today via video.   Chief Complaint: OBESITY Ricardo Lopez is here to discuss his progress with his obesity treatment plan along with follow-up of his obesity related diagnoses. Ricardo Lopez is on the Category 4 Plan + 200 calories and states he is following his eating plan approximately 100% of the time. Ricardo Lopez states he is not currently exercising.  Today's visit was #: 3 Starting weight: 479 lbs Starting date: 05/18/2020  Interim History: Ricardo Lopez has had a significant amount of stress. His wife had a heart attack, which required stent placement. Food wise, he is following category 4 almost 100% of the time. He reports occasional hunger which he satisfies by eating string cheese and peaches. His biggest obstacles is finding green tea to drink.  Subjective:   1. Obstructive sleep apnea syndrome Ricardo Lopez is still fighting to get BiPap. He calls once a week and machine is still on backorder.  2. Vitamin D deficiency He denies nausea, vomiting, and muscle weakness but notes fatigue. Pt is on prescription Vit D.  Assessment/Plan:   1. Obstructive sleep apnea syndrome Intensive lifestyle modifications are the first line treatment for this issue. We discussed several lifestyle modifications today and he will continue to work on diet, exercise and weight loss efforts. We will continue to monitor. Orders and follow up as documented in patient record. Follow up at next appt.  2. Vitamin D deficiency Low Vitamin D level contributes to fatigue and are  associated with obesity, breast, and colon cancer. He agrees to continue to take prescription Vitamin D @50 ,000 IU every week and will follow-up for routine testing of Vitamin D, at least 2-3 times per year to avoid over-replacement.  Refill- Vitamin D, Ergocalciferol, (DRISDOL) 1.25 MG (50000 UNIT) CAPS capsule; Take 1 capsule (50,000 Units total) by mouth every 7 (seven) days.  Dispense: 12 capsule; Refill: 0  3. Obesity with current BMI of 74.05  Ricardo Lopez is currently in the action stage of change. As such, his goal is to continue with weight loss efforts. He has agreed to the Category 4 Plan + 200 calories.   Exercise goals:  As is  Behavioral modification strategies: increasing lean protein intake, increasing water intake, meal planning and cooking strategies, and keeping healthy foods in the home.  Ricardo Lopez has agreed to follow-up with our clinic in 2-3 weeks. He was informed of the importance of frequent follow-up visits to maximize his success with intensive lifestyle modifications for his multiple health conditions.  Objective:   VITALS: Per patient if applicable, see vitals. GENERAL: Alert and in no acute distress. CARDIOPULMONARY: No increased WOB. Speaking in clear sentences.  PSYCH: Pleasant and cooperative. Speech normal rate and rhythm. Affect is appropriate. Insight and judgement are appropriate. Attention is focused, linear, and appropriate.  NEURO: Oriented as arrived to appointment on time with no prompting.   Lab Results  Component Value Date   CREATININE 0.98 05/18/2020   BUN 13 05/18/2020   NA 141 05/18/2020   K 4.4 05/18/2020   CL 99 05/18/2020   CO2 26 05/18/2020   Lab Results  Component  Value Date   ALT 25 05/18/2020   AST 17 05/18/2020   ALKPHOS 92 05/18/2020   BILITOT <0.2 05/18/2020   Lab Results  Component Value Date   HGBA1C 6.2 (H) 05/18/2020   HGBA1C 7.1 (H) 10/19/2019   HGBA1C 6.2 (H) 02/28/2015   Lab Results  Component Value Date   INSULIN  24.3 05/18/2020   Lab Results  Component Value Date   TSH 4.680 (H) 05/18/2020   Lab Results  Component Value Date   CHOL 221 (H) 05/18/2020   HDL 53 05/18/2020   LDLCALC 145 (H) 05/18/2020   TRIG 127 05/18/2020   Lab Results  Component Value Date   VD25OH 28.8 (L) 05/18/2020   Lab Results  Component Value Date   WBC 8.8 05/18/2020   HGB 10.2 (L) 05/18/2020   HCT 34.9 (L) 05/18/2020   MCV 69 (L) 05/18/2020   PLT 289 12/31/2019   Lab Results  Component Value Date   IRON 58 05/18/2020   TIBC 309 05/18/2020   FERRITIN 125 05/18/2020    Attestation Statements:   Reviewed by clinician on day of visit: allergies, medications, problem list, medical history, surgical history, family history, social history, and previous encounter notes.  Edmund Hilda, CMA, am acting as transcriptionist for Reuben Likes, MD.   I have reviewed the above documentation for accuracy and completeness, and I agree with the above. - Reuben Likes, MD

## 2020-08-18 NOTE — Telephone Encounter (Signed)
Closing encounter

## 2020-08-23 ENCOUNTER — Other Ambulatory Visit: Payer: Self-pay | Admitting: Family Medicine

## 2020-08-25 ENCOUNTER — Telehealth: Payer: Self-pay | Admitting: *Deleted

## 2020-08-25 NOTE — Telephone Encounter (Signed)
Staff message sent to [facility] per note received from [employer] the patient needs a sleep compliance visit by 11/13/20.

## 2020-08-29 ENCOUNTER — Other Ambulatory Visit: Payer: Self-pay | Admitting: Family Medicine

## 2020-08-29 MED ORDER — HYDROCODONE-ACETAMINOPHEN 5-325 MG PO TABS: 1.0000 | ORAL_TABLET | Freq: Four times a day (QID) | ORAL | 0 refills | Status: DC | PRN

## 2020-08-29 NOTE — Telephone Encounter (Signed)
Ok to refill??  Last office visit 06/12/2020.  Last refill 08/03/2020.

## 2020-09-05 ENCOUNTER — Other Ambulatory Visit: Payer: Self-pay | Admitting: Family Medicine

## 2020-09-06 NOTE — Telephone Encounter (Signed)
Ok to refill Ambien??  Last office visit 06/12/2020.  Last refill 05/01/2020,#3 refills.

## 2020-09-18 ENCOUNTER — Other Ambulatory Visit: Payer: Self-pay | Admitting: Family Medicine

## 2020-09-18 MED ORDER — HYDROCODONE-ACETAMINOPHEN 5-325 MG PO TABS: 1.0000 | ORAL_TABLET | Freq: Four times a day (QID) | ORAL | 0 refills | Status: DC | PRN

## 2020-09-18 NOTE — Telephone Encounter (Signed)
Ok to refill??  Last office visit 06/12/2020.  Last refill 08/29/2020.

## 2020-10-02 ENCOUNTER — Encounter: Payer: Self-pay | Admitting: Family Medicine

## 2020-10-02 ENCOUNTER — Other Ambulatory Visit: Payer: Self-pay

## 2020-10-02 ENCOUNTER — Ambulatory Visit (INDEPENDENT_AMBULATORY_CARE_PROVIDER_SITE_OTHER): Payer: 59 | Admitting: Family Medicine

## 2020-10-02 VITALS — BP 142/84 | HR 92 | Temp 98.1°F | Resp 20 | Ht 68.0 in

## 2020-10-02 DIAGNOSIS — F331 Major depressive disorder, recurrent, moderate: Secondary | ICD-10-CM

## 2020-10-02 DIAGNOSIS — E1165 Type 2 diabetes mellitus with hyperglycemia: Secondary | ICD-10-CM | POA: Diagnosis not present

## 2020-10-02 DIAGNOSIS — Z6841 Body Mass Index (BMI) 40.0 and over, adult: Secondary | ICD-10-CM

## 2020-10-02 DIAGNOSIS — M545 Low back pain, unspecified: Secondary | ICD-10-CM | POA: Diagnosis not present

## 2020-10-02 DIAGNOSIS — J9611 Chronic respiratory failure with hypoxia: Secondary | ICD-10-CM | POA: Diagnosis not present

## 2020-10-02 NOTE — Progress Notes (Signed)
Subjective:    Patient ID: Ricardo Lopez., male    DOB: [birth year], 45 y.o.   MRN: [id number]   Patient was admitted in September for COVID pneumonia.  Subsequently readmitted for E.coli bacteremia ultimately determined to be due to a perirectal infection.  See most recent discharge summary: Admit date: 10/30/2019 Discharge date: 11/03/2019   Admitted From: Home Disposition: Home   Recommendations for Outpatient Follow-up:  Follow up with PCP in 1-2 weeks Please obtain BMP/CBC in one week Follow-up with general surgery as needed He has been referred to outpatient physical therapy for wound care   Home Health: Home health RN Equipment/Devices:   Discharge Condition: Stable CODE STATUS: Full code Diet recommendation: Heart healthy   Brief/Interim Summary: 45 year old male with a history of hypertension, morbid obesity, diabetes mellitus type 2 presenting with increasing pain and drainage from his buttocks.  The patient was recently admitted to the hospital from 10/19/2019 to 10/22/2019 secondary to respiratory failure as well as E. coli bacteremia.  The patient was discharged home with cefdinir which he finished on 10/30/2019.  It was felt that his respiratory failure was likely secondary to complications from his [id number] pneumonia in the setting of morbid obesity and OHS.  He was discharged home on 2 L nasal cannula.  In addition, the patient was also recently discharged after a stay from 09/29/2019 to 10/07/2019 after treatment for a COVID-19 pneumonia. The patient states that he had some gluteal pain even before he left the hospital on 10/22/2019.  However, he has noted increasing edema and erythema and pain for the last 3 to 4 days.  He is also noted some purulent drainage.  He denied any fevers, chills, chest pain, shortness breath, cough, hemoptysis, vomiting, diarrhea, abdominal pain. In the emergency department, the patient had low-grade temperature of 9 9.1 F.  He was mildly  tachycardic up to 110.  He was hemodynamically stable.  Oxygen saturation was 97% on room air.  The patient was started on vancomycin and Zosyn.  General surgery was consulted to assist with management.   Discharge Diagnoses:  Principal Problem:   Abscess and cellulitis of gluteal region Active Problems:   Severe obesity (BMI >= 40) (HCC)   Acute respiratory failure with hypoxia due to Covid    Microcytic anemia   Sepsis due to undetermined organism ([place])   Chronic respiratory failure with hypoxia (HCC)   Sepsis -Present on admission -Secondary to gluteal abscess -Presented with tachycardia and WBC up to 16.9 -Lactic acid 1.6 -Follow cultures--VRE+Ecoli -Continue vancomycin and Zosyn   Gluteal abscess -Appreciate surgical consult -Case discussed with Dr. Alexandria Lodge suspicion of fistula at this time--> treat as gluteal abscess with outpatient follow-up and consideration for MRI as an outpatient if clinically worsens or no improvement -Continue vancomycin and Zosyn -WBC decreasing 16.9>> 14.0>>10.8 -NON-surgical setting cultures = VRE and Ecoli -Doubt VRE is true pathogen as patient is improving and WBC improving without active tx against VRE -Treated with vancomycin/Zosyn -Since he is clinically improved, antibiotics transition to doxycycline/Augmentin -He has been referred for outpatient wound care with physical therapy -will need HHRN to help with dressing changes   Controlled diabetes mellitus type 2 -10/19/2019 hemoglobin A1c 7.1 -Novo[employer] sliding scale   Morbid obesity -BMI 60.82 -Lifestyle modification   Chronic respiratory failure with hypoxia -Stable on 2 L nasal cannula -Encourage incentive spirometry   Iron deficiency anemia -10/20/2019 iron saturation 7% -Ferritin 289 -start iron once infx resolved, as an outpatient  11/09/19 Patient  is here today for follow-up.  He has seen wound care every week.  They have appointment to see him tomorrow.  He states that  the wound is improving on his gluteal area and that the pain is getting better.  He denies any fever.  He denies any chills.  He denies any nausea or vomiting.  He does have significant pain in the gluteal area especially when he sits which is primarily the majority of the day.  He is still on oxygen 2 L via nasal cannula.  He is 97% on 2 L but if he takes his oxygen off it drops to 89% even at rest.  He denies any chest pain however he does have shortness of breath with minimal activity.  He also continues to have a nonproductive cough.  Fortunately his lungs sound relatively clear on examination today.  We reviewed his lab work and we discussed his diabetes diagnosis that he was unaware of.  We discussed dietary changes.  We also mentioned that he is severely anemic and that his iron level was low.  I recommended starting iron sulfate 324 mg daily.  At the present time he is not taking any iron supplement.  He denies any pleurisy he does have fibrotic changes in both legs consistent with chronic lymphedema.  He does have edema in both legs to the knees but there is no evidence of a DVT or PE on his exam today.  At that time, my plan was: Discontinue tramadol and replace with Norco 5/325 1-2 p.o. every 6 hours as needed pain.  He can also use Tussionex 1 teaspoon every 12 hours as needed for cough.  Clinically his cellulitis is improving the gluteal region.  He has no signs of systemic illness or bacteremia such as fever, etc.  I will repeat a blood culture today and also check a CBC to monitor for any leukocytosis.  Complete Augmentin and doxycycline as prescribed.  Patient is seeing wound clinic for wound checks and he has an appointment tomorrow.  Repeat chest x-ray in 2 weeks and see the patient back in 2 weeks to reassess weaning off oxygen.  At the present time he is not ready to do so.  At that point I will give the patient a flu shot.  Would recommend a Covid vaccine 90 days after his infection.  Discussed  diabetes and recommended a low carbohydrate diet.  11/23/19 Patient is here today for a checkup.  His oxygen is 93% on room air.  However he states that when he is up walking around, it will drop into the mid 80s.  He does report some pleurisy with deep inspiration and he still has a resting tachycardia of 104 to 108 bpm.  He denies any hemoptysis or purulent sputum.  His cough is getting better.  Chest x-ray looks considerably better compared to the chest x-ray from September 28 although it does have some residual opacities which may reflect scar tissue.  He is due to recheck his CBC to monitor his anemia.  He is on iron tablet.  He reports that he is not sleeping.  He states that he can only get about 2 hours of sleep to 3 hours of sleep and then he is wide-awake.  There is no particular symptom that is keeping him awake is just that he is unable to rest.  He also continues to have pain around the the healing wound on his right gluteus.  I examined the wound today with  his wife.  She is doing an excellent job taking care of the wound.  There is a circular patch of granulation tissue roughly 5 cm x 3 cm.  There is no ulcer or cavity.  It is filled in almost completely.  It is still tender to the touch.  There is no surrounding erythema or warmth to suggest cellulitis.  There is no palpable fluctuance.  The wound appears to be healing well.  He is still seeing wound care.  At that time, my plan was: Given his tachycardia and pleurisy and shortness of breath I will check a D-dimer to evaluate for possible pulmonary embolism given his recent Covid.  If D-dimer is positive I would recommend a CT angiogram of the chest.  Recheck CBC to monitor his anemia.  Also recheck a CMP to monitor his glucose and kidney function.  However I am pleased by his breath sounds today and the improvement on his chest x-ray.  I hope that over the next 3 to 4 weeks he will be able to wean off oxygen completely.  I will give him Ambien 10  mg p.o. nightly as needed insomnia but I encouraged him to try to wean away from the pain medication and not to take the pain medication at the same time as the Ambien.  Also gave the patient compression hose prescription for knee-high compression hose 15 to 20 mmHg to help with the lymphedema in his legs.  As a side note, the patient reports numbness and tingling in both hands distal to the wrist with pain in his fingers.  I suspect carpal tunnel syndrome.  We discussed nerve conduction studies but we will defer that at the present time.  12/21/19 Patient has rapidly gained weight.  His weight is up over 50 pounds since I last saw him.  His legs are swollen and tight and tender to palpation.  He also reports shortness of breath with activity.  He had a positive D-dimer at his last visit so we did obtain a CT scan but no pulmonary embolism was found only residual inflammation in the lungs from his Covid pneumonia.  However I am concerned about right-sided heart failure due to this as well as due to his morbid obesity.  I believe the patient likely has diastolic heart failure even though he had a normal echocardiogram in September 29.  He denies any chest pain.  He does have some orthopnea.  He also reports depression and anxiety.  He has having trouble sleeping.  He reports panic attacks.  He states that if his wife leaves him alone he panics.  She states that if he wakes up and she is not in the home he will panic.  At that time, my plan was: Patient clearly is fluid overloaded.  I believe the patient has acute diastolic heart failure or right-sided heart failure due to his underlying lung damage.  Begin Lasix 40 mg twice daily along with K-Dur 20 milliequivalents daily.  Check BNP, CBC, CMP.  Reassess weight and renal function in 1 week and titrate diuretics to achieve the desired effect.  Meanwhile start Lexapro for GAD 10 mg a day.  12/31/19 Patient has diuresed and lost over 20 pounds of fluid.  The  swelling in his legs is better although it is still very prominent.  His breathing is slightly better.  He states however if he sits without oxygen his oxygen will drop to 90% on room air.  He denies any cramping.  He denies any dizziness.  He denies any lightheadedness however he does continue to have dyspnea on exertion and easy fatigability.  He continues to have severe pain in his ankles and in his legs with ambulation.  He is using hydrocodone 1-2 times a day.  The Lexapro was not helping with panic attacks however he is only been on the medication for about 10 days.  I explained to him that this is to be expected and that we need to be patient.  At that time, my plan was: I believe the patient has diastolic heart failure.  He may also have right-sided heart failure.  I believe is due to a combination of morbid obesity, scarring in the lungs due to Covid, untreated obstructive sleep apnea.  Therefore continue Lasix 40 mg twice daily.  Check a CMP to monitor electrolytes to determine if we need to increase potassium supplementation or decrease diuretic.  I would like to see the patient lose an additional 20 pounds of fluid.  Therefore I would like to recheck via telephone in 1 week assuming his electrolytes are normal.  However I believe part of his issue could be his tachycardia.  The tachycardia decreases filling time for the heart and if he does have a stiff heart consistent with diastolic dysfunction this only exacerbates his decreased cardiac output.  Therefore I will start the patient on metoprolol XL 25 mg a day in an effort to try to slow his tachycardia and improve filling time to increase cardiac output.  I will also consult cardiology for any additional recommendations to manage diastolic dysfunction.  I did refill his hydrocodone 5/325 1 tablet twice daily 60 tablets/month.  Reassess the patient via telephone in 1 week.  Try to achieve an additional 20 pounds of diuresis assuming electrolytes are  normal  05/08/20 Patient has gained over 50 pounds since I last saw him!.  While some of this is certainly swelling I believe most of this is likely obesity.  He admits to overeating.  He admits that he has emotional issues and that "I have a problem".  He is clearly depressed.  He reports daily anxiety.  He reports anhedonia.  He reports depression.  Lexapro is not helping.  He is also asking to increase his painkillers.  He is taking 2 a day for 65-monthtotal.  He is extremely sedentary.  I declined.  I stated that I feel that that would lead to a problem.  Furthermore, I feel that this is pointing to habituation and dependency.  I was clear with the patient.  I stated that we need to make serious changes or else his life expectancy is going to be drastically reduced.  We need to increase his activity, change his dietary habits, achieve significant weight loss.  All of this is necessary in order to prolong his life and improve his quality of life.  He agrees.  He has an appointment to see weight management coming up.  Cardiology just increased his fluid pills.  He does report symptoms of a sinus infection.  He also reports pain in his right gluteus.  It is posterior to the greater trochanter.  That area is not indurated or firm or red.  There is no palpable abnormality.  There is no palpable abscess.  There is no visible deformity.  Is been tender in that area for approximately a week.  Range of motion in the hip does not seem to exacerbate it.  Differential diagnosis includes musculoskeletal pain.  At that time, my plan was: First, I will try to address his depression.  We increase Lexapro to 20 mg a day and added Wellbutrin extended release 150 mg p.o. every morning as an adjunctive therapy.  Reassess in 4 weeks.  Second I declined to increase his hydrocodone due to concerns over habituation and dependency.  Third we need to address his morbid obesity.  I certainly support him seeing weight management.  We  also discussed options and I gave him samples of Trulicity 2.92 mg subcu weekly.  In 2 weeks if tolerating, he will increase to 1.5 mg subcu weekly thereafter.  Also explained to the patient that he needs to make drastic lifestyle changes in order to prevent early mortality and reduce his morbidity.  Therefore I have strongly encouraged exercise, weight loss, and dietary changes.  Obtain chest x-ray as he continues to report pleurisy and shortness of breath however his pulmonary exam today is excellent and I feel the majority of the shortness of breath at this point is due to morbid obesity and deconditioning.  06/06/20 Patient is appointment today was supposed to be a follow-up.  However, he is having worsening cough and breathing issues.  He saw my partner earlier this week and was diagnosed with possible allergies.  However, the patient has a low-grade temperature today of 99.6.  His oxygen was 86% on 2 L when he walked in.  He was tachycardic and short of breath.  On exam today, he has right basilar crackles and diffuse expiratory wheezing all throughout his lungs.  I believe that he is developing community-acquired pneumonia in the right lung and reactive airway disease causing shortness of breath.  On a positive note, he has lost almost 20 pounds since starting Trulicity.  I congratulated him on this.  He also feels some better since starting Wellbutrin.  However today we focused mainly on with his breathing due to the fevers and shortness of breath. At that time, my plan was: Patient I believe may be developing community-acquired pneumonia.  Begin Levaquin 500 mg p.o. daily for 7 days.  Due to the reactive airway disease begin a prednisone taper pack and start using albuterol 2 puffs inhaled every 6 hours.  Reassess later this week if no better or sooner if worsening.  I congratulated the patient on his weight loss and will continue Trulicity 1.5 mg subcu weekly in addition to the weight loss management  center.  Depression seems somewhat better on the combination of the Lexapro and the Wellbutrin so we will continue this for now.  Requested the patient to get a chest x-ray as soon as possible  06/12/20 Patient's lungs sound much better today.  He is 97% on his oxygen.  Last week he was 86%.  His lungs sound much clearer today.  The crackles have dissipated at the bases of his lungs per tickly on his right side.  His breath sounds are clear now.  He also has no further rhonchi or wheezing.  He states that his breathing is much better.  He just finished the prednisone today.  Since his breathing is better, we spent more time today discussing his depression.  He states that the depression has improved.  The anhedonia has improved.  The hopelessness has improved.  However both he and his wife report that he is having nightmares almost on a nightly basis.  He will wake up having terrible dreams.  Some of the dreams are related to his time in the  hospital with [facility].  Some of the dreams are that he has lost his family.  However I believe the nightmares on a daily basis are certainly disrupting his sleep and seem to be very troublesome to him.  I question if the patient may be dealing with PTSD as he certainly battled a life-threatening illness and has not recovered.  I can help but believe that this has him questioning his mortality and contributes dramatically to his depression.  We discussed prazosin for nightmares and he is certainly interested he is tolerating Trulicity 1.5 mg weekly.  He states that his joints feel much better after taking prednisone however I explained the long-term risk of prednisone and that this is not a viable option.  However he is not currently taking any NSAIDs.  He states that he tried ibuprofen and Aleve with no relief so he stopped them.  At that time, my plan was: Clinically, the patient seems to have recovered nicely from his pneumonia.  He is finished his Levaquin and his  prednisone.  No further treatment is necessary for this.  Regarding his depression, he seems to be doing well on the combination of Lexapro and Wellbutrin.  We discussed adding prazosin for PTSD related nightmares and he is interested in trying it.  Therefore we will start prazosin 2 mg at night.  Regarding his diffuse joint pain and body pain we will try adding meloxicam 15 mg daily as well.  I recommended the patient adjust to this first and if he is doing well in a couple weeks we can try increasing his Trulicity to 3 mg a day to assist in weight loss.  10/02/20 Patient presents today with several concerns.  Since I last saw him, he has rolled out of bed twice and landed on the floor.  His wife thinks that he is trying to get up to go to the bathroom and he is not fully awake.  He is wearing BiPAP for sleep apnea.  He is also fallen once while walking on the driveway.  Since I last saw him, he has gained more than 70 pounds.  Our scales will go up to 550 pounds today.  However our scales are unable to register a weight for this patient.  Therefore he has gained more than 70 pounds in the last 3 months!  He states that he is using Trulicity and following his diet.  However he also reports being extremely depressed.  Since I last saw the patient, his wife suffered a heart attack.  Patient states that the Lexapro and the Wellbutrin are no longer helping.  He reports nightmares at night that the prazosin is not helping with.  He is trying to exercise as much as his body will allow him to and he is already met with a nutritionist who drafted a diet for him that he is reportedly following however has still gained more than 70 pounds Past Medical History:  Diagnosis Date   Anemia    Anxiety and depression    B12 deficiency    Back pain    COVID    9/21- hypoxia requiring oxygen   Diabetes mellitus without complication (HCC)    Fluid retention    Frequency of urination    GERD (gastroesophageal reflux  disease)    Gluteal abscess    ecoli after covid hospitalization 2021   Headache    Hypertension    Hypoxia    Joint pain    Muscle pain  Nervousness    Obesity    Other fatigue    Sleep apnea    SOB (shortness of breath) on exertion    Stomach ulcer    Tachycardia    Thoracic aortic aneurysm (HCC)    Weakness    Past Surgical History:  Procedure Laterality Date   None     Current Outpatient Medications on File Prior to Visit  Medication Sig Dispense Refill   acetaminophen (TYLENOL) 325 MG tablet Take 650 mg by mouth every 6 (six) hours as needed.     albuterol (VENTOLIN HFA) 108 (90 Base) MCG/ACT inhaler Inhale 2 puffs into the lungs every 4 (four) hours as needed for wheezing or shortness of breath. 18 g 2   ascorbic acid (VITAMIN C) 500 MG tablet Take 1 tablet (500 mg total) by mouth daily. 30 tablet 1   atorvastatin (LIPITOR) 10 MG tablet Take 1 tablet (10 mg total) by mouth daily. 90 tablet 0   benzonatate (TESSALON) 100 MG capsule Take 1 capsule (100 mg total) by mouth 2 (two) times daily as needed for cough. Take first dose at night time and monitor for drowsiness.  Do not take at the same time as other medications that can make you drowsy.  If this medicine makes you drowsy, do not take while driving or operating heavy machinery. 20 capsule 0   buPROPion (WELLBUTRIN XL) 150 MG 24 hr tablet Take 1 tablet (150 mg total) by mouth daily. 30 tablet 5   cetirizine (ZYRTEC) 10 MG tablet Take 1 tablet (10 mg total) by mouth daily. 30 tablet 2   chlorpheniramine-HYDROcodone (TUSSIONEX PENNKINETIC ER) 10-8 MG/5ML SUER Take 5 mLs by mouth every 12 (twelve) hours as needed for cough. 115 mL 0   Dulaglutide (TRULICITY) 1.5 JO/8.4ZY SOPN Inject 1.5 mg into the skin once a week. 3 mL 3   escitalopram (LEXAPRO) 20 MG tablet Take 1 tablet (20 mg total) by mouth daily. 30 tablet 5   FEROSUL 325 (65 Fe) MG tablet TAKE 1 TABLET BY MOUTH EVERY MORNING 30 tablet 3   fluticasone (FLONASE) 50  MCG/ACT nasal spray Place 2 sprays into both nostrils daily. 16 g 6   furosemide (LASIX) 80 MG tablet Take 1 tablet (80 mg total) by mouth 2 (two) times daily. 180 tablet 3   HYDROcodone-acetaminophen (NORCO) 5-325 MG tablet Take 1 tablet by mouth every 6 (six) hours as needed for moderate pain. 60 tablet 0   meloxicam (MOBIC) 15 MG tablet TAKE 1 TABLET(15 MG) BY MOUTH DAILY 30 tablet 2   metoprolol succinate (TOPROL-XL) 100 MG 24 hr tablet Take 1 tablet (100 mg total) by mouth daily. 30 tablet 6   metoprolol succinate (TOPROL-XL) 100 MG 24 hr tablet Take 1 tablet (100 mg total) by mouth daily. 90 tablet 2   Multiple Vitamin (MULTIVITAMIN) tablet Take 1 tablet by mouth daily.     potassium chloride SA (KLOR-CON) 20 MEQ tablet Take 2 tablets (40 mEq total) by mouth daily. 180 tablet 3   prazosin (MINIPRESS) 2 MG capsule TAKE 1 CAPSULE(2 MG) BY MOUTH AT BEDTIME 30 capsule 2   sodium chloride (OCEAN) 0.65 % SOLN nasal spray Place 1 spray into both nostrils as needed for congestion. 88 mL 0   Vitamin D, Ergocalciferol, (DRISDOL) 1.25 MG (50000 UNIT) CAPS capsule Take 1 capsule (50,000 Units total) by mouth every 7 (seven) days. 12 capsule 0   zolpidem (AMBIEN) 10 MG tablet TAKE 1 TABLET(10 MG) BY MOUTH AT BEDTIME AS  NEEDED FOR SLEEP 30 tablet 3   No current facility-administered medications on file prior to visit.   No Known Allergies Social History   Socioeconomic History   Marital status: Married    Spouse name: Not on file   Number of children: Not on file   Years of education: Not on file   Highest education level: Not on file  Occupational History   Occupation: stay at home spouse  Tobacco Use   Smoking status: Never   Smokeless tobacco: Never  Vaping Use   Vaping Use: Never used  Substance and Sexual Activity   Alcohol use: Yes    Comment: occasional   Drug use: No   Sexual activity: Yes  Other Topics Concern   Not on file  Social History Narrative   Lives at home with wife  and two children.  Four children in all.     Social Determinants of Health   Financial Resource Strain: Not on file  Food Insecurity: Not on file  Transportation Needs: Not on file  Physical Activity: Not on file  Stress: Not on file  Social Connections: Not on file  Intimate Partner Violence: Not on file    Review of Systems  All other systems reviewed and are negative.     Objective:   Physical Exam Vitals reviewed.  Constitutional:      Appearance: He is obese.  Cardiovascular:     Rate and Rhythm: Normal rate and regular rhythm.     Heart sounds: Normal heart sounds.  Pulmonary:     Effort: Pulmonary effort is normal. No respiratory distress.     Breath sounds: Normal breath sounds. No stridor. No wheezing, rhonchi or rales.  Musculoskeletal:        General: Deformity present.     Right lower leg: Edema present.     Left lower leg: Edema present.  Neurological:     Mental Status: He is alert.   Wt Readings from Last 3 Encounters:  07/18/20 (!) 487 lb (220.9 kg)  07/04/20 (!) 493 lb (223.6 kg)  06/12/20 (!) 470 lb (213.2 kg)          Assessment & Plan:  Acute midline low back pain, unspecified whether sciatica present - Plan: DG Lumbar Spine Complete  Type 2 diabetes mellitus with hyperglycemia, without long-term current use of insulin (HCC) - Plan: Hemoglobin A1c, CBC with Differential/Platelet, COMPLETE METABOLIC PANEL WITH GFR, Hemoglobin A1c, Amb Referral to Bariatric Surgery  Obesity with current BMI of 74.05 - Plan: Amb Referral to Bariatric Surgery, Ambulatory referral to Psychiatry  Chronic respiratory failure with hypoxia ([place]) - Plan: Amb Referral to Bariatric Surgery  MDD (major depressive disorder), recurrent episode, moderate ([place]) - Plan: Ambulatory referral to Psychiatry Patient has gained more than 70 pounds in 3 months.  This is despite following a diet drafted by nutritionist and being on Trulicity.  I feel that this has to be a psychiatric  issue.  I believe that the patient is eating and consuming excessive calories out of severe depression.  As result no weight loss attempt is going to be successful until we manage his depression.  Lexapro and Wellbutrin are not effective.  I believe that the patient would benefit from seeing a psychiatrist as this is becoming a life-threatening issue regarding his weight.  I also have made a referral to a bariatric surgery clinic.  I believe that the patient's life depends on him losing weight and I explained this to him bluntly.  He is quickly approaching a point where there will be no return.  He is slowly getting to the point where he is immobile and at that point I feel that his weight will spiral even further out of control.  Meanwhile check an x-ray of his lower back as he is complaining of midline back pain after his fall to rule out a vertebral fracture.  I will also obtain baseline lab work and plan to press the Trulicity to 3 mg weekly unless contraindicated

## 2020-10-04 ENCOUNTER — Other Ambulatory Visit: Payer: Self-pay | Admitting: Family Medicine

## 2020-10-04 ENCOUNTER — Telehealth: Payer: Self-pay | Admitting: *Deleted

## 2020-10-04 ENCOUNTER — Other Ambulatory Visit: Payer: Self-pay | Admitting: *Deleted

## 2020-10-04 DIAGNOSIS — K921 Melena: Secondary | ICD-10-CM

## 2020-10-04 DIAGNOSIS — D649 Anemia, unspecified: Secondary | ICD-10-CM

## 2020-10-04 MED ORDER — TRULICITY 3 MG/0.5ML ~~LOC~~ SOAJ: 3.0000 mg | SUBCUTANEOUS | 6 refills | Status: DC

## 2020-10-04 NOTE — Telephone Encounter (Signed)
Patient returned call and made aware of lab results.   Patient states that he has frank blood in stool.   Referral placed to GI for evaluation.

## 2020-10-04 NOTE — Telephone Encounter (Signed)
Ok to refill??  Last office visit 10/02/2020.  Last refill 09/18/2020.

## 2020-10-05 LAB — CBC WITH DIFFERENTIAL/PLATELET
Absolute Monocytes: 621 cells/uL (ref 200–950)
Basophils Absolute: 17 cells/uL (ref 0–200)
Basophils Relative: 0.2 %
Eosinophils Absolute: 77 cells/uL (ref 15–500)
Eosinophils Relative: 0.9 %
HCT: 31.5 % — ABNORMAL LOW (ref 38.5–50.0)
Hemoglobin: 9 g/dL — ABNORMAL LOW (ref 13.2–17.1)
Lymphs Abs: 1879 cells/uL (ref 850–3900)
MCH: 20.1 pg — ABNORMAL LOW (ref 27.0–33.0)
MCHC: 28.6 g/dL — ABNORMAL LOW (ref 32.0–36.0)
MCV: 70.3 fL — ABNORMAL LOW (ref 80.0–100.0)
MPV: 11.4 fL (ref 7.5–12.5)
Monocytes Relative: 7.3 %
Neutro Abs: 5908 cells/uL (ref 1500–7800)
Neutrophils Relative %: 69.5 %
Platelets: 200 10*3/uL (ref 140–400)
RBC: 4.48 10*6/uL (ref 4.20–5.80)
RDW: 15.3 % — ABNORMAL HIGH (ref 11.0–15.0)
Total Lymphocyte: 22.1 %
WBC: 8.5 10*3/uL (ref 3.8–10.8)

## 2020-10-05 LAB — COMPLETE METABOLIC PANEL WITH GFR
AG Ratio: 1.3 (calc) (ref 1.0–2.5)
ALT: 11 U/L (ref 9–46)
AST: 14 U/L (ref 10–40)
Albumin: 3.9 g/dL (ref 3.6–5.1)
Alkaline phosphatase (APISO): 77 U/L (ref 36–130)
BUN: 10 mg/dL (ref 7–25)
CO2: 33 mmol/L — ABNORMAL HIGH (ref 20–32)
Calcium: 9.2 mg/dL (ref 8.6–10.3)
Chloride: 104 mmol/L (ref 98–110)
Creat: 1.01 mg/dL (ref 0.60–1.29)
Globulin: 3 g/dL (calc) (ref 1.9–3.7)
Glucose, Bld: 103 mg/dL — ABNORMAL HIGH (ref 65–99)
Potassium: 4.3 mmol/L (ref 3.5–5.3)
Sodium: 143 mmol/L (ref 135–146)
Total Bilirubin: 0.3 mg/dL (ref 0.2–1.2)
Total Protein: 6.9 g/dL (ref 6.1–8.1)
eGFR: 94 mL/min/{1.73_m2} (ref >=60)

## 2020-10-05 LAB — HEMOGLOBIN A1C
Hgb A1c MFr Bld: 6.1 % of total Hgb — ABNORMAL HIGH (ref <5.7)
Mean Plasma Glucose: 128 mg/dL
eAG (mmol/L): 7.1 mmol/L

## 2020-10-05 LAB — TEST AUTHORIZATION

## 2020-10-05 LAB — VITAMIN B12: Vitamin B-12: 527 pg/mL (ref 200–1100)

## 2020-10-05 LAB — IRON,TIBC AND FERRITIN PANEL
%SAT: 22 % (calc) (ref 20–48)
Ferritin: 90 ng/mL (ref 38–380)
Iron: 66 ug/dL (ref 50–180)
TIBC: 295 mcg/dL (calc) (ref 250–425)

## 2020-10-05 MED ORDER — HYDROCODONE-ACETAMINOPHEN 5-325 MG PO TABS: 1.0000 | ORAL_TABLET | Freq: Four times a day (QID) | ORAL | 0 refills | Status: DC | PRN

## 2020-10-09 ENCOUNTER — Encounter: Payer: Self-pay | Admitting: Internal Medicine

## 2020-10-22 ENCOUNTER — Other Ambulatory Visit: Payer: Self-pay | Admitting: Family Medicine

## 2020-10-24 ENCOUNTER — Other Ambulatory Visit: Payer: Self-pay | Admitting: Family Medicine

## 2020-10-24 ENCOUNTER — Other Ambulatory Visit (INDEPENDENT_AMBULATORY_CARE_PROVIDER_SITE_OTHER): Payer: Self-pay | Admitting: Family Medicine

## 2020-10-24 DIAGNOSIS — E785 Hyperlipidemia, unspecified: Secondary | ICD-10-CM

## 2020-10-24 DIAGNOSIS — E1169 Type 2 diabetes mellitus with other specified complication: Secondary | ICD-10-CM

## 2020-10-24 MED ORDER — HYDROCODONE-ACETAMINOPHEN 5-325 MG PO TABS: 1.0000 | ORAL_TABLET | Freq: Four times a day (QID) | ORAL | 0 refills | Status: DC | PRN

## 2020-10-24 NOTE — Telephone Encounter (Signed)
Ok to refill??  Last office visit 10/02/2020.  Last refill 10/05/2020.

## 2020-11-03 ENCOUNTER — Encounter (INDEPENDENT_AMBULATORY_CARE_PROVIDER_SITE_OTHER): Payer: Self-pay | Admitting: Family Medicine

## 2020-11-06 NOTE — Telephone Encounter (Signed)
Please review and advise.

## 2020-11-06 NOTE — Telephone Encounter (Signed)
Dr.Ukleja 

## 2020-11-07 ENCOUNTER — Other Ambulatory Visit: Payer: Self-pay | Admitting: Family Medicine

## 2020-11-08 NOTE — Telephone Encounter (Signed)
Ok to refill??  Last office visit 10/02/2020.  Last refill 10/24/2020.

## 2020-11-09 MED ORDER — HYDROCODONE-ACETAMINOPHEN 5-325 MG PO TABS: 1.0000 | ORAL_TABLET | Freq: Four times a day (QID) | ORAL | 0 refills | Status: DC | PRN

## 2020-11-24 ENCOUNTER — Other Ambulatory Visit: Payer: Self-pay | Admitting: Family Medicine

## 2020-11-24 MED ORDER — HYDROCODONE-ACETAMINOPHEN 5-325 MG PO TABS: 1.0000 | ORAL_TABLET | Freq: Four times a day (QID) | ORAL | 0 refills | Status: DC | PRN

## 2020-11-24 NOTE — Telephone Encounter (Signed)
Ok to refill??  Last office visit 10/02/2020.  Last refill 11/09/2020 #60 tabs.

## 2020-12-06 ENCOUNTER — Other Ambulatory Visit: Payer: Self-pay | Admitting: Family Medicine

## 2020-12-06 NOTE — Telephone Encounter (Signed)
LOV for cough 06/06/20 Last refill 06/09/20, , 0 refills  Please review, thanks!

## 2020-12-07 ENCOUNTER — Other Ambulatory Visit: Payer: Self-pay | Admitting: Family Medicine

## 2020-12-07 MED ORDER — HYDROCODONE-ACETAMINOPHEN 5-325 MG PO TABS: 1.0000 | ORAL_TABLET | Freq: Four times a day (QID) | ORAL | 0 refills | Status: DC | PRN

## 2020-12-07 NOTE — Telephone Encounter (Signed)
LOV 10/02/20 Last refill 11/24/20, #60, 0 refills  Please review, thanks!

## 2020-12-21 ENCOUNTER — Other Ambulatory Visit: Payer: Self-pay | Admitting: Family Medicine

## 2020-12-21 ENCOUNTER — Telehealth: Payer: Self-pay

## 2020-12-21 MED ORDER — HYDROCODONE-ACETAMINOPHEN 5-325 MG PO TABS: 1.0000 | ORAL_TABLET | Freq: Four times a day (QID) | ORAL | 0 refills | Status: DC | PRN

## 2020-12-21 NOTE — Telephone Encounter (Signed)
Pt called to report he has developed rash on his thighs, in the folds of the skin. The areas are red, oozing, itching, burning and peeling. Pt states he has previously been given abtx and silvadene cream. He would like to know if you would prescribe these again. Pt denies fever or other symptoms.   Please advise, thanks!

## 2020-12-22 ENCOUNTER — Other Ambulatory Visit: Payer: Self-pay | Admitting: Family Medicine

## 2020-12-22 MED ORDER — CLOTRIMAZOLE-BETAMETHASONE 1-0.05 % EX CREA: 1.0000 "application " | TOPICAL_CREAM | Freq: Two times a day (BID) | CUTANEOUS | 0 refills | Status: DC

## 2020-12-22 NOTE — Telephone Encounter (Signed)
Spoke with pt and advised. Pt voiced understanding. Nothing further needed.

## 2021-01-06 ENCOUNTER — Other Ambulatory Visit: Payer: Self-pay | Admitting: Family Medicine

## 2021-01-07 ENCOUNTER — Encounter: Payer: Self-pay | Admitting: Cardiology

## 2021-01-07 NOTE — Progress Notes (Addendum)
Cardiology Office Note   Date:  03/15/2021   ID:  [name], DOB Jan 13, 1976, MRN [id number]  PCP:  [name], MD  Cardiologist:   Rollene Rotunda, MD Referring:  [name], MD  Chief Complaint  Patient presents with   Shortness of Breath      History of Present Illness: Ricardo Lopez. is a 45 y.o. male who is referred by [name], MD for evaluation of leg swelling and SOB.    He had an echo in Sept with normal LV function and mild LVH.  He was in the hospital in early sept with Covid pneumonia and then later in Sept with E. Coli bactermia and respiratory failure.  He is also morbidly obese and thought to have obesity hypoventilation syndrome.  He is on home oxygen 2 L all the time sometimes needing 3.    Since I last saw him he has had no new acute problems other than the pneumonia and he had to be treated again with steroids.  His blood sugars well.  His weight is up.  He has gotten his BiPAP which seems to help a lot with his ability to sleep.  His blood pressure has been stable.  His lower extremity swelling has been stable.  He has had no acute PND or orthopnea.  He has had no acute chest pressure, neck or arm discomfort.   Past Medical History:  Diagnosis Date   Anemia    Anxiety and depression    B12 deficiency    Back pain    COVID    9/21- hypoxia requiring oxygen   Diabetes mellitus without complication (HCC)    Frequency of urination    GERD (gastroesophageal reflux disease)    Gluteal abscess    ecoli after covid hospitalization 2021   Headache    Hypertension    Obesity    Sleep apnea    Stomach ulcer    Tachycardia    Thoracic aortic aneurysm     History reviewed. No pertinent surgical history.    Current Outpatient Medications  Medication Sig Dispense Refill   acetaminophen (TYLENOL) 325 MG tablet Take 650 mg by mouth every 6 (six) hours as needed.     albuterol (VENTOLIN HFA) 108 (90 Base) MCG/ACT inhaler  Inhale 2 puffs into the lungs every 4 (four) hours as needed for wheezing or shortness of breath. 18 g 2   ascorbic acid (VITAMIN C) 500 MG tablet Take 1 tablet (500 mg total) by mouth daily. 30 tablet 1   atorvastatin (LIPITOR) 10 MG tablet Take 1 tablet (10 mg total) by mouth daily. 90 tablet 0   buPROPion (WELLBUTRIN XL) 150 MG 24 hr tablet TAKE 1 TABLET(150 MG) BY MOUTH DAILY 30 tablet 5   cetirizine (ZYRTEC) 10 MG tablet TAKE ONE TABLET BY MOUTH DAILY 90 tablet 3   clotrimazole-betamethasone (LOTRISONE) cream Apply 1 application topically 2 (two) times daily. 30 g 0   Dulaglutide (TRULICITY) 3 MG/0.5ML SOPN Inject 3 mg as directed once a week. 2 mL 6   escitalopram (LEXAPRO) 20 MG tablet TAKE 1 TABLET(20 MG) BY MOUTH DAILY 30 tablet 5   FEROSUL 325 (65 Fe) MG tablet TAKE 1 TABLET BY MOUTH EVERY MORNING 30 tablet 3   fluticasone (FLONASE) 50 MCG/ACT nasal spray Place 2 sprays into both nostrils daily. 16 g 6   furosemide (LASIX) 80 MG tablet Take 1 tablet (80 mg total) by mouth 2 (two)  times daily. 180 tablet 3   HYDROcodone-acetaminophen (NORCO) 5-325 MG tablet Take 1 tablet by mouth every 6 (six) hours as needed for moderate pain. 60 tablet 0   meloxicam (MOBIC) 15 MG tablet TAKE 1 TABLET(15 MG) BY MOUTH DAILY 30 tablet 2   metoprolol succinate (TOPROL-XL) 100 MG 24 hr tablet Take 1 tablet (100 mg total) by mouth daily. 90 tablet 2   Multiple Vitamin (MULTIVITAMIN) tablet Take 1 tablet by mouth daily.     pantoprazole (PROTONIX) 40 MG tablet Take 1 tablet (40 mg total) by mouth daily before breakfast. 30 tablet 3   potassium chloride SA (KLOR-CON) 20 MEQ tablet Take 2 tablets (40 mEq total) by mouth daily. 180 tablet 3   prazosin (MINIPRESS) 2 MG capsule TAKE 1 CAPSULE(2 MG) BY MOUTH AT BEDTIME 90 capsule 1   sodium chloride (OCEAN) 0.65 % SOLN nasal spray Place 1 spray into both nostrils as needed for congestion. 88 mL 0   Vitamin D, Ergocalciferol, (DRISDOL) 1.25 MG (50000 UNIT) CAPS  capsule Take 1 capsule (50,000 Units total) by mouth every 7 (seven) days. 12 capsule 0   zolpidem (AMBIEN) 10 MG tablet TAKE 1 TABLET(10 MG) BY MOUTH AT BEDTIME AS NEEDED FOR SLEEP 30 tablet 3   No current facility-administered medications for this visit.    Allergies:   Patient has no known allergies.    ROS:  Please see the history of present illness.   Otherwise, review of systems are positive for none.   All other systems are reviewed and negative.    PHYSICAL EXAM: VS:  BP 134/74    Pulse (!) 105    Ht 5\' 8"  (1.727 m)    Wt (!) 530 lb (240.4 kg)    SpO2 98%    BMI 80.59 kg/m  , BMI Body mass index is 80.59 kg/m. GENERAL:  Well appearing NECK:  No jugular venous distention, waveform within normal limits, carotid upstroke brisk and symmetric, no bruits, no thyromegaly LUNGS:  Clear to auscultation bilaterally CHEST:  Unremarkable HEART:  PMI not displaced or sustained,S1 and S2 within normal limits, no S3, no S4, no clicks, no rubs, no murmurs ABD:  Flat, positive bowel sounds normal in frequency in pitch, no bruits, no rebound, no guarding, no midline pulsatile mass, no hepatomegaly, no splenomegaly EXT:  2 plus pulses throughout, no edema, no cyanosis no clubbing   EKG:  EKG is  ordered today The ekg ordered today demonstrates sinus tachycardia, rate 105, axis within normal limits, intervals within normal limits, no acute ST-T wave changes.   Recent Labs: 05/18/2020: TSH 4.680 10/02/2020: ALT 11; BUN 10; Creat 1.01; Potassium 4.3; Sodium 143 02/23/2021: Hemoglobin 9.3; Platelets 280    Lipid Panel    Component Value Date/Time   CHOL 221 (H) 05/18/2020 1357   TRIG 127 05/18/2020 1357   HDL 53 05/18/2020 1357   LDLCALC 145 (H) 05/18/2020 1357      Wt Readings from Last 3 Encounters:  02/21/21 (!) 523 lb (237.2 kg)  01/08/21 (!) 530 lb (240.4 kg)  07/18/20 (!) 487 lb (220.9 kg)     Other studies Reviewed: Additional studies/ records that were reviewed today include:  None Review of the above records demonstrates:  Please see elsewhere in the note.     ASSESSMENT AND PLAN:  HTN: Blood pressure is controlled.  No change in therapy.   MORBID OBESITY:   I have encouraged him to follow-up with the referral that he has for consideration  of surgical management of his morbid obesity.   DM: A1c is 6.1.  No change in therapy.    SLEEP APNEA:   He is tolerating BiPAP.   TACHYCARDIA: He had normal resting heart rate variation.  No change in therapy.   HYPOXEMIA:   We talked about as needed dosing of his diuretic.  He is watching his low salt.  He has hypoventilation obesity syndrome.  Current medicines are reviewed at length with the patient today.  The patient does not have concerns regarding medicines.  The following changes have been made:  None  Labs/ tests ordered today include: None  Orders Placed This Encounter  Procedures   EKG 12-Lead     Disposition:   FU with me in 12  months.     Signed, Rollene Rotunda, MD  03/15/2021 7:42 AM    [facility]

## 2021-01-08 ENCOUNTER — Other Ambulatory Visit: Payer: Self-pay

## 2021-01-08 ENCOUNTER — Ambulatory Visit (INDEPENDENT_AMBULATORY_CARE_PROVIDER_SITE_OTHER): Payer: 59 | Admitting: Cardiology

## 2021-01-08 ENCOUNTER — Encounter: Payer: Self-pay | Admitting: Cardiology

## 2021-01-08 VITALS — BP 134/74 | HR 105 | Ht 68.0 in | Wt >= 6400 oz

## 2021-01-08 DIAGNOSIS — R000 Tachycardia, unspecified: Secondary | ICD-10-CM

## 2021-01-08 DIAGNOSIS — G4730 Sleep apnea, unspecified: Secondary | ICD-10-CM

## 2021-01-08 DIAGNOSIS — I10 Essential (primary) hypertension: Secondary | ICD-10-CM | POA: Diagnosis not present

## 2021-01-08 NOTE — Patient Instructions (Signed)
Medication Instructions:  °Your Physician recommend you continue on your current medication as directed.   ° °*If you need a refill on your cardiac medications before your next appointment, please call your pharmacy* ° ° °Follow-Up: °At [facility], you and your health needs are our priority.  As part of our continuing mission to provide you with exceptional heart care, we have created designated Provider Care Teams.  These Care Teams include your primary Cardiologist (physician) and Advanced Practice Providers (APPs -  Physician Assistants and Nurse Practitioners) who all work together to provide you with the care you need, when you need it. ° °We recommend signing up for the patient portal called "MyChart".  Sign up information is provided on this After Visit Summary.  MyChart is used to connect with patients for Virtual Visits (Telemedicine).  Patients are able to view lab/test results, encounter notes, upcoming appointments, etc.  Non-urgent messages can be sent to your provider as well.   °To learn more about what you can do with MyChart, go to [email or web address].   ° °Your next appointment:   °1 year(s) ° °The format for your next appointment:   °In Person ° °Provider:   °James Hochrein, MD  ° ° ° °

## 2021-01-09 ENCOUNTER — Other Ambulatory Visit: Payer: Self-pay | Admitting: Family Medicine

## 2021-01-10 ENCOUNTER — Other Ambulatory Visit: Payer: Self-pay | Admitting: Nurse Practitioner

## 2021-01-10 DIAGNOSIS — R0982 Postnasal drip: Secondary | ICD-10-CM

## 2021-01-11 MED ORDER — HYDROCODONE-ACETAMINOPHEN 5-325 MG PO TABS: 1.0000 | ORAL_TABLET | Freq: Four times a day (QID) | ORAL | 0 refills | Status: DC | PRN

## 2021-01-29 ENCOUNTER — Other Ambulatory Visit: Payer: Self-pay | Admitting: Family Medicine

## 2021-01-29 MED ORDER — HYDROCODONE-ACETAMINOPHEN 5-325 MG PO TABS: 1.0000 | ORAL_TABLET | Freq: Four times a day (QID) | ORAL | 0 refills | Status: DC | PRN

## 2021-02-01 ENCOUNTER — Other Ambulatory Visit: Payer: Self-pay | Admitting: Family Medicine

## 2021-02-01 NOTE — Telephone Encounter (Signed)
Ambien refill request.  Last filled 01/08/2021.  Last seen 10/02/2020.

## 2021-02-06 ENCOUNTER — Other Ambulatory Visit: Payer: Self-pay | Admitting: Family Medicine

## 2021-02-13 ENCOUNTER — Other Ambulatory Visit: Payer: Self-pay | Admitting: Family Medicine

## 2021-02-14 NOTE — Telephone Encounter (Signed)
Hydrocodone refill.  Last seen 10/02/2020, last filled

## 2021-02-15 MED ORDER — HYDROCODONE-ACETAMINOPHEN 5-325 MG PO TABS: 1.0000 | ORAL_TABLET | Freq: Four times a day (QID) | ORAL | 0 refills | Status: DC | PRN

## 2021-02-20 NOTE — Progress Notes (Signed)
Referring Provider: Donita BrooksPickard, Warren T, MD Primary Care Physician:  Donita BrooksPickard, Warren T, MD Primary Gastroenterologist:  Dr. Marletta Lorarver  Chief Complaint  Patient presents with   Abdominal Pain    Blood in stool sometime. Bright red some times.     HPI:   Ricardo ParkRobert E Coles Jr. is a 46 y.o. male presenting today at the request of Donita BrooksPickard, Warren T, MD for anemia and frank rectal bleeding. Referral was sent in September 2022.   Chronic microcytic anemia dating back at least to 2019. He has had hemoglobin in the 9-11 range, often evaluated during hospitalizations for acute illness with recommendations to follow-up with GI on an outpatient basis.  He had low iron (22) and saturation (7%) with normal Ferritin in September 2021. Hemoglobin was 10.2 in April 2022. Most recent labs in September 2022 with hemoglobin 9.0. Iron panel, B12, wnl at that time.   Today:  Rectal bleeding started in September 2021. Bright red. On and off every few days. Can occur with or without bowel movements.  In the water, mixed in the stool, on toilet tissue.  Denies rectal pain or known hemorrhoids.  No constipation.  Has bowel movements daily that are are usually soft, formed, and without straining.  Denies melena.  No prior colonoscopy.  No family history of colon cancer.  Also with intermittent abdominal pain across upper abdomen, primarily epigastric, since September 2021. Occurs every few days.  Described as stabbing or burning, worsened by eating/drinking with no specific trigger. Associated heartburn and nausea without vomiting.  Denies dysphagia.  Started OTC Prilosec as needed 6+ months ago which does help with his symptoms occur.  Denies dysphagia.  Has been taking meloxicam daily since July 2022. Started on iron daily 6+ months ago.   Reports his diet has changed significantly over the last year as he has been trying to lose weight.  Reports he was on steroids for too long when he had COVID-19 in September 2021 and  gained quite a bit of weight.  Reports he was 400 something pounds and gained just below 600 pounds.  He is not eating any fried/fatty foods or spicy foods.   He has been on supplemental O2 since COVID. On 2-3 L. Sleeps with BiPAP.   Has history of heart palpitations- hasn't taken metoprolol today. O2 drops and HR increases with activity.    Past Medical History:  Diagnosis Date   Anemia    Anxiety and depression    B12 deficiency    Back pain    COVID    9/21- hypoxia requiring oxygen   Diabetes mellitus without complication (HCC)    Frequency of urination    GERD (gastroesophageal reflux disease)    Gluteal abscess    ecoli after covid hospitalization 2021   Headache    Hypertension    Obesity    Sleep apnea    Stomach ulcer    Tachycardia    Thoracic aortic aneurysm     History reviewed. No pertinent surgical history.   Current Outpatient Medications  Medication Sig Dispense Refill   acetaminophen (TYLENOL) 325 MG tablet Take 650 mg by mouth every 6 (six) hours as needed.     albuterol (VENTOLIN HFA) 108 (90 Base) MCG/ACT inhaler Inhale 2 puffs into the lungs every 4 (four) hours as needed for wheezing or shortness of breath. 18 g 2   ascorbic acid (VITAMIN C) 500 MG tablet Take 1 tablet (500 mg total) by mouth daily. 30 tablet 1  atorvastatin (LIPITOR) 10 MG tablet Take 1 tablet (10 mg total) by mouth daily. 90 tablet 0   buPROPion (WELLBUTRIN XL) 150 MG 24 hr tablet TAKE 1 TABLET(150 MG) BY MOUTH DAILY 30 tablet 5   cetirizine (ZYRTEC) 10 MG tablet TAKE ONE TABLET BY MOUTH DAILY 90 tablet 3   clotrimazole-betamethasone (LOTRISONE) cream Apply 1 application topically 2 (two) times daily. 30 g 0   Dulaglutide (TRULICITY) 3 MG/0.5ML SOPN Inject 3 mg as directed once a week. 2 mL 6   escitalopram (LEXAPRO) 20 MG tablet TAKE 1 TABLET(20 MG) BY MOUTH DAILY 30 tablet 5   FEROSUL 325 (65 Fe) MG tablet TAKE 1 TABLET BY MOUTH EVERY MORNING 30 tablet 3   fluticasone (FLONASE)  50 MCG/ACT nasal spray Place 2 sprays into both nostrils daily. 16 g 6   HYDROcodone-acetaminophen (NORCO) 5-325 MG tablet Take 1 tablet by mouth every 6 (six) hours as needed for moderate pain. 60 tablet 0   meloxicam (MOBIC) 15 MG tablet TAKE 1 TABLET(15 MG) BY MOUTH DAILY 30 tablet 2   metoprolol succinate (TOPROL-XL) 100 MG 24 hr tablet Take 1 tablet (100 mg total) by mouth daily. 90 tablet 2   Multiple Vitamin (MULTIVITAMIN) tablet Take 1 tablet by mouth daily.     pantoprazole (PROTONIX) 40 MG tablet Take 1 tablet (40 mg total) by mouth daily before breakfast. 30 tablet 3   prazosin (MINIPRESS) 2 MG capsule TAKE 1 CAPSULE(2 MG) BY MOUTH AT BEDTIME 30 capsule 2   sodium chloride (OCEAN) 0.65 % SOLN nasal spray Place 1 spray into both nostrils as needed for congestion. 88 mL 0   Vitamin D, Ergocalciferol, (DRISDOL) 1.25 MG (50000 UNIT) CAPS capsule Take 1 capsule (50,000 Units total) by mouth every 7 (seven) days. 12 capsule 0   zolpidem (AMBIEN) 10 MG tablet TAKE 1 TABLET(10 MG) BY MOUTH AT BEDTIME AS NEEDED FOR SLEEP 30 tablet 3   furosemide (LASIX) 80 MG tablet Take 1 tablet (80 mg total) by mouth 2 (two) times daily. 180 tablet 3   potassium chloride SA (KLOR-CON) 20 MEQ tablet Take 2 tablets (40 mEq total) by mouth daily. 180 tablet 3   No current facility-administered medications for this visit.    Allergies as of 02/21/2021   (No Known Allergies)    Family History  Problem Relation Age of Onset   Diabetes Mother    Hypertension Mother    Hyperlipidemia Mother    Thyroid disease Mother    Diabetes Father    Hyperlipidemia Father    Hypertension Father    Depression Father    Anxiety disorder Father    Sleep apnea Father    Colon cancer Neg Hx    Stomach cancer Neg Hx     Social History   Socioeconomic History   Marital status: Married    Spouse name: Not on file   Number of children: Not on file   Years of education: Not on file   Highest education level: Not on  file  Occupational History   Occupation: stay at home spouse  Tobacco Use   Smoking status: Never   Smokeless tobacco: Never  Vaping Use   Vaping Use: Never used  Substance and Sexual Activity   Alcohol use: Not Currently    Comment: occasional   Drug use: Yes    Types: Marijuana    Comment: occasionally   Sexual activity: Yes  Other Topics Concern   Not on file  Social History Narrative  Lives at home with wife and two children.  Four children in all.     Social Determinants of Health   Financial Resource Strain: Not on file  Food Insecurity: Not on file  Transportation Needs: Not on file  Physical Activity: Not on file  Stress: Not on file  Social Connections: Not on file  Intimate Partner Violence: Not on file    Review of Systems: Gen: Denies any fever, chills, cold or flulike symptoms, presyncope, syncope. CV: See HPI Resp: See HPI GI: See HPI GU : Denies urinary burning, urinary frequency, urinary hesitancy Derm: Denies rash Psych: Denies depression, anxiety. Heme: See HPI  Physical Exam: BP (!) 146/76    Pulse (!) 125    Temp 97.6 F (36.4 C) (Temporal)    Ht 5\' 8"  (1.727 m)    Wt (!) 523 lb (237.2 kg)    BMI 79.52 kg/m  General:   Alert and oriented. Pleasant and cooperative. Morbidly obese.   Head:  Normocephalic and atraumatic. Eyes:  Without icterus, sclera clear and conjunctiva pink.  Ears:  Normal auditory acuity. Lungs:  Clear to auscultation bilaterally. No wheezes, rales, or rhonchi. No distress.  Heart:  Distant heart sounds, S1, S2 present, difficult to assess for murmurs.  Abdomen:  Exam limited due to body habits and completing in exam room chair. +BS, obese, soft, non-tender. Unable to assess for HSM noted. No guarding or rebound. No masses appreciated.  Rectal:  Deferred  Msk:  Symmetrical without gross deformities. Normal posture. Extremities:  With 2-3+ bilateral LE pitting edema below the knees.  Neurologic:  Alert and  oriented x4;   grossly normal neurologically. Skin:  Intact without significant lesions or rashes. Psych: Normal mood and affect.    Assessment: 46 year old male with morbid obesity, BMI 79.5, COVID-19 in September 2021 with subsequent respiratory failure requiring 2-3L O2 via [place], HTN, tachycardia, diabetes, GERD, anemia, presenting today at the request of Dr. October 2021 for further evaluation of anemia, rectal bleeding, and patient also reporting upper abdominal pain and GERD.  Patient has had chronic microcytic anemia dating back at least to 2019 with hemoglobin in the 9-11 range.  Iron low at 22, saturation low at 7% with normal ferritin in September 2021 during hospitalization with COVID-19; ferritin may have been falsely elevated. Most recent hemoglobin 9.0 in September 2022, down from 10.2 in April 2022.  Iron panel and vitamin B12 were within normal limits in September though patient reports he has been on iron supplement for more than 6 months.  Admits to intermittent bright red blood per rectum every few days with or without bowel movements since September 2021 without rectal pain, known hemorrhoids, or constipation.  Also with intermittent upper abdominal pain, primarily epigastric since September 2021 as well with associated heartburn and nausea without vomiting which improves with OTC Prilosec as needed.  He has never had a colonoscopy and denies any family of colon cancer.  No EGD on file though stomach ulcer is listed in his medical history.  He admits to taking meloxicam daily since July 2022.  He needs an EGD and colonoscopy to further evaluate IDA, rectal bleeding, and upper abdominal pain with differentials including esophagitis, gastritis, duodenitis, H. pylori, PUD, colon polyps, malignancy, and benign anorectal bleeding in the setting of hemorrhoids. Considering postprandial abdominal pain, can't rule out biliary source, but as his abdominal exam is benign today, LFTs recently normal, and his symptoms  improve with PPI, I feel this is less likely. We can consider  evaluating his gallbladder after EGD if needed.   Due to BMI, I will need to review with Dr. Virgina Evener at [facility] to see if procedures locally are appropriate.   For now, I will start him on Protonix 40 mg daily, update labs, and have him discontinue NSAIDs.   Plan: Discuss candidacy for EGD and colonoscopy with Dr. Marletta Lor and anesthesia staff at [facility].  Further recommendations to follow. CBC, iron panel with ferritin Continue iron daily for now.  We will need to hold for 10 days prior to procedures. Start Protonix 40 mg daily 30 minutes before breakfast.  Prescription sent to pharmacy. Counseled on GERD diet/lifestyle.  Separate written instructions provided. Stop meloxicam and avoid all NSAIDs. Use Tylenol as needed for pain.  No more than 3000 mg/day. Follow-up TBD.   Ermalinda Memos, PA-C [facility] 02/21/2021

## 2021-02-21 ENCOUNTER — Ambulatory Visit: Payer: 59 | Admitting: Gastroenterology

## 2021-02-21 ENCOUNTER — Other Ambulatory Visit: Payer: Self-pay

## 2021-02-21 ENCOUNTER — Encounter: Payer: Self-pay | Admitting: Gastroenterology

## 2021-02-21 VITALS — BP 146/76 | HR 125 | Temp 97.6°F | Ht 68.0 in | Wt >= 6400 oz

## 2021-02-21 DIAGNOSIS — K219 Gastro-esophageal reflux disease without esophagitis: Secondary | ICD-10-CM

## 2021-02-21 DIAGNOSIS — K625 Hemorrhage of anus and rectum: Secondary | ICD-10-CM

## 2021-02-21 DIAGNOSIS — R1010 Upper abdominal pain, unspecified: Secondary | ICD-10-CM

## 2021-02-21 DIAGNOSIS — D649 Anemia, unspecified: Secondary | ICD-10-CM | POA: Diagnosis not present

## 2021-02-21 MED ORDER — PANTOPRAZOLE SODIUM 40 MG PO TBEC: 40.0000 mg | DELAYED_RELEASE_TABLET | Freq: Every day | ORAL | 3 refills | Status: DC

## 2021-02-21 NOTE — Patient Instructions (Addendum)
We will plan for a colonoscopy and upper endoscopy in the near future with Dr. Abbey Chatters.  I am going to review your case with Dr. Abbey Chatters prior to scheduling.  Please have blood work completed at [facility] taking iron daily.  You will need to hold this for 10 days prior to your procedures.   Start Protonix 40 mg daily 30 minutes before breakfast.  I have sent a prescription to your pharmacy.  Follow a GERD diet:  Avoid fried, fatty, greasy, spicy, citrus foods. Avoid caffeine and carbonated beverages. Avoid chocolate. Try eating 4-6 small meals a day rather than 3 large meals. Do not eat within 3 hours of laying down. Prop head of bed up on wood or bricks to create a 6 inch incline.   I recommend you stop meloxicam and avoid all NSAID products including ibuprofen, Aleve, Advil, BC powders, Goody powders, and anything that says "NSAID" on the package.  Use Tylenol for pain.  No more than 3000 mg per 24-hour period.  It was a pleasure meeting you today!  We will likely follow-up in the office after your procedures.  Do not hesitate to call if you have any questions or concerns prior to your next visit.  Aliene Altes, PA-C [facility]

## 2021-02-22 ENCOUNTER — Encounter: Payer: Self-pay | Admitting: Gastroenterology

## 2021-02-22 ENCOUNTER — Telehealth: Payer: Self-pay | Admitting: Gastroenterology

## 2021-02-22 DIAGNOSIS — R1013 Epigastric pain: Secondary | ICD-10-CM | POA: Insufficient documentation

## 2021-02-22 DIAGNOSIS — R1010 Upper abdominal pain, unspecified: Secondary | ICD-10-CM | POA: Insufficient documentation

## 2021-02-22 NOTE — Telephone Encounter (Signed)
Loma Sousa, please let patient know that I reviewed his case with anesthesiologist, Dr. Modesta Messing, at [facility].  Due to his BMI, he is too high risk for procedures at [facility].  We will have to refer him somewhere else to have procedures completed.  I am going to reach out to the gastroenterologist in [place] to see if he would be appropriate for procedures there.  We will let him know as soon as we hear something back.  Please remind him to have blood work completed.  If he develops any significant rectal bleeding, feeling lightheaded, dizzy, or like he would pass out, he should proceed to the emergency room.

## 2021-02-23 ENCOUNTER — Telehealth: Payer: Self-pay

## 2021-02-23 LAB — CBC WITH DIFFERENTIAL/PLATELET
Absolute Monocytes: 842 cells/uL (ref 200–950)
Basophils Absolute: 42 cells/uL (ref 0–200)
Basophils Relative: 0.4 %
Eosinophils Absolute: 198 cells/uL (ref 15–500)
Eosinophils Relative: 1.9 %
HCT: 31.6 % — ABNORMAL LOW (ref 38.5–50.0)
Hemoglobin: 9.3 g/dL — ABNORMAL LOW (ref 13.2–17.1)
Lymphs Abs: 2891 cells/uL (ref 850–3900)
MCH: 20.4 pg — ABNORMAL LOW (ref 27.0–33.0)
MCHC: 29.4 g/dL — ABNORMAL LOW (ref 32.0–36.0)
MCV: 69.1 fL — ABNORMAL LOW (ref 80.0–100.0)
MPV: 11.9 fL (ref 7.5–12.5)
Monocytes Relative: 8.1 %
Neutro Abs: 6427 cells/uL (ref 1500–7800)
Neutrophils Relative %: 61.8 %
Platelets: 280 10*3/uL (ref 140–400)
RBC: 4.57 10*6/uL (ref 4.20–5.80)
RDW: 15.8 % — ABNORMAL HIGH (ref 11.0–15.0)
Total Lymphocyte: 27.8 %
WBC: 10.4 10*3/uL (ref 3.8–10.8)

## 2021-02-23 LAB — CBC MORPHOLOGY

## 2021-02-23 LAB — IRON,TIBC AND FERRITIN PANEL
%SAT: 18 % (calc) — ABNORMAL LOW (ref 20–48)
Ferritin: 94 ng/mL (ref 38–380)
Iron: 57 ug/dL (ref 50–180)
TIBC: 313 mcg/dL (calc) (ref 250–425)

## 2021-02-23 NOTE — Telephone Encounter (Signed)
The pt has been scheduled for office visit on 03/21/21 at 11:10 am.  The pt and his wife have been advised and given the address and phone number.

## 2021-02-23 NOTE — Telephone Encounter (Signed)
Spoke with Dr. Christella Hartigan who anticipates that their hospitals would be willing to help with his cases, but Dr. Christella Hartigan would like to see the patient in the office first before committing to any procedures considering his high risk status. This may take several weeks to get the patient in the office. Dr. Christella Hartigan has asked his nurse to work on getting the patient scheduled in his next available appointment.   I have relayed this information to the patient including the fact that it may take several weeks to get him in to see the GI providers at [facility]. He is ok with this. I have reminded him on the importance of having his blood work completed and following the instructions given at his last OV. ED precautions discussed.   Rodena Medin, you do not need to call the patient.

## 2021-02-23 NOTE — Telephone Encounter (Signed)
-----   Message from Rachael Fee, MD sent at 02/23/2021  7:45 AM EST ----- Regarding: RE: High risk for anesthesia, needs EGD/Colonoscopy I imagine that our hospitals would be willing to help with his cases.  We have quite a backlog for hospital outpatient cases given serious staffing issues and certainly I would want to meet him in my office first before committing to any type of procedures given his very high risk status. Make sure he is aware it could be a while.  Wendelin Reader,  Can you please offer this man my first available NGI appointment to discuss.     Thanks DJ   ----- Message ----- From: Letta Median, PA-C Sent: 02/22/2021   9:02 PM EST To: Rachael Fee, MD Subject: High risk for anesthesia, needs EGD/Colonosc#  Hi Dr. Christella Hartigan,   I saw this nice guy in the clinic on 2/1 as a new patient for anemia, rectal bleeding, upper abdominal pain, nausea, and heartburn. Ideally, I would like for him to have an EGD and colonoscopy for further evaluation. His BMI is 79, he is on 2-3L O2 via [place] since having COVID 19 in September 2021. I reviewed with anesthesia here at [facility], and they have said he is too high risk for procedure here. Do you think he would be able to have procedures with you all?    Thanks,   Ermalinda Memos, PA-C [facility]

## 2021-02-23 NOTE — Telephone Encounter (Signed)
Noted  

## 2021-03-02 ENCOUNTER — Other Ambulatory Visit: Payer: Self-pay | Admitting: Family Medicine

## 2021-03-02 NOTE — Telephone Encounter (Signed)
LOV 10/02/20 Last refill 02/15/21, #60, 0 refills  Please review, thanks!

## 2021-03-05 ENCOUNTER — Other Ambulatory Visit: Payer: Self-pay | Admitting: Family Medicine

## 2021-03-06 ENCOUNTER — Telehealth: Payer: Self-pay

## 2021-03-06 NOTE — Telephone Encounter (Signed)
Erroneous encounter. Please disregard.

## 2021-03-14 ENCOUNTER — Telehealth: Payer: Self-pay | Admitting: Cardiology

## 2021-03-14 NOTE — Telephone Encounter (Signed)
° °  Pt c/o medication issue:  1. Name of Medication: atorvastatin (LIPITOR) 10 MG tablet  2. How are you currently taking this medication (dosage and times per day)? Take 1 tablet (10 mg total) by mouth daily  3. Are you having a reaction (difficulty breathing--STAT)?   4. What is your medication issue? Ricardo Lopez, medical PharmD with [facility] calling, she said she spoke with pt and was told he is not taking this meds anymore since he couldn't afford going to the weight management center anymore, she is asking if Dr. Antoine Poche can prescribed this meds since this is for pt's cholesterol

## 2021-03-15 ENCOUNTER — Encounter: Payer: Self-pay | Admitting: Cardiology

## 2021-03-19 ENCOUNTER — Ambulatory Visit: Payer: 59 | Admitting: Family Medicine

## 2021-03-19 ENCOUNTER — Other Ambulatory Visit: Payer: Self-pay

## 2021-03-19 VITALS — BP 148/82 | HR 82 | Temp 97.3°F | Resp 18 | Ht 68.0 in | Wt >= 6400 oz

## 2021-03-19 DIAGNOSIS — E1169 Type 2 diabetes mellitus with other specified complication: Secondary | ICD-10-CM

## 2021-03-19 DIAGNOSIS — E1165 Type 2 diabetes mellitus with hyperglycemia: Secondary | ICD-10-CM

## 2021-03-19 DIAGNOSIS — E785 Hyperlipidemia, unspecified: Secondary | ICD-10-CM

## 2021-03-19 DIAGNOSIS — I7121 Aneurysm of the ascending aorta, without rupture: Secondary | ICD-10-CM | POA: Diagnosis not present

## 2021-03-19 MED ORDER — GLUCOSE BLOOD VI STRP: ORAL_STRIP | 12 refills | Status: DC

## 2021-03-19 MED ORDER — ONETOUCH ULTRA VI STRP: 1.0000 | ORAL_STRIP | Freq: Three times a day (TID) | 11 refills | Status: DC

## 2021-03-19 MED ORDER — OZEMPIC (1 MG/DOSE) 2 MG/1.5ML ~~LOC~~ SOPN: 1.0000 mg | PEN_INJECTOR | SUBCUTANEOUS | 3 refills | Status: DC

## 2021-03-19 MED ORDER — FUROSEMIDE 80 MG PO TABS: 80.0000 mg | ORAL_TABLET | Freq: Two times a day (BID) | ORAL | 3 refills | Status: DC

## 2021-03-19 MED ORDER — ATORVASTATIN CALCIUM 10 MG PO TABS: 10.0000 mg | ORAL_TABLET | Freq: Every day | ORAL | 3 refills | Status: DC

## 2021-03-19 MED ORDER — ONETOUCH ULTRA 2 W/DEVICE KIT: 1.0000 | PACK | Freq: Three times a day (TID) | 1 refills | Status: DC

## 2021-03-19 MED ORDER — CLOTRIMAZOLE-BETAMETHASONE 1-0.05 % EX CREA: 1.0000 "application " | TOPICAL_CREAM | Freq: Two times a day (BID) | CUTANEOUS | 1 refills | Status: DC

## 2021-03-19 MED ORDER — FLUCONAZOLE 150 MG PO TABS: 150.0000 mg | ORAL_TABLET | ORAL | 0 refills | Status: DC

## 2021-03-19 MED ORDER — LANCETS MISC: 11 refills | Status: AC

## 2021-03-19 MED ORDER — HYDROCODONE-ACETAMINOPHEN 5-325 MG PO TABS: 1.0000 | ORAL_TABLET | Freq: Four times a day (QID) | ORAL | 0 refills | Status: DC | PRN

## 2021-03-19 NOTE — Progress Notes (Signed)
Subjective:    Patient ID: Ricardo Park., male    DOB: February 22, 1975, 46 y.o.   MRN: [id number]  Patient has several issues today.  First, he reports a rash.  The rash is between his scrotum and his upper medial thighs bilaterally.  The rash itches and burns.  He also has a similar rash underneath his pannus in his lower abdomen.  Rash is consistent with intertrigo.  Second he request a refill on his pain medication.  I am prescribing 60 hydrocodone to last 30 days.  I explained to the patient only to 60 tablets to last 30 days so that were not overusing the medication.  He is due now.  Lastly he has not been on his Trulicity in several weeks.  Apparently there is a supply shortage in his pharmacy is unable to get the Trulicity.  He has not been checking his blood sugars.  His last A1c was 6.1 in September. Past Medical History:  Diagnosis Date   Anemia    Anxiety and depression    B12 deficiency    Back pain    COVID    9/21- hypoxia requiring oxygen   Diabetes mellitus without complication (HCC)    Frequency of urination    GERD (gastroesophageal reflux disease)    Gluteal abscess    ecoli after covid hospitalization 2021   Headache    Hypertension    Obesity    Sleep apnea    Stomach ulcer    Tachycardia    Thoracic aortic aneurysm    No past surgical history on file.  Current Outpatient Medications on File Prior to Visit  Medication Sig Dispense Refill   acetaminophen (TYLENOL) 325 MG tablet Take 650 mg by mouth every 6 (six) hours as needed.     albuterol (VENTOLIN HFA) 108 (90 Base) MCG/ACT inhaler Inhale 2 puffs into the lungs every 4 (four) hours as needed for wheezing or shortness of breath. 18 g 2   ascorbic acid (VITAMIN C) 500 MG tablet Take 1 tablet (500 mg total) by mouth daily. 30 tablet 1   buPROPion (WELLBUTRIN XL) 150 MG 24 hr tablet TAKE 1 TABLET(150 MG) BY MOUTH DAILY 30 tablet 5   cetirizine (ZYRTEC) 10 MG tablet TAKE ONE TABLET BY MOUTH DAILY 90 tablet 3    escitalopram (LEXAPRO) 20 MG tablet TAKE 1 TABLET(20 MG) BY MOUTH DAILY 30 tablet 5   FEROSUL 325 (65 Fe) MG tablet TAKE 1 TABLET BY MOUTH EVERY MORNING 30 tablet 3   fluticasone (FLONASE) 50 MCG/ACT nasal spray Place 2 sprays into both nostrils daily. 16 g 6   meloxicam (MOBIC) 15 MG tablet TAKE 1 TABLET(15 MG) BY MOUTH DAILY 30 tablet 2   metoprolol succinate (TOPROL-XL) 100 MG 24 hr tablet Take 1 tablet (100 mg total) by mouth daily. 90 tablet 2   Multiple Vitamin (MULTIVITAMIN) tablet Take 1 tablet by mouth daily.     pantoprazole (PROTONIX) 40 MG tablet Take 1 tablet (40 mg total) by mouth daily before breakfast. 30 tablet 3   potassium chloride SA (KLOR-CON) 20 MEQ tablet Take 2 tablets (40 mEq total) by mouth daily. 180 tablet 3   prazosin (MINIPRESS) 2 MG capsule TAKE 1 CAPSULE(2 MG) BY MOUTH AT BEDTIME 90 capsule 1   sodium chloride (OCEAN) 0.65 % SOLN nasal spray Place 1 spray into both nostrils as needed for congestion. 88 mL 0   Vitamin D, Ergocalciferol, (DRISDOL) 1.25 MG (50000 UNIT) CAPS capsule Take 1  capsule (50,000 Units total) by mouth every 7 (seven) days. 12 capsule 0   zolpidem (AMBIEN) 10 MG tablet TAKE 1 TABLET(10 MG) BY MOUTH AT BEDTIME AS NEEDED FOR SLEEP 30 tablet 3   No current facility-administered medications on file prior to visit.   No Known Allergies Social History   Socioeconomic History   Marital status: Married    Spouse name: Not on file   Number of children: Not on file   Years of education: Not on file   Highest education level: Not on file  Occupational History   Occupation: stay at home spouse  Tobacco Use   Smoking status: Never   Smokeless tobacco: Never  Vaping Use   Vaping Use: Never used  Substance and Sexual Activity   Alcohol use: Not Currently    Comment: occasional   Drug use: Yes    Types: Marijuana    Comment: occasionally   Sexual activity: Yes  Other Topics Concern   Not on file  Social History Narrative   Lives at home  with wife and two children.  Four children in all.     Social Determinants of Health   Financial Resource Strain: Not on file  Food Insecurity: Not on file  Transportation Needs: Not on file  Physical Activity: Not on file  Stress: Not on file  Social Connections: Not on file  Intimate Partner Violence: Not on file    Review of Systems  Skin:  Positive for rash.  All other systems reviewed and are negative.     Objective:   Physical Exam Vitals reviewed.  Constitutional:      Appearance: He is obese.  Cardiovascular:     Rate and Rhythm: Normal rate and regular rhythm.     Heart sounds: Normal heart sounds.  Pulmonary:     Effort: Pulmonary effort is normal. No respiratory distress.     Breath sounds: Wheezing and rales present. No rhonchi.  Musculoskeletal:        General: Deformity present.     Right lower leg: Edema present.     Left lower leg: Edema present.  Neurological:     Mental Status: He is alert.   Wt Readings from Last 3 Encounters:  03/19/21 (!) 522 lb (236.8 kg)  02/21/21 (!) 523 lb (237.2 kg)  01/08/21 (!) 530 lb (240.4 kg)          Assessment & Plan:   Type 2 diabetes mellitus with hyperglycemia, without long-term current use of insulin (HCC) - Plan: Hemoglobin A1c, COMPLETE METABOLIC PANEL WITH GFR, Lipid panel  Hyperlipidemia associated with type 2 diabetes mellitus (HCC) - Plan: atorvastatin (LIPITOR) 10 MG tablet, Hemoglobin A1c, COMPLETE METABOLIC PANEL WITH GFR, Lipid panel  Aneurysm of ascending aorta without rupture - Plan: CT Angio Chest W/Cm &/Or Wo Cm I believe the rash is intertrigo.  Given the severity of the intertrigo, use Diflucan 150 mg p.o. weekly for 4 weeks coupled with Lotrisone twice daily for 14 days.  Discussed weight loss strategies to help prevent intertrigo including keeping the area dry and clean.  Weight loss would also help.  Switch the patient from Trulicity to Ozempic 1 mg subcu weekly and uptitrate as tolerated.   Return fasting for a CMP, lipid panel, and an A1c.  I refilled his pain medication.  Moving forward, I will 60 tablets to last 30 days.  I want to avoid continued high-dose use of narcotics.

## 2021-03-19 NOTE — Addendum Note (Signed)
Addended by: Shelby Dubin on: 03/19/2021 12:07 PM   Modules accepted: Orders

## 2021-03-21 ENCOUNTER — Ambulatory Visit: Payer: 59 | Admitting: Gastroenterology

## 2021-03-21 ENCOUNTER — Encounter: Payer: Self-pay | Admitting: Gastroenterology

## 2021-03-21 ENCOUNTER — Other Ambulatory Visit (INDEPENDENT_AMBULATORY_CARE_PROVIDER_SITE_OTHER): Payer: 59

## 2021-03-21 VITALS — BP 140/86 | HR 88 | Ht 68.0 in | Wt >= 6400 oz

## 2021-03-21 DIAGNOSIS — I10 Essential (primary) hypertension: Secondary | ICD-10-CM | POA: Diagnosis not present

## 2021-03-21 DIAGNOSIS — R109 Unspecified abdominal pain: Secondary | ICD-10-CM

## 2021-03-21 LAB — BASIC METABOLIC PANEL
BUN: 10 mg/dL (ref 6–23)
CO2: 31 mEq/L (ref 19–32)
Calcium: 9.1 mg/dL (ref 8.4–10.5)
Chloride: 104 mEq/L (ref 96–112)
Creatinine, Ser: 1.06 mg/dL (ref 0.40–1.50)
GFR: 84.91 mL/min (ref >60.00)
Glucose, Bld: 112 mg/dL — ABNORMAL HIGH (ref 70–99)
Potassium: 4 mEq/L (ref 3.5–5.1)
Sodium: 140 mEq/L (ref 135–145)

## 2021-03-21 NOTE — Progress Notes (Signed)
?HPI: ?This is a very pleasant 46 year old man who was referred to me by Susy Frizzle, MD  to evaluate chronic iron deficiency anemia, microcytic, chronic right elbow per rectum, abdominal discomforts..   ? ?He has microcytic anemia dating back at least 6 years.  Blood work February 2017 shows hemoglobin 10.4, MCV 66 ? ?More recently CBC 02/2021 hemoglobin 9.3, MCV 69, RDW 16, ferritin normal at 94, slightly low iron saturation.  I think he has been on iron for quite a while. ? ?He was seen by [facility] gastroenterology a month or 2 ago and it appears that they are unable to help him because anesthesia at their facility is unwilling to sedate him given his massive obesity. ? ?He is here with his wife today.  He is wearing nasal cannula oxygen as he does 24/7. ? ?He has had daily bright red blood per rectum with solid bowel movement just about every day for quite a while, at least a year or 2.  He has been on iron for probably 2 years orally. ? ?He has cramping abdominal discomforts and upper abdominal pains.  This occurs every few days.  It is not associate with nausea or vomiting.  I do not see any abdominal imaging for him in many years at least. ? ?His weight is overall stable, he is massively obese with a BMI of 78 ? ?Review of systems: ?Pertinent positive and negative review of systems were noted in the above HPI section. All other review negative. ? ? ?Past Medical History:  ?Diagnosis Date  ? Anemia   ? Anxiety and depression   ? B12 deficiency   ? Back pain   ? COVID   ? 9/21- hypoxia requiring oxygen  ? Diabetes mellitus without complication ([place])   ? Frequency of urination   ? GERD (gastroesophageal reflux disease)   ? Gluteal abscess   ? ecoli after covid hospitalization 2021  ? Headache   ? Hypertension   ? Obesity   ? Sleep apnea   ? Stomach ulcer   ? Tachycardia   ? Thoracic aortic aneurysm   ? ? ?History reviewed. No pertinent surgical history. ? ?Current Outpatient Medications  ?Medication  Instructions  ? acetaminophen (TYLENOL) 650 mg, Oral, Every 6 hours PRN  ? albuterol (VENTOLIN HFA) 108 (90 Base) MCG/ACT inhaler 2 puffs, Inhalation, Every 4 hours PRN  ? ascorbic acid (VITAMIN C) 500 mg, Oral, Daily  ? atorvastatin (LIPITOR) 10 mg, Oral, Daily  ? Blood Glucose Monitoring Suppl (ONE TOUCH ULTRA 2) w/Device KIT 1 each, Does not apply, 3 times daily  ? buPROPion (WELLBUTRIN XL) 150 MG 24 hr tablet TAKE 1 TABLET(150 MG) BY MOUTH DAILY  ? cetirizine (ZYRTEC) 10 MG tablet TAKE ONE TABLET BY MOUTH DAILY  ? clotrimazole-betamethasone (LOTRISONE) cream 1 application, Topical, 2 times daily  ? escitalopram (LEXAPRO) 20 MG tablet TAKE 1 TABLET(20 MG) BY MOUTH DAILY  ? FEROSUL 325 (65 Fe) MG tablet TAKE 1 TABLET BY MOUTH EVERY MORNING  ? fluconazole (DIFLUCAN) 150 mg, Oral, Weekly  ? fluticasone (FLONASE) 50 MCG/ACT nasal spray 2 sprays, Each Nare, Daily  ? furosemide (LASIX) 80 mg, Oral, 2 times daily  ? glucose blood (ONETOUCH ULTRA) test strip 1 each, Other, 3 times daily, Use to check blood sugar three times daily as instructed  ? HYDROcodone-acetaminophen (NORCO) 5-325 MG tablet 1 tablet, Oral, Every 6 hours PRN  ? Lancets MISC Check fbs daily  ? metoprolol succinate (TOPROL-XL) 100 mg, Oral, Daily  ?  Multiple Vitamin (MULTIVITAMIN) tablet 1 tablet, Oral, Daily  ? Ozempic (1 MG/DOSE) 1 mg, Subcutaneous, Weekly  ? pantoprazole (PROTONIX) 40 mg, Oral, Daily before breakfast  ? potassium chloride SA (KLOR-CON) 20 MEQ tablet 40 mEq, Oral, Daily  ? prazosin (MINIPRESS) 2 MG capsule TAKE 1 CAPSULE(2 MG) BY MOUTH AT BEDTIME  ? sodium chloride (OCEAN) 0.65 % SOLN nasal spray 1 spray, Each Nare, As needed  ? Vitamin D (Ergocalciferol) (DRISDOL) 50,000 Units, Oral, Every 7 days  ? zolpidem (AMBIEN) 10 MG tablet TAKE 1 TABLET(10 MG) BY MOUTH AT BEDTIME AS NEEDED FOR SLEEP  ? ? ?Allergies as of 03/21/2021  ? (No Known Allergies)  ? ? ?Family History  ?Problem Relation Age of Onset  ? Diabetes Mother   ? Hypertension  Mother   ? Hyperlipidemia Mother   ? Thyroid disease Mother   ? Diabetes Father   ? Hyperlipidemia Father   ? Hypertension Father   ? Depression Father   ? Anxiety disorder Father   ? Sleep apnea Father   ? Colon cancer Neg Hx   ? Stomach cancer Neg Hx   ? ? ?Social History  ? ?Socioeconomic History  ? Marital status: Married  ?  Spouse name: Not on file  ? Number of children: Not on file  ? Years of education: Not on file  ? Highest education level: Not on file  ?Occupational History  ? Occupation: stay at home spouse  ?Tobacco Use  ? Smoking status: Never  ? Smokeless tobacco: Never  ?Vaping Use  ? Vaping Use: Never used  ?Substance and Sexual Activity  ? Alcohol use: Not Currently  ?  Comment: occasional  ? Drug use: Yes  ?  Types: Marijuana  ?  Comment: occasionally  ? Sexual activity: Yes  ?Other Topics Concern  ? Not on file  ?Social History Narrative  ? Lives at home with wife and two children.  Four children in all.    ? ?Social Determinants of Health  ? ?Financial Resource Strain: Not on file  ?Food Insecurity: Not on file  ?Transportation Needs: Not on file  ?Physical Activity: Not on file  ?Stress: Not on file  ?Social Connections: Not on file  ?Intimate Partner Violence: Not on file  ? ? ? ?Physical Exam: ?BP 140/86   Pulse 88   Ht _0  (1.727 m)   Wt (!) 512 lb (232.2 kg)   BMI 77.85 kg/m?  ?Constitutional: Morbidly obese, wearing nasal cannula oxygen ?Psychiatric: alert and oriented x3 ?Eyes: extraocular movements intact ?Mouth: oral pharynx moist, no lesions ?Neck: supple no lymphadenopathy ?Cardiovascular: heart regular rate and rhythm ?Lungs: clear to auscultation bilaterally ?Abdomen: soft, nontender, nondistended, no obvious ascites, no peritoneal signs, normal bowel sounds ?Extremities: no lower extremity edema bilaterally ?Skin: no lesions on visible extremities ? ? ?Assessment and plan: ?46 y.o. male with morbid obesity, BMI 78, chronic daily rectal bleeding, chronic iron deficiency  anemia, chronic intermittent abdominal pains ? ?I am going to start his work-up with CT scan abdomen pelvis with IV and oral contrast given his abdominal pains.  He is almost certainly going to need a colonoscopy and an upper endoscopy for his chronic iron deficiency anemia, upper abdominal pains, bright red blood per rectum.  He is at elevated risk for procedure related complications given his massive obesity, chronic oxygen requirement.  We will get a cardiology "clearance" prior to scheduling those procedures which will obviously have to be done in the hospital setting. ? ?Please  see the "Patient Instructions" section for addition details about the plan. ? ? ?Owens Loffler, MD ?[facility] ?03/21/2021, 11:10 AM ? ?Cc: Susy Frizzle, MD ? ?Total time on date of encounter was 47  minutes (this included time spent preparing to see the patient reviewing records; obtaining and/or reviewing separately obtained history; performing a medically appropriate exam and/or evaluation; counseling and educating the patient and family if present; ordering medications, tests or procedures if applicable; and documenting clinical information in the health record). ? ? ? ? ? ? ?

## 2021-03-21 NOTE — Patient Instructions (Signed)
If you are age 46 or younger, your body mass index should be between 19-25. Your Body mass index is 77.85 kg/m?Marland Kitchen If this is out of the aformentioned range listed, please consider follow up with your Primary Care Provider.  ?________________________________________________________ ? ?The [facility] providers would like to encourage you to use [facility] to communicate with providers for non-urgent requests or questions.  Due to long hold times on the telephone, sending your provider a message by [facility] may be a faster and more efficient way to get a response.  Please allow 48 business hours for a response.  Please remember that this is for non-urgent requests.  ?_______________________________________________________ ? ?Your provider has requested that you go to the basement level for lab work before leaving today. Press "B" on the elevator. The lab is located at the first door on the left as you exit the elevator. ? ?You will need to have cardiac clearance prior to having endoscopy and colonoscopy.  Please contact your cardiologist to set up an appointment.  ? ?You have been scheduled for a CT scan of the abdomen and pelvis at [facility], 1st floor Radiology. You are scheduled on 04-05-21  at 10:00am. You should arrive 30 minutes prior to your appointment time for registration.  Please pick up 2 bottles of contrast from [place] at least 3 days prior to your scan. The solution may taste better if refrigerated, but do NOT add ice or any other liquid to this solution. Shake well before drinking.  ? ?Please follow the written instructions below on the day of your exam:  ? ?1) Do not eat anything after 6am (4 hours prior to your test)  ? ?2) Drink 1 bottle of contrast @ 8am(2 hours prior to your exam)  Remember to shake well before drinking and do NOT pour over ice. ?    Drink 1 bottle of contrast @ 9am (1 hour prior to your exam)  ? ?You may take any medications as prescribed with a small amount of water, if  necessary. If you take any of the following medications: METFORMIN, GLUCOPHAGE, GLUCOVANCE, AVANDAMET, RIOMET, FORTAMET, ACTOPLUS MET, JANUMET, McKenzie or METAGLIP, you MAY be asked to HOLD this medication 48 hours AFTER the exam.  ? ?The purpose of you drinking the oral contrast is to aid in the visualization of your intestinal tract. The contrast solution may cause some diarrhea. Depending on your individual set of symptoms, you may also receive an intravenous injection of x-ray contrast/dye. Plan on being at [facility] for 45 minutes or longer, depending on the type of exam you are having performed.  ? ?If you have any questions regarding your exam or if you need to reschedule, you may call [facility] at [phone] between the hours of 8:00 am and 5:00 pm, Monday-Friday.  ? ?Due to recent changes in healthcare laws, you may see the results of your imaging and laboratory studies on MyChart before your provider has had a chance to review them.  We understand that in some cases there may be results that are confusing or concerning to you. Not all laboratory results come back in the same time frame and the provider may be waiting for multiple results in order to interpret others.  Please give [name] 48 hours in order for your provider to thoroughly review all the results before contacting the office for clarification of your results.  ? ?Thank you for entrusting me with your care and choosing [facility]. ? ?  Dr Ardis Hughs ? ? ? ?

## 2021-03-22 ENCOUNTER — Telehealth: Payer: 59 | Admitting: Gastroenterology

## 2021-03-22 ENCOUNTER — Telehealth: Payer: Self-pay

## 2021-03-22 DIAGNOSIS — R109 Unspecified abdominal pain: Secondary | ICD-10-CM

## 2021-03-22 DIAGNOSIS — K625 Hemorrhage of anus and rectum: Secondary | ICD-10-CM

## 2021-03-22 DIAGNOSIS — D649 Anemia, unspecified: Secondary | ICD-10-CM

## 2021-03-22 NOTE — Telephone Encounter (Signed)
Inbound call from patients wife, stated that he had an OV with Dr. Christella Hartigan yesterday and that he wanted them to get in contact with his cardiac Dr. About some things and get back with him. Please advise. ?

## 2021-03-22 NOTE — Telephone Encounter (Signed)
Patient's wife York Cerise states that she contacted [facility] and was told that if our office would send over a medical clearance letter to their office, the would determine if patient could proceed.  Medical clearance letter sent via Epic, and will await response.  Desiree agreed to plan.  ?

## 2021-03-22 NOTE — Telephone Encounter (Signed)
[facility] ?[address] [place] ?[place], [place]  [address] ?Phone:  [phone]   Fax:  [phone] ? ? ?[name] ?DOB: 1975-12-06 ?MRN: [id number] ? ?To whom it may concern,  ? ?The patient above needs to be scheduled for a colonoscopy and an upper endoscopy in the near future under general anesthesia (Propofol).  Patient is at elevated risk for procedure given his massive obesity and chronic oxygen requirement.  These procedures will be done in the hospital setting.  ?  ?Please fax or route a note of Medical Clearance to [phone], Attn: Joni Reining  ? ?Please advise if this patient will require an office visit or further medical work-up before clearance can be given. ?  ?Thank you, ? ? ?Joni Reining, CMA ?[facility] ? ? ?

## 2021-03-23 NOTE — Telephone Encounter (Signed)
Preoperative team, please contact requesting office and asked them to submit a formal preoperative cardiac clearance form.  Once we have received the documentation/cardiac clearance form we will be able to provide recommendations on patient's clearance.  Thank you for your help. ? ?Thomasene Ripple. Petra Dumler NP-C ? ?  ?03/23/2021, 11:19 AM ?[facility] ?[address] ?Office [phone] Fax [phone] ? ?

## 2021-03-23 NOTE — Telephone Encounter (Signed)
? ?  Primary Cardiologist: Rollene Rotunda, MD ? ?Chart reviewed as part of pre-operative protocol coverage. Given past medical history and time since last visit, based on ACC/AHA guidelines, Ricardo Lopez. would be at acceptable risk for the planned procedure without further cardiovascular testing.  ? ?I will route this recommendation to the requesting party via Epic fax function and remove from pre-op pool. ? ?Please call with questions. ? ?Thomasene Ripple. Clifton Safley NP-C ? ?  ?03/23/2021, 11:49 AM ?[facility] ?[address] ?Office [phone] Fax [phone] ? ? ? ? ?

## 2021-03-23 NOTE — Telephone Encounter (Signed)
? ?  Pre-operative Risk Assessment  ?  ?Patient Name: Ricardo Lopez.  ?DOB: 1975/04/01 ?MRN: [id number]  ? ?  ? ?Request for Surgical Clearance   ? ?Procedure:   COLONSOCOPY / ENDOSCOPY ? ?Date of Surgery:  Clearance TBD                              ?   ?Surgeon:   ?Surgeon's Group or Practice Name:  [facility] ?Phone number:  [phone] ?Fax number:   [phone] ?  ?Type of Clearance Requested:   ?- Medical  ?  ?Type of Anesthesia:  General  ?  ?Additional requests/questions:   ? ?Signed, ?Elliot Cousin   ?03/23/2021, 11:28 AM  ? ?

## 2021-03-27 ENCOUNTER — Other Ambulatory Visit: Payer: Self-pay

## 2021-03-27 DIAGNOSIS — K625 Hemorrhage of anus and rectum: Secondary | ICD-10-CM

## 2021-03-27 DIAGNOSIS — D649 Anemia, unspecified: Secondary | ICD-10-CM

## 2021-03-27 DIAGNOSIS — R109 Unspecified abdominal pain: Secondary | ICD-10-CM

## 2021-03-27 MED ORDER — NA SULFATE-K SULFATE-MG SULF 17.5-3.13-1.6 GM/177ML PO SOLN: 1.0000 | ORAL | 0 refills | Status: DC

## 2021-03-27 NOTE — Telephone Encounter (Signed)
Patient is scheduled for ECL at [facility] on 05-17-21 at 8:30am. ?

## 2021-03-27 NOTE — Telephone Encounter (Signed)
Patient has his wife aware of ECL that is scheduled at [facility] on 05-17-21 at 8:30am.  Prep instructions sent to patient via MyChart and discussed by phone.  Patient agreed to plan and verbalized understanding.  No further questions.  ?

## 2021-03-30 ENCOUNTER — Ambulatory Visit (HOSPITAL_COMMUNITY): Payer: 59

## 2021-04-02 ENCOUNTER — Telehealth: Payer: Self-pay

## 2021-04-02 DIAGNOSIS — J9611 Chronic respiratory failure with hypoxia: Secondary | ICD-10-CM

## 2021-04-02 NOTE — Telephone Encounter (Signed)
Patient/wife called to request order for POC to be sent to [facility] [employer] [employer]. They do not want to use insurance at this time.  ? ?Fax  [phone] ? ?Please advise, thanks! ?

## 2021-04-04 NOTE — Telephone Encounter (Signed)
Order and demographics faxed as requested.

## 2021-04-05 ENCOUNTER — Ambulatory Visit (HOSPITAL_COMMUNITY)
Admission: RE | Admit: 2021-04-05 | Discharge: 2021-04-05 | Disposition: A | Discharge status: Home / Self Care | Payer: 59 | Source: Ambulatory Visit | Attending: Gastroenterology | Admitting: Gastroenterology

## 2021-04-05 ENCOUNTER — Other Ambulatory Visit: Payer: Self-pay

## 2021-04-05 DIAGNOSIS — R109 Unspecified abdominal pain: Secondary | ICD-10-CM | POA: Diagnosis not present

## 2021-04-05 MED ORDER — IOHEXOL 300 MG/ML  SOLN
100.0000 mL | Freq: Once | INTRAMUSCULAR | Status: AC | PRN
  Administered 2021-04-05: 100 mL via INTRAVENOUS

## 2021-04-10 ENCOUNTER — Encounter: Payer: Self-pay | Admitting: Gastroenterology

## 2021-04-10 NOTE — Telephone Encounter (Signed)
Dr Christella Hartigan please advise- see pt questions regarding the recent CT scan.  ?

## 2021-04-12 ENCOUNTER — Other Ambulatory Visit: Payer: Self-pay | Admitting: Family Medicine

## 2021-04-13 ENCOUNTER — Encounter: Payer: Self-pay | Admitting: Family Medicine

## 2021-04-13 ENCOUNTER — Other Ambulatory Visit: Payer: Self-pay

## 2021-04-13 MED ORDER — HYDROCODONE-ACETAMINOPHEN 5-325 MG PO TABS: 1.0000 | ORAL_TABLET | Freq: Four times a day (QID) | ORAL | 0 refills | Status: DC | PRN

## 2021-04-13 NOTE — Telephone Encounter (Signed)
Per GI: ?Loretha Stapler, RN ?to [name]   ?   04/12/21  9:38 AM ?Your large liver is probably due to fat deposition in the liver (steatosis on the exam). The spleen is only slightly enlarged and this is not a problem at all.  The CT scan does not show any clear reason for your bleeding or nausea after eating. You should continue to follow the recommendations for colonoscopy and upper endoscopy as we previously discussed in the office, looks like it is scheduled for April sometime.  ?You will need a chest CT in 6 months for follow up. We will contact you at that time.   ? ?Please advise, thanks! ?

## 2021-04-13 NOTE — Telephone Encounter (Signed)
LOV 03/19/21 ?Last refill 03/19/21, #60, 0 refills ? ?Please review, thanks! ? ?

## 2021-04-14 ENCOUNTER — Other Ambulatory Visit: Payer: Self-pay | Admitting: Family Medicine

## 2021-04-16 ENCOUNTER — Other Ambulatory Visit: Payer: 59

## 2021-04-16 ENCOUNTER — Ambulatory Visit
Admission: RE | Admit: 2021-04-16 | Discharge: 2021-04-16 | Disposition: A | Discharge status: Home / Self Care | Payer: 59 | Source: Ambulatory Visit | Attending: Family Medicine | Admitting: Family Medicine

## 2021-04-16 DIAGNOSIS — I7121 Aneurysm of the ascending aorta, without rupture: Secondary | ICD-10-CM

## 2021-04-16 MED ORDER — IOPAMIDOL (ISOVUE-370) INJECTION 76%
75.0000 mL | Freq: Once | INTRAVENOUS | Status: AC | PRN
  Administered 2021-04-16: 75 mL via INTRAVENOUS

## 2021-05-10 ENCOUNTER — Encounter (HOSPITAL_COMMUNITY): Payer: Self-pay | Admitting: Gastroenterology

## 2021-05-11 ENCOUNTER — Other Ambulatory Visit: Payer: Self-pay | Admitting: Family Medicine

## 2021-05-11 MED ORDER — HYDROCODONE-ACETAMINOPHEN 5-325 MG PO TABS
1.0000 | ORAL_TABLET | Freq: Four times a day (QID) | ORAL | 0 refills | Status: DC | PRN
Start: 2021-05-11 — End: 2021-05-30

## 2021-05-11 NOTE — Telephone Encounter (Signed)
LOV 03/19/21 ?Last refill 04/13/21, #60, 0 refills ? ?Please review, thanks! ? ? ?

## 2021-05-16 NOTE — Anesthesia Preprocedure Evaluation (Addendum)
Anesthesia Evaluation  ?Patient identified by MRN, date of birth, ID band ?Patient awake ? ? ? ?Reviewed: ?Allergy & Precautions, H&P , NPO status , Patient's Chart, lab work & pertinent test results ? ?Airway ?Mallampati: II ? ?TM Distance: >3 FB ?Neck ROM: Full ? ? ? Dental ?no notable dental hx. ?(+) Poor Dentition, Chipped, Missing, Dental Advisory Given ?  ?Pulmonary ?shortness of breath, with exertion and Long-Term Oxygen Therapy, sleep apnea ,  ?  ?Pulmonary exam normal ?breath sounds clear to auscultation ? ? ? ? ? ? Cardiovascular ?Exercise Tolerance: Good ?hypertension, Pt. on medications and Pt. on home beta blockers ?+ Peripheral Vascular Disease  ?Normal cardiovascular exam ?Rhythm:Regular Rate:Normal ? ?CT angio 3/23 ?IMPRESSION: ?1. Stable uncomplicated fusiform aneurysmal dilatation of the ?ascending thoracic aorta measuring 44 mm in diameter, unchanged ?compared to the 09/2019 examination by my direct remeasurement. ?  ?Neuro/Psych ? Headaches, PSYCHIATRIC DISORDERS Anxiety Depression negative neurological ROS ? negative psych ROS  ? GI/Hepatic ?Neg liver ROS, GERD  Medicated and Controlled,  ?Endo/Other  ?negative endocrine ROSdiabetesMorbid obesity ? Renal/GU ?  ?negative genitourinary ?  ?Musculoskeletal ?negative musculoskeletal ROS ?(+)  ? Abdominal ?  ?Peds ?negative pediatric ROS ?(+)  Hematology ?negative hematology ROS ?(+) Blood dyscrasia, anemia ,   ?Anesthesia Other Findings ? ? Reproductive/Obstetrics ?negative OB ROS ? ?  ? ? ? ? ? ? ? ? ? ? ? ? ? ?  ?  ? ? ? ? ? ? ? ?Anesthesia Physical ?Anesthesia Plan ? ?ASA: 4 ? ?Anesthesia Plan: MAC  ? ?Post-op Pain Management: Minimal or no pain anticipated  ? ?Induction: Intravenous ? ?PONV Risk Score and Plan: Propofol infusion ? ?Airway Management Planned: Mask, Natural Airway, Nasal Cannula and Simple Face Mask ? ?Additional Equipment: None ? ?Intra-op Plan:  ? ?Post-operative Plan:  ? ?Informed Consent: I  have reviewed the patients History and Physical, chart, labs and discussed the procedure including the risks, benefits and alternatives for the proposed anesthesia with the patient or authorized representative who has indicated his/her understanding and acceptance.  ? ? ? ? ? ?Plan Discussed with: Anesthesiologist and CRNA ? ?Anesthesia Plan Comments: (Home O2,  However pt sats at 96% plus except with exertion. )  ? ? ? ? ?Anesthesia Quick Evaluation ? ?

## 2021-05-17 ENCOUNTER — Encounter (HOSPITAL_COMMUNITY)
Admission: RE | Disposition: A | Discharge status: Home / Self Care | Payer: Self-pay | Source: Ambulatory Visit | Attending: Gastroenterology

## 2021-05-17 ENCOUNTER — Encounter (HOSPITAL_COMMUNITY): Payer: Self-pay | Admitting: Gastroenterology

## 2021-05-17 ENCOUNTER — Other Ambulatory Visit: Payer: Self-pay

## 2021-05-17 ENCOUNTER — Ambulatory Visit (HOSPITAL_BASED_OUTPATIENT_CLINIC_OR_DEPARTMENT_OTHER): Payer: 59 | Admitting: Anesthesiology

## 2021-05-17 ENCOUNTER — Ambulatory Visit (HOSPITAL_COMMUNITY)
Admission: RE | Admit: 2021-05-17 | Discharge: 2021-05-17 | Disposition: A | Discharge status: Home / Self Care | Payer: 59 | Source: Ambulatory Visit | Attending: Gastroenterology | Admitting: Gastroenterology

## 2021-05-17 ENCOUNTER — Ambulatory Visit (HOSPITAL_COMMUNITY): Payer: 59 | Admitting: Anesthesiology

## 2021-05-17 DIAGNOSIS — Z6841 Body Mass Index (BMI) 40.0 and over, adult: Secondary | ICD-10-CM | POA: Insufficient documentation

## 2021-05-17 DIAGNOSIS — K648 Other hemorrhoids: Secondary | ICD-10-CM

## 2021-05-17 DIAGNOSIS — G4730 Sleep apnea, unspecified: Secondary | ICD-10-CM | POA: Insufficient documentation

## 2021-05-17 DIAGNOSIS — K2970 Gastritis, unspecified, without bleeding: Secondary | ICD-10-CM

## 2021-05-17 DIAGNOSIS — D509 Iron deficiency anemia, unspecified: Secondary | ICD-10-CM

## 2021-05-17 DIAGNOSIS — I10 Essential (primary) hypertension: Secondary | ICD-10-CM

## 2021-05-17 DIAGNOSIS — K529 Noninfective gastroenteritis and colitis, unspecified: Secondary | ICD-10-CM

## 2021-05-17 DIAGNOSIS — E119 Type 2 diabetes mellitus without complications: Secondary | ICD-10-CM | POA: Diagnosis not present

## 2021-05-17 DIAGNOSIS — Z79899 Other long term (current) drug therapy: Secondary | ICD-10-CM | POA: Diagnosis not present

## 2021-05-17 DIAGNOSIS — K219 Gastro-esophageal reflux disease without esophagitis: Secondary | ICD-10-CM | POA: Diagnosis not present

## 2021-05-17 DIAGNOSIS — F32A Depression, unspecified: Secondary | ICD-10-CM | POA: Insufficient documentation

## 2021-05-17 DIAGNOSIS — F419 Anxiety disorder, unspecified: Secondary | ICD-10-CM | POA: Insufficient documentation

## 2021-05-17 DIAGNOSIS — K625 Hemorrhage of anus and rectum: Secondary | ICD-10-CM

## 2021-05-17 HISTORY — PX: BIOPSY: SHX5522

## 2021-05-17 HISTORY — PX: ESOPHAGOGASTRODUODENOSCOPY (EGD) WITH PROPOFOL: SHX5813

## 2021-05-17 HISTORY — PX: COLONOSCOPY WITH PROPOFOL: SHX5780

## 2021-05-17 LAB — GLUCOSE, CAPILLARY: Glucose-Capillary: 102 mg/dL — ABNORMAL HIGH (ref 70–99)

## 2021-05-17 SURGERY — COLONOSCOPY WITH PROPOFOL
Anesthesia: Monitor Anesthesia Care

## 2021-05-17 MED ORDER — PROPOFOL 500 MG/50ML IV EMUL
INTRAVENOUS | Status: DC | PRN
  Administered 2021-05-17: 100 ug/kg/min via INTRAVENOUS

## 2021-05-17 MED ORDER — LACTATED RINGERS IV SOLN
INTRAVENOUS | Status: DC
Start: 2021-05-17 — End: 2021-05-17
  Administered 2021-05-17: 1000 mL via INTRAVENOUS

## 2021-05-17 SURGICAL SUPPLY — 25 items

## 2021-05-17 NOTE — Transfer of Care (Signed)
Immediate Anesthesia Transfer of Care Note ? ?Patient: Ricardo Lopez. ? ?Procedure(s) Performed: COLONOSCOPY WITH PROPOFOL ?ESOPHAGOGASTRODUODENOSCOPY (EGD) WITH PROPOFOL ?BIOPSY ? ?Patient Location: Endoscopy Unit ? ?Anesthesia Type:MAC ? ?Level of Consciousness: drowsy ? ?Airway & Oxygen Therapy: Patient Spontanous Breathing and Patient connected to face mask oxygen ? ?Post-op Assessment: Report given to RN and Post -op Vital signs reviewed and stable ? ?Post vital signs: Reviewed and stable ? ?Last Vitals:  ?Vitals Value Taken Time  ?BP 128/58 05/17/21 0920  ?Temp 36.1 ?C 05/17/21 0920  ?Pulse 99 05/17/21 0920  ?Resp 23 05/17/21 0920  ?SpO2 100 % 05/17/21 0920  ?Vitals shown include unvalidated device data. ? ?Last Pain:  ?Vitals:  ? 05/17/21 0920  ?TempSrc: Tympanic  ?PainSc: Asleep  ?   ? ?  ? ?Complications: No notable events documented. ?

## 2021-05-17 NOTE — Discharge Instructions (Signed)
YOU HAD AN ENDOSCOPIC PROCEDURE TODAY: Refer to the procedure report and other information in the discharge instructions given to you for any specific questions about what was found during the examination. If this information does not answer your questions, please call [place] office at [phone] to clarify.  ° °YOU SHOULD EXPECT: Some feelings of bloating in the abdomen. Passage of more gas than usual. Walking can help get rid of the air that was put into your GI tract during the procedure and reduce the bloating. If you had a lower endoscopy (such as a colonoscopy or flexible sigmoidoscopy) you may notice spotting of blood in your stool or on the toilet paper. Some abdominal soreness may be present for a day or two, also. ° °DIET: Your first meal following the procedure should be a light meal and then it is ok to progress to your normal diet. A half-sandwich or bowl of soup is an example of a good first meal. Heavy or fried foods are harder to digest and may make you feel nauseous or bloated. Drink plenty of fluids but you should avoid alcoholic beverages for 24 hours. If you had a esophageal dilation, please see attached instructions for diet.   ° °ACTIVITY: Your care partner should take you home directly after the procedure. You should plan to take it easy, moving slowly for the rest of the day. You can resume normal activity the day after the procedure however YOU SHOULD NOT DRIVE, use power tools, machinery or perform tasks that involve climbing or major physical exertion for 24 hours (because of the sedation medicines used during the test).  ° °SYMPTOMS TO REPORT IMMEDIATELY: °A gastroenterologist can be reached at any hour. Please call [phone]  for any of the following symptoms:  °Following lower endoscopy (colonoscopy, flexible sigmoidoscopy) °Excessive amounts of blood in the stool  °Significant tenderness, worsening of abdominal pains  °Swelling of the abdomen that is new, acute  °Fever of 100° or  higher  °Following upper endoscopy (EGD, EUS, ERCP, esophageal dilation) °Vomiting of blood or coffee ground material  °New, significant abdominal pain  °New, significant chest pain or pain under the shoulder blades  °Painful or persistently difficult swallowing  °New shortness of breath  °Black, tarry-looking or red, bloody stools ° °FOLLOW UP:  °If any biopsies were taken you will be contacted by phone or by letter within the next 1-3 weeks. Call [phone]  if you have not heard about the biopsies in 3 weeks.  °Please also call with any specific questions about appointments or follow up tests. ° °

## 2021-05-17 NOTE — Op Note (Signed)
[facility] Patient Name: Ricardo Lopez Procedure Date: 05/17/2021 MRN: [id number] Attending MD: Milus Banister , MD Date of Birth: [birth year] CSN: [id number] Age: 46 Admit Type: Outpatient Procedure:                Upper GI endoscopy Indications:              rectal bleeding, anemia, abdominal pain. Providers:                Milus Banister, MD, Doristine Johns, RN, [facility] Technician, Technician Referring MD:             [facility]: Aliene Altes, PA; Hurshel Keys,                            DO Medicines:                Monitored Anesthesia Care Complications:            No immediate complications. Estimated blood loss:                            None. Estimated Blood Loss:     Estimated blood loss: none. Procedure:                Pre-Anesthesia Assessment:                           - Prior to the procedure, a History and Physical                            was performed, and patient medications and                            allergies were reviewed. The patient's tolerance of                            previous anesthesia was also reviewed. The risks                            and benefits of the procedure and the sedation                            options and risks were discussed with the patient.                            All questions were answered, and informed consent                            was obtained. Prior Anticoagulants: The patient has                            taken no previous anticoagulant or antiplatelet  agents. ASA Grade Assessment: III - A patient with                            severe systemic disease. After reviewing the risks                            and benefits, the patient was deemed in                            satisfactory condition to undergo the procedure.                           After obtaining informed consent, the endoscope was                             passed under direct vision. Throughout the                            procedure, the patient's blood pressure, pulse, and                            oxygen saturations were monitored continuously. The                            [id number] Olympus endoscope was introduced                            through the mouth, and advanced to the second part                            of duodenum. The upper GI endoscopy was                            accomplished without difficulty. The patient                            tolerated the procedure well. Scope In: Scope Out: Findings:      Minimal inflammation characterized by erythema was found in the gastric       antrum. Biopsies were taken with a cold forceps for histology.      The exam was otherwise without abnormality. Impression:               - Very mild, non-specific distal gastritis.                            Biopsies taken to check for H. pylori infection.                           - The examination was otherwise normal. Moderate Sedation:      Not Applicable - Patient had care per Anesthesia. Recommendation:           - Patient has a contact number available for  emergencies. The signs and symptoms of potential                            delayed complications were discussed with the                            patient. Return to normal activities tomorrow.                            Written discharge instructions were provided to the                            patient.                           - Resume previous diet.                           - Continue present medications.                           - Await pathology results. Procedure Code(s):        --- Professional ---                           [phone], Esophagogastroduodenoscopy, flexible,                            transoral; with biopsy, single or multiple Diagnosis Code(s):        --- Professional ---                           K29.70, Gastritis,  unspecified, without bleeding                           D50.9, Iron deficiency anemia, unspecified CPT copyright 2019 American Medical Association. All rights reserved. The codes documented in this report are preliminary and upon coder review may  be revised to meet current compliance requirements. Milus Banister, MD 05/17/2021 9:19:45 AM This report has been signed electronically. Number of Addenda: 0

## 2021-05-17 NOTE — H&P (Signed)
?HPI: ?This is a man referred by [facility] because anesthesia locally would not help with his case. ? ?He's had bleeding, abd pains, anemia ? ? ?ROS: complete GI ROS as described in HPI, all other review negative. ? ?Constitutional:  No unintentional weight loss ? ? ?Past Medical History:  ?Diagnosis Date  ? Anemia   ? Anxiety and depression   ? B12 deficiency   ? Back pain   ? COVID   ? 9/21- hypoxia requiring oxygen  ? Diabetes mellitus without complication ([place])   ? Frequency of urination   ? GERD (gastroesophageal reflux disease)   ? Gluteal abscess   ? ecoli after covid hospitalization 2021  ? Headache   ? Hypertension   ? Obesity   ? Sleep apnea   ? Stomach ulcer   ? Tachycardia   ? Thoracic aortic aneurysm [facility])   ? ? ?History reviewed. No pertinent surgical history. ? ?Current Outpatient Medications  ?Medication Instructions  ? acetaminophen (TYLENOL) 650 mg, Oral, Every 6 hours PRN  ? albuterol (VENTOLIN HFA) 108 (90 Base) MCG/ACT inhaler 2 puffs, Inhalation, Every 4 hours PRN  ? ascorbic acid (VITAMIN C) 500 mg, Oral, Daily  ? atorvastatin (LIPITOR) 10 mg, Oral, Daily  ? Blood Glucose Monitoring Suppl (ONE TOUCH ULTRA 2) w/Device KIT 1 each, Does not apply, 3 times daily  ? buPROPion (WELLBUTRIN XL) 150 MG 24 hr tablet TAKE 1 TABLET(150 MG) BY MOUTH DAILY  ? cetirizine (ZYRTEC) 10 MG tablet TAKE ONE TABLET BY MOUTH DAILY  ? clotrimazole-betamethasone (LOTRISONE) cream 1 application., Topical, 2 times daily  ? escitalopram (LEXAPRO) 20 MG tablet TAKE 1 TABLET(20 MG) BY MOUTH DAILY  ? FEROSUL 325 (65 Fe) MG tablet TAKE 1 TABLET BY MOUTH EVERY MORNING  ? fluconazole (DIFLUCAN) 150 mg, Oral, Weekly  ? fluticasone (FLONASE) 50 MCG/ACT nasal spray 2 sprays, Each Nare, Daily  ? furosemide (LASIX) 80 mg, Oral, 2 times daily  ? glucose blood (ONETOUCH ULTRA) test strip 1 each, Other, 3 times daily, Use to check blood sugar three times daily as instructed  ? HYDROcodone-acetaminophen (NORCO) 5-325 MG tablet 1 tablet,  Oral, Every 6 hours PRN  ? Lancets MISC Check fbs daily  ? metoprolol succinate (TOPROL-XL) 100 mg, Oral, Daily  ? Multiple Vitamin (MULTIVITAMIN) tablet 1 tablet, Oral, Daily  ? Na Sulfate-K Sulfate-Mg Sulf (SUPREP BOWEL PREP KIT) 17.5-3.13-1.6 GM/177ML SOLN 1 kit, Oral, As directed  ? Ozempic (1 MG/DOSE) 1 mg, Subcutaneous, Weekly  ? pantoprazole (PROTONIX) 40 mg, Oral, Daily before breakfast  ? potassium chloride SA (KLOR-CON M) 20 MEQ tablet 20 mEq, Oral, 2 times daily  ? prazosin (MINIPRESS) 2 MG capsule TAKE 1 CAPSULE(2 MG) BY MOUTH AT BEDTIME  ? sodium chloride (OCEAN) 0.65 % SOLN nasal spray 1 spray, Each Nare, As needed  ? Vitamin D 2,000 Units, Oral, Daily  ? zolpidem (AMBIEN) 10 MG tablet TAKE 1 TABLET(10 MG) BY MOUTH AT BEDTIME AS NEEDED FOR SLEEP  ? ? ?Allergies as of 03/27/2021  ? (No Known Allergies)  ? ? ?Family History  ?Problem Relation Age of Onset  ? Diabetes Mother   ? Hypertension Mother   ? Hyperlipidemia Mother   ? Thyroid disease Mother   ? Diabetes Father   ? Hyperlipidemia Father   ? Hypertension Father   ? Depression Father   ? Anxiety disorder Father   ? Sleep apnea Father   ? Colon cancer Neg Hx   ? Stomach cancer Neg Hx   ? ? ?  Social History  ? ?Socioeconomic History  ? Marital status: Married  ?  Spouse name: Not on file  ? Number of children: Not on file  ? Years of education: Not on file  ? Highest education level: Not on file  ?Occupational History  ? Occupation: stay at home spouse  ?Tobacco Use  ? Smoking status: Never  ? Smokeless tobacco: Never  ?Vaping Use  ? Vaping Use: Never used  ?Substance and Sexual Activity  ? Alcohol use: Not Currently  ?  Comment: occasional  ? Drug use: Yes  ?  Types: Marijuana  ?  Comment: occasionally  ? Sexual activity: Yes  ?Other Topics Concern  ? Not on file  ?Social History Narrative  ? Lives at home with wife and two children.  Four children in all.    ? ?Social Determinants of Health  ? ?Financial Resource Strain: Not on file  ?Food  Insecurity: Not on file  ?Transportation Needs: Not on file  ?Physical Activity: Not on file  ?Stress: Not on file  ?Social Connections: Not on file  ?Intimate Partner Violence: Not on file  ? ? ? ?Physical Exam: ?BP (!) 196/79   Pulse 92   Temp (!) 97.1 ?F (36.2 ?C) (Temporal)   Resp 17   Ht 5' 8"  (1.727 m)   Wt (!) 232.2 kg   SpO2 100%   BMI 77.85 kg/m?  ?Constitutional: generally well-appearing ?Psychiatric: alert and oriented x3 ?Abdomen: soft, nontender, nondistended, no obvious ascites, no peritoneal signs, normal bowel sounds ?No peripheral edema noted in lower extremities ? ?Assessment and plan: ?46 y.o. male with morbid obesity, rectal bleeding, abd pains, anemia ? ?For colonoscopy and EGD today ? ?Please see the "Patient Instructions" section for addition details about the plan. ? ?Owens Loffler, MD ?[facility] ?05/17/2021, 8:17 AM ? ? ?

## 2021-05-17 NOTE — Anesthesia Postprocedure Evaluation (Signed)
Anesthesia Post Note ? ?Patient: Ricardo Lopez. ? ?Procedure(s) Performed: COLONOSCOPY WITH PROPOFOL ?ESOPHAGOGASTRODUODENOSCOPY (EGD) WITH PROPOFOL ?BIOPSY ? ?  ? ?Patient location during evaluation: PACU ?Anesthesia Type: MAC ?Level of consciousness: awake and alert ?Pain management: pain level controlled ?Vital Signs Assessment: post-procedure vital signs reviewed and stable ?Respiratory status: spontaneous breathing, nonlabored ventilation, respiratory function stable and patient connected to nasal cannula oxygen ?Cardiovascular status: stable and blood pressure returned to baseline ?Postop Assessment: no apparent nausea or vomiting ?Anesthetic complications: no ? ? ?No notable events documented. ? ?Last Vitals:  ?Vitals:  ? 05/17/21 0930 05/17/21 0940  ?BP: 138/80 (!) 156/93  ?Pulse: 95 89  ?Resp: 18 18  ?Temp:    ?SpO2: 92% 94%  ?  ?Last Pain:  ?Vitals:  ? 05/17/21 0940  ?TempSrc:   ?PainSc: 0-No pain  ? ? ?  ?  ?  ?  ?  ?  ? ?[name] ? ? ? ? ?

## 2021-05-17 NOTE — Op Note (Signed)
[facility] Patient Name: Ricardo Lopez Procedure Date: 05/17/2021 MRN: [id number] Attending MD: Milus Banister , MD Date of Birth: 1975-10-12 CSN: [id number] Age: 46 Admit Type: Outpatient Procedure:                Colonoscopy Indications:              Rectal bleeding, anemia, abd pain Providers:                Milus Banister, MD, Doristine Johns, RN, [facility] Technician, Technician Referring MD:             [facility]: Aliene Altes, PA; Hurshel Keys,                            DO Medicines:                Monitored Anesthesia Care Complications:            No immediate complications. Estimated blood loss:                            None. Estimated Blood Loss:     Estimated blood loss: none. Procedure:                Pre-Anesthesia Assessment:                           - Prior to the procedure, a History and Physical                            was performed, and patient medications and                            allergies were reviewed. The patient's tolerance of                            previous anesthesia was also reviewed. The risks                            and benefits of the procedure and the sedation                            options and risks were discussed with the patient.                            All questions were answered, and informed consent                            was obtained. Prior Anticoagulants: The patient has                            taken no previous anticoagulant or antiplatelet                            agents. ASA  Grade Assessment: III - A patient with                            severe systemic disease. After reviewing the risks                            and benefits, the patient was deemed in                            satisfactory condition to undergo the procedure.                           After obtaining informed consent, the colonoscope                            was passed under  direct vision. Throughout the                            procedure, the patient's blood pressure, pulse, and                            oxygen saturations were monitored continuously. The                            [id number] ([id number] Olympus colonoscope was                            introduced through the anus and advanced to the the                            cecum, identified by appendiceal orifice and                            ileocecal valve. The colonoscopy was performed                            without difficulty. The patient tolerated the                            procedure well. The quality of the bowel                            preparation was good. The ileocecal valve,                            appendiceal orifice, and rectum were photographed. Scope In: 8:47:40 AM Scope Out: 9:05:56 AM Scope Withdrawal Time: 0 hours 15 minutes 29 seconds  Total Procedure Duration: 0 hours 18 minutes 16 seconds  Findings:      There was slight granularity and erythema throughout the colon, possibly       more significant in the left colon. I took biopsies from the right colon       (jar 1) and left colon (jar 2).      Internal hemorrhoids were found. The hemorrhoids were medium-sized.  The exam was otherwise without abnormality on direct and retroflexion       views. Impression:               - Mild erythema and granularity of the colon                            mucosa, possibly more significant on the left than                            the right side. Biopsies taken to check for chronic                            inflammation versus possible prep effect.                           - Internal hemorrhoids.                           - The examination was otherwise normal on direct                            and retroflexion views. Moderate Sedation:      Not Applicable - Patient had care per Anesthesia. Recommendation:           - Await pathology results. Procedure Code(s):         --- Professional ---                           [phone], Colonoscopy, flexible; with biopsy, single                            or multiple Diagnosis Code(s):        --- Professional ---                           K52.9, Noninfective gastroenteritis and colitis,                            unspecified                           K64.8, Other hemorrhoids                           K62.5, Hemorrhage of anus and rectum CPT copyright 2019 American Medical Association. All rights reserved. The codes documented in this report are preliminary and upon coder review may  be revised to meet current compliance requirements. Milus Banister, MD 05/17/2021 9:16:59 AM This report has been signed electronically. Number of Addenda: 0

## 2021-05-18 ENCOUNTER — Encounter (HOSPITAL_COMMUNITY): Payer: Self-pay | Admitting: Gastroenterology

## 2021-05-18 LAB — SURGICAL PATHOLOGY

## 2021-05-19 ENCOUNTER — Other Ambulatory Visit: Payer: Self-pay | Admitting: Family Medicine

## 2021-05-21 ENCOUNTER — Telehealth: Payer: Self-pay | Admitting: Gastroenterology

## 2021-05-21 NOTE — Telephone Encounter (Signed)
Reviewed EGD and colonoscopy performed by Dr. Ardis Hughs on 05/17/2021.  ? ?EGD with very mild, nonspecific distal gastritis s/p biopsied, otherwise normal exam.  Gastric biopsy was benign, negative for H. pylori. ? ? ?Colonoscopy with mild erythema and granularity of the colon mucosa, possibly more significant on the left than the right side. Biopsies taken to check for chronic inflammation versus possible prep effect. Internal hemorrhoids. The examination was otherwise normal on direct and retroflexion views.  Colon biopsies were benign.  Overall, suspected rectal bleeding likely secondary to hemorrhoids. ? ? ?Courtney:  ?Please let patient know I have reviewed his EGD and colonoscopy completed with Dr. Ardis Hughs.  Overall, procedures looked good.  He does have hemorrhoids and this is suspected to be the cause of his rectal bleeding. ? ?Is he still having rectal bleeding?  If so, I can send in a hemorrhoid cream for him.  If he continues to have rectal bleeding despite rectal creams, we can consider hemorrhoid banding in the future. ? ?We also need to get him scheduled for an in person follow-up visit in the next 4 weeks.  ? ? ?Stacey/Susan:  ?Please arrange follow-up in person in the next 4 weeks. ?

## 2021-05-21 NOTE — Telephone Encounter (Signed)
Requested medications are due for refill today.  unsure ? ?Requested medications are on the active medications list.  Diflucan is, Levaquin is not. ? ?Last refill. Diflucan - 03/19/2021 34 0 refills. Levaquin 06/06/2020 #7 0 refills ? ?Future visit scheduled.   no ? ?Notes to clinic.  Levaquin was d/c'd 06/12/2020. Diflucan was reported "pt not taking" 05/10/2021. Neither medication has a protocol - needs manual review. ? ? ? ?Requested Prescriptions  ?Pending Prescriptions Disp Refills  ? levofloxacin (LEVAQUIN) 500 MG tablet [Pharmacy Med Name: LEVOFLOXACIN 500MG  TABLETS] 7 tablet 0  ?  Sig: TAKE 1 TABLET(500 MG) BY MOUTH DAILY FOR 7 DAYS  ?  ? Off-Protocol Failed - 05/19/2021  3:34 AM  ?  ?  Failed - Medication not assigned to a protocol, review manually.  ?  ?  Passed - Valid encounter within last 12 months  ?  Recent Outpatient Visits   ? ?      ? 2 months ago Type 2 diabetes mellitus with hyperglycemia, without long-term current use of insulin (HCC)  ? [facility] [place], [facility], MD  ? 7 months ago Acute midline low back pain, unspecified whether sciatica present  ? [facility] [place], [facility], MD  ? 11 months ago Depression, major, single episode, moderate (HCC)  ? [facility] [place], [facility], MD  ? 11 months ago SOB (shortness of breath) on exertion  ? [facility] [place], [facility], MD  ? 11 months ago Upper respiratory tract infection, unspecified type  ? [facility], NP  ? ?  ?  ? ? ?  ?  ?  ? fluconazole (DIFLUCAN) 150 MG tablet [Pharmacy Med Name: FLUCONAZOLE 150MG  TABLETS] 4 tablet 0  ?  Sig: TAKE 1 TABLET BY MOUTH ONCE A WEEK  ?  ? Off-Protocol Failed - 05/19/2021  3:34 AM  ?  ?  Failed - Medication not assigned to a protocol, review manually.  ?  ?  Passed - Valid encounter within last 12 months  ?  Recent Outpatient Visits   ? ?      ? 2 months ago Type 2 diabetes mellitus with hyperglycemia,  without long-term current use of insulin (HCC)  ? [facility] [place], 05/21/2021, MD  ? 7 months ago Acute midline low back pain, unspecified whether sciatica present  ? [facility] [place], Priscille Heidelberg, MD  ? 11 months ago Depression, major, single episode, moderate (HCC)  ? [facility] [place], Priscille Heidelberg, MD  ? 11 months ago SOB (shortness of breath) on exertion  ? [facility] [place], Priscille Heidelberg, MD  ? 11 months ago Upper respiratory tract infection, unspecified type  ? [facility] Priscille Heidelberg, NP  ? ?  ?  ? ? ?  ?  ?  ?  ?

## 2021-05-22 ENCOUNTER — Other Ambulatory Visit (HOSPITAL_COMMUNITY): Payer: Self-pay | Admitting: Family Medicine

## 2021-05-22 ENCOUNTER — Ambulatory Visit (HOSPITAL_COMMUNITY)
Admission: RE | Admit: 2021-05-22 | Discharge: 2021-05-22 | Disposition: A | Discharge status: Home / Self Care | Payer: 59 | Source: Ambulatory Visit | Attending: Family Medicine | Admitting: Family Medicine

## 2021-05-22 ENCOUNTER — Other Ambulatory Visit: Payer: Self-pay | Admitting: Gastroenterology

## 2021-05-22 DIAGNOSIS — M25562 Pain in left knee: Secondary | ICD-10-CM | POA: Insufficient documentation

## 2021-05-22 DIAGNOSIS — M545 Low back pain, unspecified: Secondary | ICD-10-CM | POA: Diagnosis present

## 2021-05-22 DIAGNOSIS — G8929 Other chronic pain: Secondary | ICD-10-CM | POA: Insufficient documentation

## 2021-05-22 DIAGNOSIS — M25561 Pain in right knee: Secondary | ICD-10-CM | POA: Insufficient documentation

## 2021-05-22 DIAGNOSIS — M25551 Pain in right hip: Secondary | ICD-10-CM | POA: Diagnosis not present

## 2021-05-22 DIAGNOSIS — M25572 Pain in left ankle and joints of left foot: Secondary | ICD-10-CM | POA: Diagnosis present

## 2021-05-22 DIAGNOSIS — M25552 Pain in left hip: Secondary | ICD-10-CM | POA: Insufficient documentation

## 2021-05-22 DIAGNOSIS — K649 Unspecified hemorrhoids: Secondary | ICD-10-CM

## 2021-05-22 MED ORDER — HYDROCORTISONE (PERIANAL) 2.5 % EX CREA: 1.0000 "application " | TOPICAL_CREAM | Freq: Two times a day (BID) | CUTANEOUS | 1 refills | Status: DC

## 2021-05-22 NOTE — Telephone Encounter (Signed)
Spoke to pt, informed him of results and recommendations. Pt voiced understanding. Pt stated he is still having rectal bleeding and stomach pains. You can send prescription to [facility] on [address] here in [place].  ?

## 2021-05-22 NOTE — Telephone Encounter (Signed)
Rx sent for ansuol rectal cream. Use BID x 7-10 days, then stop. May use again as needed for rectal bleeding/hemorrhoids.  ?

## 2021-05-22 NOTE — Telephone Encounter (Signed)
Informed pt of how to use rectal cream.  ?

## 2021-05-28 ENCOUNTER — Telehealth: Payer: Self-pay

## 2021-05-28 ENCOUNTER — Encounter: Payer: Self-pay | Admitting: Family Medicine

## 2021-05-28 NOTE — Progress Notes (Signed)
? ? ?Referring Provider: Susy Frizzle, MD ?Primary Care Physician:  Susy Frizzle, MD ?Primary GI Physician: Dr. Abbey Chatters ? ?Chief Complaint  ?Patient presents with  ? Follow-up  ? ? ?HPI:   ?Ricardo Petro. is a 46 y.o. male presenting today for follow-up of anemia, rectal bleeding, GERD, and upper abdominal pain s/p EGD and colonoscopy completed by Dr. Ardis Hughs as patient was not a candidate for procedures at [facility] due to morbid obesity. ? ?Procedures completed 05/17/2021. ?EGD with very mild, nonspecific distal gastritis s/p biopsied, otherwise normal exam.  Gastric biopsy was benign, negative for H. pylori. ?  ?Colonoscopy with mild erythema and granularity of the colon mucosa, possibly more significant on the left than the right side. Biopsies taken to check for chronic inflammation versus possible prep effect. Internal hemorrhoids. The examination was otherwise normal on direct and retroflexion views.  Colon biopsies were benign.  Overall, suspected rectal bleeding likely secondary to hemorrhoids. ? ?He reported ongoing rectal bleeding and Anusol rectal cream was sent to his pharmacy on 5/2. ? ?Last seen in our office 02/21/2021.  Noted chronic history of microcytic anemia dating back at least to 2019 with hemoglobin in the 9-11 range.  Previously, iron saturation low in September 2021, but normal in September 2022 though he was on iron supplementation for at least 6 months at that time.  He reported intermittent bright red blood per rectum every few days with or without a bowel movement since September 2021 without associated rectal pain.  Also with intermittent upper abdominal pain, primarily epigastric since September 2021 with associated heartburn, nausea without vomiting.  Some improvement with over-the-counter Prilosec as needed.  Was taking meloxicam daily since July 2022.  Plan to start him on Protonix daily, discontinue NSAIDs, and proceed with EGD and colonoscopy.  CBC and iron  panel also updated. ? ?Labs revealed hemoglobin of 9.3 which was stable.  Microcytic indices.  Iron panel with saturation low at 18%, iron low normal at 57, ferritin 94. ? ?Today: ? ?Rectal pain:  ?Continues. Every day. No constipation. Sometimes will sit to 15-20 minutes just sitting. Using anusol once daily. No improvement yet.  ? ?Epigastric pain/GERD/nausea: ?Not necessarily affected by eating. About the same. Burning. Couple days a week. Heartburn several days a week. Protonix helps in the morning, but symptoms return in the evening. Little nausea. No vomiting. No melena.  ? ?Stopped meloxicam.   ?Still taking iron daily.  ? ?Plans to start appointments/work-up for consideration of bariatric surgery. ? ?IDA:  ? ? ??Capsule ? ?Past Medical History:  ?Diagnosis Date  ? Anemia   ? Anxiety and depression   ? B12 deficiency   ? Back pain   ? COVID   ? 9/21- hypoxia requiring oxygen  ? Diabetes mellitus without complication ([place])   ? Frequency of urination   ? GERD (gastroesophageal reflux disease)   ? Gluteal abscess   ? ecoli after covid hospitalization 2021  ? Headache   ? Hypertension   ? Obesity   ? Sleep apnea   ? Stomach ulcer   ? Tachycardia   ? Thoracic aortic aneurysm ([place])   ? ? ?Past Surgical History:  ?Procedure Laterality Date  ? BIOPSY  05/17/2021  ? Procedure: BIOPSY;  Surgeon: Milus Banister, MD;  Location: [facility];  Service: Gastroenterology;;  EGD and COLON  ? COLONOSCOPY WITH PROPOFOL N/A 05/17/2021  ? Surgeon: Milus Banister, MD; mild erythema and granularity of the colonic mucosa  with benign biopsies, internal hemorrhoids suspected to be etiology of rectal bleeding.  ? ESOPHAGOGASTRODUODENOSCOPY (EGD) WITH PROPOFOL N/A 05/17/2021  ? Surgeon: Milus Banister, MD; very mild, nonspecific distal gastritis s/p biopsied, otherwise normal exam.  Gastric biopsy was benign, negative for H. pylori.  ? ? ?Current Outpatient Medications  ?Medication Sig Dispense Refill  ? acetaminophen (TYLENOL)  325 MG tablet Take 650 mg by mouth every 6 (six) hours as needed for moderate pain.    ? albuterol (VENTOLIN HFA) 108 (90 Base) MCG/ACT inhaler Inhale 2 puffs into the lungs every 4 (four) hours as needed for wheezing or shortness of breath. 18 g 2  ? ascorbic acid (VITAMIN C) 500 MG tablet Take 1 tablet (500 mg total) by mouth daily. 30 tablet 1  ? atorvastatin (LIPITOR) 10 MG tablet Take 1 tablet (10 mg total) by mouth daily. 90 tablet 3  ? Blood Glucose Monitoring Suppl (ONE TOUCH ULTRA 2) w/Device KIT 1 each by Does not apply route 3 (three) times daily. 1 kit 1  ? buPROPion (WELLBUTRIN XL) 150 MG 24 hr tablet TAKE 1 TABLET(150 MG) BY MOUTH DAILY 30 tablet 5  ? cetirizine (ZYRTEC) 10 MG tablet TAKE ONE TABLET BY MOUTH DAILY 90 tablet 3  ? Cholecalciferol (VITAMIN D) 50 MCG (2000 UT) tablet Take 2,000 Units by mouth daily.    ? escitalopram (LEXAPRO) 20 MG tablet TAKE 1 TABLET(20 MG) BY MOUTH DAILY 30 tablet 5  ? FEROSUL 325 (65 Fe) MG tablet TAKE 1 TABLET BY MOUTH EVERY MORNING 30 tablet 3  ? fluticasone (FLONASE) 50 MCG/ACT nasal spray Place 2 sprays into both nostrils daily. (Patient taking differently: Place 2 sprays into both nostrils daily as needed for allergies.) 16 g 6  ? furosemide (LASIX) 80 MG tablet Take 1 tablet (80 mg total) by mouth 2 (two) times daily. 180 tablet 3  ? glucose blood (ONETOUCH ULTRA) test strip 1 each by Other route 3 (three) times daily. Use to check blood sugar three times daily as instructed 100 each 11  ? hydrocortisone (ANUSOL-HC) 2.5 % rectal cream Place 1 application. rectally 2 (two) times daily. for 7-10 days then stop. May use again as needed for rectal bleeding/hemorrhoids. 30 g 1  ? Lancets MISC Check fbs daily 100 each 11  ? metoprolol succinate (TOPROL-XL) 100 MG 24 hr tablet Take 1 tablet (100 mg total) by mouth daily. 90 tablet 2  ? Multiple Vitamin (MULTIVITAMIN) tablet Take 1 tablet by mouth daily.    ? oxyCODONE-acetaminophen (PERCOCET) 10-325 MG tablet Take 1  tablet by mouth 4 (four) times daily as needed.    ? pantoprazole (PROTONIX) 40 MG tablet Take 1 tablet (40 mg total) by mouth 2 (two) times daily before a meal. 60 tablet 3  ? potassium chloride SA (KLOR-CON M) 20 MEQ tablet Take 20 mEq by mouth 2 (two) times daily.    ? prazosin (MINIPRESS) 2 MG capsule TAKE 1 CAPSULE(2 MG) BY MOUTH AT BEDTIME 90 capsule 1  ? Semaglutide, 1 MG/DOSE, (OZEMPIC, 1 MG/DOSE,) 2 MG/1.5ML SOPN Inject 1 mg into the skin once a week. (Patient taking differently: Inject 1 mg into the skin every Tuesday.) 3 mL 3  ? sodium chloride (OCEAN) 0.65 % SOLN nasal spray Place 1 spray into both nostrils as needed for congestion. 88 mL 0  ? zolpidem (AMBIEN) 10 MG tablet TAKE 1 TABLET(10 MG) BY MOUTH AT BEDTIME AS NEEDED FOR SLEEP (Patient taking differently: Take 10 mg by mouth at bedtime.)  30 tablet 3  ? clotrimazole-betamethasone (LOTRISONE) cream Apply 1 application topically 2 (two) times daily. (Patient taking differently: Apply 1 application. topically 2 (two) times daily as needed (irritation).) 45 g 1  ? ?No current facility-administered medications for this visit.  ? ? ?Allergies as of 05/30/2021  ? (No Known Allergies)  ? ? ?Family History  ?Problem Relation Age of Onset  ? Diabetes Mother   ? Hypertension Mother   ? Hyperlipidemia Mother   ? Thyroid disease Mother   ? Diabetes Father   ? Hyperlipidemia Father   ? Hypertension Father   ? Depression Father   ? Anxiety disorder Father   ? Sleep apnea Father   ? Colon cancer Neg Hx   ? Stomach cancer Neg Hx   ? ? ?Social History  ? ?Socioeconomic History  ? Marital status: Married  ?  Spouse name: Not on file  ? Number of children: Not on file  ? Years of education: Not on file  ? Highest education level: Not on file  ?Occupational History  ? Occupation: stay at home spouse  ?Tobacco Use  ? Smoking status: Never  ? Smokeless tobacco: Never  ?Vaping Use  ? Vaping Use: Never used  ?Substance and Sexual Activity  ? Alcohol use: Not Currently  ?   Comment: occasional  ? Drug use: Yes  ?  Types: Marijuana  ?  Comment: occasionally  ? Sexual activity: Yes  ?Other Topics Concern  ? Not on file  ?Social History Narrative  ? Lives at home with wife and t

## 2021-05-28 NOTE — Telephone Encounter (Signed)
Patient's wife called to state she dropped off forms from [facility] last week. They need these completed to try and get reimbursement for a portable O2 concentrator they bought out of pocket.  ? ?Forms are not in Dr. Caren Macadam "To Be Signed" folder. ? ?Called [employer] to inquire about form. Per agent this needs to be completed by the entity that filed the original claim - [employer]. [employer] needs a modifier to state whether the equipment is a rental or purchased POC. This information must come from the original claimant.  ? ?[facility] to advise patient they will need to make sure [facility] send form to company they purchased equipment from ?

## 2021-05-28 NOTE — Telephone Encounter (Signed)
Spoke with patient's wife and informed. She will contact sales company and have them submit addended claim.  ?

## 2021-05-30 ENCOUNTER — Encounter: Payer: Self-pay | Admitting: Gastroenterology

## 2021-05-30 ENCOUNTER — Ambulatory Visit: Payer: 59 | Admitting: Gastroenterology

## 2021-05-30 VITALS — BP 144/82 | HR 106 | Temp 97.5°F | Ht 68.0 in | Wt >= 6400 oz

## 2021-05-30 DIAGNOSIS — D649 Anemia, unspecified: Secondary | ICD-10-CM | POA: Diagnosis not present

## 2021-05-30 DIAGNOSIS — K625 Hemorrhage of anus and rectum: Secondary | ICD-10-CM

## 2021-05-30 DIAGNOSIS — K649 Unspecified hemorrhoids: Secondary | ICD-10-CM

## 2021-05-30 DIAGNOSIS — R1013 Epigastric pain: Secondary | ICD-10-CM

## 2021-05-30 DIAGNOSIS — K219 Gastro-esophageal reflux disease without esophagitis: Secondary | ICD-10-CM | POA: Diagnosis not present

## 2021-05-30 MED ORDER — PANTOPRAZOLE SODIUM 40 MG PO TBEC: 40.0000 mg | DELAYED_RELEASE_TABLET | Freq: Two times a day (BID) | ORAL | 3 refills | Status: DC

## 2021-05-30 NOTE — Patient Instructions (Addendum)
Please have blood work completed at [facility]. ? ?Use Anusol rectal cream twice daily for rectal bleeding related to hemorrhoids. ? ?We are going to arrange for you to have an appointment with Roseanne Kaufman, NP for consideration of hemorrhoid banding. ? ?Increase Protonix to 40 mg twice daily 30 minutes before breakfast and dinner.  I have sent a new prescription to your pharmacy. ? ?Follow a GERD diet:  ?Avoid fried, fatty, greasy, spicy, citrus foods. ?Avoid caffeine and carbonated beverages. ?Avoid chocolate. ?Try eating 4-6 small meals a day rather than 3 large meals. ?Do not eat within 3 hours of laying down. ?Prop head of bed up on wood or bricks to create a 6 inch incline. ? ?Continue to avoid NSAID products including ibuprofen, Aleve, Advil, meloxicam, naproxen, BC powders, Goody powders, and anything that says "NSAID" on the package. ? ?I will follow-up with you in the office in 2-3 months. ? ?It was good to see you again today! ? ?Aliene Altes, PA-C ?[facility] ? ? ? ? ?

## 2021-05-31 ENCOUNTER — Encounter: Payer: Self-pay | Admitting: Gastroenterology

## 2021-06-06 ENCOUNTER — Other Ambulatory Visit: Payer: Self-pay | Admitting: Family Medicine

## 2021-06-06 NOTE — Telephone Encounter (Signed)
Requested Prescriptions  ?Pending Prescriptions Disp Refills  ?? FEROSUL 325 (65 Fe) MG tablet [Pharmacy Med Name: FERROUS SULFATE 325MG  (5GR) TABS] 30 tablet 3  ?  Sig: TAKE 1 TABLET BY MOUTH EVERY MORNING  ?  ? Endocrinology:  Minerals - Iron Supplementation Failed - 06/06/2021  6:21 AM  ?  ?  Failed - HGB in normal range and within 360 days  ?  Hemoglobin  ?Date Value Ref Range Status  ?02/23/2021 9.3 (L) 13.2 - 17.1 g/dL Final  ?04/23/2021 74/08/1446 (L) 13.0 - 17.7 g/dL Final  ?   ?  ?  Failed - HCT in normal range and within 360 days  ?  HCT  ?Date Value Ref Range Status  ?02/23/2021 31.6 (L) 38.5 - 50.0 % Final  ? ?Hematocrit  ?Date Value Ref Range Status  ?05/18/2020 34.9 (L) 37.5 - 51.0 % Final  ?   ?  ?  Passed - RBC in normal range and within 360 days  ?  RBC  ?Date Value Ref Range Status  ?02/23/2021 4.57 4.20 - 5.80 Million/uL Final  ?   ?  ?  Passed - Fe (serum) in normal range and within 360 days  ?  Iron  ?Date Value Ref Range Status  ?02/23/2021 57 50 - 180 mcg/dL Final  ?04/23/2021 58 38 - 169 ug/dL Final  ? ?%SAT  ?Date Value Ref Range Status  ?02/23/2021 18 (L) 20 - 48 % (calc) Final  ?   ?  ?  Passed - Ferritin in normal range and within 360 days  ?  Ferritin  ?Date Value Ref Range Status  ?02/23/2021 94 38 - 380 ng/mL Final  ?05/18/2020 125 30 - 400 ng/mL Final  ?   ?  ?  Passed - Valid encounter within last 12 months  ?  Recent Outpatient Visits   ?      ? 2 months ago Type 2 diabetes mellitus with hyperglycemia, without long-term current use of insulin (HCC)  ? [facility] [place], [facility], MD  ? 8 months ago Acute midline low back pain, unspecified whether sciatica present  ? [facility] [place], [facility], MD  ? 11 months ago Depression, major, single episode, moderate (HCC)  ? [facility] [place], [facility], MD  ? 1 year ago SOB (shortness of breath) on exertion  ? [facility] [place], [facility], MD  ? 1 year ago Upper respiratory  tract infection, unspecified type  ? [facility], NP  ?  ?  ? ?  ?  ?  ? ?

## 2021-06-09 ENCOUNTER — Other Ambulatory Visit: Payer: Self-pay | Admitting: Gastroenterology

## 2021-06-09 DIAGNOSIS — R1013 Epigastric pain: Secondary | ICD-10-CM

## 2021-06-09 DIAGNOSIS — K219 Gastro-esophageal reflux disease without esophagitis: Secondary | ICD-10-CM

## 2021-06-16 ENCOUNTER — Other Ambulatory Visit: Payer: Self-pay | Admitting: Family Medicine

## 2021-06-17 ENCOUNTER — Other Ambulatory Visit: Payer: Self-pay | Admitting: Nurse Practitioner

## 2021-06-17 DIAGNOSIS — R0982 Postnasal drip: Secondary | ICD-10-CM

## 2021-06-19 ENCOUNTER — Other Ambulatory Visit: Payer: Self-pay | Admitting: Nurse Practitioner

## 2021-06-19 DIAGNOSIS — R0982 Postnasal drip: Secondary | ICD-10-CM

## 2021-06-19 NOTE — Telephone Encounter (Signed)
rx was sent to pharmacy by provider today #16g/3 E-Prescribing Status: Receipt confirmed by pharmacy (06/19/2021 2:41 PM EDT)     Requested Prescriptions  Pending Prescriptions Disp Refills  . fluticasone (FLONASE) 50 MCG/ACT nasal spray [Pharmacy Med Name: FLUTICASONE NASAL SP (120) RX] 16 g 6    Sig: PLACE 2 SPRAYS INTO BOTH NOSTRILS DAILY     Ear, Nose, and Throat: Nasal Preparations - Corticosteroids Passed - 06/17/2021  3:33 AM      Passed - Valid encounter within last 12 months    Recent Outpatient Visits          3 months ago Type 2 diabetes mellitus with hyperglycemia, without long-term current use of insulin (HCC)   [facility] [place], Priscille Heidelberg, MD   8 months ago Acute midline low back pain, unspecified whether sciatica present   [facility] [name], MD   1 year ago Depression, major, single episode, moderate (HCC)   [facility] [place], Priscille Heidelberg, MD   1 year ago SOB (shortness of breath) on exertion   [facility] [place], Priscille Heidelberg, MD   1 year ago Upper respiratory tract infection, unspecified type   [facility] Medicine Valentino Nose, NP

## 2021-06-19 NOTE — Telephone Encounter (Signed)
Last filled 05/15/21 Last OV 02/27/21 Next OV none

## 2021-06-28 ENCOUNTER — Encounter: Payer: Self-pay | Admitting: Family Medicine

## 2021-06-28 ENCOUNTER — Other Ambulatory Visit: Payer: Self-pay

## 2021-06-28 MED ORDER — FLUCONAZOLE 150 MG PO TABS: 150.0000 mg | ORAL_TABLET | ORAL | 0 refills | Status: DC

## 2021-07-05 ENCOUNTER — Other Ambulatory Visit (HOSPITAL_COMMUNITY): Payer: Self-pay

## 2021-08-02 ENCOUNTER — Other Ambulatory Visit (HOSPITAL_COMMUNITY): Payer: Self-pay

## 2021-08-03 ENCOUNTER — Other Ambulatory Visit (HOSPITAL_COMMUNITY): Payer: Self-pay

## 2021-08-03 MED ORDER — OXYCODONE HCL 10 MG PO TABS
10.0000 mg | ORAL_TABLET | Freq: Three times a day (TID) | ORAL | 0 refills | Status: DC | PRN
  Filled 2021-08-03: qty 180, 30d supply, fill #0

## 2021-08-06 ENCOUNTER — Encounter: Payer: Self-pay | Admitting: Internal Medicine

## 2021-08-10 ENCOUNTER — Encounter: Payer: Self-pay | Admitting: Family Medicine

## 2021-08-10 MED ORDER — ALBUTEROL SULFATE HFA 108 (90 BASE) MCG/ACT IN AERS: 2.0000 | INHALATION_SPRAY | RESPIRATORY_TRACT | 2 refills | Status: DC | PRN

## 2021-08-11 LAB — CBC WITH DIFFERENTIAL/PLATELET
Absolute Monocytes: 680 cells/uL (ref 200–950)
Basophils Absolute: 32 cells/uL (ref 0–200)
Basophils Relative: 0.4 %
Eosinophils Absolute: 81 cells/uL (ref 15–500)
Eosinophils Relative: 1 %
HCT: 31.2 % — ABNORMAL LOW (ref 38.5–50.0)
Hemoglobin: 8.9 g/dL — ABNORMAL LOW (ref 13.2–17.1)
Lymphs Abs: 2430 cells/uL (ref 850–3900)
MCH: 19.8 pg — ABNORMAL LOW (ref 27.0–33.0)
MCHC: 28.5 g/dL — ABNORMAL LOW (ref 32.0–36.0)
MCV: 69.3 fL — ABNORMAL LOW (ref 80.0–100.0)
MPV: 10.5 fL (ref 7.5–12.5)
Monocytes Relative: 8.4 %
Neutro Abs: 4876 cells/uL (ref 1500–7800)
Neutrophils Relative %: 60.2 %
Platelets: 220 10*3/uL (ref 140–400)
RBC: 4.5 10*6/uL (ref 4.20–5.80)
RDW: 15.6 % — ABNORMAL HIGH (ref 11.0–15.0)
Total Lymphocyte: 30 %
WBC: 8.1 10*3/uL (ref 3.8–10.8)

## 2021-08-11 LAB — COMPLETE METABOLIC PANEL WITH GFR
AG Ratio: 1.1 (calc) (ref 1.0–2.5)
ALT: 10 U/L (ref 9–46)
AST: 13 U/L (ref 10–40)
Albumin: 4.1 g/dL (ref 3.6–5.1)
Alkaline phosphatase (APISO): 78 U/L (ref 36–130)
BUN: 12 mg/dL (ref 7–25)
CO2: 32 mmol/L (ref 20–32)
Calcium: 9.9 mg/dL (ref 8.6–10.3)
Chloride: 101 mmol/L (ref 98–110)
Creat: 1.2 mg/dL (ref 0.60–1.29)
Globulin: 3.6 g/dL (calc) (ref 1.9–3.7)
Glucose, Bld: 117 mg/dL — ABNORMAL HIGH (ref 65–99)
Potassium: 4.7 mmol/L (ref 3.5–5.3)
Sodium: 140 mmol/L (ref 135–146)
Total Bilirubin: 0.3 mg/dL (ref 0.2–1.2)
Total Protein: 7.7 g/dL (ref 6.1–8.1)
eGFR: 76 mL/min/{1.73_m2} (ref >=60)

## 2021-08-11 LAB — CBC MORPHOLOGY

## 2021-08-11 LAB — IRON,TIBC AND FERRITIN PANEL
%SAT: 21 % (calc) (ref 20–48)
Ferritin: 66 ng/mL (ref 38–380)
Iron: 64 ug/dL (ref 50–180)
TIBC: 299 mcg/dL (calc) (ref 250–425)

## 2021-08-14 ENCOUNTER — Other Ambulatory Visit: Payer: Self-pay | Admitting: *Deleted

## 2021-08-14 DIAGNOSIS — D649 Anemia, unspecified: Secondary | ICD-10-CM

## 2021-08-14 DIAGNOSIS — K625 Hemorrhage of anus and rectum: Secondary | ICD-10-CM

## 2021-08-20 ENCOUNTER — Other Ambulatory Visit: Payer: Self-pay | Admitting: Family Medicine

## 2021-08-21 NOTE — Telephone Encounter (Signed)
LOV 2./27/23 Last refill 06/19/21, #30, 0 refills  Please review, thanks!

## 2021-08-23 ENCOUNTER — Encounter: Payer: Self-pay | Admitting: Gastroenterology

## 2021-08-23 ENCOUNTER — Ambulatory Visit (INDEPENDENT_AMBULATORY_CARE_PROVIDER_SITE_OTHER): Payer: 59 | Admitting: Gastroenterology

## 2021-08-23 VITALS — BP 185/76 | HR 99 | Temp 97.1°F | Ht 68.0 in | Wt >= 6400 oz

## 2021-08-23 DIAGNOSIS — K625 Hemorrhage of anus and rectum: Secondary | ICD-10-CM | POA: Diagnosis not present

## 2021-08-23 NOTE — Patient Instructions (Signed)
Please continue to avoid straining.  You should limit your toilet time to 2-3 minutes at the most.   Continue to avoid constipation.  Please call me with any concerns or issues!  I will see you in follow-up for additional banding in several weeks.  It was a pleasure to see you today. I want to create trusting relationships with patients to provide genuine, compassionate, and quality care. I value your feedback. If you receive a survey regarding your visit,  I greatly appreciate you taking time to fill this out.   Eduardo Wurth W. Lamanda Rudder, PhD, ANP-BC [facility]         

## 2021-08-23 NOTE — Progress Notes (Signed)
Colonoscopy April 2023 with internal hemorrhoids.      [facility] BANDING PROCEDURE NOTE  Ricardo Lopez. is a 46 y.o. male presenting today for consideration of hemorrhoid banding. Last colonoscopy April 2023 with internal hemorrhoids. He notes bleeding, itching, burning.    The patient presents with symptomatic grade 2 hemorrhoids, unresponsive to maximal medical therapy, requesting rubber band ligation of his hemorrhoidal disease. All risks, benefits, and alternative forms of therapy were described and informed consent was obtained.  The decision was made to band neutrally, as due to size it was difficult to angle the ligator well, and the [facility] System was used to perform band ligation without complication. Digital anorectal examination was then performed to assure proper positioning of the band, and to adjust the banded tissue as required. The patient was discharged home without pain or other issues. Dietary and behavioral recommendations were given, along with follow-up instructions. The patient will return in several weeks for followup and possible additional banding as required.  No complications were encountered and the patient tolerated the procedure well.   Ricardo Mink, PhD, ANP-BC [facility]

## 2021-08-27 ENCOUNTER — Other Ambulatory Visit (HOSPITAL_COMMUNITY): Payer: Self-pay

## 2021-08-27 MED ORDER — BUPRENORPHINE 20 MCG/HR TD PTWK
1.0000 | MEDICATED_PATCH | TRANSDERMAL | 0 refills | Status: DC
  Filled 2021-08-27: qty 4, 28d supply, fill #0

## 2021-08-27 MED ORDER — OXYCODONE-ACETAMINOPHEN 10-325 MG PO TABS
1.0000 | ORAL_TABLET | Freq: Three times a day (TID) | ORAL | 0 refills | Status: DC | PRN
  Filled 2021-08-27: qty 180, 30d supply, fill #0

## 2021-08-28 ENCOUNTER — Other Ambulatory Visit (HOSPITAL_COMMUNITY): Payer: Self-pay

## 2021-08-29 ENCOUNTER — Other Ambulatory Visit (HOSPITAL_COMMUNITY): Payer: Self-pay

## 2021-08-29 ENCOUNTER — Encounter (INDEPENDENT_AMBULATORY_CARE_PROVIDER_SITE_OTHER): Payer: Self-pay

## 2021-08-29 MED ORDER — OXYCODONE HCL 10 MG PO TABS
10.0000 mg | ORAL_TABLET | Freq: Three times a day (TID) | ORAL | 0 refills | Status: DC | PRN
  Filled 2021-08-29: qty 180, 30d supply, fill #0
  Filled 2021-08-31: qty 30, 5d supply, fill #0
  Filled 2021-08-31: qty 150, 25d supply, fill #0

## 2021-08-30 ENCOUNTER — Other Ambulatory Visit: Payer: Self-pay | Admitting: Family Medicine

## 2021-08-30 ENCOUNTER — Encounter: Payer: Self-pay | Admitting: Family Medicine

## 2021-08-30 ENCOUNTER — Other Ambulatory Visit (HOSPITAL_COMMUNITY): Payer: Self-pay

## 2021-08-30 MED ORDER — AMOXICILLIN 875 MG PO TABS: 875.0000 mg | ORAL_TABLET | Freq: Two times a day (BID) | ORAL | 0 refills | Status: AC

## 2021-08-31 ENCOUNTER — Other Ambulatory Visit (HOSPITAL_COMMUNITY): Payer: Self-pay

## 2021-08-31 ENCOUNTER — Other Ambulatory Visit: Payer: Self-pay | Admitting: Family Medicine

## 2021-08-31 NOTE — Telephone Encounter (Signed)
Requested Prescriptions  Pending Prescriptions Disp Refills  . prazosin (MINIPRESS) 2 MG capsule [Pharmacy Med Name: PRAZOSIN 2MG  CAPSULES] 90 capsule 1    Sig: TAKE 1 CAPSULE(2 MG) BY MOUTH AT BEDTIME     Cardiovascular:  Alpha Blockers Failed - 08/31/2021  3:33 AM      Failed - Last BP in normal range    BP Readings from Last 1 Encounters:  08/23/21 (!) 185/76         Passed - Valid encounter within last 6 months    Recent Outpatient Visits          5 months ago Type 2 diabetes mellitus with hyperglycemia, without long-term current use of insulin (HCC)   [facility] [place], [facility], MD   11 months ago Acute midline low back pain, unspecified whether sciatica present   [facility], MD   1 year ago Depression, major, single episode, moderate (HCC)   [facility] [place], [facility], MD   1 year ago SOB (shortness of breath) on exertion   [facility] [place], [facility], MD   1 year ago Upper respiratory tract infection, unspecified type   [facility], NP

## 2021-09-06 ENCOUNTER — Ambulatory Visit: Payer: 59 | Admitting: Gastroenterology

## 2021-09-06 ENCOUNTER — Encounter: Payer: Self-pay | Admitting: Gastroenterology

## 2021-09-06 VITALS — BP 180/83 | HR 97 | Temp 97.5°F | Ht 68.0 in | Wt >= 6400 oz

## 2021-09-06 DIAGNOSIS — K649 Unspecified hemorrhoids: Secondary | ICD-10-CM | POA: Diagnosis not present

## 2021-09-06 NOTE — Progress Notes (Signed)
      [facility] BANDING PROCEDURE NOTE  Ricardo Lopez. is a 46 y.o. male presenting today for consideration of hemorrhoid banding. Last colonoscopy April 2023 with internal hemorrhoids. He notes bleeding, itching, burning. He has had neutral banding.    The patient presents with symptomatic grade 2 hemorrhoids, unresponsive to maximal medical therapy, requesting rubber band ligation of his hemorrhoidal disease. All risks, benefits, and alternative forms of therapy were described and informed consent was obtained.   The decision was made to band neutrally due to body habitus; it appeared to be left lateral internal hemorrhoid, and the [facility] System was used to perform band ligation without complication. Digital anorectal examination was then performed to assure proper positioning of the band, and to adjust the banded tissue as required. The patient was discharged home without pain or other issues. Dietary and behavioral recommendations were given, along with follow-up instructions. The patient will return in several weeks for followup and possible additional banding as required.  No complications were encountered and the patient tolerated the procedure well.   [name], PhD, ANP-BC [facility]

## 2021-09-06 NOTE — Patient Instructions (Signed)
Please continue to avoid straining.  You should limit your toilet time to 2-3 minutes at the most.   Continue to avoid constipation.  Please call me with any concerns or issues!  I will see you in follow-up for additional banding in several weeks.    I enjoyed seeing you again today! As you know, I value our relationship and want to provide genuine, compassionate, and quality care. I welcome your feedback. If you receive a survey regarding your visit,  I greatly appreciate you taking time to fill this out. See you next time!  Zaim Nitta W. Erikson Danzy, PhD, ANP-BC [facility]       

## 2021-09-09 ENCOUNTER — Other Ambulatory Visit: Payer: Self-pay | Admitting: General Practice

## 2021-09-20 ENCOUNTER — Encounter: Payer: Self-pay | Admitting: Gastroenterology

## 2021-09-20 ENCOUNTER — Ambulatory Visit (INDEPENDENT_AMBULATORY_CARE_PROVIDER_SITE_OTHER): Payer: 59 | Admitting: Gastroenterology

## 2021-09-20 VITALS — BP 176/99 | HR 113 | Temp 97.8°F | Ht 68.0 in | Wt >= 6400 oz

## 2021-09-20 DIAGNOSIS — K649 Unspecified hemorrhoids: Secondary | ICD-10-CM

## 2021-09-20 NOTE — Patient Instructions (Signed)
  Please avoid straining.  You should limit your toilet time to 2-3 minutes at the most.   I recommend Benefiber 2 teaspoons each morning in the beverage of your choice!  Please call me with any concerns or issues!  I will see you in follow-up for additional banding in several weeks. If you are doing well, you can cancel this.   I enjoyed seeing you again today! As you know, I value our relationship and want to provide genuine, compassionate, and quality care. I welcome your feedback. If you receive a survey regarding your visit,  I greatly appreciate you taking time to fill this out. See you next time!  [name], PhD, ANP-BC [facility]

## 2021-09-20 NOTE — Progress Notes (Signed)
    [facility] BANDING PROCEDURE NOTE  Ricardo Waas. is a 46 y.o. male presenting today for consideration of hemorrhoid banding. Last colonoscopy  April 2023 with internal hemorrhoids. He notes bleeding, itching, burning. He has had neutral banding X 2. Most recent banding appeared to be in left lateral position with palpation. Due to body habitus, it is difficult to angle the ligator appropriately; therefore, I elected to pursue neutrally.    The patient presents with symptomatic grade 2 hemorrhoids, unresponsive to maximal medical therapy, requesting rubber band ligation of his hemorrhoidal disease. All risks, benefits, and alternative forms of therapy were described and informed consent was obtained.  The decision was made to band neutrally, and the [facility] System was used to perform band ligation without complication. Digital anorectal examination was then performed to assure proper positioning of the band, and to adjust the banded tissue as required. The patient was discharged home without pain or other issues. Dietary and behavioral recommendations were given, along with follow-up instructions. The patient will return in several weeks for followup and possible additional banding as required.  No complications were encountered and the patient tolerated the procedure well.   [name], PhD, ANP-BC [facility]

## 2021-09-21 ENCOUNTER — Other Ambulatory Visit: Payer: Self-pay | Admitting: Family Medicine

## 2021-09-21 NOTE — Telephone Encounter (Signed)
Requested medication (s) are due for refill today - yes  Requested medication (s) are on the active medication list -yes  Future visit scheduled -no  Last refill: 03/19/21 45g 1RF  Notes to clinic: medication not assigned protocol- sent for review   Requested Prescriptions  Pending Prescriptions Disp Refills   clotrimazole-betamethasone (LOTRISONE) cream [Pharmacy Med Name: CLOTRIMAZOLE-BETAMETHASONE CRM [id number]] 45 g 1    Sig: APPLY TOPICALLY TO THE AFFECTED AREA TWICE DAILY     Off-Protocol Failed - 09/21/2021 10:33 AM      Failed - Medication not assigned to a protocol, review manually.      Passed - Valid encounter within last 12 months    Recent Outpatient Visits           6 months ago Type 2 diabetes mellitus with hyperglycemia, without long-term current use of insulin (HCC)   [facility] Pickard, Priscille Heidelberg, MD   11 months ago Acute midline low back pain, unspecified whether sciatica present   [facility] Pickard, Priscille Heidelberg, MD   1 year ago Depression, major, single episode, moderate (HCC)   [facility] Pickard, Priscille Heidelberg, MD   1 year ago SOB (shortness of breath) on exertion   [facility] Pickard, Priscille Heidelberg, MD   1 year ago Upper respiratory tract infection, unspecified type   [facility] Valentino Nose, NP                 Requested Prescriptions  Pending Prescriptions Disp Refills   clotrimazole-betamethasone (LOTRISONE) cream [Pharmacy Med Name: CLOTRIMAZOLE-BETAMETHASONE CRM [id number]] 45 g 1    Sig: APPLY TOPICALLY TO THE AFFECTED AREA TWICE DAILY     Off-Protocol Failed - 09/21/2021 10:33 AM      Failed - Medication not assigned to a protocol, review manually.      Passed - Valid encounter within last 12 months    Recent Outpatient Visits           6 months ago Type 2 diabetes mellitus with hyperglycemia, without long-term current use of insulin (HCC)   [facility]  Medicine Pickard, Priscille Heidelberg, MD   11 months ago Acute midline low back pain, unspecified whether sciatica present   [facility] [name], MD   1 year ago Depression, major, single episode, moderate (HCC)   [facility] Pickard, Priscille Heidelberg, MD   1 year ago SOB (shortness of breath) on exertion   [facility] Tanya Nones Priscille Heidelberg, MD   1 year ago Upper respiratory tract infection, unspecified type   [facility] Valentino Nose, NP

## 2021-09-25 ENCOUNTER — Other Ambulatory Visit: Payer: Self-pay | Admitting: Family Medicine

## 2021-09-26 ENCOUNTER — Other Ambulatory Visit (HOSPITAL_COMMUNITY): Payer: Self-pay

## 2021-09-26 MED ORDER — BELBUCA 75 MCG BU FILM
75.0000 ug | ORAL_FILM | Freq: Two times a day (BID) | BUCCAL | 0 refills | Status: DC
  Filled 2021-09-26 (×3): qty 60, 30d supply, fill #0

## 2021-09-26 MED ORDER — OXYCODONE-ACETAMINOPHEN 10-325 MG PO TABS
1.0000 | ORAL_TABLET | Freq: Three times a day (TID) | ORAL | 0 refills | Status: DC | PRN
  Filled 2021-09-26 – 2021-10-01 (×2): qty 180, 30d supply, fill #0

## 2021-09-28 ENCOUNTER — Other Ambulatory Visit (HOSPITAL_COMMUNITY): Payer: Self-pay

## 2021-09-28 MED ORDER — BUPRENORPHINE 20 MCG/HR TD PTWK
1.0000 | MEDICATED_PATCH | TRANSDERMAL | 0 refills | Status: DC
  Filled 2021-09-28: qty 4, 28d supply, fill #0

## 2021-10-01 ENCOUNTER — Other Ambulatory Visit (HOSPITAL_COMMUNITY): Payer: Self-pay

## 2021-10-04 ENCOUNTER — Encounter: Payer: 59 | Admitting: Gastroenterology

## 2021-10-04 NOTE — Progress Notes (Deleted)
Last colonoscopy  April 2023 with internal hemorrhoids. He notes bleeding, itching, burning. He has had banding X 3.

## 2021-10-09 ENCOUNTER — Other Ambulatory Visit: Payer: Self-pay | Admitting: Family Medicine

## 2021-10-10 ENCOUNTER — Other Ambulatory Visit: Payer: Self-pay | Admitting: Family Medicine

## 2021-10-10 ENCOUNTER — Other Ambulatory Visit: Payer: Self-pay | Admitting: Gastroenterology

## 2021-10-10 DIAGNOSIS — R1013 Epigastric pain: Secondary | ICD-10-CM

## 2021-10-10 DIAGNOSIS — K219 Gastro-esophageal reflux disease without esophagitis: Secondary | ICD-10-CM

## 2021-10-11 NOTE — Telephone Encounter (Signed)
Called patient to schedule appt for medication refills. Patient reports he needs his wife to schedule due to her work schedule to bring him. Reports wife will call back to schedule appt.

## 2021-10-11 NOTE — Telephone Encounter (Signed)
Requested medication (s) are due for refill today: historical provider   Requested medication (s) are on the active medication list: yes   Last refill:  historical provider   Future visit scheduled: no  Notes to clinic:  historical provider. Called patient to schedule appt for medication refills. Reports wife will call back to schedule due to her work schedule. Do you want to order Rx? Patient reports PCP "can up " his medication .     Requested Prescriptions  Pending Prescriptions Disp Refills   TRULICITY 3 ZO/1.0RU SOPN [Pharmacy Med Name: TRULICITY 3MG /0.5ML SDP 0.5ML] 2 mL     Sig: ADMINISTER 3 MG UNDER THE SKIN 1 TIME A WEEK AS DIRECTED     Endocrinology:  Diabetes - GLP-1 Receptor Agonists Failed - 10/11/2021  9:17 AM      Failed - HBA1C is between 0 and 7.9 and within 180 days    Hgb A1c MFr Bld  Date Value Ref Range Status  10/02/2020 6.1 (H) <5.7 % of total Hgb Final    Comment:    For someone without known diabetes, a hemoglobin  A1c value between 5.7% and 6.4% is consistent with prediabetes and should be confirmed with a  follow-up test. . For someone with known diabetes, a value <7% indicates that their diabetes is well controlled. A1c targets should be individualized based on duration of diabetes, age, comorbid conditions, and other considerations. . This assay result is consistent with an increased risk of diabetes. . Currently, no consensus exists regarding use of hemoglobin A1c for diagnosis of diabetes for children. .          Failed - Valid encounter within last 6 months    Recent Outpatient Visits           6 months ago Type 2 diabetes mellitus with hyperglycemia, without long-term current use of insulin ([place])   [place] Pickard, Cammie Mcgee, MD   1 year ago Acute midline low back pain, unspecified whether sciatica present   [facility] Susy Frizzle, MD   1 year ago Depression, major, single episode, moderate  ([place])   [place] Pickard, Cammie Mcgee, MD   1 year ago SOB (shortness of breath) on exertion   [place] Pickard, Cammie Mcgee, MD   1 year ago Upper respiratory tract infection, unspecified type   [facility] Eulogio Bear, NP

## 2021-10-17 ENCOUNTER — Telehealth: Payer: Self-pay

## 2021-10-17 DIAGNOSIS — R911 Solitary pulmonary nodule: Secondary | ICD-10-CM

## 2021-10-17 NOTE — Telephone Encounter (Signed)
-----   Message from Timothy Lasso, RN sent at 10/15/2021  9:21 AM EDT -----  ----- Message ----- From: Timothy Lasso, RN Sent: 10/13/2021  12:00 AM EDT To: Timothy Lasso, RN  non-contrast chest CT (per radiologist recommendation) and can you please put in a reminder to order that in 6 months.  Thanks

## 2021-10-17 NOTE — Telephone Encounter (Signed)
The pt has ok'd the non contrast CT for pulmonary nodule.  Order placed and sent to the schedulers

## 2021-10-18 ENCOUNTER — Ambulatory Visit: Payer: 59 | Admitting: Family Medicine

## 2021-10-18 VITALS — BP 138/68 | HR 102 | Temp 98.0°F | Ht 68.0 in | Wt >= 6400 oz

## 2021-10-18 DIAGNOSIS — E1165 Type 2 diabetes mellitus with hyperglycemia: Secondary | ICD-10-CM | POA: Diagnosis not present

## 2021-10-18 DIAGNOSIS — E1169 Type 2 diabetes mellitus with other specified complication: Secondary | ICD-10-CM | POA: Diagnosis not present

## 2021-10-18 DIAGNOSIS — Z6841 Body Mass Index (BMI) 40.0 and over, adult: Secondary | ICD-10-CM

## 2021-10-18 DIAGNOSIS — Z23 Encounter for immunization: Secondary | ICD-10-CM

## 2021-10-18 DIAGNOSIS — D649 Anemia, unspecified: Secondary | ICD-10-CM

## 2021-10-18 DIAGNOSIS — E785 Hyperlipidemia, unspecified: Secondary | ICD-10-CM

## 2021-10-18 MED ORDER — CICLOPIROX 8 % EX SOLN: Freq: Every day | CUTANEOUS | 5 refills | Status: AC

## 2021-10-18 MED ORDER — TIRZEPATIDE 10 MG/0.5ML ~~LOC~~ SOAJ: 10.0000 mg | SUBCUTANEOUS | 1 refills | Status: DC

## 2021-10-18 MED ORDER — VENLAFAXINE HCL ER 75 MG PO CP24: 150.0000 mg | ORAL_CAPSULE | Freq: Every day | ORAL | 5 refills | Status: DC

## 2021-10-18 NOTE — Progress Notes (Signed)
Subjective:    Patient ID: Ricardo Grit., male    DOB: [birth year], 46 y.o.   MRN: [id number]  Patient is now seeing a pain specialist at [facility].  He is currently on Butrans.  This is for his osteoarthritis all throughout his body.  I will defer management of his pain to his pain specialist.  However he continues to battle depression.  He does not feel that the combination of Lexapro and Wellbutrin is beneficial.  I believe that there is an underlying psychological component to his inability to lose weight.  He is actually gained weight despite taking Trulicity at its maximum dose 3 mg subcu weekly.  His weight today is up to 530 pounds.  The patient admits that at times he is an emotional train wreck.  At other times he is doing well.  He continues to have nightmares despite taking the prazosin.  His blood pressure today is acceptable.  He is overdue to check fasting lab work regarding his diabetes. Past Medical History:  Diagnosis Date   Anemia    Anxiety and depression    B12 deficiency    Back pain    COVID    9/21- hypoxia requiring oxygen   Diabetes mellitus without complication (HCC)    Frequency of urination    GERD (gastroesophageal reflux disease)    Gluteal abscess    ecoli after covid hospitalization 2021   Headache    Hypertension    Obesity    Sleep apnea    Stomach ulcer    Tachycardia    Thoracic aortic aneurysm [facility])    Past Surgical History:  Procedure Laterality Date   BIOPSY  05/17/2021   Procedure: BIOPSY;  Surgeon: Milus Banister, MD;  Location: [facility];  Service: Gastroenterology;;  EGD and COLON   COLONOSCOPY WITH PROPOFOL N/A 05/17/2021   Surgeon: Milus Banister, MD; mild erythema and granularity of the colonic mucosa with benign biopsies, internal hemorrhoids suspected to be etiology of rectal bleeding.   ESOPHAGOGASTRODUODENOSCOPY (EGD) WITH PROPOFOL N/A 05/17/2021   Surgeon: Milus Banister, MD; very mild, nonspecific  distal gastritis s/p biopsied, otherwise normal exam.  Gastric biopsy was benign, negative for H. pylori.    Current Outpatient Medications on File Prior to Visit  Medication Sig Dispense Refill   acetaminophen (TYLENOL) 325 MG tablet Take 650 mg by mouth every 6 (six) hours as needed for moderate pain.     albuterol (VENTOLIN HFA) 108 (90 Base) MCG/ACT inhaler Inhale 2 puffs into the lungs every 4 (four) hours as needed for wheezing or shortness of breath. 18 g 2   ascorbic acid (VITAMIN C) 500 MG tablet Take 1 tablet (500 mg total) by mouth daily. 30 tablet 1   atorvastatin (LIPITOR) 10 MG tablet Take 1 tablet (10 mg total) by mouth daily. 90 tablet 3   Blood Glucose Monitoring Suppl (ONE TOUCH ULTRA 2) w/Device KIT 1 each by Does not apply route 3 (three) times daily. 1 kit 1   buprenorphine (BUTRANS) 20 MCG/HR PTWK Place 1 patch onto the skin once a week. 4 patch 0   buPROPion (WELLBUTRIN XL) 150 MG 24 hr tablet TAKE 1 TABLET(150 MG) BY MOUTH DAILY 30 tablet 5   cetirizine (ZYRTEC) 10 MG tablet TAKE ONE TABLET BY MOUTH DAILY 90 tablet 3   Cholecalciferol (VITAMIN D) 50 MCG (2000 UT) tablet Take 2,000 Units by mouth daily.     clotrimazole-betamethasone (LOTRISONE) cream APPLY TOPICALLY TO THE  AFFECTED AREA TWICE DAILY 45 g 1   escitalopram (LEXAPRO) 20 MG tablet TAKE 1 TABLET(20 MG) BY MOUTH DAILY 30 tablet 5   FEROSUL 325 (65 Fe) MG tablet TAKE 1 TABLET BY MOUTH EVERY MORNING 30 tablet 3   fluticasone (FLONASE) 50 MCG/ACT nasal spray Place 2 sprays into both nostrils daily as needed for allergies. 16 g 3   glucose blood (ONETOUCH ULTRA) test strip 1 each by Other route 3 (three) times daily. Use to check blood sugar three times daily as instructed 100 each 11   Lancets MISC Check fbs daily 100 each 11   metoprolol succinate (TOPROL-XL) 100 MG 24 hr tablet TAKE 1 TABLET(100 MG) BY MOUTH DAILY 90 tablet 0   Multiple Vitamin (MULTIVITAMIN) tablet Take 1 tablet by mouth daily.     Oxycodone  HCl 10 MG TABS Take 1-2 tablets (10-20 mg total) by mouth 3 (three) times daily as needed. 180 tablet 0   oxyCODONE-acetaminophen (PERCOCET) 10-325 MG tablet Take 1-2 tablets by mouth 3 (three) times daily as needed. 180 tablet 0   pantoprazole (PROTONIX) 40 MG tablet TAKE 1 TABLET(40 MG) BY MOUTH TWICE DAILY BEFORE A MEAL 180 tablet 3   potassium chloride SA (KLOR-CON M) 20 MEQ tablet Take 20 mEq by mouth 2 (two) times daily.     prazosin (MINIPRESS) 2 MG capsule TAKE 1 CAPSULE(2 MG) BY MOUTH AT BEDTIME 90 capsule 0   sodium chloride (OCEAN) 0.65 % SOLN nasal spray Place 1 spray into both nostrils as needed for congestion. 88 mL 0   TRULICITY 3 VQ/2.5ZD SOPN ADMINISTER 3 MG UNDER THE SKIN 1 TIME A WEEK AS DIRECTED 2 mL 1   zolpidem (AMBIEN) 10 MG tablet TAKE 1 TABLET(10 MG) BY MOUTH AT BEDTIME AS NEEDED FOR SLEEP 30 tablet 3   Buprenorphine HCl (BELBUCA) 75 MCG FILM Place 1 film (75 mcg) inside cheek 2 (two) times daily.  (Just at bedtime for 1 week) (Patient not taking: Reported on 10/18/2021) 60 each 0   furosemide (LASIX) 80 MG tablet Take 1 tablet (80 mg total) by mouth 2 (two) times daily. 180 tablet 3   No current facility-administered medications on file prior to visit.   No Known Allergies Social History   Socioeconomic History   Marital status: Married    Spouse name: Not on file   Number of children: Not on file   Years of education: Not on file   Highest education level: Not on file  Occupational History   Occupation: stay at home spouse  Tobacco Use   Smoking status: Never   Smokeless tobacco: Never  Vaping Use   Vaping Use: Never used  Substance and Sexual Activity   Alcohol use: Not Currently    Comment: occasional   Drug use: Not Currently    Types: Marijuana    Comment: occasionally   Sexual activity: Yes  Other Topics Concern   Not on file  Social History Narrative   Lives at home with wife and two children.  Four children in all.     Social Determinants of  Health   Financial Resource Strain: Not on file  Food Insecurity: Not on file  Transportation Needs: Not on file  Physical Activity: Not on file  Stress: Not on file  Social Connections: Not on file  Intimate Partner Violence: Not on file    Review of Systems  Skin:  Positive for rash.  All other systems reviewed and are negative.  Objective:   Physical Exam Vitals reviewed.  Constitutional:      Appearance: He is obese.  Cardiovascular:     Rate and Rhythm: Normal rate and regular rhythm.     Heart sounds: Normal heart sounds.  Pulmonary:     Effort: Pulmonary effort is normal. No respiratory distress.     Breath sounds: Wheezing and rales present. No rhonchi.  Musculoskeletal:        General: Deformity present.     Right lower leg: Edema present.     Left lower leg: Edema present.  Neurological:     Mental Status: He is alert.    Wt Readings from Last 3 Encounters:  10/18/21 (!) 530 lb (240.4 kg)  09/20/21 (!) 530 lb (240.4 kg)  09/06/21 (!) 530 lb (240.4 kg)          Assessment & Plan:   Type 2 diabetes mellitus with hyperglycemia, without long-term current use of insulin (HCC) - Plan: CBC with Differential/Platelet, COMPLETE METABOLIC PANEL WITH GFR, Lipid panel, Hemoglobin A1c  Hyperlipidemia associated with type 2 diabetes mellitus (HCC)  Anemia, unspecified type  Obesity with current BMI of 74.05 I have a long discussion today with the patient and his wife.  I believe that his weight is going to lead to premature death.  I believe his life hangs in the balance of achieving weight loss.  The Trulicity has not been successful so I will switch to Mounjaro 10 mg subcu weekly.  Discontinue Trulicity.  Uptitrate to 15 mg subcu weekly.  However I was honest with the patient and that I feel that there is a psychological component to his excessive eating disorder.  I do not feel that she can maintain a weight greater than 500 pounds without eating excessively  despite his report of eating very little.  I believe that we need to more effectively treat his depression in order to help resolve this.  Therefore I recommended discontinuation of Lexapro and starting Effexor XR 75 mg p.o. every morning and uptitrating to 150 mg p.o. every morning in 2 weeks.  I will check a CBC CMP lipid panel and A1c.  Monitor his anemia.  He is currently seeing GI and undergoing banding of his hemorrhoids as they believe this is the most likely source of his bleeding.  He has had an EGD and a colonoscopy that were clear.  However I was honest with the patient and stated that if he is unable to successfully lose weight with Ricardo Lopez, he needs to consider gastric bypass as I feel that his long term prognosis is grim without weight loss

## 2021-10-19 LAB — COMPLETE METABOLIC PANEL WITH GFR
AG Ratio: 1.3 (calc) (ref 1.0–2.5)
ALT: 10 U/L (ref 9–46)
AST: 12 U/L (ref 10–40)
Albumin: 4.3 g/dL (ref 3.6–5.1)
Alkaline phosphatase (APISO): 77 U/L (ref 36–130)
BUN: 11 mg/dL (ref 7–25)
CO2: 36 mmol/L — ABNORMAL HIGH (ref 20–32)
Calcium: 9.6 mg/dL (ref 8.6–10.3)
Chloride: 102 mmol/L (ref 98–110)
Creat: 1.11 mg/dL (ref 0.60–1.29)
Globulin: 3.4 g/dL (calc) (ref 1.9–3.7)
Glucose, Bld: 126 mg/dL — ABNORMAL HIGH (ref 65–99)
Potassium: 4.3 mmol/L (ref 3.5–5.3)
Sodium: 142 mmol/L (ref 135–146)
Total Bilirubin: 0.2 mg/dL (ref 0.2–1.2)
Total Protein: 7.7 g/dL (ref 6.1–8.1)
eGFR: 83 mL/min/{1.73_m2} (ref >=60)

## 2021-10-19 LAB — CBC WITH DIFFERENTIAL/PLATELET
Absolute Monocytes: 585 cells/uL (ref 200–950)
Basophils Absolute: 23 cells/uL (ref 0–200)
Basophils Relative: 0.3 %
Eosinophils Absolute: 77 cells/uL (ref 15–500)
Eosinophils Relative: 1 %
HCT: 32.6 % — ABNORMAL LOW (ref 38.5–50.0)
Hemoglobin: 9.4 g/dL — ABNORMAL LOW (ref 13.2–17.1)
Lymphs Abs: 2210 cells/uL (ref 850–3900)
MCH: 20.3 pg — ABNORMAL LOW (ref 27.0–33.0)
MCHC: 28.8 g/dL — ABNORMAL LOW (ref 32.0–36.0)
MCV: 70.3 fL — ABNORMAL LOW (ref 80.0–100.0)
MPV: 11.9 fL (ref 7.5–12.5)
Monocytes Relative: 7.6 %
Neutro Abs: 4805 cells/uL (ref 1500–7800)
Neutrophils Relative %: 62.4 %
Platelets: 220 10*3/uL (ref 140–400)
RBC: 4.64 10*6/uL (ref 4.20–5.80)
RDW: 15.7 % — ABNORMAL HIGH (ref 11.0–15.0)
Total Lymphocyte: 28.7 %
WBC: 7.7 10*3/uL (ref 3.8–10.8)

## 2021-10-19 LAB — LIPID PANEL
Cholesterol: 199 mg/dL (ref <200)
HDL: 41 mg/dL (ref >=40)
LDL Cholesterol (Calc): 122 mg/dL (calc) — ABNORMAL HIGH
Non-HDL Cholesterol (Calc): 158 mg/dL (calc) — ABNORMAL HIGH (ref <130)
Total CHOL/HDL Ratio: 4.9 (calc) (ref <5.0)
Triglycerides: 235 mg/dL — ABNORMAL HIGH (ref <150)

## 2021-10-19 LAB — HEMOGLOBIN A1C
Hgb A1c MFr Bld: 6.3 % of total Hgb — ABNORMAL HIGH (ref <5.7)
Mean Plasma Glucose: 134 mg/dL
eAG (mmol/L): 7.4 mmol/L

## 2021-10-19 NOTE — Addendum Note (Signed)
Addended by: Randal Buba K on: 10/19/2021 09:37 AM   Modules accepted: Orders

## 2021-10-23 ENCOUNTER — Other Ambulatory Visit: Payer: 59

## 2021-10-23 DIAGNOSIS — G4733 Obstructive sleep apnea (adult) (pediatric): Secondary | ICD-10-CM

## 2021-10-23 DIAGNOSIS — D649 Anemia, unspecified: Secondary | ICD-10-CM

## 2021-10-24 ENCOUNTER — Other Ambulatory Visit (HOSPITAL_COMMUNITY): Payer: Self-pay

## 2021-10-24 LAB — IRON,TIBC AND FERRITIN PANEL
%SAT: 21 % (calc) (ref 20–48)
Ferritin: 74 ng/mL (ref 38–380)
Iron: 59 ug/dL (ref 50–180)
TIBC: 276 mcg/dL (calc) (ref 250–425)

## 2021-10-24 MED ORDER — OXYCODONE-ACETAMINOPHEN 10-325 MG PO TABS
1.0000 | ORAL_TABLET | Freq: Three times a day (TID) | ORAL | 0 refills | Status: DC | PRN
  Filled 2021-11-26: qty 180, 30d supply, fill #0

## 2021-10-24 MED ORDER — BUPRENORPHINE 20 MCG/HR TD PTWK
1.0000 | MEDICATED_PATCH | TRANSDERMAL | 0 refills | Status: DC
  Filled 2021-10-24 – 2021-10-31 (×5): qty 4, 28d supply, fill #0

## 2021-10-24 MED ORDER — OXYCODONE HCL 10 MG PO TABS
10.0000 mg | ORAL_TABLET | Freq: Three times a day (TID) | ORAL | 0 refills | Status: DC | PRN
  Filled 2021-10-24: qty 180, 30d supply, fill #0

## 2021-10-25 ENCOUNTER — Encounter: Payer: Self-pay | Admitting: Family Medicine

## 2021-10-25 ENCOUNTER — Encounter: Payer: 59 | Admitting: Gastroenterology

## 2021-10-25 ENCOUNTER — Other Ambulatory Visit (HOSPITAL_COMMUNITY): Payer: Self-pay

## 2021-10-25 ENCOUNTER — Other Ambulatory Visit: Payer: Self-pay | Admitting: Cardiology

## 2021-10-26 ENCOUNTER — Other Ambulatory Visit (HOSPITAL_COMMUNITY): Payer: Self-pay

## 2021-10-29 ENCOUNTER — Other Ambulatory Visit (HOSPITAL_COMMUNITY): Payer: Self-pay

## 2021-10-29 ENCOUNTER — Other Ambulatory Visit: Payer: Self-pay | Admitting: Family Medicine

## 2021-10-29 ENCOUNTER — Other Ambulatory Visit: Payer: Self-pay

## 2021-10-29 MED ORDER — TIRZEPATIDE 12.5 MG/0.5ML ~~LOC~~ SOAJ
12.5000 mg | SUBCUTANEOUS | 3 refills | Status: DC
  Filled 2021-10-29 – 2021-12-24 (×4): qty 6, 84d supply, fill #0

## 2021-10-29 MED ORDER — TIRZEPATIDE 12.5 MG/0.5ML ~~LOC~~ SOAJ: 12.5000 mg | SUBCUTANEOUS | 3 refills | Status: DC

## 2021-10-31 ENCOUNTER — Other Ambulatory Visit (HOSPITAL_COMMUNITY): Payer: Self-pay

## 2021-10-31 ENCOUNTER — Telehealth: Payer: Self-pay | Admitting: Gastroenterology

## 2021-10-31 NOTE — Telephone Encounter (Signed)
Patty, Not sure why they are giving [name] issues with a PTP, as it was a Radiology finding and recommendation. But let me touchbase with patient's PCP before we do a PTP. Looks like this was for a CT-Chest due to a pulmonary nodule noted on a CT-Abdomen that had been ordered by DJ. I noticed, that Dr. Dennard Schaumann got a CT-Chest 1 month after the previous CT-Abdomen and there was no mention of the nodule.  Dr. Dennard Schaumann, [facility] you are well.  Your patient had a CT-Chest done by you one month after the initial CTAbdomen.  The CT-Chest has no mention of the nodule.  Do you feel this patient even needs a repeat CT-Chest?  If you do, would you want your team to work on this, since this has more impact and follow up needs beyond the GI realm?  If you had spoken with DJ previously, I'm sorry but just want to make sure we try and close this loop and get a PTP if he actually needs it.  Appreciate your help. Thanks. GM

## 2021-10-31 NOTE — Telephone Encounter (Signed)
Case needs a peer to peer to complete authorization status, a peer to peer can be scheduled by calling the number listed below.  Peer to peer: [place] [facility] Case# [id number]

## 2021-11-01 ENCOUNTER — Other Ambulatory Visit: Payer: Self-pay | Admitting: Family Medicine

## 2021-11-01 ENCOUNTER — Encounter: Payer: Self-pay | Admitting: Gastroenterology

## 2021-11-01 ENCOUNTER — Ambulatory Visit (INDEPENDENT_AMBULATORY_CARE_PROVIDER_SITE_OTHER): Payer: 59 | Admitting: Gastroenterology

## 2021-11-01 VITALS — BP 155/90 | HR 99 | Temp 98.8°F | Ht 68.0 in | Wt >= 6400 oz

## 2021-11-01 DIAGNOSIS — D509 Iron deficiency anemia, unspecified: Secondary | ICD-10-CM | POA: Diagnosis not present

## 2021-11-01 DIAGNOSIS — K625 Hemorrhage of anus and rectum: Secondary | ICD-10-CM

## 2021-11-01 DIAGNOSIS — R911 Solitary pulmonary nodule: Secondary | ICD-10-CM

## 2021-11-01 NOTE — Progress Notes (Signed)
Gastroenterology Office Note     Primary Care Physician:  Susy Frizzle, MD  Primary Gastroenterologist: Dr. Abbey Chatters   Chief Complaint   Chief Complaint  Patient presents with   Hemorrhoids    Pt here for a banding     History of Present Illness   Ricardo Lopez. is a 46 y.o. male presenting today in follow-up with a history of anemia, rectal bleeding, GERD, gastritis, multiple hemorrhoid bandings a few months ago, presenting for attempted hemorrhoid banding.  Procedure note below. Unfortunately, I was able to deploy the band on sufficient tissue X 2. Unable to perform anoscopy due to body habitus. Difficulty in maneuvering the ligator and have been banding neutrally with good results until this visit.   He notes intermittent low-volume bleeding. Chronic microcytic anemia with IDA component historically. Hgb 9.4 recently. Iron low normal at 59. Ferritin 74.   He has had both colonoscopy and EGD. He desires further evaluation of anemia. No prior capsule.        Past Medical History:  Diagnosis Date   Anemia    Anxiety and depression    B12 deficiency    Back pain    COVID    9/21- hypoxia requiring oxygen   Diabetes mellitus without complication (HCC)    Frequency of urination    GERD (gastroesophageal reflux disease)    Gluteal abscess    ecoli after covid hospitalization 2021   Headache    Hypertension    Obesity    Sleep apnea    Stomach ulcer    Tachycardia    Thoracic aortic aneurysm [facility])     Past Surgical History:  Procedure Laterality Date   BIOPSY  05/17/2021   Procedure: BIOPSY;  Surgeon: Milus Banister, MD;  Location: [facility];  Service: Gastroenterology;;  EGD and COLON   COLONOSCOPY WITH PROPOFOL N/A 05/17/2021   Surgeon: Milus Banister, MD; mild erythema and granularity of the colonic mucosa with benign biopsies, internal hemorrhoids suspected to be etiology of rectal bleeding.   ESOPHAGOGASTRODUODENOSCOPY (EGD) WITH  PROPOFOL N/A 05/17/2021   Surgeon: Milus Banister, MD; very mild, nonspecific distal gastritis s/p biopsied, otherwise normal exam.  Gastric biopsy was benign, negative for H. pylori.    Current Outpatient Medications  Medication Sig Dispense Refill   acetaminophen (TYLENOL) 325 MG tablet Take 650 mg by mouth every 6 (six) hours as needed for moderate pain.     albuterol (VENTOLIN HFA) 108 (90 Base) MCG/ACT inhaler Inhale 2 puffs into the lungs every 4 (four) hours as needed for wheezing or shortness of breath. 18 g 2   ascorbic acid (VITAMIN C) 500 MG tablet Take 1 tablet (500 mg total) by mouth daily. 30 tablet 1   atorvastatin (LIPITOR) 10 MG tablet Take 1 tablet (10 mg total) by mouth daily. 90 tablet 3   Blood Glucose Monitoring Suppl (ONE TOUCH ULTRA 2) w/Device KIT 1 each by Does not apply route 3 (three) times daily. 1 kit 1   buprenorphine (BUTRANS) 20 MCG/HR PTWK Place 1 patch onto the skin once a week. 4 patch 0   buPROPion (WELLBUTRIN XL) 150 MG 24 hr tablet TAKE 1 TABLET(150 MG) BY MOUTH DAILY 30 tablet 5   cetirizine (ZYRTEC) 10 MG tablet TAKE ONE TABLET BY MOUTH DAILY 90 tablet 3   Cholecalciferol (VITAMIN D) 50 MCG (2000 UT) tablet Take 2,000 Units by mouth daily.     ciclopirox (PENLAC) 8 % solution  Apply topically at bedtime. Apply over nail and surrounding skin. Apply daily over previous coat. After seven (7) days, may remove with alcohol and continue cycle. 6.6 mL 5   clotrimazole-betamethasone (LOTRISONE) cream APPLY TOPICALLY TO THE AFFECTED AREA TWICE DAILY 45 g 1   FEROSUL 325 (65 Fe) MG tablet TAKE 1 TABLET BY MOUTH EVERY MORNING 30 tablet 3   fluticasone (FLONASE) 50 MCG/ACT nasal spray Place 2 sprays into both nostrils daily as needed for allergies. 16 g 3   glucose blood (ONETOUCH ULTRA) test strip 1 each by Other route 3 (three) times daily. Use to check blood sugar three times daily as instructed 100 each 11   Lancets MISC Check fbs daily 100 each 11   metoprolol  succinate (TOPROL-XL) 100 MG 24 hr tablet TAKE 1 TABLET(100 MG) BY MOUTH DAILY 90 tablet 0   Multiple Vitamin (MULTIVITAMIN) tablet Take 1 tablet by mouth daily.     Oxycodone HCl 10 MG TABS Take 1-2 tablets (10-20 mg total) by mouth 3 (three) times daily as needed. 180 tablet 0   oxyCODONE-acetaminophen (PERCOCET) 10-325 MG tablet Take 1-2 tablets by mouth 3 (three) times daily as needed. 180 tablet 0   pantoprazole (PROTONIX) 40 MG tablet TAKE 1 TABLET(40 MG) BY MOUTH TWICE DAILY BEFORE A MEAL 180 tablet 3   potassium chloride SA (KLOR-CON M) 20 MEQ tablet TAKE 2 TABLETS BY MOUTH DAILY 180 tablet 0   prazosin (MINIPRESS) 2 MG capsule TAKE 1 CAPSULE(2 MG) BY MOUTH AT BEDTIME 90 capsule 0   sodium chloride (OCEAN) 0.65 % SOLN nasal spray Place 1 spray into both nostrils as needed for congestion. 88 mL 0   tirzepatide (MOUNJARO) 12.5 MG/0.5ML Pen Inject 12.5 mg into the skin once a week. 6 mL 3   venlafaxine XR (EFFEXOR XR) 75 MG 24 hr capsule Take 2 capsules (150 mg total) by mouth daily with breakfast. 60 capsule 5   zolpidem (AMBIEN) 10 MG tablet TAKE 1 TABLET(10 MG) BY MOUTH AT BEDTIME AS NEEDED FOR SLEEP 30 tablet 3   furosemide (LASIX) 80 MG tablet Take 1 tablet (80 mg total) by mouth 2 (two) times daily. 631 tablet 3   TRULICITY 3 SH/7.46YO SOPN ADMINISTER 3 MG UNDER THE SKIN 1 TIME A WEEK AS DIRECTED (Patient not taking: Reported on 11/01/2021) 2 mL 1   No current facility-administered medications for this visit.    Allergies as of 11/01/2021   (No Known Allergies)    Family History  Problem Relation Age of Onset   Diabetes Mother    Hypertension Mother    Hyperlipidemia Mother    Thyroid disease Mother    Diabetes Father    Hyperlipidemia Father    Hypertension Father    Depression Father    Anxiety disorder Father    Sleep apnea Father    Colon cancer Neg Hx    Stomach cancer Neg Hx     Social History   Socioeconomic History   Marital status: Married    Spouse name:  Not on file   Number of children: Not on file   Years of education: Not on file   Highest education level: Not on file  Occupational History   Occupation: stay at home spouse  Tobacco Use   Smoking status: Never   Smokeless tobacco: Never  Vaping Use   Vaping Use: Never used  Substance and Sexual Activity   Alcohol use: Not Currently    Comment: occasional   Drug use: Not  Currently    Types: Marijuana    Comment: occasionally   Sexual activity: Yes  Other Topics Concern   Not on file  Social History Narrative   Lives at home with wife and two children.  Four children in all.     Social Determinants of Health   Financial Resource Strain: Not on file  Food Insecurity: Not on file  Transportation Needs: Not on file  Physical Activity: Not on file  Stress: Not on file  Social Connections: Not on file  Intimate Partner Violence: Not on file     Review of Systems   Gen: Denies any fever, chills, fatigue, weight loss, lack of appetite.  CV: Denies chest pain, heart palpitations, peripheral edema, syncope.  Resp: +DOE GI: Denies dysphagia or odynophagia. Denies jaundice, hematemesis, fecal incontinence. GU : Denies urinary burning, urinary frequency, urinary hesitancy MS: limited mobility  Derm: Denies rash, itching, dry skin Psych: Denies depression, anxiety, memory loss, and confusion Heme: see HPI   Physical Exam   BP (!) 155/90   Pulse 99   Temp 98.8 F (37.1 C)   Ht _0  (1.727 m)   Wt (!) 525 lb (238.1 kg)   BMI 79.83 kg/m  General:   Alert and oriented. Pleasant and cooperative. Well-nourished and well-developed.  Head:  Normocephalic and atraumatic. Eyes:  Without icterus Rectal:  no external hemorrhoids. See banding procedure below.  Msk:  Symmetrical without gross deformities. Normal posture. Extremities:  Without edema. Neurologic:  Alert and  oriented x4;  grossly normal neurologically. Skin:  Intact without significant lesions or rashes. Psych:   Alert and cooperative. Normal mood and affect.    [place] BANDING PROCEDURE NOTE  Worthy Boschert. is a 46 y.o. male presenting today for consideration of hemorrhoid banding.    The patient presents with symptomatic grade 2 hemorrhoids, unresponsive to maximal medical therapy, requesting rubber band ligation of his hemorrhoidal disease. All risks, benefits, and alternative forms of therapy were described and informed consent was obtained.  The decision was made neutrally, and the [place] was used to attempt band ligation X 2 without success. Insufficient tissue present.  No complications were encountered and the patient tolerated the procedure well.   Assessment   Fairley Copher. is a 46 y.o. male presenting today in follow-up with a history of anemia, rectal bleeding, GERD, gastritis, multiple hemorrhoid bandings a few months ago, presenting for attempted hemorrhoid banding.  Hemorrhoid banding unsuccessful today. I feel we have maximized CRH banding technique at this point. His body habitus makes it difficult to angle the ligator, but he did well with neutral bandings on several prior occasions.   He does have a history of chronic microcytic anemia with IDA component. Remains on oral iron. He is desirous of completing anemia work-up. Colonoscopy/EGD on file as above. I have discussed with Jeralyn Bennett, RN, in Endo. Due to body habitus, the belt for the capsule device will need to be extended; however, she believes this can be accomplished.    PLAN    Capsule study Hold iron one week prior Further recommendations to follow.    Annitta Needs, PhD, ANP-BC [facility] Gastroenterology

## 2021-11-01 NOTE — Patient Instructions (Signed)
I am going to look into arranging a capsule study for you!  Further recommendations to follow!  I enjoyed seeing you again today! As you know, I value our relationship and want to provide genuine, compassionate, and quality care. I welcome your feedback. If you receive a survey regarding your visit,  I greatly appreciate you taking time to fill this out. See you next time!  Annitta Needs, PhD, ANP-BC [facility] Gastroenterology

## 2021-11-05 ENCOUNTER — Telehealth: Payer: 59 | Admitting: Gastroenterology

## 2021-11-05 NOTE — Telephone Encounter (Signed)
Ricardo Lopez/Ricardo Lopez:  Please arrange capsule study due to IDA. I have spoken with [facility] regarding patient and BMI. Please give her a heads up when he is scheduled so she may ensure the device is secured appropriately.

## 2021-11-05 NOTE — Telephone Encounter (Signed)
Notification or Prior Authorization is not required for the requested services  Decision ID #:[id number]  -----  Spoke with pt and will discuss what date he can do.

## 2021-11-06 ENCOUNTER — Ambulatory Visit (HOSPITAL_COMMUNITY)
Admission: RE | Admit: 2021-11-06 | Discharge: 2021-11-06 | Disposition: A | Discharge status: Home / Self Care | Payer: 59 | Source: Ambulatory Visit | Attending: Gastroenterology | Admitting: Gastroenterology

## 2021-11-06 DIAGNOSIS — R911 Solitary pulmonary nodule: Secondary | ICD-10-CM | POA: Diagnosis present

## 2021-11-15 ENCOUNTER — Telehealth: Payer: Self-pay | Admitting: *Deleted

## 2021-11-15 ENCOUNTER — Encounter: Payer: Self-pay | Admitting: *Deleted

## 2021-11-15 NOTE — Telephone Encounter (Signed)
Pt wife called in. Had mix up in dates for givens. He was added to tomorrow at 7:30am

## 2021-11-16 ENCOUNTER — Ambulatory Visit (HOSPITAL_COMMUNITY)
Admission: RE | Admit: 2021-11-16 | Discharge: 2021-11-16 | Disposition: A | Discharge status: Home / Self Care | Payer: 59 | Attending: Internal Medicine | Admitting: Internal Medicine

## 2021-11-16 ENCOUNTER — Encounter (HOSPITAL_COMMUNITY)
Admission: RE | Disposition: A | Discharge status: Home / Self Care | Payer: Self-pay | Source: Home / Self Care | Attending: Internal Medicine

## 2021-11-16 DIAGNOSIS — D509 Iron deficiency anemia, unspecified: Secondary | ICD-10-CM | POA: Insufficient documentation

## 2021-11-16 DIAGNOSIS — D649 Anemia, unspecified: Secondary | ICD-10-CM | POA: Diagnosis not present

## 2021-11-16 HISTORY — PX: GIVENS CAPSULE STUDY: SHX5432

## 2021-11-16 SURGERY — IMAGING PROCEDURE, GI TRACT, INTRALUMINAL, VIA CAPSULE

## 2021-11-20 ENCOUNTER — Encounter (HOSPITAL_COMMUNITY): Payer: Self-pay | Admitting: Internal Medicine

## 2021-11-20 NOTE — H&P (Signed)
Patient presenting for GIVENS capsule for history of iron deficiency anemia.

## 2021-11-22 ENCOUNTER — Telehealth: Payer: Self-pay | Admitting: Gastroenterology

## 2021-11-22 DIAGNOSIS — D509 Iron deficiency anemia, unspecified: Secondary | ICD-10-CM

## 2021-11-22 NOTE — Telephone Encounter (Signed)
Capsule study images reviewed.  I am unable to visualize any images well. I was able to capture the first gastric image, but then there was a lot of food debris. I was unable to see it pass into the small bowel. Unable to identify landmarks due to debris.  Was patient NPO after midnight? Did he eat during the test?

## 2021-11-23 NOTE — Telephone Encounter (Signed)
Ricardo Lopez I phoned the pt and advised of his result note. The pt stated that he didn't eat anything after 8pm the night before or during the test. He stated he followed the instructions on his paperwork. Please advise

## 2021-11-23 NOTE — Telephone Encounter (Signed)
Phoned and LMOVM for the pt to return call 

## 2021-11-26 ENCOUNTER — Other Ambulatory Visit (HOSPITAL_COMMUNITY): Payer: Self-pay

## 2021-11-26 MED ORDER — OXYCODONE HCL 10 MG PO TABS
10.0000 mg | ORAL_TABLET | Freq: Three times a day (TID) | ORAL | 0 refills | Status: DC | PRN
  Filled 2021-11-26 – 2022-01-25 (×2): qty 180, 30d supply, fill #0

## 2021-11-26 MED ORDER — BUPRENORPHINE 20 MCG/HR TD PTWK
1.0000 | MEDICATED_PATCH | TRANSDERMAL | 0 refills | Status: DC
  Filled 2021-11-26 – 2021-12-17 (×4): qty 4, 28d supply, fill #0

## 2021-11-27 NOTE — Telephone Encounter (Signed)
Has he seen the capsule pass in his stool? If not, we need to do a simple xray to make sure it has passed.  At this point, we could consider a CT enterography to look for any small bowel issues, which would complete IDA evaluation, we could redo capsule with a modified prep, or we can watch conservatively.

## 2021-11-28 ENCOUNTER — Other Ambulatory Visit (HOSPITAL_COMMUNITY): Payer: Self-pay

## 2021-11-28 NOTE — Telephone Encounter (Signed)
Phoned and spoke to the pt and he has not seen the capsule. The pt doesn't know what to do at this point but he states he will do whatever you think is best to find out what's going on. Then he stated the CT but it is what you recommend. Please advise.

## 2021-11-29 NOTE — Procedures (Signed)
Small Bowel [place] Capsule Study Procedure date:  11/16/21  Referring Provider:  Lewie Loron, NP PCP:  Dr. [name], MD  Indication for procedure:   History of chronic microcytic anemia with IDA component. Colonoscopy and EGD on file. Recent bandings on file.     Findings:  Poor prep. Able to identify first gastric image but marked debris precluded any other appropriate visualization. I was not able to identify actual small bowel or additional landmarks.   First Gastric image:  00:00:37 First Duodenal image: N/A First Ileo-Cecal Valve image: N/A First Cecal image: N/A Gastric Passage time: N/A Small Bowel Passage time:  N/A  Summary & Recommendations: Chronic microcytic anemia with IDA component, s/p colonoscopy/EGD, and now with capsule attempted. However, inadequate images noted. He reports following the prep. I query if it stayed prolonged in the stomach in setting of possible delayed gastric emptying, delayed mobility, etc.   Will check abdominal xray to ensure passage. CTE if patient willing. Continue Hematology follow-up.   [name], PhD, ANP-BC [facility] Gastroenterology

## 2021-11-29 NOTE — Telephone Encounter (Signed)
Pt doesn't need appt for the abd xray. He just goes to radiology. Tobi Bastos, do you want [name] to go ahead and arrange the CTE or wait for the results of the xray

## 2021-11-29 NOTE — Telephone Encounter (Signed)
Noted.   First, we need an abdominal xray to ensure it has passed. Tammy/Mindy: please arrange abdominal xray to assess capsule passage.   We can arrange a CT enterography due to IDA.

## 2021-11-29 NOTE — Addendum Note (Signed)
Addended by: Armstead Peaks on: 11/29/2021 04:20 PM   Modules accepted: Orders

## 2021-11-29 NOTE — Telephone Encounter (Signed)
Spoke with the pt, advised of the steps to take next. He is fine with doing both. Awaiting your call

## 2021-12-03 NOTE — Telephone Encounter (Signed)
noted 

## 2021-12-05 ENCOUNTER — Other Ambulatory Visit: Payer: Self-pay | Admitting: Family Medicine

## 2021-12-05 ENCOUNTER — Other Ambulatory Visit: Payer: Self-pay | Admitting: Cardiology

## 2021-12-07 ENCOUNTER — Other Ambulatory Visit (HOSPITAL_COMMUNITY): Payer: Self-pay

## 2021-12-10 ENCOUNTER — Ambulatory Visit (HOSPITAL_COMMUNITY)
Admission: RE | Admit: 2021-12-10 | Discharge: 2021-12-10 | Disposition: A | Discharge status: Home / Self Care | Payer: 59 | Source: Ambulatory Visit | Attending: Gastroenterology | Admitting: Gastroenterology

## 2021-12-10 ENCOUNTER — Other Ambulatory Visit: Payer: Self-pay | Admitting: Gastroenterology

## 2021-12-10 DIAGNOSIS — D509 Iron deficiency anemia, unspecified: Secondary | ICD-10-CM

## 2021-12-11 ENCOUNTER — Other Ambulatory Visit: Payer: Self-pay | Admitting: *Deleted

## 2021-12-11 ENCOUNTER — Encounter: Payer: Self-pay | Admitting: *Deleted

## 2021-12-11 DIAGNOSIS — D509 Iron deficiency anemia, unspecified: Secondary | ICD-10-CM

## 2021-12-17 ENCOUNTER — Other Ambulatory Visit (HOSPITAL_COMMUNITY): Payer: Self-pay

## 2021-12-17 ENCOUNTER — Other Ambulatory Visit: Payer: Self-pay

## 2021-12-19 ENCOUNTER — Other Ambulatory Visit (HOSPITAL_COMMUNITY): Payer: Self-pay

## 2021-12-24 ENCOUNTER — Encounter: Payer: Self-pay | Admitting: Family Medicine

## 2021-12-24 ENCOUNTER — Other Ambulatory Visit (HOSPITAL_COMMUNITY): Payer: Self-pay

## 2021-12-25 ENCOUNTER — Other Ambulatory Visit: Payer: Self-pay | Admitting: Family Medicine

## 2021-12-25 ENCOUNTER — Other Ambulatory Visit (HOSPITAL_COMMUNITY): Payer: Self-pay

## 2021-12-25 MED ORDER — OXYCODONE HCL 10 MG PO TABS
10.0000 mg | ORAL_TABLET | Freq: Three times a day (TID) | ORAL | 0 refills | Status: DC | PRN
  Filled 2021-12-25 – 2021-12-26 (×2): qty 180, 30d supply, fill #0

## 2021-12-25 MED ORDER — BUPRENORPHINE 20 MCG/HR TD PTWK
1.0000 | MEDICATED_PATCH | TRANSDERMAL | 0 refills | Status: DC
  Filled 2021-12-25: qty 4, 28d supply, fill #0

## 2021-12-25 MED ORDER — TIRZEPATIDE 15 MG/0.5ML ~~LOC~~ SOAJ: 15.0000 mg | SUBCUTANEOUS | 3 refills | Status: DC

## 2021-12-26 ENCOUNTER — Other Ambulatory Visit (HOSPITAL_COMMUNITY): Payer: Self-pay

## 2021-12-26 NOTE — Telephone Encounter (Signed)
Requested medication (s) are due for refill today: yes  Requested medication (s) are on the active medication list: yes  Last refill:  08/23/21 #30 3 RF  Future visit scheduled: no  Notes to clinic:  unsure when Dr. Tanya Nones wants to see pt for the med- med not delegated to NT to refill    Requested Prescriptions  Pending Prescriptions Disp Refills   zolpidem (AMBIEN) 10 MG tablet [Pharmacy Med Name: ZOLPIDEM 10MG  TABLETS] 30 tablet     Sig: TAKE 1 TABLET(10 MG) BY MOUTH AT BEDTIME AS NEEDED FOR SLEEP     Not Delegated - Psychiatry:  Anxiolytics/Hypnotics Failed - 12/25/2021  8:43 PM      Failed - This refill cannot be delegated      Failed - Urine Drug Screen completed in last 360 days      Failed - Valid encounter within last 6 months    Recent Outpatient Visits           9 months ago Type 2 diabetes mellitus with hyperglycemia, without long-term current use of insulin (HCC)   [facility], MD   1 year ago Acute midline low back pain, unspecified whether sciatica present   [facility]   1 year ago Depression, major, single episode, moderate (HCC)   [facility] [place], [facility], MD   1 year ago SOB (shortness of breath) on exertion   [facility] Pickard, [facility], MD   1 year ago Upper respiratory tract infection, unspecified type   [facility], NP

## 2022-01-18 ENCOUNTER — Ambulatory Visit (HOSPITAL_COMMUNITY)
Admission: RE | Admit: 2022-01-18 | Discharge: 2022-01-18 | Disposition: A | Discharge status: Home / Self Care | Payer: 59 | Source: Ambulatory Visit | Attending: Gastroenterology | Admitting: Gastroenterology

## 2022-01-18 DIAGNOSIS — D509 Iron deficiency anemia, unspecified: Secondary | ICD-10-CM | POA: Insufficient documentation

## 2022-01-18 LAB — POCT I-STAT CREATININE: Creatinine, Ser: 1.3 mg/dL — ABNORMAL HIGH (ref 0.61–1.24)

## 2022-01-18 MED ORDER — IOHEXOL 300 MG/ML  SOLN
100.0000 mL | Freq: Once | INTRAMUSCULAR | Status: AC | PRN
  Administered 2022-01-18: 100 mL via INTRAVENOUS

## 2022-01-22 ENCOUNTER — Other Ambulatory Visit: Payer: Self-pay | Admitting: Family Medicine

## 2022-01-22 DIAGNOSIS — R0982 Postnasal drip: Secondary | ICD-10-CM

## 2022-01-23 ENCOUNTER — Other Ambulatory Visit: Payer: Self-pay | Admitting: Cardiology

## 2022-01-24 ENCOUNTER — Other Ambulatory Visit (HOSPITAL_COMMUNITY): Payer: Self-pay

## 2022-01-24 MED ORDER — OFLOXACIN 0.3 % OP SOLN
1.0000 [drp] | Freq: Four times a day (QID) | OPHTHALMIC | 0 refills | Status: AC
  Filled 2022-01-24: qty 5, 10d supply, fill #0

## 2022-01-25 ENCOUNTER — Other Ambulatory Visit (HOSPITAL_COMMUNITY): Payer: Self-pay

## 2022-01-25 MED ORDER — BUPRENORPHINE 20 MCG/HR TD PTWK
1.0000 | MEDICATED_PATCH | TRANSDERMAL | 0 refills | Status: DC
  Filled 2022-01-25: qty 4, 28d supply, fill #0

## 2022-01-25 MED ORDER — OXYCODONE HCL 10 MG PO TABS
10.0000 mg | ORAL_TABLET | Freq: Three times a day (TID) | ORAL | 0 refills | Status: DC | PRN
  Filled 2022-06-28: qty 180, 30d supply, fill #0

## 2022-01-28 ENCOUNTER — Other Ambulatory Visit (HOSPITAL_COMMUNITY): Payer: Self-pay

## 2022-02-11 ENCOUNTER — Other Ambulatory Visit (HOSPITAL_COMMUNITY): Payer: Self-pay

## 2022-02-22 ENCOUNTER — Other Ambulatory Visit: Payer: Self-pay | Admitting: Family Medicine

## 2022-02-22 ENCOUNTER — Other Ambulatory Visit (HOSPITAL_COMMUNITY): Payer: Self-pay

## 2022-02-22 MED ORDER — OXYCODONE HCL 10 MG PO TABS
10.0000 mg | ORAL_TABLET | Freq: Three times a day (TID) | ORAL | 0 refills | Status: DC | PRN
  Filled 2022-02-25: qty 180, 30d supply, fill #0

## 2022-02-22 NOTE — Telephone Encounter (Signed)
Requested Prescriptions  Pending Prescriptions Disp Refills   FEROSUL 325 (65 Fe) MG tablet [Pharmacy Med Name: FERROUS SULFATE 325MG  (5GR) TABS] 90 tablet 1    Sig: TAKE 1 TABLET BY MOUTH EVERY MORNING     Endocrinology:  Minerals - Iron Supplementation Failed - 02/22/2022  6:24 AM      Failed - HGB in normal range and within 360 days    Hemoglobin  Date Value Ref Range Status  10/18/2021 9.4 (L) 13.2 - 17.1 g/dL Final  05/18/2020 10.2 (L) 13.0 - 17.7 g/dL Final         Failed - HCT in normal range and within 360 days    HCT  Date Value Ref Range Status  10/18/2021 32.6 (L) 38.5 - 50.0 % Final   Hematocrit  Date Value Ref Range Status  05/18/2020 34.9 (L) 37.5 - 51.0 % Final         Passed - RBC in normal range and within 360 days    RBC  Date Value Ref Range Status  10/18/2021 4.64 4.20 - 5.80 Million/uL Final         Passed - Fe (serum) in normal range and within 360 days    Iron  Date Value Ref Range Status  10/23/2021 59 50 - 180 mcg/dL Final  05/18/2020 58 38 - 169 ug/dL Final   %SAT  Date Value Ref Range Status  10/23/2021 21 20 - 48 % (calc) Final         Passed - Ferritin in normal range and within 360 days    Ferritin  Date Value Ref Range Status  10/23/2021 74 38 - 380 ng/mL Final  05/18/2020 125 30 - 400 ng/mL Final         Passed - Valid encounter within last 12 months    Recent Outpatient Visits           11 months ago Type 2 diabetes mellitus with hyperglycemia, without long-term current use of insulin ([place])   [place] Susy Frizzle, MD   1 year ago Acute midline low back pain, unspecified whether sciatica present   [place] Susy Frizzle, MD   1 year ago Depression, major, single episode, moderate ([place])   [place] Pickard, Cammie Mcgee, MD   1 year ago SOB (shortness of breath) on exertion   [place] Pickard, Cammie Mcgee, MD   1 year ago Upper respiratory tract  infection, unspecified type   [place] Eulogio Bear, NP

## 2022-02-25 ENCOUNTER — Other Ambulatory Visit (HOSPITAL_COMMUNITY): Payer: Self-pay

## 2022-02-26 ENCOUNTER — Other Ambulatory Visit (HOSPITAL_COMMUNITY): Payer: Self-pay

## 2022-03-04 NOTE — Telephone Encounter (Signed)
Manuela Schwartz: please arrange office visit in next 1-2 weeks with any available APP. Thanks!

## 2022-03-08 ENCOUNTER — Other Ambulatory Visit: Payer: Self-pay | Admitting: Cardiology

## 2022-03-08 ENCOUNTER — Other Ambulatory Visit: Payer: Self-pay | Admitting: Family Medicine

## 2022-03-11 ENCOUNTER — Other Ambulatory Visit: Payer: Self-pay | Admitting: Cardiology

## 2022-03-11 NOTE — Telephone Encounter (Signed)
Requested Prescriptions  Pending Prescriptions Disp Refills   prazosin (MINIPRESS) 2 MG capsule [Pharmacy Med Name: PRAZOSIN 2MG CAPSULES] 90 capsule 0    Sig: TAKE 1 CAPSULE(2 MG) BY MOUTH AT BEDTIME     Cardiovascular:  Alpha Blockers Failed - 03/08/2022  9:18 PM      Failed - Last BP in normal range    BP Readings from Last 1 Encounters:  11/01/21 (!) 155/90         Failed - Valid encounter within last 6 months    Recent Outpatient Visits           11 months ago Type 2 diabetes mellitus with hyperglycemia, without long-term current use of insulin ([place])   [place] Pickard, Cammie Mcgee, MD   1 year ago Acute midline low back pain, unspecified whether sciatica present   [place] Susy Frizzle, MD   1 year ago Depression, major, single episode, moderate ([place])   [place] Pickard, Cammie Mcgee, MD   1 year ago SOB (shortness of breath) on exertion   [place] Pickard, Cammie Mcgee, MD   1 year ago Upper respiratory tract infection, unspecified type   [place] Eulogio Bear, NP               Patient will need an office visit for further refills.

## 2022-03-19 NOTE — Progress Notes (Unsigned)
Referring Provider: Susy Frizzle, MD Primary Care Physician:  Susy Frizzle, MD Primary GI Physician: Dr. Abbey Chatters  No chief complaint on file.   HPI:   Ricardo Klett. is a 47 y.o. male with history of chronic microcytic anemia at least since 2019, rectal bleeding, hemorrhoids, GERD, chronic upper abdominal pain.   EGD/colonoscopy April 2023 by  Dr. Ardis Hughs as patient was not a candidate for procedures at [facility] due to morbid obesity: EGD: very mild, nonspecific distal gastritis s/p biopsied, otherwise normal exam.  Gastric biopsy was benign, negative for H. pylori.  Colonoscopy: Mild erythema and granularity of the colon mucosa, possibly more significant on the left than the right side. Biopsies taken to check for chronic inflammation versus possible prep effect. Internal hemorrhoids. The examination was otherwise normal on direct and retroflexion views.  Colon biopsies were benign.  Overall, suspected rectal bleeding likely secondary to hemorrhoids.   He has undergone hemorrhoid banding x 3.  Body habitus to complicate hemorrhoid banding resulting in banding only in the neutral position.  Last attempted hemorrhoid banding October 2023, unsuccessful attempt to put a band on sufficient tissue x 2.  Givens capsule was completed 11/16/2021, but prep was poor and only able to identify first gastric image.  Follow-up CT enterography 01/18/2022 with no significant bowel abnormalities.  He had a 1.7 cm right external iliac lymph node of uncertain etiology and significance.  Recommended follow-up CT in 3-6 months.  He is on recall for repeat CT.  Patient sent MyChart message 03/01/2022 reporting rectal bleeding abdominal pain/burning.Recommended OV for further evaluation.   Today:  IDA:  Iron panel wnl October 2023.  Hemoglobin 9.24 September 2021.  Cr wnl September 2023.   Hematology: ??  A1C 6.23 September 2021.   Epigastric burning:    ?biliary etiology vs  gastritis vs uncontrolled GERD, gastroparesis?   Past Medical History:  Diagnosis Date   Anemia    Anxiety and depression    B12 deficiency    Back pain    COVID    9/21- hypoxia requiring oxygen   Diabetes mellitus without complication (HCC)    Frequency of urination    GERD (gastroesophageal reflux disease)    Gluteal abscess    ecoli after covid hospitalization 2021   Headache    Hypertension    Obesity    Sleep apnea    Stomach ulcer    Tachycardia    Thoracic aortic aneurysm [facility])     Past Surgical History:  Procedure Laterality Date   BIOPSY  05/17/2021   Procedure: BIOPSY;  Surgeon: Milus Banister, MD;  Location: [facility];  Service: Gastroenterology;;  EGD and COLON   COLONOSCOPY WITH PROPOFOL N/A 05/17/2021   Surgeon: Milus Banister, MD; mild erythema and granularity of the colonic mucosa with benign biopsies, internal hemorrhoids suspected to be etiology of rectal bleeding.   ESOPHAGOGASTRODUODENOSCOPY (EGD) WITH PROPOFOL N/A 05/17/2021   Surgeon: Milus Banister, MD; very mild, nonspecific distal gastritis s/p biopsied, otherwise normal exam.  Gastric biopsy was benign, negative for H. pylori.   GIVENS CAPSULE STUDY N/A 11/16/2021   Procedure: GIVENS CAPSULE STUDY;  Surgeon: Eloise Harman, DO;  Location: [facility];  Service: Endoscopy;  Laterality: N/A;  7:30am    Current Outpatient Medications  Medication Sig Dispense Refill   acetaminophen (TYLENOL) 325 MG tablet Take 650 mg by mouth every 6 (six) hours as needed for moderate pain.     albuterol (  VENTOLIN HFA) 108 (90 Base) MCG/ACT inhaler Inhale 2 puffs into the lungs every 4 (four) hours as needed for wheezing or shortness of breath. 18 g 2   ascorbic acid (VITAMIN C) 500 MG tablet Take 1 tablet (500 mg total) by mouth daily. 30 tablet 1   atorvastatin (LIPITOR) 10 MG tablet Take 1 tablet (10 mg total) by mouth daily. 90 tablet 3   Blood Glucose Monitoring Suppl (ONE TOUCH ULTRA 2) w/Device KIT  1 each by Does not apply route 3 (three) times daily. 1 kit 1   buprenorphine (BUTRANS) 20 MCG/HR PTWK Place 1 patch onto the skin once a week. 4 patch 0   buprenorphine (BUTRANS) 20 MCG/HR PTWK Place 1 patch onto the skin once a week. 4 patch 0   buPROPion (WELLBUTRIN XL) 150 MG 24 hr tablet TAKE 1 TABLET(150 MG) BY MOUTH DAILY 30 tablet 5   cetirizine (ZYRTEC) 10 MG tablet TAKE 1 TABLET BY MOUTH DAILY 60 tablet 0   Cholecalciferol (VITAMIN D) 50 MCG (2000 UT) tablet Take 2,000 Units by mouth daily.     ciclopirox (PENLAC) 8 % solution Apply topically at bedtime. Apply over nail and surrounding skin. Apply daily over previous coat. After seven (7) days, may remove with alcohol and continue cycle. 6.6 mL 5   clotrimazole-betamethasone (LOTRISONE) cream APPLY TOPICALLY TO THE AFFECTED AREA TWICE DAILY 45 g 1   ferrous sulfate (FEROSUL) 325 (65 FE) MG tablet TAKE 1 TABLET BY MOUTH EVERY MORNING 90 tablet 1   fluticasone (FLONASE) 50 MCG/ACT nasal spray Place 2 sprays into both nostrils daily as needed for allergies. 16 g 3   furosemide (LASIX) 80 MG tablet Take 1 tablet (80 mg total) by mouth 2 (two) times daily. 180 tablet 3   glucose blood (ONETOUCH ULTRA) test strip 1 each by Other route 3 (three) times daily. Use to check blood sugar three times daily as instructed 100 each 11   Lancets MISC Check fbs daily 100 each 11   metoprolol succinate (TOPROL-XL) 100 MG 24 hr tablet TAKE 1 TABLET(100 MG) BY MOUTH DAILY 30 tablet 0   Multiple Vitamin (MULTIVITAMIN) tablet Take 1 tablet by mouth daily.     ofloxacin (OCUFLOX) 0.3 % ophthalmic solution Place 1 drop into eye(s) 4 (four) times daily as needed for eye infection. 5 mL 0   Oxycodone HCl 10 MG TABS Take 1-2 tablets (10-20 mg total) by mouth 3 (three) times daily as needed. 180 tablet 0   Oxycodone HCl 10 MG TABS Take 1-2 tablets (10-20 mg total) by mouth 3 (three) times daily as needed. 180 tablet 0   Oxycodone HCl 10 MG TABS Take 1-2 tablets  (10-20 mg total) by mouth 3 (three) times daily as needed. 180 tablet 0   oxyCODONE-acetaminophen (PERCOCET) 10-325 MG tablet Take 1-2 tablets by mouth 3 (three) times daily as needed. 180 tablet 0   pantoprazole (PROTONIX) 40 MG tablet TAKE 1 TABLET(40 MG) BY MOUTH TWICE DAILY BEFORE A MEAL 180 tablet 3   potassium chloride SA (KLOR-CON M) 20 MEQ tablet TAKE 2 TABLETS BY MOUTH DAILY 30 tablet 0   prazosin (MINIPRESS) 2 MG capsule TAKE 1 CAPSULE(2 MG) BY MOUTH AT BEDTIME 90 capsule 0   sodium chloride (OCEAN) 0.65 % SOLN nasal spray Place 1 spray into both nostrils as needed for congestion. 88 mL 0   tirzepatide (MOUNJARO) 15 MG/0.5ML Pen Inject 15 mg into the skin once a week. 6 mL 3   venlafaxine XR (  EFFEXOR XR) 75 MG 24 hr capsule Take 2 capsules (150 mg total) by mouth daily with breakfast. 60 capsule 5   zolpidem (AMBIEN) 10 MG tablet TAKE 1 TABLET(10 MG) BY MOUTH AT BEDTIME AS NEEDED FOR SLEEP 30 tablet 2   No current facility-administered medications for this visit.    Allergies as of 03/21/2022   (No Known Allergies)    Family History  Problem Relation Age of Onset   Diabetes Mother    Hypertension Mother    Hyperlipidemia Mother    Thyroid disease Mother    Diabetes Father    Hyperlipidemia Father    Hypertension Father    Depression Father    Anxiety disorder Father    Sleep apnea Father    Colon cancer Neg Hx    Stomach cancer Neg Hx     Social History   Socioeconomic History   Marital status: Married    Spouse name: Not on file   Number of children: Not on file   Years of education: Not on file   Highest education level: Not on file  Occupational History   Occupation: stay at home spouse  Tobacco Use   Smoking status: Never   Smokeless tobacco: Never  Vaping Use   Vaping Use: Never used  Substance and Sexual Activity   Alcohol use: Not Currently    Comment: occasional   Drug use: Not Currently    Types: Marijuana    Comment: occasionally   Sexual  activity: Yes  Other Topics Concern   Not on file  Social History Narrative   Lives at home with wife and two children.  Four children in all.     Social Determinants of Health   Financial Resource Strain: Not on file  Food Insecurity: Not on file  Transportation Needs: Not on file  Physical Activity: Not on file  Stress: Not on file  Social Connections: Not on file    Review of Systems: Gen: Denies fever, chills, anorexia. Denies fatigue, weakness, weight loss.  CV: Denies chest pain, palpitations, syncope, peripheral edema, and claudication. Resp: Denies dyspnea at rest, cough, wheezing, coughing up blood, and pleurisy. GI: Denies vomiting blood, jaundice, and fecal incontinence.   Denies dysphagia or odynophagia. Derm: Denies rash, itching, dry skin Psych: Denies depression, anxiety, memory loss, confusion. No homicidal or suicidal ideation.  Heme: Denies bruising, bleeding, and enlarged lymph nodes.  Physical Exam: There were no vitals taken for this visit. General:   Alert and oriented. No distress noted. Pleasant and cooperative.  Head:  Normocephalic and atraumatic. Eyes:  Conjuctiva clear without scleral icterus. Heart:  S1, S2 present without murmurs appreciated. Lungs:  Clear to auscultation bilaterally. No wheezes, rales, or rhonchi. No distress.  Abdomen:  +BS, soft, non-tender and non-distended. No rebound or guarding. No HSM or masses noted. Msk:  Symmetrical without gross deformities. Normal posture. Extremities:  Without edema. Neurologic:  Alert and  oriented x4 Psych:  Normal mood and affect.    Assessment:     Plan:  ***   Aliene Altes, PA-C [facility] Gastroenter[facility] 03/21/2022

## 2022-03-19 NOTE — H&P (View-Only) (Signed)
Referring Provider: Susy Frizzle, MD Primary Care Physician:  Susy Frizzle, MD Primary GI Physician: Dr. Abbey Chatters  Chief Complaint  Patient presents with   Abdominal Pain    Still having abdominal pain and blood in stool    HPI:   Ricardo Lopez. is a 47 y.o. male with history of chronic microcytic anemia at least since 2019, rectal bleeding, hemorrhoids, GERD, chronic upper abdominal pain.   EGD/colonoscopy April 2023 by  Dr. Ardis Hughs as patient was not a candidate for procedures at [facility] due to morbid obesity: EGD: very mild, nonspecific distal gastritis s/p biopsied, otherwise normal exam.  Gastric biopsy was benign, negative for H. pylori.  Colonoscopy: Mild erythema and granularity of the colon mucosa, possibly more significant on the left than the right side. Biopsies taken to check for chronic inflammation versus possible prep effect. Internal hemorrhoids. The examination was otherwise normal on direct and retroflexion views.  Colon biopsies were benign.  Overall, suspected rectal bleeding likely secondary to hemorrhoids.   He has undergone hemorrhoid banding x 3.  Body habitus complicated hemorrhoid banding resulting in banding only in the neutral position.  Last attempted hemorrhoid banding October 2023, unsuccessful attempt to put a band on sufficient tissue x 2. No real rectal pain.   Givens capsule was completed 11/16/2021, but prep was poor and only able to identify first gastric image.  Follow-up CT enterography 01/18/2022 with no significant bowel abnormalities.  He had a 1.7 cm right external iliac lymph node of uncertain etiology and significance.  Recommended follow-up CT in 3-6 months.  He is on recall for repeat CT.  Patient sent MyChart message 03/01/2022 reporting rectal bleeding abdominal pain/burning.Recommended OV for further evaluation.   Today:  IDA:  Iron panel wnl October 2023.  Hemoglobin 9.24 September 2021.  Cr wnl September 2023.    Continues with rectal bleeding every other day. Some are worse than others. Can be a lot and with clots. Red blood. Has also had a couple of black tarry stools. Last occurred last week. Takes iron daily. No pepto bismol. Bowels move well. Stools are soft. No constipation or diarrhea. No straining. No rectal pain. Bleeding always comes after having upper abdominal pain. Upper abdominal pain is described as burning. Can worsen after meals, but also present before meals. No specific food trigger.  Has been present since COVID-19 in 2021.  Recently started on Mounjaro, previously on Ozempic, but abdominal pain started before Ozempic.  States rectal bleeding did not improve with prior hemorrhoid banding. Doesn't think bleeding is from hemorrhoids. Feels there is something else going on in his GI tract. Doesn't want surgery or to have another hemorrhoid banding if he can help it.   Has reflux a few days a week. Occasional nausea. Taking Pantoprazole 40 mg BID which has helped. Pantoprazole has also helped some with his upper abdominal pain, but it still continues.  No NSAIDs.   Has never seen hematology.   A1C 6.23 September 2021.   Down about 100 lbs since starting Mounjaro.  Wt Readings from Last 3 Encounters:  03/21/22 (!) 436 lb (197.8 kg)  11/01/21 (!) 525 lb (238.1 kg)  10/18/21 (!) 530 lb (240.4 kg)    Past Medical History:  Diagnosis Date   Anemia    Anxiety and depression    B12 deficiency    Back pain    COVID    9/21- hypoxia requiring oxygen   Diabetes mellitus without complication ([place])  Frequency of urination    GERD (gastroesophageal reflux disease)    Gluteal abscess    ecoli after covid hospitalization 2021   Headache    Hypertension    Obesity    Sleep apnea    Stomach ulcer    Tachycardia    Thoracic aortic aneurysm [facility])     Past Surgical History:  Procedure Laterality Date   BIOPSY  05/17/2021   Procedure: BIOPSY;  Surgeon: Milus Banister, MD;   Location: [facility];  Service: Gastroenterology;;  EGD and COLON   COLONOSCOPY WITH PROPOFOL N/A 05/17/2021   Surgeon: Milus Banister, MD; mild erythema and granularity of the colonic mucosa with benign biopsies, internal hemorrhoids suspected to be etiology of rectal bleeding.   ESOPHAGOGASTRODUODENOSCOPY (EGD) WITH PROPOFOL N/A 05/17/2021   Surgeon: Milus Banister, MD; very mild, nonspecific distal gastritis s/p biopsied, otherwise normal exam.  Gastric biopsy was benign, negative for H. pylori.   GIVENS CAPSULE STUDY N/A 11/16/2021   Surgeon: Eloise Harman, DO; poor and only able to identify first gastric image.    Current Outpatient Medications  Medication Sig Dispense Refill   acetaminophen (TYLENOL) 325 MG tablet Take 650 mg by mouth every 6 (six) hours as needed for moderate pain.     albuterol (VENTOLIN HFA) 108 (90 Base) MCG/ACT inhaler Inhale 2 puffs into the lungs every 4 (four) hours as needed for wheezing or shortness of breath. 18 g 2   ascorbic acid (VITAMIN C) 500 MG tablet Take 1 tablet (500 mg total) by mouth daily. 30 tablet 1   atorvastatin (LIPITOR) 10 MG tablet Take 1 tablet (10 mg total) by mouth daily. 90 tablet 3   Blood Glucose Monitoring Suppl (ONE TOUCH ULTRA 2) w/Device KIT 1 each by Does not apply route 3 (three) times daily. 1 kit 1   buPROPion (WELLBUTRIN XL) 150 MG 24 hr tablet TAKE 1 TABLET(150 MG) BY MOUTH DAILY 30 tablet 5   cetirizine (ZYRTEC) 10 MG tablet TAKE 1 TABLET BY MOUTH DAILY 60 tablet 0   Cholecalciferol (VITAMIN D) 50 MCG (2000 UT) tablet Take 2,000 Units by mouth daily.     ciclopirox (PENLAC) 8 % solution Apply topically at bedtime. Apply over nail and surrounding skin. Apply daily over previous coat. After seven (7) days, may remove with alcohol and continue cycle. 6.6 mL 5   clotrimazole-betamethasone (LOTRISONE) cream APPLY TOPICALLY TO THE AFFECTED AREA TWICE DAILY 45 g 1   dexlansoprazole (DEXILANT) 60 MG capsule Take 1 capsule  (60 mg total) by mouth daily. 30 capsule 3   ferrous sulfate (FEROSUL) 325 (65 FE) MG tablet TAKE 1 TABLET BY MOUTH EVERY MORNING 90 tablet 1   fluticasone (FLONASE) 50 MCG/ACT nasal spray Place 2 sprays into both nostrils daily as needed for allergies. 16 g 3   furosemide (LASIX) 80 MG tablet Take 1 tablet (80 mg total) by mouth 2 (two) times daily. 180 tablet 3   glucose blood (ONETOUCH ULTRA) test strip 1 each by Other route 3 (three) times daily. Use to check blood sugar three times daily as instructed 100 each 11   hydrocortisone (ANUSOL-HC) 2.5 % rectal cream Place 1 Application rectally 2 (two) times daily. 30 g 1   Lancets MISC Check fbs daily 100 each 11   metoprolol succinate (TOPROL-XL) 100 MG 24 hr tablet TAKE 1 TABLET(100 MG) BY MOUTH DAILY 30 tablet 0   Multiple Vitamin (MULTIVITAMIN) tablet Take 1 tablet by mouth daily.  ofloxacin (OCUFLOX) 0.3 % ophthalmic solution Place 1 drop into eye(s) 4 (four) times daily as needed for eye infection. 5 mL 0   Oxycodone HCl 10 MG TABS Take 1-2 tablets (10-20 mg total) by mouth 3 (three) times daily as needed. 180 tablet 0   Oxycodone HCl 10 MG TABS Take 1-2 tablets (10-20 mg total) by mouth 3 (three) times daily as needed. 180 tablet 0   Oxycodone HCl 10 MG TABS Take 1-2 tablets (10-20 mg total) by mouth 3 (three) times daily as needed. 180 tablet 0   oxyCODONE-acetaminophen (PERCOCET) 10-325 MG tablet Take 1-2 tablets by mouth 3 (three) times daily as needed. 180 tablet 0   potassium chloride SA (KLOR-CON M) 20 MEQ tablet TAKE 2 TABLETS BY MOUTH DAILY 30 tablet 0   prazosin (MINIPRESS) 2 MG capsule TAKE 1 CAPSULE(2 MG) BY MOUTH AT BEDTIME 90 capsule 0   sodium chloride (OCEAN) 0.65 % SOLN nasal spray Place 1 spray into both nostrils as needed for congestion. 88 mL 0   sucralfate (CARAFATE) 1 g tablet Take 1 tablet (1 g total) by mouth 4 (four) times daily -  with meals and at bedtime. 120 tablet 1   tirzepatide (MOUNJARO) 15 MG/0.5ML Pen  Inject 15 mg into the skin once a week. 6 mL 3   venlafaxine XR (EFFEXOR XR) 75 MG 24 hr capsule Take 2 capsules (150 mg total) by mouth daily with breakfast. 60 capsule 5   zolpidem (AMBIEN) 10 MG tablet TAKE 1 TABLET(10 MG) BY MOUTH AT BEDTIME AS NEEDED FOR SLEEP 30 tablet 2   buprenorphine (BUTRANS) 20 MCG/HR PTWK Place 1 patch onto the skin once a week. (Patient not taking: Reported on 03/21/2022) 4 patch 0   No current facility-administered medications for this visit.    Allergies as of 03/21/2022   (No Known Allergies)    Family History  Problem Relation Age of Onset   Diabetes Mother    Hypertension Mother    Hyperlipidemia Mother    Thyroid disease Mother    Diabetes Father    Hyperlipidemia Father    Hypertension Father    Depression Father    Anxiety disorder Father    Sleep apnea Father    Colon cancer Neg Hx    Stomach cancer Neg Hx     Social History   Socioeconomic History   Marital status: Married    Spouse name: Not on file   Number of children: Not on file   Years of education: Not on file   Highest education level: Not on file  Occupational History   Occupation: stay at home spouse  Tobacco Use   Smoking status: Never   Smokeless tobacco: Never  Vaping Use   Vaping Use: Never used  Substance and Sexual Activity   Alcohol use: Not Currently    Comment: occasional   Drug use: Not Currently    Types: Marijuana    Comment: occasionally   Sexual activity: Yes  Other Topics Concern   Not on file  Social History Narrative   Lives at home with wife and two children.  Four children in all.     Social Determinants of Health   Financial Resource Strain: Not on file  Food Insecurity: Not on file  Transportation Needs: Not on file  Physical Activity: Not on file  Stress: Not on file  Social Connections: Not on file    Review of Systems: Gen: Denies fever, chills, cold or flulike symptoms, presyncope, syncope.  CV: Denies chest pain,  palpitations. Resp: Denies dyspnea, cough. GI: See HPI Derm: Denies rash, itching, dry skin Psych: Denies depression, anxiety, memory loss, confusion. No homicidal or suicidal ideation.  Heme: Denies bruising, bleeding, and enlarged lymph nodes.  Physical Exam: BP 115/77 (BP Location: Left Arm, Patient Position: Sitting, Cuff Size: Large) Comment (Cuff Size): thigh cuff  Pulse 92   Temp 98.1 F (36.7 C) (Oral)   Ht 5\' 8"  (1.727 m)   Wt (!) 436 lb (197.8 kg)   SpO2 97%   BMI 66.29 kg/m  General:   Alert and oriented. No distress noted. Pleasant and cooperative.  Head:  Normocephalic and atraumatic. Eyes:  Conjuctiva clear without scleral icterus. Heart:  S1, S2 present without murmurs appreciated. Lungs:  Clear to auscultation bilaterally. No wheezes, rales, or rhonchi. No distress.  Abdomen:  +BS, soft, non-tender and non-distended. No rebound or guarding. No HSM or masses noted. Msk:  Symmetrical without gross deformities. Normal posture. Extremities:  Without edema. Neurologic:  Alert and  oriented x4 Psych:  Normal mood and affect.    Assessment:  47 y.o. male with history of HTN, diabetes, chronic respiratory failure since COVID in 2021 requiring 2-3 L supplemental O2 continuously, morbid obesity, chronic microcytic anemia at least since 2019, rectal bleeding, hemorrhoids, GERD, chronic upper abdominal pain, presenting today for follow-up.   Rectal bleeding:  Chronic and suspected to be secondary to hemorrhoids.  Colonoscopy in April 2023 with mild erythema and granularity of the colonic mucosa with benign biopsies and internal hemorrhoids.  Suspected colonic mucosal abnormalities likely secondary to colon prep. He has undergone hemorrhoid banding x 3 but this was complicated by body habitus and only able to band in the neutral position. Patient reports no improvement with bandings and unfortunately continues with rectal bleeding daily to every other day. Reports rectal bleeding  is usually preceded by upper abdominal pain which is also chronic since 2021.  Denies constipation, diarrhea, rectal pain.  Prior EGD with mild gastritis. Capsule attempt unsuccessful due to poor prep. Imaging with CT A/P with contrast and CT enterography without bowel abnormalities.   Discussed with patient that I suspect his bleeding is from hemorrhoids, but is feels there must be something else causing his bleeding. Discussed pursuing a flex sig to re-evaluate his lower GI tract as well as arranging an EGD with capsule placement to re-evaluate upper abdomen and take a look at his small bowel.  He would like to proceed with procedures. We discussed surgical evaluation for hemorrhoid management, but he isn't interested in this. Also states he doesn't want another banding if he can help it.   Black stool:  Reports a couple episodes of black tarry stool. On PPI BID and denies NSAIDs. He does take iron daily. No pepto bismol. Also with chronic epigastric burning, previously with mild gastris on EGD in April 2023. Unclear if he is having true melena. We are planning on EGD with capsule placement in the near future for IDA, rectal bleeding, which will also re-evaluate upper GI tract for gastritis, duodenitis, PUD.   IDA:  Chronic microcytic anemia dating back at least to 2019 with hemoglobin in the 9-11 range. Previously with iron 22 (L), saturation 7% (L), ferritin within normal limits in September 2021 and started on oral iron at that time. Most recent hemoglobin 9.24 September 2021. Iron panel wnl October 2023.  Has never seen hematology. Prior evaluation with EGD and colonoscopy in April 2023 with mild gastritis with benign biopsies, mild  erythema and granulation of the colonic mucosa with benign biopsy, internal hemorrhoids.  Attempted Givens capsule, but prep was poor and only able to identify first gastric image.  CT enterography December 2023 with no significant bowel abnormalities.  Hemorrhoid banding's  have been attempted previously, difficult due to body habitus and only able to band in neutral position with no real improvement in rectal bleeding. Continues with rectal bleeding daily to every other day and also reporting couple episodes of black tarry stool recently.  On PPI twice daily.  Denies NSAIDs.  Etiology of IDA could be significant blood loss from hemorrhoids.  With reports of black stools unable to rule out bleeding from upper GI tract or small bowel though black stools could have been secondary to iron. We will arrange EGD with capsule placement with modified prep to allow for better visualization and also arrange flex sig to re-evaluate rectal bleeding.   GERD: Chronic.  Improved with pantoprazole 40 mg twice daily, but still not adequately managed.  Will try him on Dexilant.  Epigastric pain: Chronic, intermittent epigastric pain since COVID in 2021.  Occasional nausea.  Some improvement with PPI twice daily, but still with daily symptoms. GERD has improved, but not adequately managed in is likely contributing to his symptoms. Also notes some improvement after a bowel movement raising the question of IBS. Seems less consistent with biliary etiology though can't rule this out. Gastroparesis also in question in the setting of diabetes. Recently with black tarry stools raises the question of PUD though he does take oral iron.  Denies NSAIDs. Prior EGD with very mild gastritis, biopsies benign.  Prior CT A/P with contrast in March 2023 with no findings to explain abdominal pain.  CT enterography December 2023 with no acute findings.   We will change PPI, start Carafate, and arrange EGD to reevaluate upper GI tract.  Consider HIDA in the future.     Plan:  CBC iron panel, CMP Flex sig + EGD + capsule placement with propofol with Dr. Abbey Chatters. The risks, benefits, and alternatives have been discussed with the patient in detail. The patient states understanding and desires to proceed.  ASA  3  Hold iron x 10 days prior.   Hold mounjaro x 1 week prior.   Clear liquids x 1 day prior.   1/2 bowel prep day prior Stop pantoprazole Start Dexilant 60 mg daily.  Start Carafate 1 g TID before meals and at bedtime.  Anusol cream BID x 10 days.  Follow-up after procedures.    Aliene Altes, PA-C [facility] 03/21/2022

## 2022-03-21 ENCOUNTER — Other Ambulatory Visit: Payer: Self-pay | Admitting: Gastroenterology

## 2022-03-21 ENCOUNTER — Encounter: Payer: Self-pay | Admitting: Gastroenterology

## 2022-03-21 ENCOUNTER — Ambulatory Visit: Payer: 59 | Admitting: Gastroenterology

## 2022-03-21 ENCOUNTER — Telehealth: Payer: Self-pay | Admitting: Gastroenterology

## 2022-03-21 VITALS — BP 115/77 | HR 92 | Temp 98.1°F | Ht 68.0 in | Wt >= 6400 oz

## 2022-03-21 DIAGNOSIS — K921 Melena: Secondary | ICD-10-CM

## 2022-03-21 DIAGNOSIS — K219 Gastro-esophageal reflux disease without esophagitis: Secondary | ICD-10-CM

## 2022-03-21 DIAGNOSIS — K649 Unspecified hemorrhoids: Secondary | ICD-10-CM

## 2022-03-21 DIAGNOSIS — D649 Anemia, unspecified: Secondary | ICD-10-CM | POA: Diagnosis not present

## 2022-03-21 DIAGNOSIS — K625 Hemorrhage of anus and rectum: Secondary | ICD-10-CM

## 2022-03-21 DIAGNOSIS — R1013 Epigastric pain: Secondary | ICD-10-CM | POA: Diagnosis not present

## 2022-03-21 MED ORDER — HYDROCORTISONE (PERIANAL) 2.5 % EX CREA: 1.0000 | TOPICAL_CREAM | Freq: Two times a day (BID) | CUTANEOUS | 1 refills | Status: DC

## 2022-03-21 MED ORDER — SUCRALFATE 1 G PO TABS: 1.0000 g | ORAL_TABLET | Freq: Three times a day (TID) | ORAL | 1 refills | Status: DC

## 2022-03-21 MED ORDER — DEXLANSOPRAZOLE 60 MG PO CPDR: 60.0000 mg | DELAYED_RELEASE_CAPSULE | Freq: Every day | ORAL | 3 refills | Status: DC

## 2022-03-21 NOTE — Patient Instructions (Addendum)
Have labs completed at [place].  We will arrange to have an upper endoscopy with capsule placement in the near future with Dr. Abbey Chatters at [facility].  You need to hold iron for 10 days before your procedures.  Hold Mounjaro x 1 week before procedures.  You will complete one half bowel prep before your procedure.  You will also be on clear liquids today before your procedure.  Stop pantoprazole.  Start Dexilant 60 mg daily.  Start Carafate 1 g 3 times daily before meals and at bedtime.  Dissolve the tablet in 2 tablespoons of water and drink the slurry.  Use Anusol rectal cream twice daily for 10 days to see if this improves your rectal bleeding.  We will follow-up with you after your procedures.  Do not hesitate to call sooner if you have questions or concerns.  Will be reaching out to you with your lab results.  Aliene Altes, PA-C [facility]

## 2022-03-21 NOTE — Telephone Encounter (Signed)
Tammy: Please add flex sig to EGD/capsule. This was already discussed with patient who is agreeable. Dx: Rectal bleeding.

## 2022-03-22 ENCOUNTER — Other Ambulatory Visit (HOSPITAL_COMMUNITY): Payer: Self-pay

## 2022-03-23 LAB — COMPLETE METABOLIC PANEL WITH GFR
AG Ratio: 1.3 (calc) (ref 1.0–2.5)
ALT: 11 U/L (ref 9–46)
AST: 14 U/L (ref 10–40)
Albumin: 4.2 g/dL (ref 3.6–5.1)
Alkaline phosphatase (APISO): 72 U/L (ref 36–130)
BUN/Creatinine Ratio: 11 (calc) (ref 6–22)
BUN: 14 mg/dL (ref 7–25)
CO2: 29 mmol/L (ref 20–32)
Calcium: 9.6 mg/dL (ref 8.6–10.3)
Chloride: 103 mmol/L (ref 98–110)
Creat: 1.33 mg/dL — ABNORMAL HIGH (ref 0.60–1.29)
Globulin: 3.3 g/dL (calc) (ref 1.9–3.7)
Glucose, Bld: 89 mg/dL (ref 65–99)
Potassium: 4.3 mmol/L (ref 3.5–5.3)
Sodium: 142 mmol/L (ref 135–146)
Total Bilirubin: 0.3 mg/dL (ref 0.2–1.2)
Total Protein: 7.5 g/dL (ref 6.1–8.1)
eGFR: 67 mL/min/{1.73_m2} (ref >=60)

## 2022-03-23 LAB — CBC WITH DIFFERENTIAL/PLATELET
Absolute Monocytes: 1003 cells/uL — ABNORMAL HIGH (ref 200–950)
Basophils Absolute: 91 cells/uL (ref 0–200)
Basophils Relative: 0.8 %
Eosinophils Absolute: 171 cells/uL (ref 15–500)
Eosinophils Relative: 1.5 %
HCT: 32.5 % — ABNORMAL LOW (ref 38.5–50.0)
Hemoglobin: 10 g/dL — ABNORMAL LOW (ref 13.2–17.1)
Lymphs Abs: 3215 cells/uL (ref 850–3900)
MCH: 21.1 pg — ABNORMAL LOW (ref 27.0–33.0)
MCHC: 30.8 g/dL — ABNORMAL LOW (ref 32.0–36.0)
MCV: 68.4 fL — ABNORMAL LOW (ref 80.0–100.0)
MPV: 12 fL (ref 7.5–12.5)
Monocytes Relative: 8.8 %
Neutro Abs: 6920 cells/uL (ref 1500–7800)
Neutrophils Relative %: 60.7 %
Platelets: 266 10*3/uL (ref 140–400)
RBC: 4.75 10*6/uL (ref 4.20–5.80)
RDW: 15.3 % — ABNORMAL HIGH (ref 11.0–15.0)
Total Lymphocyte: 28.2 %
WBC: 11.4 10*3/uL — ABNORMAL HIGH (ref 3.8–10.8)

## 2022-03-23 LAB — IRON,TIBC AND FERRITIN PANEL
%SAT: 17 % (calc) — ABNORMAL LOW (ref 20–48)
Ferritin: 153 ng/mL (ref 38–380)
Iron: 46 ug/dL — ABNORMAL LOW (ref 50–180)
TIBC: 272 mcg/dL (calc) (ref 250–425)

## 2022-03-23 LAB — CBC MORPHOLOGY

## 2022-03-25 ENCOUNTER — Telehealth (INDEPENDENT_AMBULATORY_CARE_PROVIDER_SITE_OTHER): Payer: Self-pay | Admitting: *Deleted

## 2022-03-25 ENCOUNTER — Encounter (INDEPENDENT_AMBULATORY_CARE_PROVIDER_SITE_OTHER): Payer: Self-pay | Admitting: *Deleted

## 2022-03-25 ENCOUNTER — Other Ambulatory Visit (HOSPITAL_COMMUNITY): Payer: Self-pay

## 2022-03-25 ENCOUNTER — Encounter: Payer: Self-pay | Admitting: *Deleted

## 2022-03-25 MED ORDER — PEG 3350-KCL-NA BICARB-NACL 420 G PO SOLR: 4000.0000 mL | Freq: Once | ORAL | 0 refills | Status: AC

## 2022-03-25 MED ORDER — OXYCODONE HCL 10 MG PO TABS
10.0000 mg | ORAL_TABLET | Freq: Three times a day (TID) | ORAL | 0 refills | Status: DC | PRN
  Filled 2022-03-29: qty 40, 7d supply, fill #0
  Filled 2022-03-29: qty 140, 23d supply, fill #0

## 2022-03-25 NOTE — Telephone Encounter (Signed)
Noted  

## 2022-03-25 NOTE — Telephone Encounter (Signed)
Called pt and scheduled for Flex sigg/egd/givens capsule 3/25. Aware will send instructions/pre-op appt prior. Rx for 1/2 prep sent to pharmacy per [facility]   Per [place]. No PA required.  Decision ID #: [id number]

## 2022-03-25 NOTE — Telephone Encounter (Signed)
Spouse called back and is requesting the procedures to be done in [place] like prior. Please advise Cyril Mourning thanks!

## 2022-03-25 NOTE — Telephone Encounter (Signed)
The only reason procedures were done in [name] initially was due to BMI. Now that he has lost quite a bit of weight, he can have procedures here at [name] with his primary GI doctor, Dr. Abbey Chatters. I am not sure [name] would be able to accommodate him anytime soon or if they would be willing as they usually only do cases for [name] if we are not able.

## 2022-03-26 ENCOUNTER — Other Ambulatory Visit (INDEPENDENT_AMBULATORY_CARE_PROVIDER_SITE_OTHER): Payer: Self-pay | Admitting: *Deleted

## 2022-03-26 DIAGNOSIS — D649 Anemia, unspecified: Secondary | ICD-10-CM

## 2022-03-26 NOTE — Telephone Encounter (Signed)
He is still on as scheduled previously. Cyril Mourning, when do you want to add to your schedule for reading? Thanks!

## 2022-03-26 NOTE — Telephone Encounter (Signed)
Pt spouse is aware and they are still requesting he go to for procedures.

## 2022-03-26 NOTE — Telephone Encounter (Signed)
Spoke with patient. Ricardo Lopez stated Ricardo Lopez was nervous about having anything major at [facility] as Ricardo Lopez got a wound infection when Ricardo Lopez was admitted in 2021 for Ricardo Lopez. Ricardo Lopez did not have any surgery during that time. We discussed that Dr. Ardis Hughs who did his last procedures is not in practice at this time. Offered to send a message to Dr. Rush Landmark to see if they would be willing to accommodate, but patient stated Ricardo Lopez would be ok to have procedures at [facility] with Dr. Abbey Chatters.

## 2022-03-26 NOTE — Telephone Encounter (Signed)
You can put it on my schedule for 3/27.

## 2022-03-28 ENCOUNTER — Other Ambulatory Visit (HOSPITAL_COMMUNITY): Payer: Self-pay

## 2022-03-29 ENCOUNTER — Encounter: Payer: Self-pay | Admitting: Internal Medicine

## 2022-03-29 ENCOUNTER — Other Ambulatory Visit (HOSPITAL_COMMUNITY): Payer: Self-pay

## 2022-04-01 ENCOUNTER — Other Ambulatory Visit: Payer: Self-pay | Admitting: Family Medicine

## 2022-04-01 NOTE — Progress Notes (Signed)
[facility] [address], [place] [address]   Clinic Day:  04/02/2022  Referring physician: Erenest Rasher, PA-C  Patient Care Team: Susy Frizzle, MD as PCP - General (Family Medicine) Minus Breeding, MD as PCP - Cardiology (Cardiology) Derek Jack, MD as Medical Oncologist (Hematology)   ASSESSMENT & PLAN:   Assessment:  1.  Microcytic anemia: - He reports that he has been anemic all his life.  No prior history of blood transfusion. - Takes iron tablet daily for the last 3 years.  Positive for ice pica. - Personal history of sickle cell trait. - Reports on and off rectal bleeding for the past few years.  He reports history of banding for internal hemorrhoids. - Lost 100 pounds / 4 to 5 months while on Mounjaro. - Colonoscopy (05/17/2021): Internal hemorrhoids.  2.  Social/family history: - He is currently on disability since long COVID (initial diagnosis 09/28/2019), which left him on continuous oxygen via nasal cannula.  He worked as a Systems developer prior to disability.  Non-smoker. - Mother has anemia.  Maternal grandmother had sickle cell trait.  Son has sickle cell trait.  Maternal grandfather had lung cancer.  Plan:  1.  Microcytic anemia: - We have reviewed recent labs from 03/22/2022.  Hemoglobin is 10 with MCV of 68.4.  Ferritin is 153% saturation 17. - He has microcytosis, likely from sickle cell trait. - He has mildly elevated creatinine since December 2023.  Also has ongoing rectal bleeding. - Will check for coexisting nutritional deficiencies including [id number], folic acid, MMA, copper.  Will check for hemolysis with LDH, reticulocyte count and direct Coombs test. - Will check hemoglobin electrophoresis to confirm sickle cell trait versus compound sickle cell syndromes. - Will check serum Cystatin C to see if he has CKD. - RTC 2 to 3 weeks for follow-up.   Orders Placed This Encounter  Procedures   Vitamin B12    Standing  Status:   Future    Number of Occurrences:   1    Standing Expiration Date:   04/02/2023   Folate    Standing Status:   Future    Number of Occurrences:   1    Standing Expiration Date:   04/02/2023   Methylmalonic acid, serum    Standing Status:   Future    Number of Occurrences:   1    Standing Expiration Date:   04/02/2023   Copper, serum    Standing Status:   Future    Number of Occurrences:   1    Standing Expiration Date:   04/02/2023   Lactate dehydrogenase    Standing Status:   Future    Number of Occurrences:   1    Standing Expiration Date:   04/02/2023   Reticulocytes    Standing Status:   Future    Number of Occurrences:   1    Standing Expiration Date:   04/02/2023   Protein electrophoresis, serum    Standing Status:   Future    Number of Occurrences:   1    Standing Expiration Date:   04/02/2023   Kappa/lambda light chains    Standing Status:   Future    Number of Occurrences:   1    Standing Expiration Date:   04/02/2023   Hgb Fractionation Cascade    Standing Status:   Future    Number of Occurrences:   1    Standing Expiration Date:  04/02/2023   Miscellaneous [facility] test (send-out)    Standing Status:   Future    Number of Occurrences:   1    Standing Expiration Date:   04/02/2023    Order Specific Question:   Test name / description:    Answer:   Test #: [id number] Serum Cystatin C    Order Specific Question:   Release to patient    Answer:   Immediate   Direct antiglobulin test    Standing Status:   Future    Number of Occurrences:   1    Standing Expiration Date:   04/02/2023      I,Alexis Herring,acting as a scribe for Derek Jack, MD.,have documented all relevant documentation on the behalf of Derek Jack, MD,as directed by  Derek Jack, MD while in the presence of Derek Jack, MD.   I, Derek Jack MD, have reviewed the above documentation for accuracy and completeness, and I agree with the above.   Derek Jack, MD   3/12/202412:48 PM  CHIEF COMPLAINT/PURPOSE OF CONSULT:   Diagnosis: Anemia  Current Therapy: Under workup  HISTORY OF PRESENT ILLNESS:   Nemesio is a 47 y.o. male presenting to clinic today for evaluation of IDA at the request of [facility].  He has been following with [facility] for rectal bleeding and hemorrhoid banding. Per PA Kristen Harper's OV note from 03/01/22 he had an abnormal HGB of  9.4 in 09/2021.  His last iron panel on 03/22/22 showed an iron of 46 and iron sat of 17%. His WBC count at that time was 11.4, HGB was 10.0, HCT was 32.5, MCV was 68.4, and platelets were 266.  Today, he states that he is doing well overall. His appetite level is at 100%. His energy level is at 60-70%.  He reports hematochezia on a regular basis.  He has been on iron tablets daily for the past 3 years.  He reports ice pica.  No prior history of blood transfusion.  No prior history of intravenous iron therapy.  He had COVID diagnosed on 09/28/2019 and has subsequently developed a long COVID and is on continuous oxygen.  He is able to do all his ADLs and IADLs at home.  PAST MEDICAL HISTORY:   Past Medical History: Past Medical History:  Diagnosis Date   Anemia    Anxiety and depression    B12 deficiency    Back pain    COVID    9/21- hypoxia requiring oxygen   Diabetes mellitus without complication (HCC)    Frequency of urination    GERD (gastroesophageal reflux disease)    Gluteal abscess    ecoli after covid hospitalization 2021   Headache    Hypertension    Obesity    Sleep apnea    Stomach ulcer    Tachycardia    Thoracic aortic aneurysm [facility])     Surgical History: Past Surgical History:  Procedure Laterality Date   BIOPSY  05/17/2021   Procedure: BIOPSY;  Surgeon: Milus Banister, MD;  Location: [facility];  Service: Gastroenterology;;  EGD and COLON   COLONOSCOPY WITH PROPOFOL N/A 05/17/2021   Surgeon: Milus Banister, MD; mild erythema and granularity of the colonic  mucosa with benign biopsies, internal hemorrhoids suspected to be etiology of rectal bleeding.   ESOPHAGOGASTRODUODENOSCOPY (EGD) WITH PROPOFOL N/A 05/17/2021   Surgeon: Milus Banister, MD; very mild, nonspecific distal gastritis s/p biopsied, otherwise normal exam.  Gastric biopsy was benign, negative for H. pylori.  GIVENS CAPSULE STUDY N/A 11/16/2021   Surgeon: Eloise Harman, DO; poor and only able to identify first gastric image.    Social History: Social History   Socioeconomic History   Marital status: Married    Spouse name: Not on file   Number of children: Not on file   Years of education: Not on file   Highest education level: Not on file  Occupational History   Occupation: stay at home spouse  Tobacco Use   Smoking status: Never   Smokeless tobacco: Never  Vaping Use   Vaping Use: Never used  Substance and Sexual Activity   Alcohol use: Not Currently    Comment: occasional   Drug use: Not Currently    Types: Marijuana    Comment: occasionally   Sexual activity: Yes  Other Topics Concern   Not on file  Social History Narrative   Lives at home with wife and two children.  Four children in all.     Social Determinants of Health   Financial Resource Strain: Not on file  Food Insecurity: No Food Insecurity (04/02/2022)   Hunger Vital Sign    Worried About Running Out of Food in the Last Year: Never true    Ran Out of Food in the Last Year: Never true  Transportation Needs: No Transportation Needs (04/02/2022)   PRAPARE - Hydrologist (Medical): No    Lack of Transportation (Non-Medical): No  Physical Activity: Not on file  Stress: Not on file  Social Connections: Not on file  Intimate Partner Violence: Not At Risk (04/02/2022)   Humiliation, Afraid, Rape, and Kick questionnaire    Fear of Current or Ex-Partner: No    Emotionally Abused: No    Physically Abused: No    Sexually Abused: No    Family History: Family History   Problem Relation Age of Onset   Diabetes Mother    Hypertension Mother    Hyperlipidemia Mother    Thyroid disease Mother    Diabetes Father    Hyperlipidemia Father    Hypertension Father    Depression Father    Anxiety disorder Father    Sleep apnea Father    Colon cancer Neg Hx    Stomach cancer Neg Hx     Current Medications:  Current Outpatient Medications:    acetaminophen (TYLENOL) 325 MG tablet, Take 650 mg by mouth every 6 (six) hours as needed for moderate pain., Disp: , Rfl:    albuterol (VENTOLIN HFA) 108 (90 Base) MCG/ACT inhaler, Inhale 2 puffs into the lungs every 4 (four) hours as needed for wheezing or shortness of breath., Disp: 18 g, Rfl: 2   ascorbic acid (VITAMIN C) 500 MG tablet, Take 1 tablet (500 mg total) by mouth daily., Disp: 30 tablet, Rfl: 1   atorvastatin (LIPITOR) 10 MG tablet, Take 1 tablet (10 mg total) by mouth daily., Disp: 90 tablet, Rfl: 3   Blood Glucose Monitoring Suppl (ONE TOUCH ULTRA 2) w/Device KIT, 1 each by Does not apply route 3 (three) times daily., Disp: 1 kit, Rfl: 1   buPROPion (WELLBUTRIN XL) 150 MG 24 hr tablet, TAKE 1 TABLET(150 MG) BY MOUTH DAILY, Disp: 30 tablet, Rfl: 5   cetirizine (ZYRTEC) 10 MG tablet, TAKE 1 TABLET BY MOUTH DAILY, Disp: 60 tablet, Rfl: 0   Cholecalciferol (VITAMIN D) 50 MCG (2000 UT) tablet, Take 2,000 Units by mouth daily., Disp: , Rfl:    ciclopirox (PENLAC) 8 % solution, Apply topically  at bedtime. Apply over nail and surrounding skin. Apply daily over previous coat. After seven (7) days, may remove with alcohol and continue cycle., Disp: 6.6 mL, Rfl: 5   clotrimazole-betamethasone (LOTRISONE) cream, APPLY TOPICALLY TO THE AFFECTED AREA TWICE DAILY, Disp: 45 g, Rfl: 1   dexlansoprazole (DEXILANT) 60 MG capsule, Take 1 capsule (60 mg total) by mouth daily., Disp: 30 capsule, Rfl: 3   ferrous sulfate (FEROSUL) 325 (65 FE) MG tablet, TAKE 1 TABLET BY MOUTH EVERY MORNING, Disp: 90 tablet, Rfl: 1   fluticasone  (FLONASE) 50 MCG/ACT nasal spray, Place 2 sprays into both nostrils daily as needed for allergies., Disp: 16 g, Rfl: 3   glucose blood (ONETOUCH ULTRA) test strip, 1 each by Other route 3 (three) times daily. Use to check blood sugar three times daily as instructed, Disp: 100 each, Rfl: 11   hydrocortisone (ANUSOL-HC) 2.5 % rectal cream, Place 1 Application rectally 2 (two) times daily., Disp: 30 g, Rfl: 1   Lancets MISC, Check fbs daily, Disp: 100 each, Rfl: 11   metoprolol succinate (TOPROL-XL) 100 MG 24 hr tablet, TAKE 1 TABLET(100 MG) BY MOUTH DAILY, Disp: 30 tablet, Rfl: 0   Multiple Vitamin (MULTIVITAMIN) tablet, Take 1 tablet by mouth daily., Disp: , Rfl:    ofloxacin (OCUFLOX) 0.3 % ophthalmic solution, Place 1 drop into eye(s) 4 (four) times daily as needed for eye infection., Disp: 5 mL, Rfl: 0   Oxycodone HCl 10 MG TABS, Take 1-2 tablets (10-20 mg total) by mouth 3 (three) times daily as needed., Disp: 180 tablet, Rfl: 0   Oxycodone HCl 10 MG TABS, Take 1-2 tablets (10-20 mg total) by mouth 3 (three) times daily as needed., Disp: 180 tablet, Rfl: 0   Oxycodone HCl 10 MG TABS, Take 1-2 tablets (10-20 mg total) by mouth 3 (three) times daily as needed., Disp: 180 tablet, Rfl: 0   Oxycodone HCl 10 MG TABS, Take 1-2 tablets (10-20 mg total) by mouth 3 (three) times daily as needed. 03/28/22, Disp: 180 tablet, Rfl: 0   oxyCODONE-acetaminophen (PERCOCET) 10-325 MG tablet, Take 1-2 tablets by mouth 3 (three) times daily as needed., Disp: 180 tablet, Rfl: 0   potassium chloride SA (KLOR-CON M) 20 MEQ tablet, TAKE 2 TABLETS BY MOUTH DAILY, Disp: 30 tablet, Rfl: 0   prazosin (MINIPRESS) 2 MG capsule, TAKE 1 CAPSULE(2 MG) BY MOUTH AT BEDTIME, Disp: 90 capsule, Rfl: 0   sodium chloride (OCEAN) 0.65 % SOLN nasal spray, Place 1 spray into both nostrils as needed for congestion., Disp: 88 mL, Rfl: 0   sucralfate (CARAFATE) 1 g tablet, TAKE 1 TABLET WITH MEALS AND AT BEDTIME MAX OF 4/DAY, Disp: 360 tablet,  Rfl: 0   tirzepatide (MOUNJARO) 15 MG/0.5ML Pen, Inject 15 mg into the skin once a week., Disp: 6 mL, Rfl: 3   venlafaxine XR (EFFEXOR XR) 75 MG 24 hr capsule, Take 2 capsules (150 mg total) by mouth daily with breakfast., Disp: 60 capsule, Rfl: 5   furosemide (LASIX) 80 MG tablet, Take 1 tablet (80 mg total) by mouth 2 (two) times daily., Disp: 180 tablet, Rfl: 3   zolpidem (AMBIEN) 10 MG tablet, TAKE 1 TABLET(10 MG) BY MOUTH AT BEDTIME AS NEEDED FOR SLEEP, Disp: 30 tablet, Rfl: 1   Allergies: No Known Allergies  REVIEW OF SYSTEMS:   Review of Systems  Constitutional:  Negative for chills, fatigue and fever.  HENT:   Negative for lump/mass, mouth sores, nosebleeds, sore throat and trouble swallowing.  Eyes:  Negative for eye problems.  Respiratory:  Positive for shortness of breath. Negative for cough.   Cardiovascular:  Positive for palpitations. Negative for chest pain and leg swelling.  Gastrointestinal:  Positive for nausea. Negative for abdominal pain, constipation, diarrhea and vomiting.  Genitourinary:  Negative for bladder incontinence, difficulty urinating, dysuria, frequency, hematuria and nocturia.   Musculoskeletal:  Negative for arthralgias, back pain, flank pain, myalgias and neck pain.  Skin:  Negative for itching and rash.  Neurological:  Positive for dizziness and headaches. Negative for numbness.  Hematological:  Does not bruise/bleed easily.  Psychiatric/Behavioral:  Positive for sleep disturbance. Negative for depression and suicidal ideas. The patient is not nervous/anxious.   All other systems reviewed and are negative.    VITALS:   Blood pressure (!) 142/86, pulse (!) 101, temperature 99.6 F (37.6 C), temperature source Tympanic, resp. rate 19, height '5\' 8"'$  (1.727 m), weight (!) 437 lb 6.4 oz (198.4 kg), SpO2 99 %.  Wt Readings from Last 3 Encounters:  04/02/22 (!) 437 lb 6.4 oz (198.4 kg)  03/21/22 (!) 436 lb (197.8 kg)  11/01/21 (!) 525 lb (238.1 kg)     Body mass index is 66.51 kg/m.   PHYSICAL EXAM:   Physical Exam Vitals and nursing note reviewed. Exam conducted with a chaperone present.  Constitutional:      Appearance: Normal appearance.  Cardiovascular:     Rate and Rhythm: Normal rate and regular rhythm.     Pulses: Normal pulses.     Heart sounds: Normal heart sounds.  Pulmonary:     Effort: Pulmonary effort is normal.     Breath sounds: Normal breath sounds.  Abdominal:     Palpations: Abdomen is soft. There is no hepatomegaly, splenomegaly or mass.     Tenderness: There is no abdominal tenderness.  Musculoskeletal:     Right lower leg: No edema.     Left lower leg: No edema.  Lymphadenopathy:     Cervical: No cervical adenopathy.     Right cervical: No superficial, deep or posterior cervical adenopathy.    Left cervical: No superficial, deep or posterior cervical adenopathy.     Upper Body:     Right upper body: No supraclavicular or axillary adenopathy.     Left upper body: No supraclavicular or axillary adenopathy.  Neurological:     General: No focal deficit present.     Mental Status: He is alert and oriented to person, place, and time.  Psychiatric:        Mood and Affect: Mood normal.        Behavior: Behavior normal.     LABS:      Latest Ref Rng & Units 03/22/2022    3:23 PM 10/18/2021    4:45 PM 08/10/2021   11:11 AM  CBC  WBC 3.8 - 10.8 Thousand/uL 11.4  7.7  8.1   Hemoglobin 13.2 - 17.1 g/dL 10.0  9.4  8.9   Hematocrit 38.5 - 50.0 % 32.5  32.6  31.2   Platelets 140 - 400 Thousand/uL 266  220  220       Latest Ref Rng & Units 03/22/2022    3:23 PM 01/18/2022   10:47 AM 10/18/2021    4:45 PM  CMP  Glucose 65 - 99 mg/dL 89   126   BUN 7 - 25 mg/dL 14   11   Creatinine 0.60 - 1.29 mg/dL 1.33  1.30  1.11   Sodium 135 - 146 mmol/L  142   142   Potassium 3.5 - 5.3 mmol/L 4.3   4.3   Chloride 98 - 110 mmol/L 103   102   CO2 20 - 32 mmol/L 29   36   Calcium 8.6 - 10.3 mg/dL 9.6   9.6   Total  Protein 6.1 - 8.1 g/dL 7.5   7.7   Total Bilirubin 0.2 - 1.2 mg/dL 0.3   0.2   AST 10 - 40 U/L 14   12   ALT 9 - 46 U/L 11   10      No results found for: "CEA1", "CEA" / No results found for: "CEA1", "CEA" No results found for: "PSA1" No results found for: "[id number]" No results found for: "CAN125"  No results found for: "TOTALPROTELP", "ALBUMINELP", "A1GS", "A2GS", "BETS", "BETA2SER", "GAMS", "MSPIKE", "SPEI" Lab Results  Component Value Date   TIBC 272 03/22/2022   TIBC 276 10/23/2021   TIBC 299 08/10/2021   FERRITIN 153 03/22/2022   FERRITIN 74 10/23/2021   FERRITIN 66 08/10/2021   IRONPCTSAT 17 (L) 03/22/2022   IRONPCTSAT 21 10/23/2021   IRONPCTSAT 21 08/10/2021   Lab Results  Component Value Date   LDH 135 04/02/2022   LDH 174 10/19/2019   LDH 449 (H) 09/29/2019    Pathology 05/17/21: FINAL MICROSCOPIC DIAGNOSIS:   A. COLON, RIGHT, BIOPSY:  - Colonic mucosa with no specific histopathologic changes  - Negative for acute inflammation, increased intraepithelial lymphocytes  or thickened subepithelial collagen table   B. COLON, LEFT, BIOPSY:  - Colonic mucosa with no specific histopathologic changes  - Negative for acute inflammation, increased intraepithelial lymphocytes  or thickened subepithelial collagen table   C. STOMACH, ANTRUM AND BODY, BIOPSY:  - Gastric antral and oxyntic mucosa with no specific histopathologic  changes  - Helicobacter pylori-like organisms are not identified on routine HE  stain   STUDIES:   No results found.

## 2022-04-02 ENCOUNTER — Inpatient Hospital Stay: Payer: 59

## 2022-04-02 ENCOUNTER — Inpatient Hospital Stay: Payer: 59 | Attending: Hematology | Admitting: Hematology

## 2022-04-02 ENCOUNTER — Encounter: Payer: Self-pay | Admitting: Hematology

## 2022-04-02 VITALS — BP 142/86 | HR 101 | Temp 99.6°F | Resp 19 | Ht 68.0 in | Wt >= 6400 oz

## 2022-04-02 DIAGNOSIS — D573 Sickle-cell trait: Secondary | ICD-10-CM | POA: Insufficient documentation

## 2022-04-02 DIAGNOSIS — I10 Essential (primary) hypertension: Secondary | ICD-10-CM | POA: Diagnosis not present

## 2022-04-02 DIAGNOSIS — D509 Iron deficiency anemia, unspecified: Secondary | ICD-10-CM | POA: Diagnosis not present

## 2022-04-02 DIAGNOSIS — E119 Type 2 diabetes mellitus without complications: Secondary | ICD-10-CM

## 2022-04-02 DIAGNOSIS — E669 Obesity, unspecified: Secondary | ICD-10-CM

## 2022-04-02 LAB — FOLATE: Folate: 25.4 ng/mL (ref >5.9)

## 2022-04-02 LAB — RETICULOCYTES
Immature Retic Fract: 24.3 % — ABNORMAL HIGH (ref 2.3–15.9)
RBC.: 4.68 MIL/uL (ref 4.22–5.81)
Retic Count, Absolute: 55.2 10*3/uL (ref 19.0–186.0)
Retic Ct Pct: 1.2 % (ref 0.4–3.1)

## 2022-04-02 LAB — DIRECT ANTIGLOBULIN TEST (NOT AT ARMC)
DAT, IgG: NEGATIVE
DAT, complement: NEGATIVE

## 2022-04-02 LAB — LACTATE DEHYDROGENASE: LDH: 135 U/L (ref 98–192)

## 2022-04-02 LAB — VITAMIN B12: Vitamin B-12: 782 pg/mL (ref 180–914)

## 2022-04-02 NOTE — Telephone Encounter (Signed)
Requested medication (s) are due for refill today -yes  Requested medication (s) are on the active medication list -yes  Future visit scheduled -no  Last refill: 12/27/21 #30 2RF  Notes to clinic: non delegated Rx  Requested Prescriptions  Pending Prescriptions Disp Refills   zolpidem (AMBIEN) 10 MG tablet [Pharmacy Med Name: ZOLPIDEM '10MG'$  TABLETS] 30 tablet     Sig: TAKE 1 TABLET(10 MG) BY MOUTH AT BEDTIME AS NEEDED FOR SLEEP     Not Delegated - Psychiatry:  Anxiolytics/Hypnotics Failed - 04/01/2022 10:25 PM      Failed - This refill cannot be delegated      Failed - Urine Drug Screen completed in last 360 days      Failed - Valid encounter within last 6 months    Recent Outpatient Visits           1 year ago Type 2 diabetes mellitus with hyperglycemia, without long-term current use of insulin ([place])   [place] Susy Frizzle, MD   1 year ago Acute midline low back pain, unspecified whether sciatica present   [place] Susy Frizzle, MD   1 year ago Depression, major, single episode, moderate ([place])   [place] Pickard, Cammie Mcgee, MD   1 year ago SOB (shortness of breath) on exertion   [place] Pickard, Cammie Mcgee, MD   1 year ago Upper respiratory tract infection, unspecified type   [place] Eulogio Bear, NP                 Requested Prescriptions  Pending Prescriptions Disp Refills   zolpidem (AMBIEN) 10 MG tablet [Pharmacy Med Name: ZOLPIDEM '10MG'$  TABLETS] 30 tablet     Sig: TAKE 1 TABLET(10 MG) BY MOUTH AT BEDTIME AS NEEDED FOR SLEEP     Not Delegated - Psychiatry:  Anxiolytics/Hypnotics Failed - 04/01/2022 10:25 PM      Failed - This refill cannot be delegated      Failed - Urine Drug Screen completed in last 360 days      Failed - Valid encounter within last 6 months    Recent Outpatient Visits           1 year ago Type 2 diabetes mellitus with  hyperglycemia, without long-term current use of insulin ([place])   [place] Susy Frizzle, MD   1 year ago Acute midline low back pain, unspecified whether sciatica present   [place] Susy Frizzle, MD   1 year ago Depression, major, single episode, moderate ([place])   [place] Pickard, Cammie Mcgee, MD   1 year ago SOB (shortness of breath) on exertion   [place] Pickard, Cammie Mcgee, MD   1 year ago Upper respiratory tract infection, unspecified type   [place] Eulogio Bear, NP

## 2022-04-02 NOTE — Patient Instructions (Addendum)
[facility]  Discharge Instructions  You were seen and examined today by Dr. Delton Coombes. Dr. Delton Coombes is a hematologist, meaning that he specializes in blood abnormalities. Dr. Delton Coombes discussed your past medical history, family history of cancers/blood conditions and the events that led to you being here today.  You were referred to Dr. Delton Coombes due to anemia.  Dr. Delton Coombes has recommended additional labs today to see if there are any nutritional deficiencies that are attributing to your ongoing anemia.  Follow-up as scheduled.  Thank you for choosing [facility] to provide your oncology and hematology care.   To afford each patient quality time with our provider, please arrive at least 15 minutes before your scheduled appointment time. You may need to reschedule your appointment if you arrive late (10 or more minutes). Arriving late affects you and other patients whose appointments are after yours.  Also, if you miss three or more appointments without notifying the office, you may be dismissed from the clinic at the provider's discretion.    Again, thank you for choosing [facility].  Our hope is that these requests will decrease the amount of time that you wait before being seen by our physicians.   If you have a lab appointment with the [facility] please come in thru the Main Entrance and check in at the main information desk.           _____________________________________________________________  Should you have questions after your visit to [facility], please contact our office at [phone] and follow the prompts.  Our office hours are 8:00 a.m. to 4:30 p.m. Monday - Thursday and 8:00 a.m. to 2:30 p.m. Friday.  Please note that voicemails left after 4:00 p.m. may not be returned until the following business day.  We are closed weekends and all major holidays.  You do have access to a nurse  24-7, just call the main number to the clinic [phone] and do not press any options, hold on the line and a nurse will answer the phone.    For prescription refill requests, have your pharmacy contact our office and allow 72 hours.    Masks are optional in the cancer centers. If you would like for your care team to wear a mask while they are taking care of you, please let them know. You may have one support person who is at least 48 years old accompany you for your appointments.

## 2022-04-03 LAB — MISC LABCORP TEST (SEND OUT): Labcorp test code: 121251

## 2022-04-03 LAB — KAPPA/LAMBDA LIGHT CHAINS
Kappa free light chain: 27.6 mg/L — ABNORMAL HIGH (ref 3.3–19.4)
Kappa, lambda light chain ratio: 1.64 (ref 0.26–1.65)
Lambda free light chains: 16.8 mg/L (ref 5.7–26.3)

## 2022-04-04 LAB — PROTEIN ELECTROPHORESIS, SERUM
A/G Ratio: 1 (ref 0.7–1.7)
Albumin ELP: 3.5 g/dL (ref 2.9–4.4)
Alpha-1-Globulin: 0.3 g/dL (ref 0.0–0.4)
Alpha-2-Globulin: 0.9 g/dL (ref 0.4–1.0)
Beta Globulin: 1.2 g/dL (ref 0.7–1.3)
Gamma Globulin: 1.3 g/dL (ref 0.4–1.8)
Globulin, Total: 3.6 g/dL (ref 2.2–3.9)
Total Protein ELP: 7.1 g/dL (ref 6.0–8.5)

## 2022-04-04 LAB — COPPER, SERUM: Copper: 110 ug/dL (ref 69–132)

## 2022-04-06 LAB — METHYLMALONIC ACID, SERUM: Methylmalonic Acid, Quantitative: 121 nmol/L (ref 0–378)

## 2022-04-08 LAB — HGB FRACTIONATION CASCADE
Hgb A2: 3.6 % — ABNORMAL HIGH (ref 1.8–3.2)
Hgb A: 69 % — ABNORMAL LOW (ref 96.4–98.8)
Hgb F: 0 % (ref 0.0–2.0)
Hgb S: 27.4 % — ABNORMAL HIGH

## 2022-04-08 LAB — HGB SOLUBILITY: Hgb Solubility: POSITIVE — AB

## 2022-04-08 NOTE — Patient Instructions (Signed)
Ricardo Lopez.  04/08/2022     @PREFPERIOPPHARMACY @   Your procedure is scheduled on  04/15/2022.   Report to [facility] at 1100 A.M.   Call this number if you have problems the morning of surgery:  [phone]  If you experience any cold or flu symptoms such as cough, fever, chills, shortness of breath, etc. between now and your scheduled surgery, please notify us at the above number.   Remember:  Follow the diet and prep instructions given to you by the office.    Take these medicines the morning of surgery with A SIP OF WATER          wellbutrin, zyrtec, dexilant, metoprolol, oxycodone(if needed), effexor.     Do not wear jewelry, make-up or nail polish.  Do not wear lotions, powders, or perfumes, or deodorant.  Do not shave 48 hours prior to surgery.  Men may shave face and neck.  Do not bring valuables to the hospital.  [facility] is not responsible for any belongings or valuables.  Contacts, dentures or bridgework may not be worn into surgery.  Leave your suitcase in the car.  After surgery it may be brought to your room.  For patients admitted to the hospital, discharge time will be determined by your treatment team.  Patients discharged the day of surgery will not be allowed to drive home and must have someone with them for 24 hours.    Special instructions:   DO NOT smoke tobacco or vape for 24 hours before your procedure.  Please read over the following fact sheets that you were given. Anesthesia Post-op Instructions and Care and Recovery After Surgery      Upper Endoscopy, Adult, Care After After the procedure, it is common to have a sore throat. It is also common to have: Mild stomach pain or discomfort. Bloating. Nausea. Follow these instructions at home: The instructions below may help you care for yourself at home. Your health care provider may give you more instructions. If you have questions, ask your health care provider. If you were  given a sedative during the procedure, it can affect you for several hours. Do not drive or operate machinery until your health care provider says that it is safe. If you will be going home right after the procedure, plan to have a responsible adult: Take you home from the hospital or clinic. You will not be allowed to drive. Care for you for the time you are told. Follow instructions from your health care provider about what you may eat and drink. Return to your normal activities as told by your health care provider. Ask your health care provider what activities are safe for you. Take over-the-counter and prescription medicines only as told by your health care provider. Contact a health care provider if you: Have a sore throat that lasts longer than one day. Have trouble swallowing. Have a fever. Get help right away if you: Vomit blood or your vomit looks like coffee grounds. Have bloody, black, or tarry stools. Have a very bad sore throat or you cannot swallow. Have difficulty breathing or very bad pain in your chest or abdomen. These symptoms may be an emergency. Get help right away. Call 911. Do not wait to see if the symptoms will go away. Do not drive yourself to the hospital. Summary After the procedure, it is common to have a sore throat, mild stomach discomfort, bloating, and nausea. If you were  given a sedative during the procedure, it can affect you for several hours. Do not drive until your health care provider says that it is safe. Follow instructions from your health care provider about what you may eat and drink. Return to your normal activities as told by your health care provider. This information is not intended to replace advice given to you by your health care provider. Make sure you discuss any questions you have with your health care provider. Document Revised: 04/18/2021 Document Reviewed: 04/18/2021 Elsevier Patient Education  [place].  Flexible  Sigmoidoscopy, Care After The following information offers guidance on how to care for yourself after your procedure. Your health care provider may also give you more specific instructions. If you have problems or questions, contact your health care provider. What can I expect after the procedure? After the procedure, it is common to have: Cramping or pain in your abdomen. Bloating. A small amount of blood with your stool. This may happen if a sample of tissue was removed for testing (biopsy). Follow these instructions at home: Eating and drinking Drink enough fluid to keep your urine pale yellow. Follow instructions from your health care provider about what you may eat and drink. Go back to your normal diet as told by your health care provider. Avoid heavy or fried foods. These may be hard to digest. Activity  If you were given a sedative during the procedure, it can affect you for several hours. Do not drive or operate machinery until your health care provider says that it is safe. Rest as told by your health care provider. Do not sit for a long time without moving. Get up to take short walks every 1-2 hours. This will improve blood flow and breathing. Ask for help if you feel weak or unsteady. Return to your normal activities as told by your health care provider. Ask your health care provider what activities are safe for you. General instructions Take over-the-counter and prescription medicines only as told by your health care provider. Try walking around when you have cramps or feel bloated. Contact a health care provider if: You have pain or cramping in your abdomen that gets worse or is not helped with medicine. You have a small amount of bleeding from your rectum for more than 24 hours after your procedure. You have nausea or vomiting. You feel weak or dizzy. You have a fever. Get help right away if: You pass large blood clots or see a large amount of blood in the toilet after  having a bowel movement. You have severe pain in your abdomen. This information is not intended to replace advice given to you by your health care provider. Make sure you discuss any questions you have with your health care provider. Document Revised: 06/13/2021 Document Reviewed: 06/13/2021 Elsevier Patient Education  [place] After The following information offers guidance on how to care for yourself after your procedure. Your health care provider may also give you more specific instructions. If you have problems or questions, contact your health care provider. What can I expect after the procedure? After the procedure, it is common to have: Tiredness. Little or no memory about what happened during or after the procedure. Impaired judgment when it comes to making decisions. Nausea or vomiting. Some trouble with balance. Follow these instructions at home: For the time period you were told by your health care provider:  Rest. Do not participate in activities where you could fall  or become injured. Do not drive or use machinery. Do not drink alcohol. Do not take sleeping pills or medicines that cause drowsiness. Do not make important decisions or sign legal documents. Do not take care of children on your own. Medicines Take over-the-counter and prescription medicines only as told by your health care provider. If you were prescribed antibiotics, take them as told by your health care provider. Do not stop using the antibiotic even if you start to feel better. Eating and drinking Follow instructions from your health care provider about what you may eat and drink. Drink enough fluid to keep your urine pale yellow. If you vomit: Drink clear fluids slowly and in small amounts as you are able. Clear fluids include water, ice chips, low-calorie sports drinks, and fruit juice that has water added to it (diluted fruit juice). Eat light and bland foods  in small amounts as you are able. These foods include bananas, applesauce, rice, lean meats, toast, and crackers. General instructions  Have a responsible adult stay with you for the time you are told. It is important to have someone help care for you until you are awake and alert. If you have sleep apnea, surgery and some medicines can increase your risk for breathing problems. Follow instructions from your health care provider about wearing your sleep device: When you are sleeping. This includes during daytime naps. While taking prescription pain medicines, sleeping medicines, or medicines that make you drowsy. Do not use any products that contain nicotine or tobacco. These products include cigarettes, chewing tobacco, and vaping devices, such as e-cigarettes. If you need help quitting, ask your health care provider. Contact a health care provider if: You feel nauseous or vomit every time you eat or drink. You feel light-headed. You are still sleepy or having trouble with balance after 24 hours. You get a rash. You have a fever. You have redness or swelling around the IV site. Get help right away if: You have trouble breathing. You have new confusion after you get home. These symptoms may be an emergency. Get help right away. Call 911. Do not wait to see if the symptoms will go away. Do not drive yourself to the hospital. This information is not intended to replace advice given to you by your health care provider. Make sure you discuss any questions you have with your health care provider. Document Revised: 06/04/2021 Document Reviewed: 06/04/2021 Elsevier Patient Education  [place].

## 2022-04-10 ENCOUNTER — Other Ambulatory Visit: Payer: Self-pay | Admitting: Family Medicine

## 2022-04-10 DIAGNOSIS — R0982 Postnasal drip: Secondary | ICD-10-CM

## 2022-04-11 ENCOUNTER — Encounter (HOSPITAL_COMMUNITY): Payer: Self-pay

## 2022-04-11 ENCOUNTER — Encounter (HOSPITAL_COMMUNITY)
Admission: RE | Admit: 2022-04-11 | Discharge: 2022-04-11 | Disposition: A | Discharge status: Home / Self Care | Payer: 59 | Source: Ambulatory Visit | Attending: Internal Medicine | Admitting: Internal Medicine

## 2022-04-11 VITALS — BP 154/68 | HR 92 | Temp 97.8°F | Resp 18 | Ht 68.0 in | Wt >= 6400 oz

## 2022-04-11 DIAGNOSIS — I10 Essential (primary) hypertension: Secondary | ICD-10-CM | POA: Diagnosis not present

## 2022-04-11 DIAGNOSIS — Z01810 Encounter for preprocedural cardiovascular examination: Secondary | ICD-10-CM | POA: Insufficient documentation

## 2022-04-11 HISTORY — DX: Scar conditions and fibrosis of skin: L90.5

## 2022-04-15 ENCOUNTER — Other Ambulatory Visit: Payer: Self-pay

## 2022-04-15 ENCOUNTER — Ambulatory Visit (HOSPITAL_COMMUNITY)
Admission: RE | Admit: 2022-04-15 | Discharge: 2022-04-15 | Disposition: A | Discharge status: Home / Self Care | Payer: 59 | Source: Ambulatory Visit | Attending: Internal Medicine | Admitting: Internal Medicine

## 2022-04-15 ENCOUNTER — Ambulatory Visit (HOSPITAL_COMMUNITY): Payer: 59 | Admitting: Certified Registered Nurse Anesthetist

## 2022-04-15 ENCOUNTER — Encounter (HOSPITAL_COMMUNITY)
Admission: RE | Disposition: A | Discharge status: Home / Self Care | Payer: Self-pay | Source: Ambulatory Visit | Attending: Internal Medicine

## 2022-04-15 ENCOUNTER — Ambulatory Visit (HOSPITAL_BASED_OUTPATIENT_CLINIC_OR_DEPARTMENT_OTHER): Payer: 59 | Admitting: Certified Registered Nurse Anesthetist

## 2022-04-15 ENCOUNTER — Encounter (HOSPITAL_COMMUNITY): Payer: Self-pay

## 2022-04-15 DIAGNOSIS — K648 Other hemorrhoids: Secondary | ICD-10-CM

## 2022-04-15 DIAGNOSIS — Z79899 Other long term (current) drug therapy: Secondary | ICD-10-CM | POA: Insufficient documentation

## 2022-04-15 DIAGNOSIS — R1013 Epigastric pain: Secondary | ICD-10-CM

## 2022-04-15 DIAGNOSIS — K219 Gastro-esophageal reflux disease without esophagitis: Secondary | ICD-10-CM | POA: Insufficient documentation

## 2022-04-15 DIAGNOSIS — K2970 Gastritis, unspecified, without bleeding: Secondary | ICD-10-CM

## 2022-04-15 DIAGNOSIS — G4730 Sleep apnea, unspecified: Secondary | ICD-10-CM | POA: Insufficient documentation

## 2022-04-15 DIAGNOSIS — K625 Hemorrhage of anus and rectum: Secondary | ICD-10-CM

## 2022-04-15 DIAGNOSIS — E1143 Type 2 diabetes mellitus with diabetic autonomic (poly)neuropathy: Secondary | ICD-10-CM | POA: Diagnosis not present

## 2022-04-15 DIAGNOSIS — D509 Iron deficiency anemia, unspecified: Secondary | ICD-10-CM

## 2022-04-15 DIAGNOSIS — D500 Iron deficiency anemia secondary to blood loss (chronic): Secondary | ICD-10-CM | POA: Diagnosis not present

## 2022-04-15 DIAGNOSIS — Z8616 Personal history of COVID-19: Secondary | ICD-10-CM | POA: Insufficient documentation

## 2022-04-15 DIAGNOSIS — Z8711 Personal history of peptic ulcer disease: Secondary | ICD-10-CM | POA: Diagnosis not present

## 2022-04-15 DIAGNOSIS — F418 Other specified anxiety disorders: Secondary | ICD-10-CM

## 2022-04-15 DIAGNOSIS — I10 Essential (primary) hypertension: Secondary | ICD-10-CM

## 2022-04-15 DIAGNOSIS — Z9981 Dependence on supplemental oxygen: Secondary | ICD-10-CM | POA: Insufficient documentation

## 2022-04-15 DIAGNOSIS — Z6841 Body Mass Index (BMI) 40.0 and over, adult: Secondary | ICD-10-CM | POA: Insufficient documentation

## 2022-04-15 HISTORY — PX: GIVENS CAPSULE STUDY: SHX5432

## 2022-04-15 HISTORY — PX: FLEXIBLE SIGMOIDOSCOPY: SHX5431

## 2022-04-15 HISTORY — PX: ESOPHAGOGASTRODUODENOSCOPY (EGD) WITH PROPOFOL: SHX5813

## 2022-04-15 LAB — GLUCOSE, CAPILLARY: Glucose-Capillary: 107 mg/dL — ABNORMAL HIGH (ref 70–99)

## 2022-04-15 SURGERY — SIGMOIDOSCOPY, FLEXIBLE
Anesthesia: General

## 2022-04-15 MED ORDER — LIDOCAINE VISCOUS HCL 2 % MT SOLN: 15.0000 mL | Freq: Once | OROMUCOSAL | Status: DC

## 2022-04-15 MED ORDER — BUTAMBEN-TETRACAINE-BENZOCAINE 2-2-14 % EX AERO
INHALATION_SPRAY | CUTANEOUS | Status: DC | PRN
  Administered 2022-04-15 (×2): 1 via TOPICAL

## 2022-04-15 MED ORDER — LACTATED RINGERS IV SOLN: INTRAVENOUS | Status: DC

## 2022-04-15 MED ORDER — LIDOCAINE HCL (CARDIAC) PF 100 MG/5ML IV SOSY
PREFILLED_SYRINGE | INTRAVENOUS | Status: DC | PRN
  Administered 2022-04-15: 50 mg via INTRAVENOUS

## 2022-04-15 MED ORDER — LACTATED RINGERS IV SOLN: INTRAVENOUS | Status: DC | PRN

## 2022-04-15 MED ORDER — PROPOFOL 10 MG/ML IV BOLUS
INTRAVENOUS | Status: DC | PRN
  Administered 2022-04-15: 50 mg via INTRAVENOUS
  Administered 2022-04-15: 100 mg via INTRAVENOUS
  Administered 2022-04-15: 20 mg via INTRAVENOUS

## 2022-04-15 MED ORDER — PROPOFOL 500 MG/50ML IV EMUL
INTRAVENOUS | Status: DC | PRN
  Administered 2022-04-15: 150 ug/kg/min via INTRAVENOUS

## 2022-04-15 MED ORDER — BUTAMBEN-TETRACAINE-BENZOCAINE 2-2-14 % EX AERO
INHALATION_SPRAY | CUTANEOUS | Status: AC
  Filled 2022-04-15: qty 5

## 2022-04-15 NOTE — Op Note (Signed)
[facility] Patient Name: Ricardo Lopez Procedure Date: 04/15/2022 10:16 AM MRN: [id number] Date of Birth: 04/07/1975 Attending MD: [name] , [place], [id number] CSN: [id number] Age: 47 Admit Type: Outpatient Procedure:                Upper GI endoscopy Indications:              Iron deficiency anemia Providers:                [name], DO, Crystal Page, Rosina Lowenstein,                            RN Referring MD:              Medicines:                See the Anesthesia note for documentation of the                            administered medications Complications:            No immediate complications. Estimated Blood Loss:     Estimated blood loss: none. Procedure:                Pre-Anesthesia Assessment:                           - The anesthesia plan was to use monitored                            anesthesia care (MAC).                           After obtaining informed consent, the endoscope was                            passed under direct vision. Throughout the                            procedure, the patient's blood pressure, pulse, and                            oxygen saturations were monitored continuously. The                            [id number] scope was introduced through the                            mouth, and advanced to the second part of duodenum.                            The upper GI endoscopy was accomplished without                            difficulty. The patient tolerated the procedure                            well. Scope In: 10:57:48 AM Scope  Out: 11:05:07 AM Total Procedure Duration: 0 hours 7 minutes 19 seconds  Findings:      The examined esophagus was normal.      Patchy minimal inflammation characterized by erythema was found in the       gastric body.      The duodenal bulb, first portion of the duodenum and second portion of       the duodenum were normal.      Attempted to place GIVENS capsule. Unable to  advance capsule through the       oropharynx. Impression:               - Normal esophagus.                           - Gastritis.                           - Normal duodenal bulb, first portion of the                            duodenum and second portion of the duodenum.                           - No specimens collected. Moderate Sedation:      Per Anesthesia Care Recommendation:           - Patient has a contact number available for                            emergencies. The signs and symptoms of potential                            delayed complications were discussed with the                            patient. Return to normal activities tomorrow.                            Written discharge instructions were provided to the                            patient.                           - Resume previous diet.                           - Continue present medications.                           - Will have patient swallow GIVENS capsule post                            procedure. Procedure Code(s):        --- Professional ---                           [phone], Esophagogastroduodenoscopy, flexible,  transoral; diagnostic, including collection of                            specimen(s) by brushing or washing, when performed                            (separate procedure) Diagnosis Code(s):        --- Professional ---                           K29.70, Gastritis, unspecified, without bleeding                           D50.9, Iron deficiency anemia, unspecified CPT copyright 2022 American Medical Association. All rights reserved. The codes documented in this report are preliminary and upon coder review may  be revised to meet current compliance requirements. [name], DO [name], DO 04/15/2022 11:08:49 AM This report has been signed electronically. Number of Addenda: 0

## 2022-04-15 NOTE — Anesthesia Preprocedure Evaluation (Addendum)
Anesthesia Evaluation  Patient identified by MRN, date of birth, ID band Patient awake    Reviewed: Allergy & Precautions, H&P , NPO status , Patient's Chart, lab work & pertinent test results, reviewed documented beta blocker date and time   History of Anesthesia Complications Negative for: history of anesthetic complications  Airway Mallampati: II  TM Distance: >3 FB Neck ROM: Full    Dental  (+) Dental Advisory Given, Edentulous Upper, Missing   Pulmonary shortness of breath and Long-Term Oxygen Therapy, sleep apnea and Oxygen sleep apnea , pneumonia   Pulmonary exam normal breath sounds clear to auscultation       Cardiovascular Exercise Tolerance: Poor hypertension, Pt. on medications and Pt. on home beta blockers Normal cardiovascular exam Rhythm:Regular Rate:Normal     Neuro/Psych  Headaches PSYCHIATRIC DISORDERS Anxiety Depression       GI/Hepatic Neg liver ROS, PUD,GERD  Medicated,,  Endo/Other  diabetes (GLP - 1 drugs), Well Controlled, Type 2, Oral Hypoglycemic Agents  Morbid obesity  Renal/GU Renal InsufficiencyRenal disease  negative genitourinary   Musculoskeletal negative musculoskeletal ROS (+)    Abdominal   Peds negative pediatric ROS (+)  Hematology  (+) Blood dyscrasia, anemia   Anesthesia Other Findings Susy Frizzle, MD 11/08/2021  7:02 AM EDT   1. Previously described nodule of the right lung base is gone No further follow-up required. 2. Unchanged scarring throughout the lungs due to prior COVID 3. No significant change in an aneurysm of the ascending thoracic aorta, measuring up to 4.4 x 4.4 cm,  Recommend annual imaging    Reproductive/Obstetrics negative OB ROS                             Anesthesia Physical Anesthesia Plan  ASA: 4  Anesthesia Plan: General   Post-op Pain Management: Minimal or no pain anticipated   Induction: Intravenous  PONV  Risk Score and Plan: 1 and Propofol infusion  Airway Management Planned: Nasal Cannula, Natural Airway and Simple Face Mask  Additional Equipment:   Intra-op Plan:   Post-operative Plan:   Informed Consent: I have reviewed the patients History and Physical, chart, labs and discussed the procedure including the risks, benefits and alternatives for the proposed anesthesia with the patient or authorized representative who has indicated his/her understanding and acceptance.     Dental advisory given  Plan Discussed with: CRNA and Surgeon  Anesthesia Plan Comments:         Anesthesia Quick Evaluation

## 2022-04-15 NOTE — Anesthesia Postprocedure Evaluation (Signed)
Anesthesia Post Note  Patient: Ricardo Lopez.  Procedure(s) Performed: FLEXIBLE SIGMOIDOSCOPY ESOPHAGOGASTRODUODENOSCOPY (EGD) WITH PROPOFOL GIVENS CAPSULE STUDY  Patient location during evaluation: Phase II Anesthesia Type: General Level of consciousness: awake and alert and oriented Pain management: pain level controlled Vital Signs Assessment: post-procedure vital signs reviewed and stable Respiratory status: spontaneous breathing, nonlabored ventilation and respiratory function stable Cardiovascular status: blood pressure returned to baseline and stable Postop Assessment: no apparent nausea or vomiting Anesthetic complications: no  No notable events documented.   Last Vitals:  Vitals:   04/15/22 1130 04/15/22 1133  BP:  135/62  Pulse: 72 74  Resp: (!) 22 20  Temp:    SpO2: 100% 100%    Last Pain:  Vitals:   04/15/22 1050  TempSrc:   PainSc: 0-No pain                 Turkessa Ostrom C Briya Lookabaugh

## 2022-04-15 NOTE — Transfer of Care (Signed)
Immediate Anesthesia Transfer of Care Note  Patient: Keefer Schutt.  Procedure(s) Performed: FLEXIBLE SIGMOIDOSCOPY ESOPHAGOGASTRODUODENOSCOPY (EGD) WITH PROPOFOL GIVENS CAPSULE STUDY  Patient Location: PACU  Anesthesia Type:General  Level of Consciousness: awake, alert , and oriented  Airway & Oxygen Therapy: Patient Spontanous Breathing and Patient connected to nasal cannula oxygen  Post-op Assessment: Report given to RN and Post -op Vital signs reviewed and stable  Post vital signs: Reviewed and stable  Last Vitals:  Vitals Value Taken Time  BP 122/60   Temp 98.3   Pulse 78 04/15/22 1120  Resp 18 04/15/22 1120  SpO2 99 % 04/15/22 1120  Vitals shown include unvalidated device data.  Last Pain:  Vitals:   04/15/22 1050  TempSrc:   PainSc: 0-No pain      Patients Stated Pain Goal: 7 ([id number])  Complications: No notable events documented.

## 2022-04-15 NOTE — Op Note (Signed)
[facility] Patient Name: Ricardo Lopez Procedure Date: 04/15/2022 10:15 AM MRN: [id number] Date of Birth: Aug 06, 1975 Attending MD: Elon Alas. Edgar Frisk, [id number] CSN: [id number] Age: 47 Admit Type: Outpatient Procedure:                Flexible Sigmoidoscopy Indications:              Rectal hemorrhage, Iron deficiency anemia Providers:                [name], DO, Crystal Page, Rosina Lowenstein,                            RN Referring MD:              Medicines:                See the Anesthesia note for documentation of the                            administered medications Complications:            No immediate complications. Estimated Blood Loss:     Estimated blood loss: none. Procedure:                Pre-Anesthesia Assessment:                           - The anesthesia plan was to use monitored                            anesthesia care (MAC).                           After obtaining informed consent, the scope was                            passed under direct vision. The [id number]                            [id number]) scope was introduced through the anus and                            advanced to the the descending colon. The flexible                            sigmoidoscopy was accomplished without difficulty.                            The patient tolerated the procedure well. The                            quality of the bowel preparation was excellent. Scope In: 11:09:29 AM Scope Out: 11:14:38 AM Total Procedure Duration: 0 hours 5 minutes 9 seconds  Findings:      Non-bleeding internal hemorrhoids were found during retroflexion.      The exam was otherwise without abnormality. Impression:               - Non-bleeding internal hemorrhoids.                           -  The examination was otherwise normal.                           - No specimens collected. Moderate Sedation:      Per Anesthesia Care Recommendation:           - Patient has a contact  number available for                            emergencies. The signs and symptoms of potential                            delayed complications were discussed with the                            patient. Return to normal activities tomorrow.                            Written discharge instructions were provided to the                            patient.                           - Resume previous diet.                           - Continue present medications.                           - Return to GI clinic in 3 months.                           - Consider referral to colorectal surgery as                            patient has not responded to conservative                            measures/hemorrhoid banding. Procedure Code(s):        --- Professional ---                           [phone], Sigmoidoscopy, flexible; diagnostic,                            including collection of specimen(s) by brushing or                            washing, when performed (separate procedure) Diagnosis Code(s):        --- Professional ---                           K64.8, Other hemorrhoids                           K62.5, Hemorrhage of anus and rectum  D50.9, Iron deficiency anemia, unspecified CPT copyright 2022 American Medical Association. All rights reserved. The codes documented in this report are preliminary and upon coder review may  be revised to meet current compliance requirements. [name], DO [place] Abbey Chatters, DO 04/15/2022 11:19:02 AM This report has been signed electronically. Number of Addenda: 0

## 2022-04-15 NOTE — Interval H&P Note (Signed)
History and Physical Interval Note:  04/15/2022 10:05 AM  Ricardo Lopez.  has presented today for surgery, with the diagnosis of ANEMIA, rectal bleeding, epigastric pain, gerd.  The various methods of treatment have been discussed with the patient and family. After consideration of risks, benefits and other options for treatment, the patient has consented to  Procedure(s) with comments: FLEXIBLE SIGMOIDOSCOPY (N/A) - 100pm, asa 3 ESOPHAGOGASTRODUODENOSCOPY (EGD) WITH PROPOFOL (N/A) GIVENS CAPSULE STUDY (N/A) as a surgical intervention.  The patient's history has been reviewed, patient examined, no change in status, stable for surgery.  I have reviewed the patient's chart and labs.  Questions were answered to the patient's satisfaction.     Eloise Harman

## 2022-04-15 NOTE — Discharge Instructions (Signed)
EGD Discharge instructions Please read the instructions outlined below and refer to this sheet in the next few weeks. These discharge instructions provide you with general information on caring for yourself after you leave the hospital. Your doctor may also give you specific instructions. While your treatment has been planned according to the most current medical practices available, unavoidable complications occasionally occur. If you have any problems or questions after discharge, please call your doctor. ACTIVITY You may resume your regular activity but move at a slower pace for the next 24 hours.  Take frequent rest periods for the next 24 hours.  Walking will help expel (get rid of) the air and reduce the bloated feeling in your abdomen.  No driving for 24 hours (because of the anesthesia (medicine) used during the test).  You may shower.  Do not sign any important legal documents or operate any machinery for 24 hours (because of the anesthesia used during the test).  NUTRITION Drink plenty of fluids.  You may resume your normal diet.  Begin with a light meal and progress to your normal diet.  Avoid alcoholic beverages for 24 hours or as instructed by your caregiver.  MEDICATIONS You may resume your normal medications unless your caregiver tells you otherwise.  WHAT YOU CAN EXPECT TODAY You may experience abdominal discomfort such as a feeling of fullness or "gas" pains.  FOLLOW-UP Your doctor will discuss the results of your test with you.  SEEK IMMEDIATE MEDICAL ATTENTION IF ANY OF THE FOLLOWING OCCUR: Excessive nausea (feeling sick to your stomach) and/or vomiting.  Severe abdominal pain and distention (swelling).  Trouble swallowing.  Temperature over 101 F (37.8 C).  Rectal bleeding or vomiting of blood.    Flexible Sigmoidoscopy Discharge Instructions  Read the instructions outlined below and refer to this sheet in the next few weeks. These discharge instructions provide  you with general information on caring for yourself after you leave the hospital. Your doctor may also give you specific instructions. While your treatment has been planned according to the most current medical practices available, unavoidable complications occasionally occur.   ACTIVITY You may resume your regular activity, but move at a slower pace for the next 24 hours.  Take frequent rest periods for the next 24 hours.  Walking will help get rid of the air and reduce the bloated feeling in your belly (abdomen).  No driving for 24 hours (because of the medicine (anesthesia) used during the test).   Do not sign any important legal documents or operate any machinery for 24 hours (because of the anesthesia used during the test).  NUTRITION Drink plenty of fluids.  You may resume your normal diet as instructed by your doctor.  Begin with a light meal and progress to your normal diet. Heavy or fried foods are harder to digest and may make you feel sick to your stomach (nauseated).  Avoid alcoholic beverages for 24 hours or as instructed.  MEDICATIONS You may resume your normal medications unless your doctor tells you otherwise.  WHAT YOU CAN EXPECT TODAY Some feelings of bloating in the abdomen.  Passage of more gas than usual.  Spotting of blood in your stool or on the toilet paper.   FINDING OUT THE RESULTS OF YOUR TEST Not all test results are available during your visit. If your test results are not back during the visit, make an appointment with your caregiver to find out the results. Do not assume everything is normal if you have not heard  from your caregiver or the medical facility. It is important for you to follow up on all of your test results.  SEEK IMMEDIATE MEDICAL ATTENTION IF: You have more than a spotting of blood in your stool.  Your belly is swollen (abdominal distention).  You are nauseated or vomiting.  You have a temperature over 101.  You have abdominal pain or  discomfort that is severe or gets worse throughout the day.   Overall, your upper GI tract looked healthy.  Mild amount inflammation in your stomach.  Esophagus small bowel appeared normal.  I was unable to advance the capsule through your oral cavity.  We will have you swallow this send postop and call with results.  Overall your flexible sigmoidoscopy was unremarkable besides internal hemorrhoids. Remainder of exam was normal.  You may benefit from referral to colorectal surgeon in [place] to discuss further treatment options.  Otherwise follow-up in GI office in 2 to 3 months.  I hope you have a great rest of your week!  Ricardo Lopez. Ricardo Lopez, D.O. Gastroenterology and Hepatology [facility]

## 2022-04-18 ENCOUNTER — Other Ambulatory Visit: Payer: Self-pay | Admitting: Family Medicine

## 2022-04-18 ENCOUNTER — Other Ambulatory Visit (HOSPITAL_COMMUNITY): Payer: Self-pay

## 2022-04-18 ENCOUNTER — Other Ambulatory Visit: Payer: Self-pay | Admitting: Cardiology

## 2022-04-19 ENCOUNTER — Other Ambulatory Visit: Payer: Self-pay | Admitting: Family Medicine

## 2022-04-19 ENCOUNTER — Other Ambulatory Visit (HOSPITAL_COMMUNITY): Payer: Self-pay

## 2022-04-19 MED ORDER — OXYCODONE HCL 10 MG PO TABS
10.0000 mg | ORAL_TABLET | Freq: Three times a day (TID) | ORAL | 0 refills | Status: DC | PRN
  Filled 2022-04-19 – 2022-04-29 (×3): qty 180, 30d supply, fill #0

## 2022-04-21 ENCOUNTER — Encounter: Payer: Self-pay | Admitting: Family Medicine

## 2022-04-22 ENCOUNTER — Other Ambulatory Visit: Payer: Self-pay | Admitting: Family Medicine

## 2022-04-22 MED ORDER — TIRZEPATIDE 12.5 MG/0.5ML ~~LOC~~ SOAJ: 12.5000 mg | SUBCUTANEOUS | 3 refills | Status: DC

## 2022-04-22 NOTE — Progress Notes (Unsigned)
[place], [place] [address]   CLINIC:  Medical Oncology/Hematology  PCP:  Susy Frizzle, MD [address] [place] [address] [phone]   REASON FOR VISIT:  Follow-up for microcytic anemia  CURRENT THERAPY: Under workup  INTERVAL HISTORY:   Mr. Matias 47 y.o. male returns for routine follow-up of microcytic anemia.  He was seen for initial consultation by Dr. Delton Coombes on 04/02/2022.  At today's visit, he reports doing well overall ***.  He denies any changes in his symptoms or baseline health status since he saw Dr. Delton Coombes 3 weeks ago.  *** *** He continues to report regular hematochezia, and recent flexible sigmoidoscopy shows persistent internal hemorrhoids. *** He continues to take iron tablet daily. *** He reports ice pica. *** Fatigue??  He has ***% energy and ***% appetite. He has lost 100 pounds in the past 4-5 months while on Mounjaro.  ASSESSMENT & PLAN:  1.   Microcytic anemia: - He reports that he has been anemic all his life.  No prior history of blood transfusion. - Takes iron tablet daily for the last 3 years.  Positive for ice pica. - Personal history of sickle cell trait. - Reports on and off rectal bleeding for the past few years.  He reports history of banding for internal hemorrhoids. - Colonoscopy (05/17/2021): Internal hemorrhoids. - EGD (04/15/2022): Gastritis, otherwise normal esophagus and duodenum. - Flexible sigmoidoscopy (04/15/2022): Internal hemorrhoids.  He is being considered for referral to colorectal surgery as he has not responded to conservative measures / hemorrhoid banding. - *** - We have reviewed recent labs from 03/22/2022.  Hemoglobin is 10 with MCV of 68.4.  Ferritin is 153% saturation 17. - He has microcytosis, likely from sickle cell trait. - He has mildly elevated creatinine since December 2023.  Also has ongoing rectal bleeding. - Will check for coexisting nutritional  deficiencies including 123456, folic acid, MMA, copper.  Will check for hemolysis with LDH, reticulocyte count and direct Coombs test. - Will check hemoglobin electrophoresis to confirm sickle cell trait versus compound sickle cell syndromes. - Will check serum Cystatin C to see if he has CKD. - RTC 2 to 3 weeks for follow-up. - PLAN: ***  2.  Social/family history: - He is currently on disability since long COVID (initial diagnosis 09/28/2019), which left him on continuous oxygen via nasal cannula.  He worked as a Systems developer prior to disability.  Non-smoker. - Mother has anemia.  Maternal grandmother had sickle cell trait.  Son has sickle cell trait.  Maternal grandfather had lung cancer.    PLAN SUMMARY: >> *** >> *** >> ***   [facility] [place] **   You were seen today by Tarri Abernethy PA-C for your ***.    *** ***  *** ***  LABS: Return in ***   OTHER TESTS: ***  MEDICATIONS: ***  FOLLOW-UP APPOINTMENT: ***     REVIEW OF SYSTEMS: ***  Review of Systems - Oncology   PHYSICAL EXAM:  ECOG PERFORMANCE STATUS: {CHL ONC ECOG [id number] *** There were no vitals filed for this visit. There were no vitals filed for this visit. Physical Exam  PAST MEDICAL/SURGICAL HISTORY:  Past Medical History:  Diagnosis Date   Anemia    Anxiety and depression    B12 deficiency    Back pain    COVID    9/21- hypoxia requiring oxygen  Diabetes mellitus without complication (HCC)    Frequency of urination    GERD (gastroesophageal reflux disease)    Gluteal abscess    ecoli after covid hospitalization 2021   Headache    Hypertension    Obesity    Scar tissue    Scar Tissue on lungs due to Covid   Sleep apnea    Stomach ulcer    Tachycardia    Thoracic aortic aneurysm [facility])    Past Surgical History:  Procedure Laterality Date   BIOPSY  05/17/2021   Procedure: BIOPSY;  Surgeon: Milus Banister, MD;  Location: [facility];  Service: Gastroenterology;;  EGD and COLON   COLONOSCOPY WITH PROPOFOL N/A 05/17/2021   Surgeon: Milus Banister, MD; mild erythema and granularity of the colonic mucosa with benign biopsies, internal hemorrhoids suspected to be etiology of rectal bleeding.   ESOPHAGOGASTRODUODENOSCOPY (EGD) WITH PROPOFOL N/A 05/17/2021   Surgeon: Milus Banister, MD; very mild, nonspecific distal gastritis s/p biopsied, otherwise normal exam.  Gastric biopsy was benign, negative for H. pylori.   GIVENS CAPSULE STUDY N/A 11/16/2021   Surgeon: Eloise Harman, DO; poor and only able to identify first gastric image.    SOCIAL HISTORY:  Social History   Socioeconomic History   Marital status: Married    Spouse name: Not on file   Number of children: Not on file   Years of education: Not on file   Highest education level: Not on file  Occupational History   Occupation: stay at home spouse  Tobacco Use   Smoking status: Never   Smokeless tobacco: Never  Vaping Use   Vaping Use: Never used  Substance and Sexual Activity   Alcohol use: Not Currently    Comment: occasional   Drug use: Not Currently    Types: Marijuana    Comment: occasionally   Sexual activity: Yes  Other Topics Concern   Not on file  Social History Narrative   Lives at home with wife and two children.  Four children in all.     Social Determinants of Health   Financial Resource Strain: Not on file  Food Insecurity: No Food Insecurity (04/02/2022)   Hunger Vital Sign    Worried About Running Out of Food in the Last Year: Never true    Ran Out of Food in the Last Year: Never true  Transportation Needs: No Transportation Needs (04/02/2022)   PRAPARE - Hydrologist (Medical): No    Lack of Transportation (Non-Medical): No  Physical Activity: Not on file  Stress: Not on file  Social Connections: Not on file  Intimate Partner Violence: Not At Risk (04/02/2022)    Humiliation, Afraid, Rape, and Kick questionnaire    Fear of Current or Ex-Partner: No    Emotionally Abused: No    Physically Abused: No    Sexually Abused: No    FAMILY HISTORY:  Family History  Problem Relation Age of Onset   Diabetes Mother    Hypertension Mother    Hyperlipidemia Mother    Thyroid disease Mother    Diabetes Father    Hyperlipidemia Father    Hypertension Father    Depression Father    Anxiety disorder Father    Sleep apnea Father    Colon cancer Neg Hx    Stomach cancer Neg Hx     CURRENT MEDICATIONS:  Outpatient Encounter Medications as of 04/23/2022  Medication Sig Note   acetaminophen (TYLENOL) 325 MG tablet  Take 650 mg by mouth every 6 (six) hours as needed for moderate pain.    albuterol (VENTOLIN HFA) 108 (90 Base) MCG/ACT inhaler Inhale 2 puffs into the lungs every 4 (four) hours as needed for wheezing or shortness of breath.    ascorbic acid (VITAMIN C) 500 MG tablet Take 1 tablet (500 mg total) by mouth daily.    atorvastatin (LIPITOR) 10 MG tablet Take 1 tablet (10 mg total) by mouth daily.    Blood Glucose Monitoring Suppl (ONE TOUCH ULTRA 2) w/Device KIT 1 each by Does not apply route 3 (three) times daily.    buPROPion (WELLBUTRIN XL) 150 MG 24 hr tablet TAKE 1 TABLET(150 MG) BY MOUTH DAILY    cetirizine (ZYRTEC) 10 MG tablet TAKE 1 TABLET BY MOUTH DAILY    Cholecalciferol (VITAMIN D) 50 MCG (2000 UT) tablet Take 2,000 Units by mouth daily.    ciclopirox (PENLAC) 8 % solution Apply topically at bedtime. Apply over nail and surrounding skin. Apply daily over previous coat. After seven (7) days, may remove with alcohol and continue cycle.    clotrimazole-betamethasone (LOTRISONE) cream APPLY TOPICALLY TO THE AFFECTED AREA TWICE DAILY (Patient taking differently: Apply 1 Application topically 2 (two) times daily as needed (irritation).)    dexlansoprazole (DEXILANT) 60 MG capsule Take 1 capsule (60 mg total) by mouth daily.    ferrous sulfate  (FEROSUL) 325 (65 FE) MG tablet TAKE 1 TABLET BY MOUTH EVERY MORNING    fluticasone (FLONASE) 50 MCG/ACT nasal spray Place 2 sprays into both nostrils daily as needed for allergies.    furosemide (LASIX) 80 MG tablet Take 1 tablet (80 mg total) by mouth 2 (two) times daily. (Patient not taking: Reported on 04/05/2022)    glucose blood (ONETOUCH ULTRA) test strip 1 each by Other route 3 (three) times daily. Use to check blood sugar three times daily as instructed    hydrocortisone (ANUSOL-HC) 2.5 % rectal cream Place 1 Application rectally 2 (two) times daily. (Patient taking differently: Place 1 Application rectally 2 (two) times daily as needed for anal itching or hemorrhoids.)    Lancets MISC Check fbs daily    metoprolol succinate (TOPROL-XL) 100 MG 24 hr tablet TAKE 1 TABLET(100 MG) BY MOUTH DAILY    Multiple Vitamin (MULTIVITAMIN) tablet Take 1 tablet by mouth daily.    ofloxacin (OCUFLOX) 0.3 % ophthalmic solution Place 1 drop into eye(s) 4 (four) times daily as needed for eye infection.    Oxycodone HCl 10 MG TABS Take 1-2 tablets (10-20 mg total) by mouth 3 (three) times daily as needed.    Oxycodone HCl 10 MG TABS Take 1-2 tablets (10-20 mg total) by mouth 3 (three) times daily as needed. (Patient not taking: Reported on 04/05/2022)    Oxycodone HCl 10 MG TABS Take 1-2 tablets (10-20 mg total) by mouth 3 (three) times daily as needed. (Patient not taking: Reported on 04/05/2022)    Oxycodone HCl 10 MG TABS Take 1-2 tablets (10-20 mg total) by mouth 3 (three) times daily as needed. 03/28/22    Oxycodone HCl 10 MG TABS Take 1-2 tablets (10-20 mg total) by mouth 3 (three) times daily as needed.    oxyCODONE-acetaminophen (PERCOCET) 10-325 MG tablet Take 1-2 tablets by mouth 3 (three) times daily as needed. (Patient not taking: Reported on 04/05/2022)    potassium chloride SA (KLOR-CON M) 20 MEQ tablet TAKE 2 TABLETS BY MOUTH DAILY 04/05/2022: On hold until further notice   prazosin (MINIPRESS) 2 MG  capsule  TAKE 1 CAPSULE(2 MG) BY MOUTH AT BEDTIME    sodium chloride (OCEAN) 0.65 % SOLN nasal spray Place 1 spray into both nostrils as needed for congestion.    sucralfate (CARAFATE) 1 g tablet TAKE 1 TABLET WITH MEALS AND AT BEDTIME MAX OF 4/DAY    tirzepatide (MOUNJARO) 12.5 MG/0.5ML Pen Inject 12.5 mg into the skin once a week.    tirzepatide (MOUNJARO) 15 MG/0.5ML Pen Inject 15 mg into the skin once a week.    venlafaxine XR (EFFEXOR-XR) 75 MG 24 hr capsule TAKE 2 CAPSULES(150 MG) BY MOUTH DAILY WITH BREAKFAST    zolpidem (AMBIEN) 10 MG tablet TAKE 1 TABLET(10 MG) BY MOUTH AT BEDTIME AS NEEDED FOR SLEEP    No facility-administered encounter medications on file as of 04/23/2022.    ALLERGIES:  No Known Allergies  LABORATORY DATA:  I have reviewed the labs as listed.  CBC    Component Value Date/Time   WBC 11.4 (H) 03/22/2022 1523   RBC 4.68 04/02/2022 0908   RBC 4.75 03/22/2022 1523   HGB 10.0 (L) 03/22/2022 1523   HGB 10.2 (L) 05/18/2020 1357   HCT 32.5 (L) 03/22/2022 1523   HCT 34.9 (L) 05/18/2020 1357   PLT 266 03/22/2022 1523   MCV 68.4 (L) 03/22/2022 1523   MCV 69 (L) 05/18/2020 1357   MCH 21.1 (L) 03/22/2022 1523   MCHC 30.8 (L) 03/22/2022 1523   RDW 15.3 (H) 03/22/2022 1523   RDW 16.0 (H) 05/18/2020 1357   LYMPHSABS 3,215 03/22/2022 1523   LYMPHSABS 2.0 05/18/2020 1357   MONOABS 1.3 (H) 10/22/2019 0435   EOSABS 171 03/22/2022 1523   EOSABS 0.1 05/18/2020 1357   BASOSABS 91 03/22/2022 1523   BASOSABS 0.0 05/18/2020 1357      Latest Ref Rng & Units 03/22/2022    3:23 PM 01/18/2022   10:47 AM 10/18/2021    4:45 PM  CMP  Glucose 65 - 99 mg/dL 89   126   BUN 7 - 25 mg/dL 14   11   Creatinine 0.60 - 1.29 mg/dL 1.33  1.30  1.11   Sodium 135 - 146 mmol/L 142   142   Potassium 3.5 - 5.3 mmol/L 4.3   4.3   Chloride 98 - 110 mmol/L 103   102   CO2 20 - 32 mmol/L 29   36   Calcium 8.6 - 10.3 mg/dL 9.6   9.6   Total Protein 6.1 - 8.1 g/dL 7.5   7.7   Total Bilirubin  0.2 - 1.2 mg/dL 0.3   0.2   AST 10 - 40 U/L 14   12   ALT 9 - 46 U/L 11   10     DIAGNOSTIC IMAGING:  I have independently reviewed the relevant imaging and discussed with the patient.   WRAP UP:  All questions were answered. The patient knows to call the clinic with any problems, questions or concerns.  Medical decision making: ***  Time spent on visit: I spent *** minutes counseling the patient face to face. The total time spent in the appointment was *** minutes and more than 50% was on counseling.  Harriett Rush, PA-C  ***

## 2022-04-23 ENCOUNTER — Inpatient Hospital Stay: Payer: 59 | Attending: Hematology | Admitting: Physician Assistant

## 2022-04-23 ENCOUNTER — Other Ambulatory Visit: Payer: Self-pay

## 2022-04-23 VITALS — BP 156/84 | HR 100 | Temp 97.8°F | Resp 19 | Ht 68.0 in | Wt >= 6400 oz

## 2022-04-23 DIAGNOSIS — D573 Sickle-cell trait: Secondary | ICD-10-CM

## 2022-04-23 DIAGNOSIS — D500 Iron deficiency anemia secondary to blood loss (chronic): Secondary | ICD-10-CM

## 2022-04-23 DIAGNOSIS — K921 Melena: Secondary | ICD-10-CM | POA: Insufficient documentation

## 2022-04-23 DIAGNOSIS — D509 Iron deficiency anemia, unspecified: Secondary | ICD-10-CM | POA: Insufficient documentation

## 2022-04-23 NOTE — Patient Instructions (Addendum)
[facility] [place] **   You were seen today by Tarri Abernethy PA-C for your anemia.    ANEMIA Your anemia is primarily related to your sickle cell trait, but we need to run additional tests to see if you have any other coexisting hemoglobin abnormality (such as alpha thalassemia trait). Your iron levels remain low despite taking iron tablet daily for the past 3 years.  We will schedule you for IV iron x 1 dose. Continue to take your iron tablet once a day, but take this at lunchtime along with orange juice or vitamin C supplement.  (Do not take it at the same time as your Dexilant, as this makes it more difficult for your body to absorb the iron.)  LABS: Return in 2 months for repeat labs  FOLLOW-UP APPOINTMENT: Office visit in 3 months  ** Thank you for trusting me with your healthcare!  I strive to provide all of my patients with quality care at each visit.  If you receive a survey for this visit, I would be so grateful to you for taking the time to provide feedback.  Thank you in advance!  ~ Shyna Duignan                   Dr. Derek Jack   &   Tarri Abernethy, PA-C   - - - - - - - - - - - - - - - - - -    Thank you for choosing [facility] to provide your oncology and hematology care.  To afford each patient quality time with our provider, please arrive at least 15 minutes before your scheduled appointment time.   If you have a lab appointment with the [facility] please come in thru the Main Entrance and check in at the main information desk.  You need to re-schedule your appointment should you arrive 10 or more minutes late.  We strive to give you quality time with our providers, and arriving late affects you and other patients whose appointments are after yours.  Also, if you no show three or more times for appointments you may be dismissed from the clinic at the  providers discretion.     Again, thank you for choosing [facility].  Our hope is that these requests will decrease the amount of time that you wait before being seen by our physicians.       _____________________________________________________________  Should you have questions after your visit to [facility], please contact our office at [phone] and follow the prompts.  Our office hours are 8:00 a.m. and 4:30 p.m. Monday - Friday.  Please note that voicemails left after 4:00 p.m. may not be returned until the following business day.  We are closed weekends and major holidays.  You do have access to a nurse 24-7, just call the main number to the clinic [phone] and do not press any options, hold on the line and a nurse will answer the phone.    For prescription refill requests, have your pharmacy contact our office and allow 72 hours.     0

## 2022-04-23 NOTE — Addendum Note (Signed)
Addended by: Tarri Abernethy on: 04/23/2022 09:27 AM   Modules accepted: Orders

## 2022-04-25 ENCOUNTER — Encounter (HOSPITAL_COMMUNITY): Payer: Self-pay | Admitting: Internal Medicine

## 2022-04-25 ENCOUNTER — Ambulatory Visit: Payer: Medicare Other

## 2022-04-26 ENCOUNTER — Other Ambulatory Visit (HOSPITAL_COMMUNITY): Payer: Self-pay

## 2022-04-29 ENCOUNTER — Other Ambulatory Visit (HOSPITAL_COMMUNITY): Payer: Self-pay

## 2022-04-29 ENCOUNTER — Other Ambulatory Visit: Payer: Self-pay | Admitting: Family Medicine

## 2022-04-29 ENCOUNTER — Encounter: Payer: Self-pay | Admitting: Family Medicine

## 2022-04-29 ENCOUNTER — Inpatient Hospital Stay: Payer: 59

## 2022-04-29 VITALS — BP 117/71 | HR 85 | Temp 97.7°F | Resp 18

## 2022-04-29 DIAGNOSIS — D509 Iron deficiency anemia, unspecified: Secondary | ICD-10-CM | POA: Diagnosis not present

## 2022-04-29 DIAGNOSIS — D500 Iron deficiency anemia secondary to blood loss (chronic): Secondary | ICD-10-CM

## 2022-04-29 MED ORDER — SODIUM CHLORIDE 0.9 % IV SOLN
510.0000 mg | Freq: Once | INTRAVENOUS | Status: AC
  Administered 2022-04-29: 510 mg via INTRAVENOUS
  Filled 2022-04-29: qty 17

## 2022-04-29 MED ORDER — SODIUM CHLORIDE 0.9 % IV SOLN: Freq: Once | INTRAVENOUS | Status: AC

## 2022-04-29 MED ORDER — ACETAMINOPHEN 325 MG PO TABS
650.0000 mg | ORAL_TABLET | Freq: Once | ORAL | Status: AC
  Administered 2022-04-29: 650 mg via ORAL
  Filled 2022-04-29: qty 2

## 2022-04-29 MED ORDER — TIRZEPATIDE 2.5 MG/0.5ML ~~LOC~~ SOAJ
2.5000 mg | SUBCUTANEOUS | 7 refills | Status: DC
  Filled 2022-06-20: qty 2, 28d supply, fill #0

## 2022-04-29 NOTE — Patient Instructions (Signed)
[facility]  Discharge Instructions: Thank you for choosing [facility] to provide your oncology and hematology care.  If you have a lab appointment with the Cancer Center - please note that after April 8th, 2024, all labs will be drawn in the cancer center.  You do not have to check in or register with the main entrance as you have in the past but will complete your check-in in the cancer center.  Wear comfortable clothing and clothing appropriate for easy access to any Portacath or PICC line.   We strive to give you quality time with your provider. You may need to reschedule your appointment if you arrive late (15 or more minutes).  Arriving late affects you and other patients whose appointments are after yours.  Also, if you miss three or more appointments without notifying the office, you may be dismissed from the clinic at the provider's discretion.      For prescription refill requests, have your pharmacy contact our office and allow 72 hours for refills to be completed.    Today you received the following Feraheme, return as scheduled.   To help prevent nausea and vomiting after your treatment, we encourage you to take your nausea medication as directed.  BELOW ARE SYMPTOMS THAT SHOULD BE REPORTED IMMEDIATELY: *FEVER GREATER THAN 100.4 F (38 C) OR HIGHER *CHILLS OR SWEATING *NAUSEA AND VOMITING THAT IS NOT CONTROLLED WITH YOUR NAUSEA MEDICATION *UNUSUAL SHORTNESS OF BREATH *UNUSUAL BRUISING OR BLEEDING *URINARY PROBLEMS (pain or burning when urinating, or frequent urination) *BOWEL PROBLEMS (unusual diarrhea, constipation, pain near the anus) TENDERNESS IN MOUTH AND THROAT WITH OR WITHOUT PRESENCE OF ULCERS (sore throat, sores in mouth, or a toothache) UNUSUAL RASH, SWELLING OR PAIN  UNUSUAL VAGINAL DISCHARGE OR ITCHING   Items with * indicate a potential emergency and should be followed up as soon as possible or go to the Emergency Department if  any problems should occur.  Please show the CHEMOTHERAPY ALERT CARD or IMMUNOTHERAPY ALERT CARD at check-in to the Emergency Department and triage nurse.  Should you have questions after your visit or need to cancel or reschedule your appointment, please contact [facility] [phone]  and follow the prompts.  Office hours are 8:00 a.m. to 4:30 p.m. Monday - Friday. Please note that voicemails left after 4:00 p.m. may not be returned until the following business day.  We are closed weekends and major holidays. You have access to a nurse at all times for urgent questions. Please call the main number to the clinic [phone] and follow the prompts.  For any non-urgent questions, you may also contact your provider using MyChart. We now offer e-Visits for anyone 18 and older to request care online for non-urgent symptoms. For details visit [email or web address].   Also download the MyChart app! Go to the app store, search "MyChart", open the app, select [facility], and log in with your MyChart username and password.   

## 2022-04-29 NOTE — Progress Notes (Signed)
Patient presents today for iron infusion, patient states he took an allergy pill this morning. Patient tolerated iron infusion with no complaints voiced. Peripheral IV site clean and dry with good blood return noted before and after infusion. Band aid applied. VSS with discharge and left in satisfactory condition with no s/s of distress noted.

## 2022-04-30 ENCOUNTER — Telehealth: Payer: Self-pay | Admitting: Gastroenterology

## 2022-04-30 NOTE — Telephone Encounter (Signed)
Patient needs OV with Dr. Marletta Lor to discuss further as his work-up so far has been unrevealing.   Angelica Chessman, can you schedule patient to see Dr. Marletta Lor on Thursday? Please call to confirm with him.

## 2022-04-30 NOTE — Telephone Encounter (Signed)
Spoke to pt, informed him of results and recommendations. Pt voiced understanding. Pt states his stomach hurts really bad before he has a bowel movement and then when he goes it's nothing but blood and blood clots. He states this is not normal.

## 2022-04-30 NOTE — Op Note (Signed)
Small Bowel Givens Capsule Study Procedure date:  04/15/22  Referring Provider:  Ermalinda Memos, PA-C PCP:  Dr. [name], MD  Indication for procedure: Chronic microcytic anemia, found to have component of iron deficiency in 2021, on oral iron since that time.  Also with rectal bleeding.   EGD and colonoscopy in April 2023 with mild gastritis with benign biopsies, mild erythema and granulation of the colonic mucosa with benign biopsy, internal hemorrhoids. CT enterography December 2023 with no significant bowel abnormalities. Repeat EGD and flex sig 04/15/22 with gastritis and nonbleeding internal hemorrhoids.  Pursuing given's capsule to complete GI workup.   Patient data:  Wt: 195.95 kg Ht: 5'8"   Findings:  Complete to the cecum. Normal stomach and small bowel. No erosions, ulcerations, AMVs, masses, or other lesions appreciated. No blood or melanotic effluent noted.   First Gastric image:  00:00:53 First Duodenal image: 00:00:53 First Cecal image: 04:22:20 Gastric Passage time: 1h 30m Small Bowel Passage time:  2h 30m  Summary & Recommendations: Given's capsule entirely normal. No lesions identified to contribute to his history of iron deficiency anemia or rectal bleeding. Suspect his rectal bleeding is from hemorrhoids which is likely contributing to his chronic iron deficiency anemia.  However, the primary source of his anemia is likely non-GI.  He has established with hematology who states microcytic anemia is primarily from sickle cell trait and have arranged for IV iron infusion.  As patient has lost a significant amount of weight, we could or trying another hemorrhoid banding if patient is agreeable. Would need to discuss with Lewie Loron, NP as well to see if she feels this is appropriate.  Otherwise, could consider referral to general surgery for management of hemorrhoids. Marland Kitchen  He should continue to follow closely with hematology.  Ermalinda Memos, PA-C [facility]

## 2022-04-30 NOTE — Telephone Encounter (Signed)
Please let patient know that his givens capsule was entirely normal.  Suspect his rectal bleeding is from hemorrhoids.  I see that he has established with hematology who feels that his chronic anemia is primarily from sickle cell trait.   Recommend he continue to follow closely with hematology for management of his anemia.  For management of his hemorrhoids, as he has lost a significant amount of weight, we can try another hemorrhoid banding if he is interested.  I would need to discuss with Lewie Loron, NP prior to scheduling for repeat banding if this is desired.  Otherwise, we could consider referral to general surgery.  Aside from these definitive strategies for management, he can continue to use hemorrhoid creams as needed, but these have failed to control his symptoms thus far.

## 2022-05-02 ENCOUNTER — Encounter: Payer: Self-pay | Admitting: Gastroenterology

## 2022-05-02 ENCOUNTER — Ambulatory Visit (INDEPENDENT_AMBULATORY_CARE_PROVIDER_SITE_OTHER): Payer: 59 | Admitting: Gastroenterology

## 2022-05-02 VITALS — BP 101/67 | HR 78 | Temp 97.9°F | Ht 68.0 in | Wt >= 6400 oz

## 2022-05-02 DIAGNOSIS — K219 Gastro-esophageal reflux disease without esophagitis: Secondary | ICD-10-CM

## 2022-05-02 DIAGNOSIS — R1013 Epigastric pain: Secondary | ICD-10-CM | POA: Diagnosis not present

## 2022-05-02 DIAGNOSIS — D649 Anemia, unspecified: Secondary | ICD-10-CM | POA: Diagnosis not present

## 2022-05-02 DIAGNOSIS — K649 Unspecified hemorrhoids: Secondary | ICD-10-CM

## 2022-05-02 DIAGNOSIS — K625 Hemorrhage of anus and rectum: Secondary | ICD-10-CM | POA: Diagnosis not present

## 2022-05-02 MED ORDER — HYDROCORTISONE ACETATE 25 MG RE SUPP
25.0000 mg | Freq: Two times a day (BID) | RECTAL | 1 refills | Status: AC
Start: 2022-05-02 — End: ?

## 2022-05-02 NOTE — Progress Notes (Signed)
Referring Provider: [name], MD Primary Care Physician:  [name], MD Primary GI Physician: Dr. Marletta Lor  Chief Complaint  Patient presents with   Follow-up    ED follow up    HPI:   Ricardo Lopez. is a 47 y.o. male who presents to the clinic today for follow-up visit.  He has a history of chronic microcytic anemia at least since 2019, rectal bleeding, hemorrhoids, GERD, chronic upper abdominal pain.   Prior GI work-up:  EGD/colonoscopy April 2023 by  Dr. Christella Hartigan as patient was not a candidate for procedures at [facility] due to morbid obesity: EGD: very mild, nonspecific distal gastritis s/p biopsied, otherwise normal exam.  Gastric biopsy was benign, negative for H. pylori.   Colonoscopy: Mild erythema and granularity of the colon mucosa, possibly more significant on the left than the right side. Biopsies taken to check for chronic inflammation versus possible prep effect. Internal hemorrhoids. The examination was otherwise normal on direct and retroflexion views.  Colon biopsies were benign.  Overall, suspected rectal bleeding likely secondary to hemorrhoids.    Hemorrhoid banding x 3.  Body habitus complicated hemorrhoid banding resulting in banding only in the neutral position.  Last attempted hemorrhoid banding October 2023, unsuccessful attempt to put a band on sufficient tissue x 2. No real rectal pain.    Givens capsule 11/16/2021: Poor prep, only able to identify first gastric image.   CT enterography 01/18/2022 with no significant bowel abnormalities.  He had a 1.7 cm right external iliac lymph node of uncertain etiology and significance.  Recommended follow-up CT in 3-6 months.  He is on recall for repeat CT.   Flexible sigmoidoscopy 04/15/2022 unremarkable aside from internal hemorrhoids.  EGD 04/15/2022: Gastritis, otherwise normal exam.  Givens capsule 3/25: Entirely normal exam.   He has established with hematology for chronic anemia  who has stated microcytic anemia is primarily from sickle cell trait and have started patient on IV iron infusions.   Today: Continues to have rectal bleeding most every day with 1-2 episodes per day.  Stools are soft/loose with 1 bowel movement per day.  States he knows what to eat to keep his bowels moving.  No constipation.  Prefers not to have another banding and states he will not have surgery.  Not using any hemorrhoid creams routinely.   Continues to have epigastric abdominal pain.  Symptoms are somewhat better than they used to be, but still very frequent.  Can occur before or after meals.  No specific trigger.  Does not seem to be related to bowel movements.  Described more as a burning sensation.  Not as much sharp cramping pain as he used to have.  GERD is fairly well-controlled on Dexilant 60 mg daily.  PPI was changed at last visit from pantoprazole 40 mg twice daily.  He does have intermittent sour burps once a week or less and will take over-the-counter and acid chewables.  Some nausea, but no vomiting.  Took Carafate before meals and at bedtime and did not notice much improvement in his abdominal pain.  He is now only taking as needed.  Taking Carafate prn. Doesn't help much.  Denies NSAIDs.   Past Medical History:  Diagnosis Date   Anemia    Anxiety and depression    B12 deficiency    Back pain    COVID    9/21- hypoxia requiring oxygen   Diabetes mellitus without complication    Frequency of urination  GERD (gastroesophageal reflux disease)    Gluteal abscess    ecoli after covid hospitalization 2021   Headache    Hypertension    Obesity    Scar tissue    Scar Tissue on lungs due to Covid   Sleep apnea    Stomach ulcer    Tachycardia    Thoracic aortic aneurysm     Past Surgical History:  Procedure Laterality Date   BIOPSY  05/17/2021   Procedure: BIOPSY;  Surgeon: Rachael Fee, MD;  Location: [facility];  Service: Gastroenterology;;  EGD and COLON    COLONOSCOPY WITH PROPOFOL N/A 05/17/2021   Surgeon: Rachael Fee, MD; mild erythema and granularity of the colonic mucosa with benign biopsies, internal hemorrhoids suspected to be etiology of rectal bleeding.   ESOPHAGOGASTRODUODENOSCOPY (EGD) WITH PROPOFOL N/A 05/17/2021   Surgeon: Rachael Fee, MD; very mild, nonspecific distal gastritis s/p biopsied, otherwise normal exam.  Gastric biopsy was benign, negative for H. pylori.   ESOPHAGOGASTRODUODENOSCOPY (EGD) WITH PROPOFOL N/A 04/15/2022   Procedure: ESOPHAGOGASTRODUODENOSCOPY (EGD) WITH PROPOFOL;  Surgeon: Lanelle Bal, DO;  Location: [facility];  Service: Endoscopy;  Laterality: N/A;   FLEXIBLE SIGMOIDOSCOPY N/A 04/15/2022   Procedure: FLEXIBLE SIGMOIDOSCOPY;  Surgeon: Lanelle Bal, DO;  Location: [facility];  Service: Endoscopy;  Laterality: N/A;  100pm, asa 3   GIVENS CAPSULE STUDY N/A 11/16/2021   Surgeon: Lanelle Bal, DO; poor and only able to identify first gastric image.   GIVENS CAPSULE STUDY N/A 04/15/2022   Procedure: GIVENS CAPSULE STUDY;  Surgeon: Lanelle Bal, DO;  Location: [facility];  Service: Endoscopy;  Laterality: N/A;    Current Outpatient Medications  Medication Sig Dispense Refill   acetaminophen (TYLENOL) 325 MG tablet Take 650 mg by mouth every 6 (six) hours as needed for moderate pain.     albuterol (VENTOLIN HFA) 108 (90 Base) MCG/ACT inhaler Inhale 2 puffs into the lungs every 4 (four) hours as needed for wheezing or shortness of breath. 18 g 2   ascorbic acid (VITAMIN C) 500 MG tablet Take 1 tablet (500 mg total) by mouth daily. 30 tablet 1   atorvastatin (LIPITOR) 10 MG tablet Take 1 tablet (10 mg total) by mouth daily. 90 tablet 3   Blood Glucose Monitoring Suppl (ONE TOUCH ULTRA 2) w/Device KIT 1 each by Does not apply route 3 (three) times daily. 1 kit 1   buPROPion (WELLBUTRIN XL) 150 MG 24 hr tablet TAKE 1 TABLET(150 MG) BY MOUTH DAILY 30 tablet 5   cetirizine (ZYRTEC)  10 MG tablet TAKE 1 TABLET BY MOUTH DAILY 60 tablet 0   Cholecalciferol (VITAMIN D) 50 MCG (2000 UT) tablet Take 2,000 Units by mouth daily.     ciclopirox (PENLAC) 8 % solution Apply topically at bedtime. Apply over nail and surrounding skin. Apply daily over previous coat. After seven (7) days, may remove with alcohol and continue cycle. 6.6 mL 5   clotrimazole-betamethasone (LOTRISONE) cream APPLY TOPICALLY TO THE AFFECTED AREA TWICE DAILY (Patient taking differently: Apply 1 Application topically 2 (two) times daily as needed (irritation).) 45 g 1   dexlansoprazole (DEXILANT) 60 MG capsule Take 1 capsule (60 mg total) by mouth daily. 30 capsule 3   ferrous sulfate (FEROSUL) 325 (65 FE) MG tablet TAKE 1 TABLET BY MOUTH EVERY MORNING 90 tablet 1   fluticasone (FLONASE) 50 MCG/ACT nasal spray Place 2 sprays into both nostrils daily as needed for allergies. 16 g 3  glucose blood (ONETOUCH ULTRA) test strip 1 each by Other route 3 (three) times daily. Use to check blood sugar three times daily as instructed 100 each 11   hydrocortisone (ANUSOL-HC) 25 MG suppository Place 1 suppository (25 mg total) rectally 2 (two) times daily. 30 suppository 1   Lancets MISC Check fbs daily 100 each 11   metoprolol succinate (TOPROL-XL) 100 MG 24 hr tablet TAKE 1 TABLET(100 MG) BY MOUTH DAILY 30 tablet 0   Multiple Vitamin (MULTIVITAMIN) tablet Take 1 tablet by mouth daily.     ofloxacin (OCUFLOX) 0.3 % ophthalmic solution Place 1 drop into eye(s) 4 (four) times daily as needed for eye infection. 5 mL 0   Oxycodone HCl 10 MG TABS Take 1-2 tablets (10-20 mg total) by mouth 3 (three) times daily as needed. 180 tablet 0   Oxycodone HCl 10 MG TABS Take 1-2 tablets (10-20 mg total) by mouth 3 (three) times daily as needed. 180 tablet 0   Oxycodone HCl 10 MG TABS Take 1-2 tablets (10-20 mg total) by mouth 3 (three) times daily as needed. 180 tablet 0   potassium chloride SA (KLOR-CON M) 20 MEQ tablet TAKE 2 TABLETS BY  MOUTH DAILY 30 tablet 0   prazosin (MINIPRESS) 2 MG capsule TAKE 1 CAPSULE(2 MG) BY MOUTH AT BEDTIME 90 capsule 0   sodium chloride (OCEAN) 0.65 % SOLN nasal spray Place 1 spray into both nostrils as needed for congestion. 88 mL 0   sucralfate (CARAFATE) 1 g tablet TAKE 1 TABLET WITH MEALS AND AT BEDTIME MAX OF 4/DAY (Patient taking differently: Take 1 g by mouth 3 (three) times daily as needed.) 360 tablet 0   tirzepatide (MOUNJARO) 2.5 MG/0.5ML Pen Inject 2.5 mg into the skin once a week. 2 mL 3   venlafaxine XR (EFFEXOR-XR) 75 MG 24 hr capsule TAKE 2 CAPSULES(150 MG) BY MOUTH DAILY WITH BREAKFAST 180 capsule 1   zolpidem (AMBIEN) 10 MG tablet TAKE 1 TABLET(10 MG) BY MOUTH AT BEDTIME AS NEEDED FOR SLEEP 30 tablet 1   furosemide (LASIX) 80 MG tablet Take 1 tablet (80 mg total) by mouth 2 (two) times daily. (Patient not taking: Reported on 04/05/2022) 180 tablet 3   No current facility-administered medications for this visit.    Allergies as of 05/02/2022   (No Known Allergies)    Family History  Problem Relation Age of Onset   Diabetes Mother    Hypertension Mother    Hyperlipidemia Mother    Thyroid disease Mother    Diabetes Father    Hyperlipidemia Father    Hypertension Father    Depression Father    Anxiety disorder Father    Sleep apnea Father    Colon cancer Neg Hx    Stomach cancer Neg Hx     Social History   Socioeconomic History   Marital status: Married    Spouse name: Not on file   Number of children: Not on file   Years of education: Not on file   Highest education level: Not on file  Occupational History   Occupation: stay at home spouse  Tobacco Use   Smoking status: Never   Smokeless tobacco: Never  Vaping Use   Vaping Use: Never used  Substance and Sexual Activity   Alcohol use: Not Currently    Comment: occasional   Drug use: Not Currently    Types: Marijuana    Comment: occasionally   Sexual activity: Yes  Other Topics Concern   Not on file  Social History Narrative   Lives at home with wife and two children.  Four children in all.     Social Determinants of Health   Financial Resource Strain: Not on file  Food Insecurity: No Food Insecurity (04/02/2022)   Hunger Vital Sign    Worried About Running Out of Food in the Last Year: Never true    Ran Out of Food in the Last Year: Never true  Transportation Needs: No Transportation Needs (04/02/2022)   PRAPARE - Administrator, Civil Service (Medical): No    Lack of Transportation (Non-Medical): No  Physical Activity: Not on file  Stress: Not on file  Social Connections: Not on file    Review of Systems: Gen: Denies fever, chills, cold or flulike symptoms, presyncope, syncope. CV: Denies chest pain, palpitations. Resp: Admits to chronic shortness of breath on continuous oxygen.  No cough. GI: See HPI Heme: See HPI  Physical Exam: BP 101/67   Pulse 78   Temp 97.9 F (36.6 C)   Ht 5\' 8"  (1.727 m)   Wt (!) 434 lb (196.9 kg)   BMI 65.99 kg/m  General:   Alert and oriented. No distress noted. Pleasant and cooperative.  Head:  Normocephalic and atraumatic. Eyes:  Conjuctiva clear without scleral icterus. Heart:  S1, S2 present without murmurs appreciated. Lungs:  Clear to auscultation bilaterally. No wheezes, rales, or rhonchi. No distress.  Abdomen:  +BS, soft, non-tender and non-distended. No rebound or guarding. No HSM or masses noted. Msk:  Symmetrical without gross deformities. Normal posture. Extremities:  Without edema. Neurologic:  Alert and  oriented x4 Psych:  Normal mood and affect.    Assessment:  47 year old male with history of morbid obesity, HTN, diabetes, chronic respiratory failure since COVID in 2021 requiring continuous 2 L supplemental O2, chronic microcytic anemia at least in 2019, rectal bleeding, hemorrhoids, GERD, chronic upper abdominal pain, presenting today for follow-up.  Rectal bleeding/hemorrhoids:  Chronic and suspected  to be secondary to hemorrhoids.  He had extensive evaluation with EGD x 2, colonoscopy, flexible sigmoidoscopy, and capsule endoscopy without any other significant findings.  He has undergone hemorrhoid banding x 3, but body habitus complicated banding resulting in banding only in the neutral position with last attempt in October 2023, unsuccessful attempt to put a band on sufficient tissue x 2.  He continues to have rectal bleeding most days with 1-2 episodes per day.  Denies constipation or prolonged toilet time.  Discussed the possibility of considering another banding as he has lost about 100 pounds versus surgery versus retrying supportive measures with suppositories as he has not used anything in quite some time.  Patient states he will not have surgery and prefers not to have another banding.  We will try Anusol suppositories for now.  GERD: Fairly well-controlled after changing pantoprazole 40 mg twice daily to Dexilant 60 mg daily.  Occasional sour burps, but not routinely.  Could have underlying gastroparesis contributing to breakthrough in the setting of diabetes, chronic pain medications, and Mounjaro.  Epigastric abdominal pain: Chronic, intermittent upper abdominal pain.  Reports some improvement since changing pantoprazole to Dexilant, but continues with frequent symptoms.  Reports prior sharp/cramping pain is less frequent, more with a burning sensation.  Carafate not very helpful.  No specific triggers.  Can be present before or after meals.  Does not seem to be related to bowel movements.  Denies NSAIDs.  Recent EGD in March 2024 with gastritis only.  Prior EGD in April 2023  with gastritis as well, biopsies benign.  Recent givens capsule in March as well was entirely normal.  CT enterography December 2023 with no see patient GI abnormality.  Etiology is unclear.  Will plan to arrange HIDA to evaluate for biliary dyskinesia.  Will also arrange gastric emptying study to evaluate for  gastroparesis though I do anticipate that this will be abnormal in the setting of chronic pain medications and Mounjaro.  Nonetheless, would be worthwhile knowing if this is a problem for him as it will contribute to his upper GI symptoms.  Additionally, Greggory KeenMounjaro itself may also be playing a role in his persistent symptoms.  If workup is unrevealing, could consider trial of viscous lidocaine versus trial of dicyclomine.  Anemia:  Chronic microcytic anemia dating back at least to 2019 with hemoglobin in the 9-11 range. Previously with iron 22 (L), saturation 7% (L), ferritin within normal limits in September 2021 and started on oral iron at that time.  He has had extensive GI evaluation (see HPI) including EGD x 2, colonoscopy, flexible sigmoidoscopy, givens capsule, and CT enterography without significant abnormalities.  He does have some frequent rectal bleeding in the setting of hemorrhoids as discussed above which could be contributing.  However, he has recently established with hematology who feels that the primary source of his chronic anemia is from sickle cell trait and may also have alpha thalassemia.  He has started on IV iron infusions.  Most recent hemoglobin stable at 10.0 on 03/22/2022.     Plan:  Anusol suppositories twice daily x 7-10 days, then again as needed.  Requested progress report in 1 week.  If no improvement in rectal bleeding, we will discuss possibility of banding reattempt with Lewie LoronAnna Boone, NP as patient has lost about 100 pounds since his last banding. HIDA Gastric emptying study Continue Dexilant 60 mg daily. Use Pepcid 20 mg or Tums as needed for breakthrough heartburn. 4-6 small meals daily. Continue to follow with hematology for anemia. Further recommendations to follow HIDA and gastric emptying study.   Ermalinda MemosKristen Anysha Frappier, PA-C [facility] 05/02/2022

## 2022-05-02 NOTE — Patient Instructions (Signed)
We will arrange for you to have a HIDA scan at [facility] to further evaluate your gallbladder.  We will also arrange you to have a gastric emptying study to see how quickly your stomach is emptying.  Continue Dexilant 60 mg daily.  You can use over-the-counter Pepcid 20 mg or Tums as needed for breakthrough heartburn.  I recommend that you consume 4-6 small meals daily.  I suspect your rectal bleeding is secondary to hemorrhoids. Use Anusol suppositories twice daily for the next 7-10 days to see if this will improve your rectal bleeding. Please call let me know how you are doing in about 1 week. If you continue with rectal bleeding, I will discuss with Tobi Bastos to determine if we can try another hemorrhoid banding.  Will have further recommendations for you after your HIDA and gastric emptying study.  Ermalinda Memos, PA-C [facility]

## 2022-05-02 NOTE — Progress Notes (Deleted)
Referring Provider: Donita BrooksPickard, Warren T, MD Primary Care Physician:  Donita BrooksPickard, Warren T, MD Primary GI:  Dr. Marletta Lorarver  No chief complaint on file.   HPI:   Ricardo ParkRobert E Wain Jr. is a 47 y.o. male who presents to the clinic today for follow-up visit.  He has a history of chronic microcytic anemia at least since 2019, rectal bleeding, hemorrhoids, GERD, chronic upper abdominal pain.   EGD/colonoscopy April 2023 by  Dr. Christella HartiganJacobs as patient was not a candidate for procedures at [facility] due to morbid obesity: EGD: very mild, nonspecific distal gastritis s/p biopsied, otherwise normal exam.  Gastric biopsy was benign, negative for H. pylori.  Colonoscopy: Mild erythema and granularity of the colon mucosa, possibly more significant on the left than the right side. Biopsies taken to check for chronic inflammation versus possible prep effect. Internal hemorrhoids. The examination was otherwise normal on direct and retroflexion views.  Colon biopsies were benign.  Overall, suspected rectal bleeding likely secondary to hemorrhoids.    He has undergone hemorrhoid banding x 3.  Body habitus complicated hemorrhoid banding resulting in banding only in the neutral position.  Last attempted hemorrhoid banding October 2023, unsuccessful attempt to put a band on sufficient tissue x 2. No real rectal pain.    Givens capsule was completed 11/16/2021, but prep was poor and only able to identify first gastric image.   Follow-up CT enterography 01/18/2022 with no significant bowel abnormalities.  He had a 1.7 cm right external iliac lymph node of uncertain etiology and significance.  Recommended follow-up CT in 3-6 months.  He is on recall for repeat CT.  Flexible sigmoidoscopy 04/15/2022 unremarkable size internal hemorrhoids.   Past Medical History:  Diagnosis Date   Anemia    Anxiety and depression    B12 deficiency    Back pain    COVID    9/21- hypoxia requiring oxygen   Diabetes mellitus without  complication    Frequency of urination    GERD (gastroesophageal reflux disease)    Gluteal abscess    ecoli after covid hospitalization 2021   Headache    Hypertension    Obesity    Scar tissue    Scar Tissue on lungs due to Covid   Sleep apnea    Stomach ulcer    Tachycardia    Thoracic aortic aneurysm     Past Surgical History:  Procedure Laterality Date   BIOPSY  05/17/2021   Procedure: BIOPSY;  Surgeon: Rachael FeeJacobs, Daniel P, MD;  [facility];  Service: Gastroenterology;;  EGD and COLON   COLONOSCOPY WITH PROPOFOL N/A 05/17/2021   Surgeon: Rachael FeeJacobs, Daniel P, MD; mild erythema and granularity of the colonic mucosa with benign biopsies, internal hemorrhoids suspected to be etiology of rectal bleeding.   ESOPHAGOGASTRODUODENOSCOPY (EGD) WITH PROPOFOL N/A 05/17/2021   Surgeon: Rachael FeeJacobs, Daniel P, MD; very mild, nonspecific distal gastritis s/p biopsied, otherwise normal exam.  Gastric biopsy was benign, negative for H. pylori.   ESOPHAGOGASTRODUODENOSCOPY (EGD) WITH PROPOFOL N/A 04/15/2022   Procedure: ESOPHAGOGASTRODUODENOSCOPY (EGD) WITH PROPOFOL;  Surgeon: Lanelle Balarver, Shalva Rozycki K, DO;  [facility];  Service: Endoscopy;  Laterality: N/A;   FLEXIBLE SIGMOIDOSCOPY N/A 04/15/2022   Procedure: FLEXIBLE SIGMOIDOSCOPY;  Surgeon: Lanelle Balarver, Merle Whitehorn K, DO;  [facility];  Service: Endoscopy;  Laterality: N/A;  100pm, asa 3   GIVENS CAPSULE STUDY N/A 11/16/2021   Surgeon: Lanelle Balarver, Pierson Vantol K, DO; poor and only able to identify first gastric image.   GIVENS  CAPSULE STUDY N/A 04/15/2022   Procedure: GIVENS CAPSULE STUDY;  Surgeon: Lanelle Bal, DO;  [facility];  Service: Endoscopy;  Laterality: N/A;    Current Outpatient Medications  Medication Sig Dispense Refill   acetaminophen (TYLENOL) 325 MG tablet Take 650 mg by mouth every 6 (six) hours as needed for moderate pain.     albuterol (VENTOLIN HFA) 108 (90 Base) MCG/ACT inhaler Inhale 2 puffs into the  lungs every 4 (four) hours as needed for wheezing or shortness of breath. 18 g 2   ascorbic acid (VITAMIN C) 500 MG tablet Take 1 tablet (500 mg total) by mouth daily. 30 tablet 1   atorvastatin (LIPITOR) 10 MG tablet Take 1 tablet (10 mg total) by mouth daily. 90 tablet 3   Blood Glucose Monitoring Suppl (ONE TOUCH ULTRA 2) w/Device KIT 1 each by Does not apply route 3 (three) times daily. 1 kit 1   buPROPion (WELLBUTRIN XL) 150 MG 24 hr tablet TAKE 1 TABLET(150 MG) BY MOUTH DAILY 30 tablet 5   cetirizine (ZYRTEC) 10 MG tablet TAKE 1 TABLET BY MOUTH DAILY 60 tablet 0   Cholecalciferol (VITAMIN D) 50 MCG (2000 UT) tablet Take 2,000 Units by mouth daily.     ciclopirox (PENLAC) 8 % solution Apply topically at bedtime. Apply over nail and surrounding skin. Apply daily over previous coat. After seven (7) days, may remove with alcohol and continue cycle. 6.6 mL 5   clotrimazole-betamethasone (LOTRISONE) cream APPLY TOPICALLY TO THE AFFECTED AREA TWICE DAILY (Patient taking differently: Apply 1 Application topically 2 (two) times daily as needed (irritation).) 45 g 1   dexlansoprazole (DEXILANT) 60 MG capsule Take 1 capsule (60 mg total) by mouth daily. 30 capsule 3   ferrous sulfate (FEROSUL) 325 (65 FE) MG tablet TAKE 1 TABLET BY MOUTH EVERY MORNING 90 tablet 1   fluticasone (FLONASE) 50 MCG/ACT nasal spray Place 2 sprays into both nostrils daily as needed for allergies. 16 g 3   furosemide (LASIX) 80 MG tablet Take 1 tablet (80 mg total) by mouth 2 (two) times daily. (Patient not taking: Reported on 04/05/2022) 180 tablet 3   glucose blood (ONETOUCH ULTRA) test strip 1 each by Other route 3 (three) times daily. Use to check blood sugar three times daily as instructed 100 each 11   hydrocortisone (ANUSOL-HC) 2.5 % rectal cream Place 1 Application rectally 2 (two) times daily. (Patient taking differently: Place 1 Application rectally 2 (two) times daily as needed for anal itching or hemorrhoids.) 30 g 1    Lancets MISC Check fbs daily 100 each 11   metoprolol succinate (TOPROL-XL) 100 MG 24 hr tablet TAKE 1 TABLET(100 MG) BY MOUTH DAILY 30 tablet 0   Multiple Vitamin (MULTIVITAMIN) tablet Take 1 tablet by mouth daily.     ofloxacin (OCUFLOX) 0.3 % ophthalmic solution Place 1 drop into eye(s) 4 (four) times daily as needed for eye infection. 5 mL 0   Oxycodone HCl 10 MG TABS Take 1-2 tablets (10-20 mg total) by mouth 3 (three) times daily as needed. 180 tablet 0   Oxycodone HCl 10 MG TABS Take 1-2 tablets (10-20 mg total) by mouth 3 (three) times daily as needed. 180 tablet 0   Oxycodone HCl 10 MG TABS Take 1-2 tablets (10-20 mg total) by mouth 3 (three) times daily as needed. 180 tablet 0   potassium chloride SA (KLOR-CON M) 20 MEQ tablet TAKE 2 TABLETS BY MOUTH DAILY 30 tablet 0   prazosin (MINIPRESS)  2 MG capsule TAKE 1 CAPSULE(2 MG) BY MOUTH AT BEDTIME 90 capsule 0   sodium chloride (OCEAN) 0.65 % SOLN nasal spray Place 1 spray into both nostrils as needed for congestion. 88 mL 0   sucralfate (CARAFATE) 1 g tablet TAKE 1 TABLET WITH MEALS AND AT BEDTIME MAX OF 4/DAY 360 tablet 0   tirzepatide (MOUNJARO) 2.5 MG/0.5ML Pen Inject 2.5 mg into the skin once a week. 2 mL 3   venlafaxine XR (EFFEXOR-XR) 75 MG 24 hr capsule TAKE 2 CAPSULES(150 MG) BY MOUTH DAILY WITH BREAKFAST 180 capsule 1   zolpidem (AMBIEN) 10 MG tablet TAKE 1 TABLET(10 MG) BY MOUTH AT BEDTIME AS NEEDED FOR SLEEP 30 tablet 1   No current facility-administered medications for this visit.    Allergies as of 05/02/2022   (No Known Allergies)    Family History  Problem Relation Age of Onset   Diabetes Mother    Hypertension Mother    Hyperlipidemia Mother    Thyroid disease Mother    Diabetes Father    Hyperlipidemia Father    Hypertension Father    Depression Father    Anxiety disorder Father    Sleep apnea Father    Colon cancer Neg Hx    Stomach cancer Neg Hx     Social History   Socioeconomic History   Marital  status: Married    Spouse name: Not on file   Number of children: Not on file   Years of education: Not on file   Highest education level: Not on file  Occupational History   Occupation: stay at home spouse  Tobacco Use   Smoking status: Never   Smokeless tobacco: Never  Vaping Use   Vaping Use: Never used  Substance and Sexual Activity   Alcohol use: Not Currently    Comment: occasional   Drug use: Not Currently    Types: Marijuana    Comment: occasionally   Sexual activity: Yes  Other Topics Concern   Not on file  Social History Narrative   Lives at home with wife and two children.  Four children in all.     Social Determinants of Health   Financial Resource Strain: Not on file  Food Insecurity: No Food Insecurity (04/02/2022)   Hunger Vital Sign    Worried About Running Out of Food in the Last Year: Never true    Ran Out of Food in the Last Year: Never true  Transportation Needs: No Transportation Needs (04/02/2022)   PRAPARE - Administrator, Civil Service (Medical): No    Lack of Transportation (Non-Medical): No  Physical Activity: Not on file  Stress: Not on file  Social Connections: Not on file    Subjective: ROS   Objective: There were no vitals taken for this visit. Physical Exam   Assessment: *  Plan:   05/02/2022 4:00 PM   Disclaimer: This note was dictated with voice recognition software. Similar sounding words can inadvertently be transcribed and may not be corrected upon review.

## 2022-05-03 ENCOUNTER — Telehealth: Payer: Self-pay | Admitting: *Deleted

## 2022-05-03 ENCOUNTER — Encounter: Payer: Self-pay | Admitting: Gastroenterology

## 2022-05-03 DIAGNOSIS — K649 Unspecified hemorrhoids: Secondary | ICD-10-CM | POA: Insufficient documentation

## 2022-05-03 NOTE — Telephone Encounter (Signed)
Pt informed that procedures at to be done at [facility]:  HIDA scan: 05/09/22, arrive at 10:30 am to check in. NPO after midnight.  GES: 05/20/22, arrive at 8:30 am to check in. NPO after midnight and No GI medications after midnight.

## 2022-05-09 ENCOUNTER — Encounter (HOSPITAL_COMMUNITY)
Admission: RE | Admit: 2022-05-09 | Discharge: 2022-05-09 | Disposition: A | Discharge status: Home / Self Care | Payer: 59 | Source: Ambulatory Visit | Attending: Gastroenterology | Admitting: Gastroenterology

## 2022-05-09 DIAGNOSIS — R1013 Epigastric pain: Secondary | ICD-10-CM | POA: Insufficient documentation

## 2022-05-09 MED ORDER — TECHNETIUM TC 99M MEBROFENIN IV KIT
5.5000 | PACK | Freq: Once | INTRAVENOUS | Status: AC | PRN
  Administered 2022-05-09: 5.5 via INTRAVENOUS

## 2022-05-17 ENCOUNTER — Other Ambulatory Visit: Payer: Self-pay | Admitting: Cardiology

## 2022-05-17 ENCOUNTER — Other Ambulatory Visit: Payer: Self-pay | Admitting: Family Medicine

## 2022-05-17 DIAGNOSIS — E1169 Type 2 diabetes mellitus with other specified complication: Secondary | ICD-10-CM

## 2022-05-17 NOTE — Telephone Encounter (Signed)
Requested medication (s) are due for refill today -expired Rx  Requested medication (s) are on the active medication list -yes  Future visit scheduled -no  Last refill: 03/19/21 #90 3RF  Notes to clinic: expired Rx  Requested Prescriptions  Pending Prescriptions Disp Refills   atorvastatin (LIPITOR) 10 MG tablet [Pharmacy Med Name: ATORVASTATIN 10MG  TABLETS] 90 tablet 3    Sig: TAKE 1 TABLET(10 MG) BY MOUTH DAILY     Cardiovascular:  Antilipid - Statins Failed - 05/17/2022  1:31 PM      Failed - Valid encounter within last 12 months    Recent Outpatient Visits           1 year ago Type 2 diabetes mellitus with hyperglycemia, without long-term current use of insulin (HCC)   [facility] Pickard, Priscille Heidelberg, MD   1 year ago Acute midline low back pain, unspecified whether sciatica present   [facility] [name], MD   1 year ago Depression, major, single episode, moderate (HCC)   [facility] Pickard, Priscille Heidelberg, MD   1 year ago SOB (shortness of breath) on exertion   [facility] Pickard, Priscille Heidelberg, MD   1 year ago Upper respiratory tract infection, unspecified type   [facility] Cathlean Marseilles A, NP              Failed - Lipid Panel in normal range within the last 12 months    Cholesterol, Total  Date Value Ref Range Status  05/18/2020 221 (H) 100 - 199 mg/dL Final   Cholesterol  Date Value Ref Range Status  10/18/2021 199 <200 mg/dL Final   LDL Cholesterol (Calc)  Date Value Ref Range Status  10/18/2021 122 (H) mg/dL (calc) Final    Comment:    Reference range: <100 . Desirable range <100 mg/dL for primary prevention;   <70 mg/dL for patients with CHD or diabetic patients  with > or = 2 CHD risk factors. Marland Kitchen LDL-C is now calculated using the Martin-Hopkins  calculation, which is a validated novel method providing  better accuracy than the Friedewald equation in the   estimation of LDL-C.  Horald Pollen et al. Lenox Ahr. 4098;119(14): 2061-2068  (http://education.[facility].com/faq/FAQ164)    HDL  Date Value Ref Range Status  10/18/2021 41 > OR = 40 mg/dL Final  78/29/5621 53 >30 mg/dL Final   Triglycerides  Date Value Ref Range Status  10/18/2021 235 (H) <150 mg/dL Final    Comment:    . If a non-fasting specimen was collected, consider repeat triglyceride testing on a fasting specimen if clinically indicated.  Perry Mount et al. J. of Clin. Lipidol. 2015;9:129-169. Marland Kitchen          Passed - Patient is not pregnant         Requested Prescriptions  Pending Prescriptions Disp Refills   atorvastatin (LIPITOR) 10 MG tablet [Pharmacy Med Name: ATORVASTATIN 10MG  TABLETS] 90 tablet 3    Sig: TAKE 1 TABLET(10 MG) BY MOUTH DAILY     Cardiovascular:  Antilipid - Statins Failed - 05/17/2022  1:31 PM      Failed - Valid encounter within last 12 months    Recent Outpatient Visits           1 year ago Type 2 diabetes mellitus with hyperglycemia, without long-term current use of insulin (HCC)   [facility] Pickard, Priscille Heidelberg, MD   1 year ago Acute midline low back pain,  unspecified whether sciatica present   [facility] Tanya Nones, Priscille Heidelberg, MD   1 year ago Depression, major, single episode, moderate (HCC)   [facility] Pickard, Priscille Heidelberg, MD   1 year ago SOB (shortness of breath) on exertion   [facility] Pickard, Priscille Heidelberg, MD   1 year ago Upper respiratory tract infection, unspecified type   [facility] Cathlean Marseilles A, NP              Failed - Lipid Panel in normal range within the last 12 months    Cholesterol, Total  Date Value Ref Range Status  05/18/2020 221 (H) 100 - 199 mg/dL Final   Cholesterol  Date Value Ref Range Status  10/18/2021 199 <200 mg/dL Final   LDL Cholesterol (Calc)  Date Value Ref Range Status  10/18/2021 122 (H) mg/dL (calc)  Final    Comment:    Reference range: <100 . Desirable range <100 mg/dL for primary prevention;   <70 mg/dL for patients with CHD or diabetic patients  with > or = 2 CHD risk factors. Marland Kitchen LDL-C is now calculated using the Martin-Hopkins  calculation, which is a validated novel method providing  better accuracy than the Friedewald equation in the  estimation of LDL-C.  Horald Pollen et al. Lenox Ahr. 6045;409(81): 2061-2068  (http://education.[facility].com/faq/FAQ164)    HDL  Date Value Ref Range Status  10/18/2021 41 > OR = 40 mg/dL Final  19/14/7829 53 >56 mg/dL Final   Triglycerides  Date Value Ref Range Status  10/18/2021 235 (H) <150 mg/dL Final    Comment:    . If a non-fasting specimen was collected, consider repeat triglyceride testing on a fasting specimen if clinically indicated.  Perry Mount et al. J. of Clin. Lipidol. 2015;9:129-169. Marland Kitchen          Passed - Patient is not pregnant

## 2022-05-20 ENCOUNTER — Encounter (HOSPITAL_COMMUNITY)
Admission: RE | Admit: 2022-05-20 | Discharge: 2022-05-20 | Disposition: A | Discharge status: Home / Self Care | Payer: 59 | Source: Ambulatory Visit | Attending: Gastroenterology | Admitting: Gastroenterology

## 2022-05-20 DIAGNOSIS — R1013 Epigastric pain: Secondary | ICD-10-CM | POA: Diagnosis present

## 2022-05-20 MED ORDER — TECHNETIUM TC 99M SULFUR COLLOID
2.1000 | Freq: Once | INTRAVENOUS | Status: AC | PRN
  Administered 2022-05-20: 2.1 via ORAL

## 2022-05-22 ENCOUNTER — Other Ambulatory Visit (HOSPITAL_COMMUNITY): Payer: Self-pay

## 2022-05-22 MED ORDER — OXYCODONE HCL 10 MG PO TABS
10.0000 mg | ORAL_TABLET | Freq: Three times a day (TID) | ORAL | 0 refills | Status: DC | PRN
  Filled 2022-05-22 – 2022-05-29 (×2): qty 180, 30d supply, fill #0

## 2022-05-25 IMAGING — CT CT PELVIS W/ CM
2 of 3 series · 16 of 46 positions shown, 18 images · IV contrast (omnipaque)
Comparison: None.

CLINICAL DATA: Draining wound on buttocks after trauma several days
ago.

EXAM:
CT PELVIS WITH CONTRAST
TECHNIQUE: Multidetector CT imaging of the pelvis was performed using the
standard protocol following the bolus administration of intravenous
contrast.
CONTRAST:  100mL OMNIPAQUE IOHEXOL 300 MG/ML  SOLN

[Series 4: axial st · axial · 0.98mm/px · z∈[+382,+712]mm · 13 of 76 slices shown, 15 images]
[im 5/76  soft-tissue]
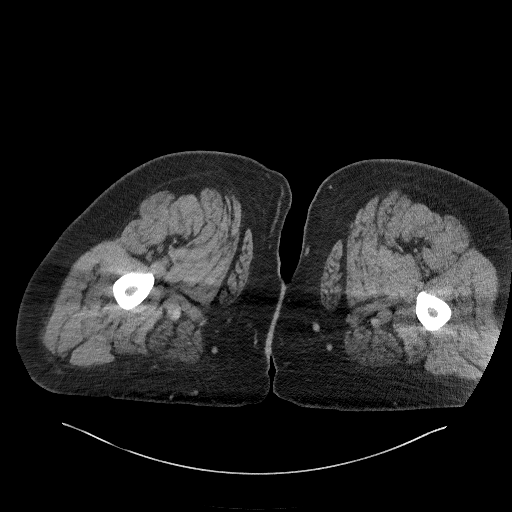
[im 5/76  bone]
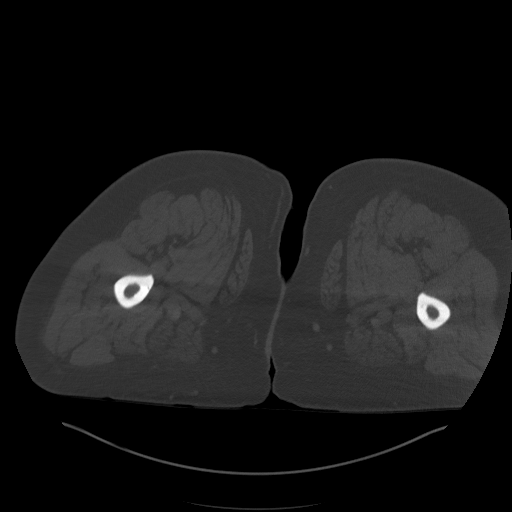
[im 10/76  soft-tissue]
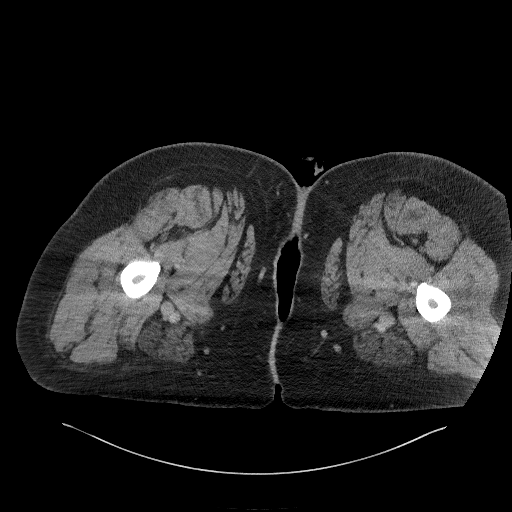
[im 15/76  soft-tissue]
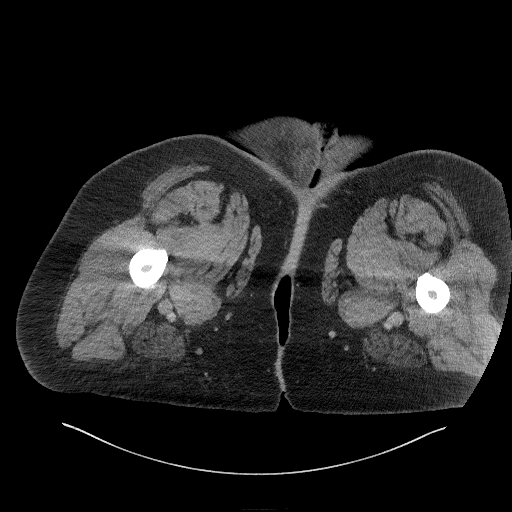
[im 22/76  soft-tissue]
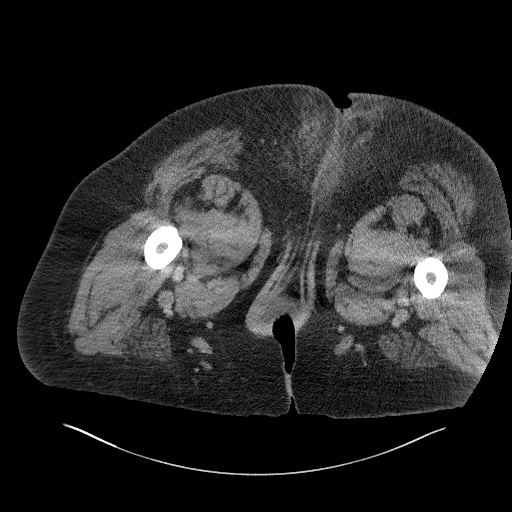
[im 27/76  soft-tissue]
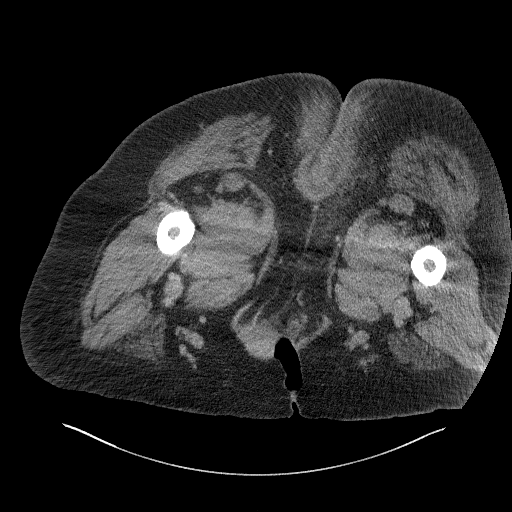
[im 32/76  soft-tissue]
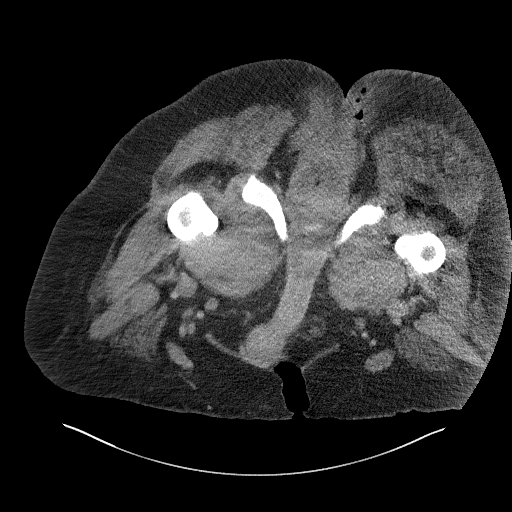
[im 39/76  soft-tissue]
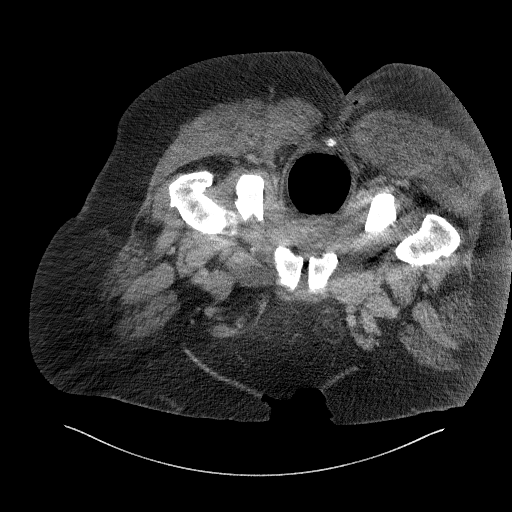
[im 44/76  soft-tissue]
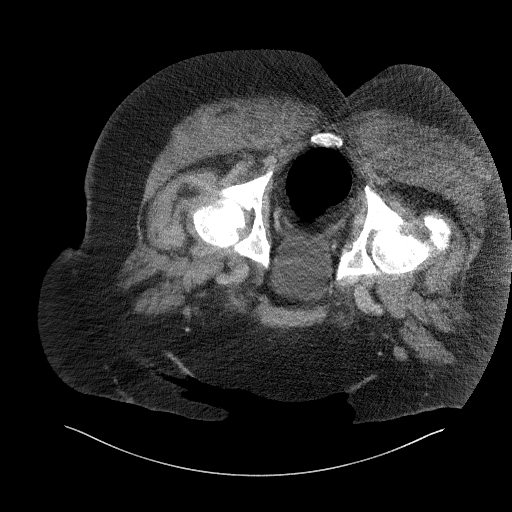
[im 49/76  soft-tissue]
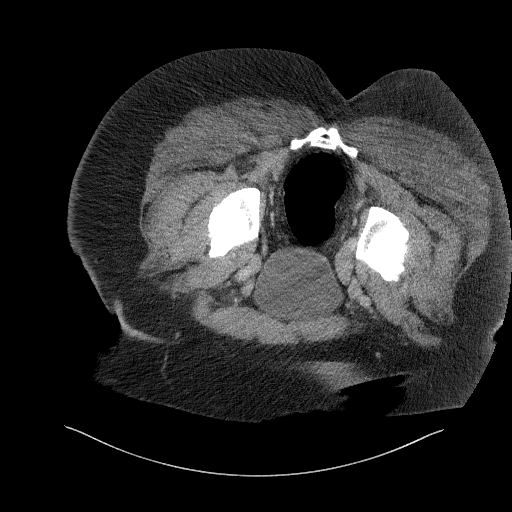
[im 49/76  bone]
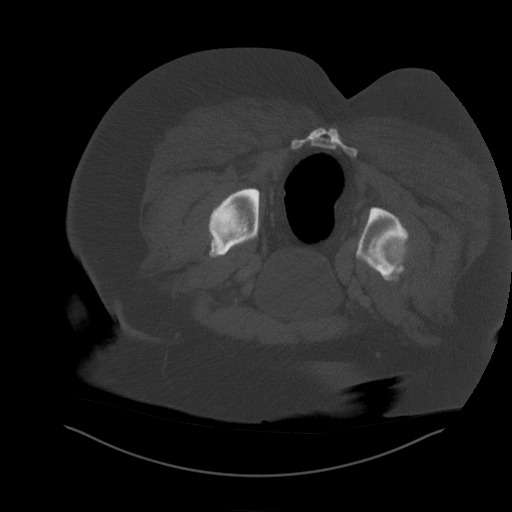
[im 54/76  soft-tissue]
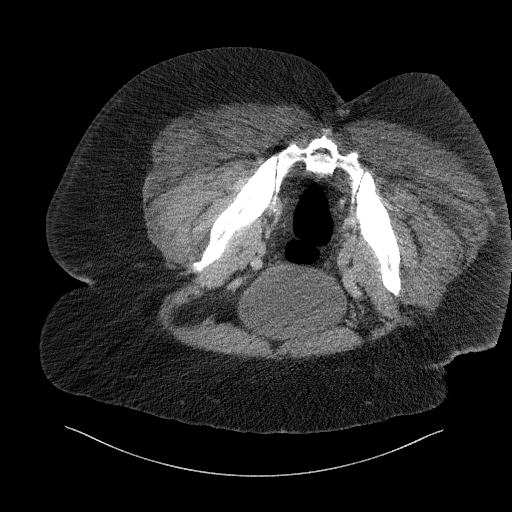
[im 61/76  soft-tissue]
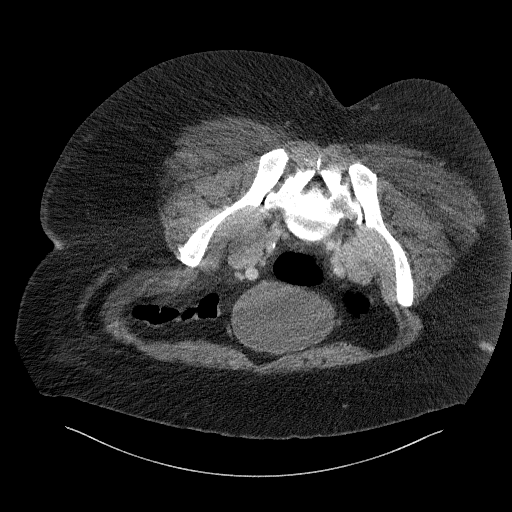
[im 66/76  soft-tissue]
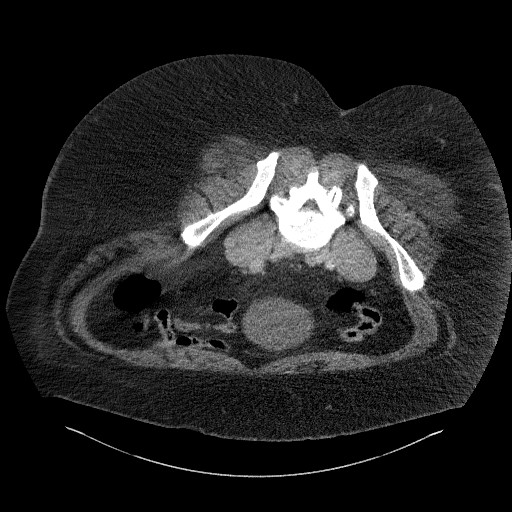
[im 71/76  soft-tissue]
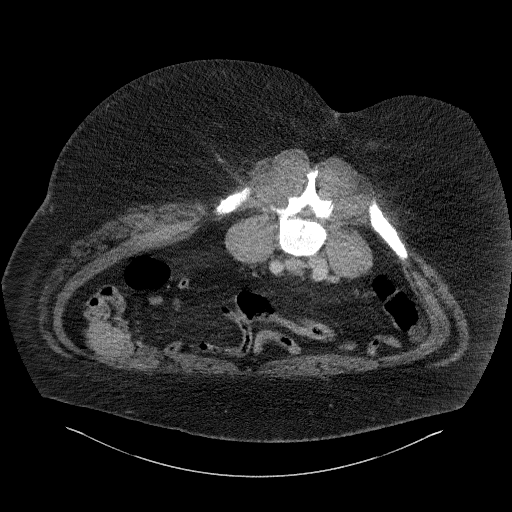

[Series 6: coronal st · coronal · 0.81mm/px · 3 of 131 slices shown]
[im 44/131  soft-tissue]
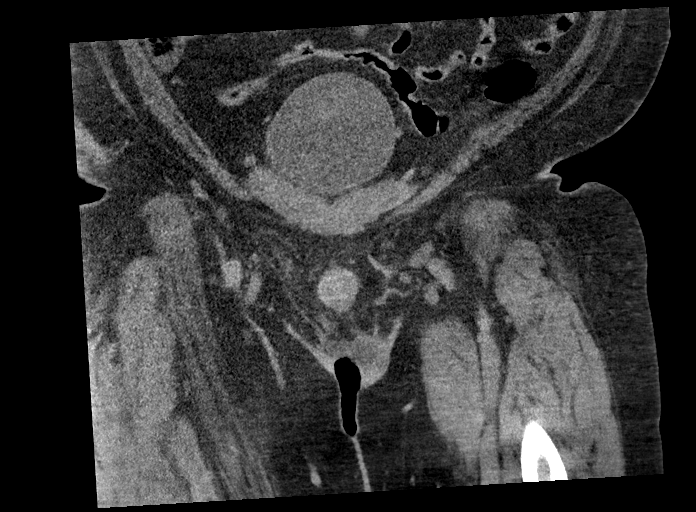
[im 58/131  soft-tissue]
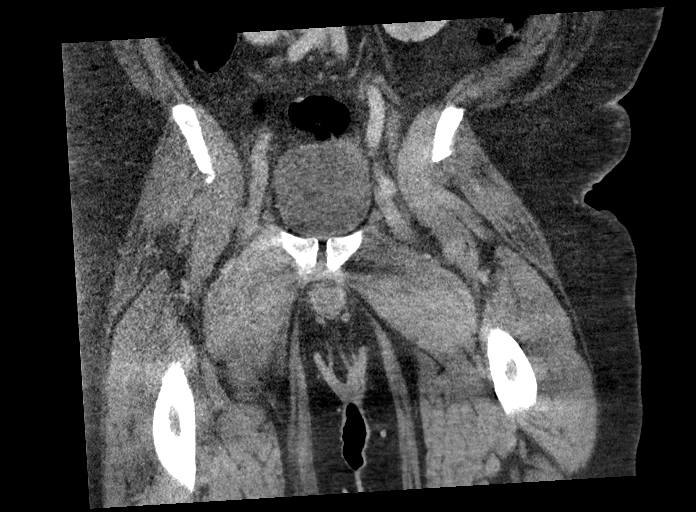
[im 73/131  soft-tissue]
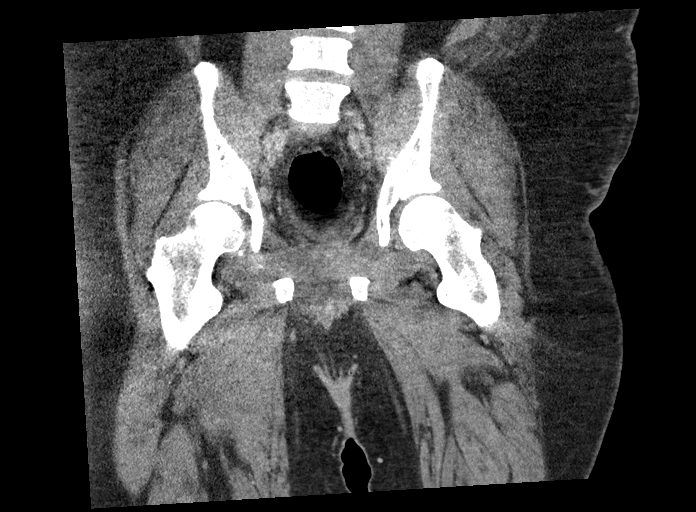

[16 of 46 positions shown; findings below may reference images not displayed]

FINDINGS: Mild limitations secondary to patient body habitus.

Urinary Tract:  Normal urinary bladder.  No distal hydroureter.

Bowel: Suggestion of pelvic floor laxity/descent with possible
rectal prolapse, including on 47/4. Otherwise normal pelvic bowel
loops.

Vascular/Lymphatic: No pelvic aneurysm. Pelvic sidewall adenopathy,
most significant in the right external iliac station of 1.6 cm on
[DATE]. Right inguinal node measures 1.5 cm on 44/4.

Reproductive:  Normal prostate.

Other: No significant free fluid. Tiny fat containing right inguinal
hernia.

Edema is identified about the gluteal crease bilaterally, including
on 47/4. There are areas of subcutaneous gas on the right with skin
irregularity including on 59/4. Minimal left gas on 42/4. no
well-defined fluid collection to suggest drainable abscess.

Musculoskeletal: No acute osseous abnormality.
IMPRESSION: 1. Right greater than left edema and subcutaneous gas about the
gluteal creases. Likely indicative of cellulitis. If
perianal/perirectal fistula is a concern, the test of choice is
high-resolution pre and post-contrast pelvic MRI. Rectal prolapse
2. Suggestion of pelvic floor descent with possible.
3. Pelvic adenopathy, most likely reactive. This could be
re-evaluated with pelvic CT follow-up at 3-6 months.
4. Mild degradation secondary to patient body habitus.

## 2022-05-27 ENCOUNTER — Other Ambulatory Visit (HOSPITAL_COMMUNITY): Payer: Self-pay

## 2022-05-29 ENCOUNTER — Other Ambulatory Visit (HOSPITAL_COMMUNITY): Payer: Self-pay

## 2022-06-01 ENCOUNTER — Other Ambulatory Visit: Payer: Self-pay | Admitting: Family Medicine

## 2022-06-03 NOTE — Telephone Encounter (Signed)
Requested medication (s) are due for refill today - yes  Requested medication (s) are on the active medication list -yes  Future visit scheduled -no  Last refill: 04/02/22 #30 1RF  Notes to clinic: non delegated Rx  Requested Prescriptions  Pending Prescriptions Disp Refills   zolpidem (AMBIEN) 10 MG tablet [Pharmacy Med Name: ZOLPIDEM 10MG  TABLETS] 30 tablet     Sig: TAKE 1 TABLET(10 MG) BY MOUTH AT BEDTIME AS NEEDED FOR SLEEP     Not Delegated - Psychiatry:  Anxiolytics/Hypnotics Failed - 06/01/2022  9:11 PM      Failed - This refill cannot be delegated      Failed - Urine Drug Screen completed in last 360 days      Failed - Valid encounter within last 6 months    Recent Outpatient Visits           1 year ago Type 2 diabetes mellitus with hyperglycemia, without long-term current use of insulin (HCC)   [facility] [name], MD   1 year ago Acute midline low back pain, unspecified whether sciatica present   [facility] [name], MD   1 year ago Depression, major, single episode, moderate (HCC)   [facility] Pickard, Priscille Heidelberg, MD   1 year ago SOB (shortness of breath) on exertion   [facility] Tanya Nones, Priscille Heidelberg, MD   2 years ago Upper respiratory tract infection, unspecified type   [facility] Valentino Nose, NP                 Requested Prescriptions  Pending Prescriptions Disp Refills   zolpidem (AMBIEN) 10 MG tablet [Pharmacy Med Name: ZOLPIDEM 10MG  TABLETS] 30 tablet     Sig: TAKE 1 TABLET(10 MG) BY MOUTH AT BEDTIME AS NEEDED FOR SLEEP     Not Delegated - Psychiatry:  Anxiolytics/Hypnotics Failed - 06/01/2022  9:11 PM      Failed - This refill cannot be delegated      Failed - Urine Drug Screen completed in last 360 days      Failed - Valid encounter within last 6 months    Recent Outpatient Visits           1 year ago Type 2 diabetes mellitus with  hyperglycemia, without long-term current use of insulin (HCC)   [facility] [name], MD   1 year ago Acute midline low back pain, unspecified whether sciatica present   [facility] [name], MD   1 year ago Depression, major, single episode, moderate (HCC)   [facility] Pickard, Priscille Heidelberg, MD   1 year ago SOB (shortness of breath) on exertion   [facility] Pickard, Priscille Heidelberg, MD   2 years ago Upper respiratory tract infection, unspecified type   [facility] Valentino Nose, NP

## 2022-06-14 ENCOUNTER — Other Ambulatory Visit: Payer: Self-pay | Admitting: Family Medicine

## 2022-06-14 NOTE — Telephone Encounter (Signed)
Called pt to make an appointment. Spoke with pt. He will call back when he has his wife's schedule.

## 2022-06-14 NOTE — Telephone Encounter (Signed)
Requested medications are due for refill today.  yes  Requested medications are on the active medications list.  yes  Last refill. 03/11/2022 #90 0 rf  Future visit scheduled.   no  Notes to clinic.  Pt last seen 10/18/2021.    Requested Prescriptions  Pending Prescriptions Disp Refills   prazosin (MINIPRESS) 2 MG capsule [Pharmacy Med Name: PRAZOSIN 2MG  CAPSULES] 90 capsule 0    Sig: TAKE 1 CAPSULE(2 MG) BY MOUTH AT BEDTIME     Cardiovascular:  Alpha Blockers Failed - 06/14/2022 12:58 PM      Failed - Valid encounter within last 6 months    Recent Outpatient Visits           1 year ago Type 2 diabetes mellitus with hyperglycemia, without long-term current use of insulin (HCC)   [facility] Pickard, Priscille Heidelberg, MD   1 year ago Acute midline low back pain, unspecified whether sciatica present   [facility] [name], MD   2 years ago Depression, major, single episode, moderate (HCC)   [facility] [name], MD   2 years ago SOB (shortness of breath) on exertion   [facility] Tanya Nones Priscille Heidelberg, MD   2 years ago Upper respiratory tract infection, unspecified type   [facility] Cathlean Marseilles A, NP              Passed - Last BP in normal range    BP Readings from Last 1 Encounters:  05/02/22 101/67

## 2022-06-18 ENCOUNTER — Other Ambulatory Visit (HOSPITAL_COMMUNITY): Payer: Self-pay

## 2022-06-19 ENCOUNTER — Other Ambulatory Visit (HOSPITAL_COMMUNITY): Payer: Self-pay

## 2022-06-19 MED ORDER — OXYCODONE HCL 10 MG PO TABS
10.0000 mg | ORAL_TABLET | Freq: Three times a day (TID) | ORAL | 0 refills | Status: DC | PRN
  Filled 2022-06-19 – 2022-06-28 (×2): qty 180, 30d supply, fill #0

## 2022-06-20 ENCOUNTER — Other Ambulatory Visit (HOSPITAL_COMMUNITY): Payer: Self-pay

## 2022-06-20 MED ORDER — MOUNJARO 15 MG/0.5ML ~~LOC~~ SOAJ
15.0000 mg | SUBCUTANEOUS | 7 refills | Status: DC
  Filled 2022-06-20: qty 2, 28d supply, fill #0
  Filled 2022-07-14: qty 2, 28d supply, fill #1

## 2022-06-25 ENCOUNTER — Other Ambulatory Visit: Payer: Self-pay | Admitting: Family Medicine

## 2022-06-25 ENCOUNTER — Encounter: Payer: Self-pay | Admitting: Hematology

## 2022-06-25 DIAGNOSIS — R0982 Postnasal drip: Secondary | ICD-10-CM

## 2022-06-25 NOTE — Telephone Encounter (Signed)
Patient will need an office visit for further refills. Requested Prescriptions  Pending Prescriptions Disp Refills   cetirizine (ZYRTEC) 10 MG tablet [Pharmacy Med Name: CETIRIZINE 10MG  TABLETS] 90 tablet 0    Sig: TAKE 1 TABLET BY MOUTH DAILY     Ear, Nose, and Throat:  Antihistamines 2 Failed - 06/25/2022  3:10 PM      Failed - Cr in normal range and within 360 days    Creat  Date Value Ref Range Status  03/22/2022 1.33 (H) 0.60 - 1.29 mg/dL Final         Failed - Valid encounter within last 12 months    Recent Outpatient Visits           1 year ago Type 2 diabetes mellitus with hyperglycemia, without long-term current use of insulin (HCC)   [facility] Pickard, Priscille Heidelberg, MD   1 year ago Acute midline low back pain, unspecified whether sciatica present   [facility] [name], MD   2 years ago Depression, major, single episode, moderate (HCC)   [facility] [name], MD   2 years ago SOB (shortness of breath) on exertion   [facility] Pickard, Priscille Heidelberg, MD   2 years ago Upper respiratory tract infection, unspecified type   [facility] Medicine Valentino Nose, NP       Future Appointments             In 1 week Pickard, Priscille Heidelberg, MD [facility]

## 2022-06-28 ENCOUNTER — Other Ambulatory Visit (HOSPITAL_COMMUNITY): Payer: Self-pay

## 2022-06-28 ENCOUNTER — Other Ambulatory Visit: Payer: Self-pay

## 2022-06-28 ENCOUNTER — Inpatient Hospital Stay: Payer: Medicare HMO | Attending: Hematology

## 2022-06-28 DIAGNOSIS — D509 Iron deficiency anemia, unspecified: Secondary | ICD-10-CM | POA: Insufficient documentation

## 2022-06-28 DIAGNOSIS — D573 Sickle-cell trait: Secondary | ICD-10-CM

## 2022-06-28 DIAGNOSIS — D500 Iron deficiency anemia secondary to blood loss (chronic): Secondary | ICD-10-CM

## 2022-06-28 LAB — COMPREHENSIVE METABOLIC PANEL
ALT: 21 U/L (ref 0–44)
AST: 16 U/L (ref 15–41)
Albumin: 3.6 g/dL (ref 3.5–5.0)
Alkaline Phosphatase: 80 U/L (ref 38–126)
Anion gap: 7 (ref 5–15)
BUN: 13 mg/dL (ref 6–20)
CO2: 29 mmol/L (ref 22–32)
Calcium: 9.1 mg/dL (ref 8.9–10.3)
Chloride: 104 mmol/L (ref 98–111)
Creatinine, Ser: 1.11 mg/dL (ref 0.61–1.24)
GFR, Estimated: >60 mL/min (ref >60)
Glucose, Bld: 125 mg/dL — ABNORMAL HIGH (ref 70–99)
Potassium: 4.2 mmol/L (ref 3.5–5.1)
Sodium: 140 mmol/L (ref 135–145)
Total Bilirubin: 0.4 mg/dL (ref 0.3–1.2)
Total Protein: 7.7 g/dL (ref 6.5–8.1)

## 2022-06-28 LAB — FERRITIN: Ferritin: 197 ng/mL (ref 24–336)

## 2022-06-28 LAB — CBC WITH DIFFERENTIAL/PLATELET
Abs Immature Granulocytes: 0.03 10*3/uL (ref 0.00–0.07)
Basophils Absolute: 0 10*3/uL (ref 0.0–0.1)
Basophils Relative: 0 %
Eosinophils Absolute: 0.1 10*3/uL (ref 0.0–0.5)
Eosinophils Relative: 1 %
HCT: 34.5 % — ABNORMAL LOW (ref 39.0–52.0)
Hemoglobin: 10.2 g/dL — ABNORMAL LOW (ref 13.0–17.0)
Immature Granulocytes: 0 %
Lymphocytes Relative: 31 %
Lymphs Abs: 3 10*3/uL (ref 0.7–4.0)
MCH: 21.1 pg — ABNORMAL LOW (ref 26.0–34.0)
MCHC: 29.6 g/dL — ABNORMAL LOW (ref 30.0–36.0)
MCV: 71.4 fL — ABNORMAL LOW (ref 80.0–100.0)
Monocytes Absolute: 0.7 10*3/uL (ref 0.1–1.0)
Monocytes Relative: 7 %
Neutro Abs: 6 10*3/uL (ref 1.7–7.7)
Neutrophils Relative %: 61 %
Platelets: 235 10*3/uL (ref 150–400)
RBC: 4.83 MIL/uL (ref 4.22–5.81)
RDW: 14.9 % (ref 11.5–15.5)
WBC: 9.9 10*3/uL (ref 4.0–10.5)
nRBC: 0 % (ref 0.0–0.2)

## 2022-06-28 LAB — IRON AND TIBC
Iron: 63 ug/dL (ref 45–182)
Saturation Ratios: 23 % (ref 17.9–39.5)
TIBC: 279 ug/dL (ref 250–450)
UIBC: 216 ug/dL

## 2022-07-02 ENCOUNTER — Ambulatory Visit (INDEPENDENT_AMBULATORY_CARE_PROVIDER_SITE_OTHER): Payer: 59 | Admitting: Family Medicine

## 2022-07-02 ENCOUNTER — Encounter: Payer: Self-pay | Admitting: Family Medicine

## 2022-07-02 ENCOUNTER — Other Ambulatory Visit: Payer: Self-pay | Admitting: Family Medicine

## 2022-07-02 VITALS — BP 126/78 | HR 100 | Temp 98.6°F | Ht 68.0 in | Wt >= 6400 oz

## 2022-07-02 DIAGNOSIS — Z7985 Long-term (current) use of injectable non-insulin antidiabetic drugs: Secondary | ICD-10-CM

## 2022-07-02 DIAGNOSIS — E1165 Type 2 diabetes mellitus with hyperglycemia: Secondary | ICD-10-CM

## 2022-07-02 MED ORDER — VENLAFAXINE HCL ER 75 MG PO CP24
225.0000 mg | ORAL_CAPSULE | Freq: Every day | ORAL | 3 refills | Status: AC
Start: 2022-07-02 — End: ?

## 2022-07-02 NOTE — Progress Notes (Signed)
Subjective:    Patient ID: Ricardo Lopez., male    DOB: [birth year], 47 y.o.   MRN: [id number] Patient is here today to recheck his diabetes.  He has been dealing with anemia that has been attributed to GI bleeding from hemorrhoids.  He is following up with GI he has received an iron transfusion.  Hemoglobin is maintaining around 10.  He has had endoscopy and colonoscopy with no other source of bleeding identified.  He is lost over 100 pounds on Mounjaro.  Again today we discussed a referral for gastric bypass but the patient declines this at the present time.  He denies any chest pain.  He has chronic dyspnea on exertion which I suspect is likely due to scar tissue from COVID coupled with obesity hypoventilation syndrome.  His blood pressure today is excellent.  He continues to battle depression despite being on Effexor 150 mg daily.  He has tried and failed Lexapro in the past.  He continues to have nightmares at night.  Prazosin does not seem to be helping with that.  We discussed Vraylar and Rexulti however the patient is hesitant to try these medications that he would be interested in increasing the Effexor. Past Medical History:  Diagnosis Date   Anemia    Anxiety and depression    B12 deficiency    Back pain    COVID    9/21- hypoxia requiring oxygen   Diabetes mellitus without complication (HCC)    Frequency of urination    GERD (gastroesophageal reflux disease)    Gluteal abscess    ecoli after covid hospitalization 2021   Headache    Hypertension    Obesity    Scar tissue    Scar Tissue on lungs due to Covid   Sleep apnea    Stomach ulcer    Tachycardia    Thoracic aortic aneurysm [facility])    Past Surgical History:  Procedure Laterality Date   BIOPSY  05/17/2021   Procedure: BIOPSY;  Surgeon: Rachael Fee, MD;  Location: [facility];  Service: Gastroenterology;;  EGD and COLON   COLONOSCOPY WITH PROPOFOL N/A 05/17/2021   Surgeon: Rachael Fee, MD; mild erythema  and granularity of the colonic mucosa with benign biopsies, internal hemorrhoids suspected to be etiology of rectal bleeding.   ESOPHAGOGASTRODUODENOSCOPY (EGD) WITH PROPOFOL N/A 05/17/2021   Surgeon: Rachael Fee, MD; very mild, nonspecific distal gastritis s/p biopsied, otherwise normal exam.  Gastric biopsy was benign, negative for H. pylori.   ESOPHAGOGASTRODUODENOSCOPY (EGD) WITH PROPOFOL N/A 04/15/2022   Procedure: ESOPHAGOGASTRODUODENOSCOPY (EGD) WITH PROPOFOL;  Surgeon: Lanelle Bal, DO;  [facility];  Service: Endoscopy;  Laterality: N/A;   FLEXIBLE SIGMOIDOSCOPY N/A 04/15/2022   Procedure: FLEXIBLE SIGMOIDOSCOPY;  Surgeon: Lanelle Bal, DO;  [facility];  Service: Endoscopy;  Laterality: N/A;  100pm, asa 3   GIVENS CAPSULE STUDY N/A 11/16/2021   Surgeon: Lanelle Bal, DO; poor and only able to identify first gastric image.   GIVENS CAPSULE STUDY N/A 04/15/2022   Procedure: GIVENS CAPSULE STUDY;  Surgeon: Lanelle Bal, DO;  [facility];  Service: Endoscopy;  Laterality: N/A;    Current Outpatient Medications on File Prior to Visit  Medication Sig Dispense Refill   acetaminophen (TYLENOL) 325 MG tablet Take 650 mg by mouth every 6 (six) hours as needed for moderate pain.     albuterol (VENTOLIN HFA) 108 (90 Base) MCG/ACT inhaler Inhale 2 puffs into the  lungs every 4 (four) hours as needed for wheezing or shortness of breath. 18 g 2   ascorbic acid (VITAMIN C) 500 MG tablet Take 1 tablet (500 mg total) by mouth daily. 30 tablet 1   atorvastatin (LIPITOR) 10 MG tablet TAKE 1 TABLET(10 MG) BY MOUTH DAILY 30 tablet 1   Blood Glucose Monitoring Suppl (ONE TOUCH ULTRA 2) w/Device KIT 1 each by Does not apply route 3 (three) times daily. 1 kit 1   buPROPion (WELLBUTRIN XL) 150 MG 24 hr tablet TAKE 1 TABLET(150 MG) BY MOUTH DAILY 30 tablet 5   cetirizine (ZYRTEC) 10 MG tablet TAKE 1 TABLET BY MOUTH DAILY 90 tablet 0   Cholecalciferol  (VITAMIN D) 50 MCG (2000 UT) tablet Take 2,000 Units by mouth daily.     ciclopirox (PENLAC) 8 % solution Apply topically at bedtime. Apply over nail and surrounding skin. Apply daily over previous coat. After seven (7) days, may remove with alcohol and continue cycle. 6.6 mL 5   clotrimazole-betamethasone (LOTRISONE) cream APPLY TOPICALLY TO THE AFFECTED AREA TWICE DAILY (Patient taking differently: Apply 1 Application topically 2 (two) times daily as needed (irritation).) 45 g 1   dexlansoprazole (DEXILANT) 60 MG capsule Take 1 capsule (60 mg total) by mouth daily. 30 capsule 3   ferrous sulfate (FEROSUL) 325 (65 FE) MG tablet TAKE 1 TABLET BY MOUTH EVERY MORNING 90 tablet 1   fluticasone (FLONASE) 50 MCG/ACT nasal spray Place 2 sprays into both nostrils daily as needed for allergies. 16 g 3   glucose blood (ONETOUCH ULTRA) test strip 1 each by Other route 3 (three) times daily. Use to check blood sugar three times daily as instructed 100 each 11   hydrocortisone (ANUSOL-HC) 25 MG suppository Place 1 suppository (25 mg total) rectally 2 (two) times daily. 30 suppository 1   Lancets MISC Check fbs daily 100 each 11   metoprolol succinate (TOPROL-XL) 100 MG 24 hr tablet TAKE 1 TABLET(100 MG) BY MOUTH DAILY 30 tablet 0   Multiple Vitamin (MULTIVITAMIN) tablet Take 1 tablet by mouth daily.     ofloxacin (OCUFLOX) 0.3 % ophthalmic solution Place 1 drop into eye(s) 4 (four) times daily as needed for eye infection. 5 mL 0   Oxycodone HCl 10 MG TABS Take 1-2 tablets (10-20 mg total) by mouth 3 (three) times daily as needed. 180 tablet 0   Oxycodone HCl 10 MG TABS Take 1-2 tablets (10-20 mg total) by mouth 3 (three) times daily as needed. 180 tablet 0   Oxycodone HCl 10 MG TABS Take 1-2 tablets (10-20 mg total) by mouth 3 (three) times daily as needed. 180 tablet 0   Oxycodone HCl 10 MG TABS Take 1-2 tablets (10-20 mg total) by mouth 3 (three) times daily as needed. 180 tablet 0   prazosin (MINIPRESS) 2 MG  capsule TAKE 1 CAPSULE(2 MG) BY MOUTH AT BEDTIME 30 capsule 0   sodium chloride (OCEAN) 0.65 % SOLN nasal spray Place 1 spray into both nostrils as needed for congestion. 88 mL 0   sucralfate (CARAFATE) 1 g tablet TAKE 1 TABLET WITH MEALS AND AT BEDTIME MAX OF 4/DAY (Patient taking differently: Take 1 g by mouth 3 (three) times daily as needed.) 360 tablet 0   tirzepatide (MOUNJARO) 15 MG/0.5ML Pen Inject 15 mg into the skin once a week. 2 mL 7   zolpidem (AMBIEN) 10 MG tablet TAKE 1 TABLET(10 MG) BY MOUTH AT BEDTIME AS NEEDED FOR SLEEP 30 tablet 0   potassium chloride  SA (KLOR-CON M) 20 MEQ tablet TAKE 2 TABLETS BY MOUTH DAILY (Patient not taking: Reported on 07/02/2022) 30 tablet 0   No current facility-administered medications on file prior to visit.   No Known Allergies Social History   Socioeconomic History   Marital status: Married    Spouse name: Not on file   Number of children: Not on file   Years of education: Not on file   Highest education level: Not on file  Occupational History   Occupation: stay at home spouse  Tobacco Use   Smoking status: Never   Smokeless tobacco: Never  Vaping Use   Vaping Use: Never used  Substance and Sexual Activity   Alcohol use: Not Currently    Comment: occasional   Drug use: Not Currently    Types: Marijuana    Comment: occasionally   Sexual activity: Yes  Other Topics Concern   Not on file  Social History Narrative   Lives at home with wife and two children.  Four children in all.     Social Determinants of Health   Financial Resource Strain: Not on file  Food Insecurity: No Food Insecurity (04/02/2022)   Hunger Vital Sign    Worried About Running Out of Food in the Last Year: Never true    Ran Out of Food in the Last Year: Never true  Transportation Needs: No Transportation Needs (04/02/2022)   PRAPARE - Administrator, Civil Service (Medical): No    Lack of Transportation (Non-Medical): No  Physical Activity: Not on  file  Stress: Not on file  Social Connections: Not on file  Intimate Partner Violence: Not At Risk (04/02/2022)   Humiliation, Afraid, Rape, and Kick questionnaire    Fear of Current or Ex-Partner: No    Emotionally Abused: No    Physically Abused: No    Sexually Abused: No    Review of Systems  Skin:  Positive for rash.  All other systems reviewed and are negative.      Objective:   Physical Exam Vitals reviewed.  Constitutional:      Appearance: He is obese.  Cardiovascular:     Rate and Rhythm: Normal rate and regular rhythm.     Heart sounds: Normal heart sounds.  Pulmonary:     Effort: Pulmonary effort is normal. No respiratory distress.     Breath sounds: Normal breath sounds. No stridor. No wheezing, rhonchi or rales.  Musculoskeletal:        General: Deformity present.     Right lower leg: Edema present.     Left lower leg: Edema present.  Neurological:     Mental Status: He is alert.    Wt Readings from Last 3 Encounters:  07/02/22 (!) 446 lb (202.3 kg)  05/02/22 (!) 434 lb (196.9 kg)  04/23/22 (!) 432 lb (196 kg)          Assessment & Plan:   Type 2 diabetes mellitus with hyperglycemia, without long-term current use of insulin (HCC) - Plan: COMPLETE METABOLIC PANEL WITH GFR, Lipid panel, Hemoglobin A1c I will increase Effexor to 225 mg daily for his depression.  I would be willing to put the patient on Vraylar however at the present time he would like to defer that.  Strongly recommended gastric bypass as his weight has not improved despite being on the maximum dose Mounjaro.  Patient states that he is lost more than 100 pounds however according to our scales here, his weight is actually up  12 pounds from April.  Check hemoglobin A1c.  If greater than 6.5 I would recommend starting the patient on Jardiance or metformin

## 2022-07-03 ENCOUNTER — Encounter: Payer: Self-pay | Admitting: Internal Medicine

## 2022-07-03 ENCOUNTER — Ambulatory Visit (INDEPENDENT_AMBULATORY_CARE_PROVIDER_SITE_OTHER): Payer: 59 | Admitting: Internal Medicine

## 2022-07-03 ENCOUNTER — Telehealth: Payer: Self-pay | Admitting: *Deleted

## 2022-07-03 VITALS — BP 123/84 | HR 71 | Temp 97.1°F | Ht 68.0 in | Wt >= 6400 oz

## 2022-07-03 DIAGNOSIS — K625 Hemorrhage of anus and rectum: Secondary | ICD-10-CM | POA: Diagnosis not present

## 2022-07-03 DIAGNOSIS — R590 Localized enlarged lymph nodes: Secondary | ICD-10-CM

## 2022-07-03 DIAGNOSIS — R1033 Periumbilical pain: Secondary | ICD-10-CM | POA: Diagnosis not present

## 2022-07-03 DIAGNOSIS — K648 Other hemorrhoids: Secondary | ICD-10-CM

## 2022-07-03 LAB — COMPLETE METABOLIC PANEL WITH GFR
AG Ratio: 1.3 (calc) (ref 1.0–2.5)
ALT: 23 U/L (ref 9–46)
AST: 17 U/L (ref 10–40)
Albumin: 3.9 g/dL (ref 3.6–5.1)
Alkaline phosphatase (APISO): 82 U/L (ref 36–130)
BUN: 14 mg/dL (ref 7–25)
CO2: 30 mmol/L (ref 20–32)
Calcium: 9.4 mg/dL (ref 8.6–10.3)
Chloride: 103 mmol/L (ref 98–110)
Creat: 1.15 mg/dL (ref 0.60–1.29)
Globulin: 3 g/dL (calc) (ref 1.9–3.7)
Glucose, Bld: 110 mg/dL — ABNORMAL HIGH (ref 65–99)
Potassium: 4.6 mmol/L (ref 3.5–5.3)
Sodium: 141 mmol/L (ref 135–146)
Total Bilirubin: 0.2 mg/dL (ref 0.2–1.2)
Total Protein: 6.9 g/dL (ref 6.1–8.1)
eGFR: 79 mL/min/{1.73_m2} (ref >=60)

## 2022-07-03 LAB — HEMOGLOBIN A1C
Hgb A1c MFr Bld: 6.1 % of total Hgb — ABNORMAL HIGH (ref <5.7)
Mean Plasma Glucose: 128 mg/dL
eAG (mmol/L): 7.1 mmol/L

## 2022-07-03 LAB — LIPID PANEL
Cholesterol: 151 mg/dL (ref <200)
HDL: 43 mg/dL (ref >=40)
LDL Cholesterol (Calc): 88 mg/dL (calc)
Non-HDL Cholesterol (Calc): 108 mg/dL (calc) (ref <130)
Total CHOL/HDL Ratio: 3.5 (calc) (ref <5.0)
Triglycerides: 108 mg/dL (ref <150)

## 2022-07-03 LAB — HGB SOLUBILITY: Hgb Solubility: POSITIVE — AB

## 2022-07-03 LAB — HGB FRACTIONATION CASCADE
Hgb A2: 3.6 % — ABNORMAL HIGH (ref 1.8–3.2)
Hgb A: 68.8 % — ABNORMAL LOW (ref 96.4–98.8)
Hgb F: 0 % (ref 0.0–2.0)
Hgb S: 27.6 % — ABNORMAL HIGH

## 2022-07-03 NOTE — Patient Instructions (Signed)
I am going to order blood work today at [facility] to check inflammatory markers which may be an indication of inflammation in your colon.  I am also going to order a CT abdomen pelvis with contrast to reevaluate previously noted enlarged lymph node.  We will call with results.  Follow-up in GI office in 4 to 6 weeks.  It was very nice seeing both you today.  Dr. Marletta Lor

## 2022-07-03 NOTE — Progress Notes (Signed)
Referring Provider: [name], MD Primary Care Physician:  [name], MD Primary GI:  Dr. Marletta Lor  Chief Complaint  Patient presents with   Follow-up    Patient here today for a follow up appointment on anemia. Patient says he is still seeing dark and bloody stools. Patient denies any shortness of than what is usual for him, no dizziness, no chest pain. Patient does have some issues with fatigue, but thinks this is due to all the other issues he has going on. Hgb las drawn 06/28/2022 at 10.2 hct 34.5 Fe Sat5 23.    HPI:   Ricardo Lopez. is a 47 y.o. male who presents to the clinic today for fu visit. He has a history of chronic microcytic anemia at least since 2019, rectal bleeding, hemorrhoids, GERD, chronic upper abdominal pain.    Prior GI work-up:  EGD/colonoscopy April 2023 by  Dr. Christella Hartigan as patient was not a candidate for procedures at [facility] due to morbid obesity: EGD: very mild, nonspecific distal gastritis s/p biopsied, otherwise normal exam.  Gastric biopsy was benign, negative for H. pylori.    Colonoscopy: Mild erythema and granularity of the colon mucosa, possibly more significant on the left than the right side. Biopsies taken to check for chronic inflammation versus possible prep effect. Internal hemorrhoids. The examination was otherwise normal on direct and retroflexion views.  Colon biopsies were benign.  Overall, suspected rectal bleeding likely secondary to hemorrhoids.    Hemorrhoid banding x 3.  Body habitus complicated hemorrhoid banding resulting in banding only in the neutral position.  Last attempted hemorrhoid banding October 2023, unsuccessful attempt to put a band on sufficient tissue x 2. No real rectal pain.    Givens capsule 11/16/2021: Poor prep, only able to identify first gastric image.   CT enterography 01/18/2022 with no significant bowel abnormalities.  He had a 1.7 cm right external iliac lymph node of uncertain  etiology and significance.  Recommended follow-up CT in 3-6 months.  He is on recall for repeat CT.   Flexible sigmoidoscopy 04/15/2022 unremarkable aside from internal hemorrhoids.   EGD 04/15/2022: Gastritis, otherwise normal exam.   Givens capsule 3/25: Entirely normal exam.  HIDA scan 05/09/2022 normal.  Gastric emptying study 05/20/2022 normal  Continues to have rectal bleeding multiple times a week.  States he will get a burning in his stomach and knows that he is about to have a large bloody bowel movement.  Notes clots as well as bright red blood.  Past Medical History:  Diagnosis Date   Anemia    Anxiety and depression    B12 deficiency    Back pain    COVID    9/21- hypoxia requiring oxygen   Diabetes mellitus without complication (HCC)    Frequency of urination    GERD (gastroesophageal reflux disease)    Gluteal abscess    ecoli after covid hospitalization 2021   Headache    Hypertension    Obesity    Scar tissue    Scar Tissue on lungs due to Covid   Sleep apnea    Stomach ulcer    Tachycardia    Thoracic aortic aneurysm [facility])     Past Surgical History:  Procedure Laterality Date   BIOPSY  05/17/2021   Procedure: BIOPSY;  Surgeon: Rachael Fee, MD;  Location: [facility];  Service: Gastroenterology;;  EGD and COLON   COLONOSCOPY WITH PROPOFOL N/A 05/17/2021   Surgeon: Rachael Fee,  MD; mild erythema and granularity of the colonic mucosa with benign biopsies, internal hemorrhoids suspected to be etiology of rectal bleeding.   ESOPHAGOGASTRODUODENOSCOPY (EGD) WITH PROPOFOL N/A 05/17/2021   Surgeon: Rachael Fee, MD; very mild, nonspecific distal gastritis s/p biopsied, otherwise normal exam.  Gastric biopsy was benign, negative for H. pylori.   ESOPHAGOGASTRODUODENOSCOPY (EGD) WITH PROPOFOL N/A 04/15/2022   Procedure: ESOPHAGOGASTRODUODENOSCOPY (EGD) WITH PROPOFOL;  Surgeon: Lanelle Bal, DO;  [facility];  Service: Endoscopy;   Laterality: N/A;   FLEXIBLE SIGMOIDOSCOPY N/A 04/15/2022   Procedure: FLEXIBLE SIGMOIDOSCOPY;  Surgeon: Lanelle Bal, DO;  [facility];  Service: Endoscopy;  Laterality: N/A;  100pm, asa 3   GIVENS CAPSULE STUDY N/A 11/16/2021   Surgeon: Lanelle Bal, DO; poor and only able to identify first gastric image.   GIVENS CAPSULE STUDY N/A 04/15/2022   Procedure: GIVENS CAPSULE STUDY;  Surgeon: Lanelle Bal, DO;  [facility];  Service: Endoscopy;  Laterality: N/A;    Current Outpatient Medications  Medication Sig Dispense Refill   acetaminophen (TYLENOL) 325 MG tablet Take 650 mg by mouth every 6 (six) hours as needed for moderate pain.     albuterol (VENTOLIN HFA) 108 (90 Base) MCG/ACT inhaler Inhale 2 puffs into the lungs every 4 (four) hours as needed for wheezing or shortness of breath. 18 g 2   ascorbic acid (VITAMIN C) 500 MG tablet Take 1 tablet (500 mg total) by mouth daily. 30 tablet 1   atorvastatin (LIPITOR) 10 MG tablet TAKE 1 TABLET(10 MG) BY MOUTH DAILY 30 tablet 1   Blood Glucose Monitoring Suppl (ONE TOUCH ULTRA 2) w/Device KIT 1 each by Does not apply route 3 (three) times daily. 1 kit 1   buPROPion (WELLBUTRIN XL) 150 MG 24 hr tablet TAKE 1 TABLET(150 MG) BY MOUTH DAILY 30 tablet 5   cetirizine (ZYRTEC) 10 MG tablet TAKE 1 TABLET BY MOUTH DAILY 90 tablet 0   Cholecalciferol (VITAMIN D) 50 MCG (2000 UT) tablet Take 2,000 Units by mouth daily.     ciclopirox (PENLAC) 8 % solution Apply topically at bedtime. Apply over nail and surrounding skin. Apply daily over previous coat. After seven (7) days, may remove with alcohol and continue cycle. 6.6 mL 5   clotrimazole-betamethasone (LOTRISONE) cream APPLY TOPICALLY TO THE AFFECTED AREA TWICE DAILY (Patient taking differently: Apply 1 Application topically 2 (two) times daily as needed (irritation).) 45 g 1   dexlansoprazole (DEXILANT) 60 MG capsule Take 1 capsule (60 mg total) by mouth daily. 30 capsule  3   ferrous sulfate (FEROSUL) 325 (65 FE) MG tablet TAKE 1 TABLET BY MOUTH EVERY MORNING 90 tablet 1   fluticasone (FLONASE) 50 MCG/ACT nasal spray Place 2 sprays into both nostrils daily as needed for allergies. 16 g 3   glucose blood (ONETOUCH ULTRA) test strip 1 each by Other route 3 (three) times daily. Use to check blood sugar three times daily as instructed 100 each 11   hydrocortisone (ANUSOL-HC) 25 MG suppository Place 1 suppository (25 mg total) rectally 2 (two) times daily. 30 suppository 1   Lancets MISC Check fbs daily 100 each 11   metoprolol succinate (TOPROL-XL) 100 MG 24 hr tablet TAKE 1 TABLET(100 MG) BY MOUTH DAILY 30 tablet 0   Multiple Vitamin (MULTIVITAMIN) tablet Take 1 tablet by mouth daily.     ofloxacin (OCUFLOX) 0.3 % ophthalmic solution Place 1 drop into eye(s) 4 (four) times daily as needed for eye  infection. 5 mL 0   Oxycodone HCl 10 MG TABS Take 1-2 tablets (10-20 mg total) by mouth 3 (three) times daily as needed. 180 tablet 0   Oxycodone HCl 10 MG TABS Take 1-2 tablets (10-20 mg total) by mouth 3 (three) times daily as needed. 180 tablet 0   Oxycodone HCl 10 MG TABS Take 1-2 tablets (10-20 mg total) by mouth 3 (three) times daily as needed. 180 tablet 0   Oxycodone HCl 10 MG TABS Take 1-2 tablets (10-20 mg total) by mouth 3 (three) times daily as needed. 180 tablet 0   potassium chloride SA (KLOR-CON M) 20 MEQ tablet TAKE 2 TABLETS BY MOUTH DAILY (Patient not taking: Reported on 07/02/2022) 30 tablet 0   prazosin (MINIPRESS) 2 MG capsule TAKE 1 CAPSULE(2 MG) BY MOUTH AT BEDTIME 30 capsule 0   sodium chloride (OCEAN) 0.65 % SOLN nasal spray Place 1 spray into both nostrils as needed for congestion. 88 mL 0   sucralfate (CARAFATE) 1 g tablet TAKE 1 TABLET WITH MEALS AND AT BEDTIME MAX OF 4/DAY (Patient taking differently: Take 1 g by mouth 3 (three) times daily as needed.) 360 tablet 0   tirzepatide (MOUNJARO) 15 MG/0.5ML Pen Inject 15 mg into the skin once a week. 2 mL  7   venlafaxine XR (EFFEXOR XR) 75 MG 24 hr capsule Take 3 capsules (225 mg total) by mouth daily with breakfast. 270 capsule 3   zolpidem (AMBIEN) 10 MG tablet TAKE 1 TABLET(10 MG) BY MOUTH AT BEDTIME AS NEEDED FOR SLEEP 30 tablet 0   No current facility-administered medications for this visit.    Allergies as of 07/03/2022   (No Known Allergies)    Family History  Problem Relation Age of Onset   Diabetes Mother    Hypertension Mother    Hyperlipidemia Mother    Thyroid disease Mother    Diabetes Father    Hyperlipidemia Father    Hypertension Father    Depression Father    Anxiety disorder Father    Sleep apnea Father    Colon cancer Neg Hx    Stomach cancer Neg Hx     Social History   Socioeconomic History   Marital status: Married    Spouse name: Not on file   Number of children: Not on file   Years of education: Not on file   Highest education level: Not on file  Occupational History   Occupation: stay at home spouse  Tobacco Use   Smoking status: Never   Smokeless tobacco: Never  Vaping Use   Vaping Use: Never used  Substance and Sexual Activity   Alcohol use: Not Currently    Comment: occasional   Drug use: Not Currently    Types: Marijuana    Comment: occasionally   Sexual activity: Yes  Other Topics Concern   Not on file  Social History Narrative   Lives at home with wife and two children.  Four children in all.     Social Determinants of Health   Financial Resource Strain: Not on file  Food Insecurity: No Food Insecurity (04/02/2022)   Hunger Vital Sign    Worried About Running Out of Food in the Last Year: Never true    Ran Out of Food in the Last Year: Never true  Transportation Needs: No Transportation Needs (04/02/2022)   PRAPARE - Administrator, Civil Service (Medical): No    Lack of Transportation (Non-Medical): No  Physical Activity: Not on file  Stress: Not on file  Social Connections: Not on file    Subjective: Review  of Systems  Constitutional:  Negative for chills and fever.  HENT:  Negative for congestion and hearing loss.   Eyes:  Negative for blurred vision and double vision.  Respiratory:  Negative for cough and shortness of breath.   Cardiovascular:  Negative for chest pain and palpitations.  Gastrointestinal:  Negative for abdominal pain, blood in stool, constipation, diarrhea, heartburn, melena and vomiting.  Genitourinary:  Negative for dysuria and urgency.  Musculoskeletal:  Negative for joint pain and myalgias.  Skin:  Negative for itching and rash.  Neurological:  Negative for dizziness and headaches.  Psychiatric/Behavioral:  Negative for depression. The patient is not nervous/anxious.      Objective: BP 123/84 (BP Location: Left Arm, Patient Position: Sitting, Cuff Size: Large)   Pulse 71   Temp (!) 97.1 F (36.2 C) (Temporal)   Ht 5\' 8"  (1.727 m)   Wt (!) 451 lb 8 oz (204.8 kg)   BMI 68.65 kg/m  Physical Exam Constitutional:      Appearance: Normal appearance.  HENT:     Head: Normocephalic and atraumatic.  Eyes:     Extraocular Movements: Extraocular movements intact.     Conjunctiva/sclera: Conjunctivae normal.  Cardiovascular:     Rate and Rhythm: Normal rate and regular rhythm.  Pulmonary:     Effort: Pulmonary effort is normal.     Breath sounds: Normal breath sounds.  Abdominal:     General: Bowel sounds are normal.     Palpations: Abdomen is soft.  Musculoskeletal:        General: Normal range of motion.     Cervical back: Normal range of motion and neck supple.  Skin:    General: Skin is warm.  Neurological:     General: No focal deficit present.     Mental Status: He is alert and oriented to person, place, and time.  Psychiatric:        Mood and Affect: Mood normal.        Behavior: Behavior normal.      Assessment/Plan:  1.  Rectal bleeding, abdominal pain-extensive workup per HPI.  Does have internal hemorrhoids.  Continues to have bleeding every  few days.  Remainder of his workup has been largely unremarkable.  Discussed consideration of repeat hemorrhoid banding.  Patient is averse to being seen by colorectal surgery to discuss surgical options.  It is interesting that he has abdominal burning and pain prior to rectal bleeding.  Also notes clots.  Will check inflammatory markers including see ESR and CRP.  May need to consider repeat full colonoscopy with ileal intubation.  2.  Iliac lymphadenopathy-likely benign though will check CT abdomen pelvis with contrast today for surveillance purposes.  3.  Chronic GERD-improved on Dexilant daily.  Will continue.  4.  Anemia-continue follow-up with hematology  Follow-up in 4 to 6 weeks.   07/03/2022 2:53 PM   Disclaimer: This note was dictated with voice recognition software. Similar sounding words can inadvertently be transcribed and may not be corrected upon review.

## 2022-07-03 NOTE — Telephone Encounter (Signed)
PA approved via [employer] for CT A/P. Auth# [id number], DOS:  07/03/2022-12/30/2022   Per [employer] website for PA CT A/P: "This member's plan does not currently require notification or prior-authorization through the [employer] Notification or Prior-Authorization Program. Please contact a Customer Care Professional at [phone] if you believe the information returned to be in error."

## 2022-07-04 LAB — C-REACTIVE PROTEIN: CRP: 31.5 mg/L — ABNORMAL HIGH (ref <8.0)

## 2022-07-04 LAB — SEDIMENTATION RATE: Sed Rate: 50 mm/h — ABNORMAL HIGH (ref 0–15)

## 2022-07-11 ENCOUNTER — Other Ambulatory Visit: Payer: Self-pay | Admitting: Family Medicine

## 2022-07-11 MED ORDER — ZOLPIDEM TARTRATE 10 MG PO TABS: 10.0000 mg | ORAL_TABLET | Freq: Every evening | ORAL | 0 refills | Status: DC | PRN

## 2022-07-15 ENCOUNTER — Ambulatory Visit (HOSPITAL_COMMUNITY): Payer: Medicare HMO

## 2022-07-17 ENCOUNTER — Other Ambulatory Visit: Payer: Self-pay | Admitting: Family Medicine

## 2022-07-17 ENCOUNTER — Other Ambulatory Visit (HOSPITAL_COMMUNITY): Payer: Self-pay

## 2022-07-20 ENCOUNTER — Ambulatory Visit (HOSPITAL_BASED_OUTPATIENT_CLINIC_OR_DEPARTMENT_OTHER)
Admission: RE | Admit: 2022-07-20 | Discharge: 2022-07-20 | Disposition: A | Discharge status: Home / Self Care | Payer: Medicare HMO | Source: Ambulatory Visit | Attending: Internal Medicine | Admitting: Internal Medicine

## 2022-07-20 DIAGNOSIS — R1033 Periumbilical pain: Secondary | ICD-10-CM | POA: Insufficient documentation

## 2022-07-20 DIAGNOSIS — Q630 Accessory kidney: Secondary | ICD-10-CM | POA: Diagnosis not present

## 2022-07-20 DIAGNOSIS — R599 Enlarged lymph nodes, unspecified: Secondary | ICD-10-CM | POA: Diagnosis not present

## 2022-07-20 DIAGNOSIS — K4020 Bilateral inguinal hernia, without obstruction or gangrene, not specified as recurrent: Secondary | ICD-10-CM | POA: Diagnosis not present

## 2022-07-20 DIAGNOSIS — R590 Localized enlarged lymph nodes: Secondary | ICD-10-CM | POA: Diagnosis not present

## 2022-07-20 DIAGNOSIS — K449 Diaphragmatic hernia without obstruction or gangrene: Secondary | ICD-10-CM | POA: Diagnosis not present

## 2022-07-20 MED ORDER — IOHEXOL 300 MG/ML  SOLN
100.0000 mL | Freq: Once | INTRAMUSCULAR | Status: AC | PRN
  Administered 2022-07-20: 100 mL via INTRAVENOUS

## 2022-07-21 ENCOUNTER — Encounter: Payer: Self-pay | Admitting: Family Medicine

## 2022-07-22 LAB — ALPHA-THALASSEMIA GENOTYPR

## 2022-07-23 ENCOUNTER — Other Ambulatory Visit (HOSPITAL_COMMUNITY): Payer: Self-pay

## 2022-07-23 MED ORDER — MOUNJARO 15 MG/0.5ML ~~LOC~~ SOAJ
15.0000 mg | SUBCUTANEOUS | 3 refills | Status: DC
  Filled 2022-07-23: qty 6, 84d supply, fill #0
  Filled 2022-10-08: qty 6, 84d supply, fill #1
  Filled 2022-12-25: qty 6, 84d supply, fill #2
  Filled 2023-03-17: qty 6, 84d supply, fill #3

## 2022-07-24 ENCOUNTER — Other Ambulatory Visit (HOSPITAL_COMMUNITY): Payer: Self-pay

## 2022-07-26 ENCOUNTER — Other Ambulatory Visit: Payer: Self-pay | Admitting: Family Medicine

## 2022-07-26 ENCOUNTER — Other Ambulatory Visit (HOSPITAL_COMMUNITY): Payer: Self-pay

## 2022-07-26 ENCOUNTER — Other Ambulatory Visit: Payer: Self-pay | Admitting: Gastroenterology

## 2022-07-26 DIAGNOSIS — E1169 Type 2 diabetes mellitus with other specified complication: Secondary | ICD-10-CM

## 2022-07-26 DIAGNOSIS — K219 Gastro-esophageal reflux disease without esophagitis: Secondary | ICD-10-CM

## 2022-07-26 DIAGNOSIS — R1013 Epigastric pain: Secondary | ICD-10-CM

## 2022-07-26 MED ORDER — OXYCODONE HCL 10 MG PO TABS
10.0000 mg | ORAL_TABLET | Freq: Three times a day (TID) | ORAL | 0 refills | Status: DC
  Filled 2022-07-26 – 2022-07-29 (×2): qty 180, 30d supply, fill #0

## 2022-07-26 NOTE — Telephone Encounter (Signed)
Requested Prescriptions  Pending Prescriptions Disp Refills   atorvastatin (LIPITOR) 10 MG tablet [Pharmacy Med Name: ATORVASTATIN 10MG  TABLETS] 90 tablet 3    Sig: TAKE 1 TABLET(10 MG) BY MOUTH DAILY     Cardiovascular:  Antilipid - Statins Failed - 07/26/2022  8:10 AM      Failed - Valid encounter within last 12 months    Recent Outpatient Visits           1 year ago Type 2 diabetes mellitus with hyperglycemia, without long-term current use of insulin (HCC)   [facility] Pickard, Priscille Heidelberg, MD   1 year ago Acute midline low back pain, unspecified whether sciatica present   [facility] [name], MD   2 years ago Depression, major, single episode, moderate (HCC)   [facility] [name], MD   2 years ago SOB (shortness of breath) on exertion   [facility] Tanya Nones, Priscille Heidelberg, MD   2 years ago Upper respiratory tract infection, unspecified type   [facility] Cathlean Marseilles A, NP              Failed - Lipid Panel in normal range within the last 12 months    Cholesterol, Total  Date Value Ref Range Status  05/18/2020 221 (H) 100 - 199 mg/dL Final   Cholesterol  Date Value Ref Range Status  07/02/2022 151 <200 mg/dL Final   LDL Cholesterol (Calc)  Date Value Ref Range Status  07/02/2022 88 mg/dL (calc) Final    Comment:    Reference range: <100 . Desirable range <100 mg/dL for primary prevention;   <70 mg/dL for patients with CHD or diabetic patients  with > or = 2 CHD risk factors. Marland Kitchen LDL-C is now calculated using the Martin-Hopkins  calculation, which is a validated novel method providing  better accuracy than the Friedewald equation in the  estimation of LDL-C.  Horald Pollen et al. Lenox Ahr. 1610;960(45): 2061-2068  (http://education.[employer].com/faq/FAQ164)    HDL  Date Value Ref Range Status  07/02/2022 43 > OR = 40 mg/dL Final  40/98/1191 53 >47 mg/dL Final    Triglycerides  Date Value Ref Range Status  07/02/2022 108 <150 mg/dL Final         Passed - Patient is not pregnant

## 2022-07-28 NOTE — Progress Notes (Signed)
Ricardo [address] [place], [place] [address]   CLINIC:  Medical Oncology/Hematology  PCP:  Ricardo Brooks, MD [address] [place] [address] [phone]   REASON FOR VISIT: Microcytic anemia  INTERVAL HISTORY:   Ricardo Lopez 47 y.o. male returns for routine follow-up of microcytic anemia.  He was last seen by Ricardo Brenner PA-C on 04/23/2022.    He received IV Feraheme x 1 on 04/29/2022, but did not notice any changes after.  His ice pica resolved, but he continues to have significant chronic fatigue.  He continues to have hematochezia, which was initially thought to be from hemorrhoids but has persisted despite banding.  He is taking iron tablet daily.  He denies any symptoms of pain crises.  He has 60% energy and 70% appetite.   His weight has been stable since his last visit.  ASSESSMENT & PLAN:  1.   Microcytic anemia, sickle cell trait + alpha thalassemia trait - He reports that he has been anemic all his life.  No prior history of blood transfusion. - Personal history of sickle cell trait. - EGD (04/15/2022): Gastritis, otherwise normal esophagus and duodenum. - Flexible sigmoidoscopy (04/15/2022): Internal hemorrhoids.  He is being considered for referral to colorectal surgery as he has not responded to conservative measures / hemorrhoid banding. - Hematology workup (04/02/2022): Hemoglobin fractionation cascade/hemoglobin solubility confirms sickle cell trait, but with Hgb-S observed at lower concentration than expected, which suggests possible sickle cell trait compounded by alpha thalassemia, or sickle cell trait with iron deficiency anemia. Normal copper, folate, B12, MMA. Iron panel (03/22/2022) with ferritin 153, iron saturation 17% Negative DAT.  Reticulocytes 1.2%.  Normal LDH. Mildly elevated kappa free light chains 27.6, normal lambda 16.8, normal FLC ratio 1.64.  SPEP normal. Alpha thalassemia DNA analysis consistent with carrier of  alpha thalassemia single gene deviation - Received IV Feraheme x 1 on 04/29/2022. - Most recent labs (06/28/2022): Hgb 10.2/MCV 71.4 (no significant change after IV iron).  Ferritin 197, iron saturation 23%. - Persistent hematochezia, which was initially thought to be from hemorrhoids but has persisted despite banding.  Reports ice pica. - She denies any symptoms of pain crises. - He is taking iron tablet daily for the past 3 years. - Microcytic anemia primarily from sickle cell trait, alpha thalassemia trait, and iron deficiency related to rectal bleeding  - Discussed with patient that patients with sickle cell trait are at increased risk of chronic kidney disease. 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  Left kidney is normal in location, no suspicious masses identified.  No evidence of hydronephrosis. - PLAN: Continue daily iron tablet, but recommended changing to ferrous bisglycinate for better tolerance. - We will recheck CBC/D, CMP, iron panel in 4 months  - If any significant worsening of creatinine in the future, we will refer to nephrology for further management. - Will recheck free light chains, SPEP, and immunofixation at next visit  to rule out developing plasma cell dyscrasia.    2.  Social/family history: - He is currently on disability since long COVID (initial diagnosis 09/28/2019), which left him on continuous oxygen via nasal cannula.  He worked as a Architect prior to disability.  Non-smoker. - Mother has anemia.  Maternal grandmother had sickle cell trait.  Son has sickle cell trait.  Maternal  grandfather had lung cancer.    PLAN SUMMARY: >> Labs in 4 months = CBC/D, CMP, ferritin, iron/TIBC, light chains, SPEP, immunofixation >> OFFICE visit in 4 months (1 week after labs)      REVIEW OF  SYSTEMS:  Review of Systems  Constitutional:  Positive for fatigue. Negative for appetite change, chills, diaphoresis, fever and unexpected weight change.  HENT:   Positive for trouble swallowing. Negative for lump/mass and nosebleeds.   Eyes:  Negative for eye problems.  Respiratory:  Positive for shortness of breath (2L supplemental O2). Negative for cough and hemoptysis.   Cardiovascular:  Positive for chest pain (at times, none today). Negative for leg swelling and palpitations.  Gastrointestinal:  Positive for abdominal pain and nausea. Negative for blood in stool, constipation, diarrhea and vomiting.  Genitourinary:  Negative for hematuria.   Musculoskeletal:  Positive for arthralgias.  Skin: Negative.   Neurological:  Positive for dizziness, headaches and numbness. Negative for light-headedness.  Hematological:  Does not bruise/bleed easily.  Psychiatric/Behavioral:  Positive for sleep disturbance. The patient is nervous/anxious.      PHYSICAL EXAM:  ECOG PERFORMANCE STATUS: 2 - Symptomatic, <50% confined to bed  There were no vitals filed for this visit. There were no vitals filed for this visit. Physical Exam Constitutional:      Appearance: Normal appearance. He is morbidly obese.     Comments: Nasal cannula in place (2 L supplemental O2)   Cardiovascular:     Heart sounds: Normal heart sounds.  Pulmonary:     Breath sounds: Normal breath sounds. Decreased air movement present.  Neurological:     General: No focal deficit present.     Mental Status: Mental status is at baseline.  Psychiatric:        Behavior: Behavior normal. Behavior is cooperative.     PAST MEDICAL/SURGICAL HISTORY:  Past Medical History:  Diagnosis Date   Anemia    Anxiety and depression    B12 deficiency    Back pain    COVID    9/21- hypoxia requiring oxygen   Diabetes mellitus without complication (HCC)    Frequency of urination    GERD (gastroesophageal reflux disease)    Gluteal  abscess    ecoli after covid hospitalization 2021   Headache    Hypertension    Obesity    Scar tissue    Scar Tissue on lungs due to Covid   Sleep apnea    Stomach ulcer    Tachycardia    Thoracic aortic aneurysm [facility])    Past Surgical History:  Procedure Laterality Date   BIOPSY  05/17/2021   Procedure: BIOPSY;  Surgeon: Rachael Fee, MD;  Location: [facility];  Service: Gastroenterology;;  EGD and COLON   COLONOSCOPY WITH PROPOFOL N/A 05/17/2021   Surgeon: Rachael Fee, MD; mild erythema and granularity of the colonic mucosa with benign biopsies, internal hemorrhoids suspected to be etiology of rectal bleeding.   ESOPHAGOGASTRODUODENOSCOPY (EGD) WITH PROPOFOL N/A 05/17/2021   Surgeon: Rachael Fee, MD; very mild, nonspecific distal gastritis s/p biopsied, otherwise normal exam.  Gastric biopsy was benign, negative for H. pylori.   ESOPHAGOGASTRODUODENOSCOPY (EGD) WITH PROPOFOL N/A 04/15/2022   Procedure: ESOPHAGOGASTRODUODENOSCOPY (EGD) WITH PROPOFOL;  Surgeon: Lanelle Bal, DO;  [facility];  Service: Endoscopy;  Laterality: N/A;   FLEXIBLE SIGMOIDOSCOPY N/A 04/15/2022   Procedure: FLEXIBLE SIGMOIDOSCOPY;  Surgeon: Lanelle Bal, DO;  [facility];  Service: Endoscopy;  Laterality: N/A;  100pm,  asa 3   GIVENS CAPSULE STUDY N/A 11/16/2021   Surgeon: Lanelle Bal, DO; poor and only able to identify first gastric image.   GIVENS CAPSULE STUDY N/A 04/15/2022   Procedure: GIVENS CAPSULE STUDY;  Surgeon: Lanelle Bal, DO;  Location: [facility];  Service: Endoscopy;  Laterality: N/A;    SOCIAL HISTORY:  Social History   Socioeconomic History   Marital status: Married    Spouse name: Not on file   Number of children: Not on file   Years of education: Not on file   Highest education level: Not on file  Occupational History   Occupation: stay at home spouse  Tobacco Use   Smoking status: Never   Smokeless tobacco: Never   Vaping Use   Vaping Use: Never used  Substance and Sexual Activity   Alcohol use: Not Currently    Comment: occasional   Drug use: Not Currently    Types: Marijuana    Comment: occasionally   Sexual activity: Yes  Other Topics Concern   Not on file  Social History Narrative   Lives at home with wife and two children.  Four children in all.     Social Determinants of Health   Financial Resource Strain: Not on file  Food Insecurity: No Food Insecurity (04/02/2022)   Hunger Vital Sign    Worried About Running Out of Food in the Last Year: Never true    Ran Out of Food in the Last Year: Never true  Transportation Needs: No Transportation Needs (04/02/2022)   PRAPARE - Administrator, Civil Service (Medical): No    Lack of Transportation (Non-Medical): No  Physical Activity: Not on file  Stress: Not on file  Social Connections: Not on file  Intimate Partner Violence: Not At Risk (04/02/2022)   Humiliation, Afraid, Rape, and Kick questionnaire    Fear of Current or Ex-Partner: No    Emotionally Abused: No    Physically Abused: No    Sexually Abused: No    FAMILY HISTORY:  Family History  Problem Relation Age of Onset   Diabetes Mother    Hypertension Mother    Hyperlipidemia Mother    Thyroid disease Mother    Diabetes Father    Hyperlipidemia Father    Hypertension Father    Depression Father    Anxiety disorder Father    Sleep apnea Father    Colon cancer Neg Hx    Stomach cancer Neg Hx     CURRENT MEDICATIONS:  Outpatient Encounter Medications as of 07/29/2022  Medication Sig   acetaminophen (TYLENOL) 325 MG tablet Take 650 mg by mouth every 6 (six) hours as needed for moderate pain.   albuterol (VENTOLIN HFA) 108 (90 Base) MCG/ACT inhaler Inhale 2 puffs into the lungs every 4 (four) hours as needed for wheezing or shortness of breath.   ascorbic acid (VITAMIN C) 500 MG tablet Take 1 tablet (500 mg total) by mouth daily.   atorvastatin (LIPITOR) 10 MG  tablet TAKE 1 TABLET(10 MG) BY MOUTH DAILY   Blood Glucose Monitoring Suppl (ONE TOUCH ULTRA 2) w/Device KIT 1 each by Does not apply route 3 (three) times daily.   buPROPion (WELLBUTRIN XL) 150 MG 24 hr tablet TAKE 1 TABLET(150 MG) BY MOUTH DAILY   cetirizine (ZYRTEC) 10 MG tablet TAKE 1 TABLET BY MOUTH DAILY   Cholecalciferol (VITAMIN D) 50 MCG (2000 UT) tablet Take 2,000 Units by mouth daily.   ciclopirox (PENLAC) 8 % solution  Apply topically at bedtime. Apply over nail and surrounding skin. Apply daily over previous coat. After seven (7) days, may remove with alcohol and continue cycle.   clotrimazole-betamethasone (LOTRISONE) cream APPLY TOPICALLY TO THE AFFECTED AREA TWICE DAILY (Patient taking differently: Apply 1 Application topically 2 (two) times daily as needed (irritation).)   dexlansoprazole (DEXILANT) 60 MG capsule TAKE 1 CAPSULE(60 MG) BY MOUTH DAILY   ferrous sulfate (FEROSUL) 325 (65 FE) MG tablet TAKE 1 TABLET BY MOUTH EVERY MORNING   fluticasone (FLONASE) 50 MCG/ACT nasal spray Place 2 sprays into both nostrils daily as needed for allergies.   glucose blood (ONETOUCH ULTRA) test strip 1 each by Other route 3 (three) times daily. Use to check blood sugar three times daily as instructed   hydrocortisone (ANUSOL-HC) 25 MG suppository Place 1 suppository (25 mg total) rectally 2 (two) times daily.   Lancets MISC Check fbs daily   metoprolol succinate (TOPROL-XL) 100 MG 24 hr tablet TAKE 1 TABLET(100 MG) BY MOUTH DAILY   Multiple Vitamin (MULTIVITAMIN) tablet Take 1 tablet by mouth daily.   ofloxacin (OCUFLOX) 0.3 % ophthalmic solution Place 1 drop into eye(s) 4 (four) times daily as needed for eye infection.   Oxycodone HCl 10 MG TABS Take 1-2 tablets (10-20 mg total) by mouth 3 (three) times daily as needed.   Oxycodone HCl 10 MG TABS Take 1-2 tablets (10-20 mg total) by mouth 3 (three) times daily as needed.   Oxycodone HCl 10 MG TABS Take 1-2 tablets (10-20 mg total) by mouth 3  (three) times daily as needed   potassium chloride SA (KLOR-CON M) 20 MEQ tablet TAKE 2 TABLETS BY MOUTH DAILY   prazosin (MINIPRESS) 2 MG capsule TAKE 1 CAPSULE(2 MG) BY MOUTH AT BEDTIME   sodium chloride (OCEAN) 0.65 % SOLN nasal spray Place 1 spray into both nostrils as needed for congestion.   sucralfate (CARAFATE) 1 g tablet TAKE 1 TABLET WITH MEALS AND AT BEDTIME MAX OF 4/DAY (Patient taking differently: Take 1 g by mouth 3 (three) times daily as needed.)   tirzepatide (MOUNJARO) 15 MG/0.5ML Pen Inject 15 mg into the skin once a week.   venlafaxine XR (EFFEXOR XR) 75 MG 24 hr capsule Take 3 capsules (225 mg total) by mouth daily with breakfast.   zolpidem (AMBIEN) 10 MG tablet Take 1 tablet (10 mg total) by mouth at bedtime as needed for sleep.   No facility-administered encounter medications on file as of 07/29/2022.    ALLERGIES:  No Known Allergies  LABORATORY DATA:  I have reviewed the labs as listed.  CBC    Component Value Date/Time   WBC 9.9 06/28/2022 1221   RBC 4.83 06/28/2022 1221   HGB 10.2 (L) 06/28/2022 1221   HGB 10.2 (L) 05/18/2020 1357   HCT 34.5 (L) 06/28/2022 1221   HCT 34.9 (L) 05/18/2020 1357   PLT 235 06/28/2022 1221   MCV 71.4 (L) 06/28/2022 1221   MCV 69 (L) 05/18/2020 1357   MCH 21.1 (L) 06/28/2022 1221   MCHC 29.6 (L) 06/28/2022 1221   RDW 14.9 06/28/2022 1221   RDW 16.0 (H) 05/18/2020 1357   LYMPHSABS 3.0 06/28/2022 1221   LYMPHSABS 2.0 05/18/2020 1357   MONOABS 0.7 06/28/2022 1221   EOSABS 0.1 06/28/2022 1221   EOSABS 0.1 05/18/2020 1357   BASOSABS 0.0 06/28/2022 1221   BASOSABS 0.0 05/18/2020 1357      Latest Ref Rng & Units 07/02/2022    9:18 AM 06/28/2022   12:21 PM  03/22/2022    3:23 PM  CMP  Glucose 65 - 99 mg/dL 272  536  89   BUN 7 - 25 mg/dL 14  13  14    Creatinine 0.60 - 1.29 mg/dL 6.44  0.34  7.42   Sodium 135 - 146 mmol/L 141  140  142   Potassium 3.5 - 5.3 mmol/L 4.6  4.2  4.3   Chloride 98 - 110 mmol/L 103  104  103   CO2 20  - 32 mmol/L 30  29  29    Calcium 8.6 - 10.3 mg/dL 9.4  9.1  9.6   Total Protein 6.1 - 8.1 g/dL 6.9  7.7  7.5   Total Bilirubin 0.2 - 1.2 mg/dL 0.2  0.4  0.3   Alkaline Phos 38 - 126 U/L  80    AST 10 - 40 U/L 17  16  14    ALT 9 - 46 U/L 23  21  11      DIAGNOSTIC IMAGING:  I have independently reviewed the relevant imaging and discussed with the patient.   WRAP UP:  All questions were answered. The patient knows to call the clinic with any problems, questions or concerns.  Medical decision making: Moderate  Time spent on visit: I spent 20 minutes counseling the patient face to face. The total time spent in the appointment was 30 minutes and more than 50% was on counseling.  Carnella Guadalajara, PA-C  07/29/22 6:14 PM

## 2022-07-29 ENCOUNTER — Other Ambulatory Visit: Payer: Self-pay

## 2022-07-29 ENCOUNTER — Inpatient Hospital Stay: Payer: 59 | Attending: Hematology | Admitting: Physician Assistant

## 2022-07-29 ENCOUNTER — Other Ambulatory Visit (HOSPITAL_COMMUNITY): Payer: Self-pay

## 2022-07-29 VITALS — BP 120/81 | HR 95 | Temp 98.5°F | Resp 20 | Ht 68.0 in | Wt >= 6400 oz

## 2022-07-29 DIAGNOSIS — R5382 Chronic fatigue, unspecified: Secondary | ICD-10-CM | POA: Diagnosis not present

## 2022-07-29 DIAGNOSIS — D500 Iron deficiency anemia secondary to blood loss (chronic): Secondary | ICD-10-CM

## 2022-07-29 DIAGNOSIS — D563 Thalassemia minor: Secondary | ICD-10-CM | POA: Insufficient documentation

## 2022-07-29 DIAGNOSIS — D509 Iron deficiency anemia, unspecified: Secondary | ICD-10-CM | POA: Diagnosis not present

## 2022-07-29 DIAGNOSIS — R768 Other specified abnormal immunological findings in serum: Secondary | ICD-10-CM | POA: Diagnosis not present

## 2022-07-29 NOTE — Patient Instructions (Addendum)
[name] **VISIT SUMMARY & IMPORTANT INSTRUCTIONS **   You were seen today by Rojelio Brenner PA-C for your anemia.  Your anemia is due to a combination of sickle cell trait and alpha thalassemia trait.  You also have some anemia from your blood loss and iron deficiency. You do not need any treatment from your anemia at this time. You can change your iron tablet to FERROUS BISGLYCINATE, since this tends to be gentler on your stomach. We will continue to monitor your labs and see you for a follow up visit in 4 months.  FOLLOW-UP APPOINTMENT: Labs and office visit in 4 months  ** Thank you for trusting me with your healthcare!  I strive to provide all of my patients with quality care at each visit.  If you receive a survey for this visit, I would be so grateful to you for taking the time to provide feedback.  Thank you in advance!  ~ [name]                   Dr. Doreatha Massed   &   Rojelio Brenner, PA-C   - - - - - - - - - - - - - - - - - -    Thank you for choosing [facility] at [facility] to provide your oncology and hematology care.  To afford each patient quality time with our provider, please arrive at least 15 minutes before your scheduled appointment time.   If you have a lab appointment with the Cancer Center please come in thru the Main Entrance and check in at the main information desk.  You need to re-schedule your appointment should you arrive 10 or more minutes late.  We strive to give you quality time with our providers, and arriving late affects you and other patients whose appointments are after yours.  Also, if you no show three or more times for appointments you may be dismissed from the clinic at the providers discretion.     Again, thank you for choosing [facility].  Our hope is that these requests will decrease the amount of time that you wait before being seen by our physicians.        _____________________________________________________________  Should you have questions after your visit to [facility], please contact our office at [phone] and follow the prompts.  Our office hours are 8:00 a.m. and 4:30 p.m. Monday - Friday.  Please note that voicemails left after 4:00 p.m. may not be returned until the following business day.  We are closed weekends and major holidays.  You do have access to a nurse 24-7, just call the main number to the clinic [phone] and do not press any options, hold on the line and a nurse will answer the phone.    For prescription refill requests, have your pharmacy contact our office and allow 72 hours.

## 2022-07-30 ENCOUNTER — Encounter: Payer: Self-pay | Admitting: Family Medicine

## 2022-07-30 ENCOUNTER — Encounter: Payer: Self-pay | Admitting: Hematology

## 2022-07-30 ENCOUNTER — Other Ambulatory Visit (HOSPITAL_COMMUNITY): Payer: Self-pay

## 2022-07-31 ENCOUNTER — Other Ambulatory Visit: Payer: Self-pay

## 2022-07-31 DIAGNOSIS — E1165 Type 2 diabetes mellitus with hyperglycemia: Secondary | ICD-10-CM

## 2022-07-31 MED ORDER — ONETOUCH ULTRA 2 W/DEVICE KIT
1.0000 | PACK | Freq: Three times a day (TID) | 1 refills | Status: AC
Start: 2022-07-31 — End: ?

## 2022-08-02 ENCOUNTER — Other Ambulatory Visit: Payer: Self-pay | Admitting: Family Medicine

## 2022-08-02 DIAGNOSIS — E1165 Type 2 diabetes mellitus with hyperglycemia: Secondary | ICD-10-CM

## 2022-08-02 NOTE — Telephone Encounter (Signed)
Prescription Request  08/02/2022  LOV: 07/02/2022  What is the name of the medication or equipment?   Blood Glucose Monitoring Suppl (ONE TOUCH ULTRA 2) w/Device KIT  *PATIENT NEEDS PRESCRIPTION FOR LANCETS AND TEST STRIPS*  Have you contacted your pharmacy to request a refill? Yes   Which pharmacy would you like this sent to?  [id number] - [place], [place] - [address] [place] [address] Phone: [phone] Fax: [phone]    Patient notified that their request is being sent to the clinical staff for review and that they should receive a response within 2 business days.   Please advise at pharmacist.

## 2022-08-02 NOTE — Telephone Encounter (Signed)
Requested medication (s) are due for refill today: yes  Requested medication (s) are on the active medication list: not all medications requested  Last refill:  multiple dates  Future visit scheduled: no  Notes to clinic:  Patient also requesting test strips, not on current medication list, routing for approval.     Requested Prescriptions  Pending Prescriptions Disp Refills   Lancets MISC 100 each 11    Sig: Check fbs daily     Endocrinology: Diabetes - Testing Supplies Failed - 08/02/2022 11:50 AM      Failed - Valid encounter within last 12 months    Recent Outpatient Visits           1 year ago Type 2 diabetes mellitus with hyperglycemia, without long-term current use of insulin (HCC)   [facility] Pickard, Priscille Heidelberg, MD   1 year ago Acute midline low back pain, unspecified whether sciatica present   [facility] Medicine [name], MD   2 years ago Depression, major, single episode, moderate (HCC)   [facility] Medicine [name], MD   2 years ago SOB (shortness of breath) on exertion   [facility] Medicine Tanya Nones, Priscille Heidelberg, MD   2 years ago Upper respiratory tract infection, unspecified type   [facility] Medicine Cathlean Marseilles A, NP               Blood Glucose Monitoring Suppl (ONE TOUCH ULTRA 2) w/Device KIT 1 kit 1    Sig: 1 each by Does not apply route 3 (three) times daily.     Endocrinology: Diabetes - Testing Supplies Failed - 08/02/2022 11:50 AM      Failed - Valid encounter within last 12 months    Recent Outpatient Visits           1 year ago Type 2 diabetes mellitus with hyperglycemia, without long-term current use of insulin (HCC)   [facility] Medicine [name], MD   1 year ago Acute midline low back pain, unspecified whether sciatica present   [facility] Medicine [name], MD   2 years ago Depression, major, single episode, moderate (HCC)    [facility] [name], MD   2 years ago SOB (shortness of breath) on exertion   [facility] Medicine Tanya Nones Priscille Heidelberg, MD   2 years ago Upper respiratory tract infection, unspecified type   [facility] Medicine Valentino Nose, NP

## 2022-08-06 ENCOUNTER — Other Ambulatory Visit: Payer: Self-pay

## 2022-08-06 DIAGNOSIS — E1165 Type 2 diabetes mellitus with hyperglycemia: Secondary | ICD-10-CM

## 2022-08-06 MED ORDER — ACCU-CHEK SOFTCLIX LANCETS MISC
12 refills | Status: AC
Start: 2022-08-06 — End: ?

## 2022-08-06 MED ORDER — GLUCOSE BLOOD VI STRP
ORAL_STRIP | 12 refills | Status: AC
Start: 2022-08-06 — End: ?

## 2022-08-09 ENCOUNTER — Other Ambulatory Visit: Payer: Self-pay | Admitting: Family Medicine

## 2022-08-09 ENCOUNTER — Other Ambulatory Visit: Payer: Self-pay | Admitting: Cardiology

## 2022-08-09 NOTE — Telephone Encounter (Signed)
Requested medications are due for refill today.  yes  Requested medications are on the active medications list.  yes  Last refill. 07/11/2022 #30 0 rf  Future visit scheduled.   no  Notes to clinic.  Refill not delegated.    Requested Prescriptions  Pending Prescriptions Disp Refills   zolpidem (AMBIEN) 10 MG tablet [Pharmacy Med Name: ZOLPIDEM 10MG  TABLETS] 30 tablet     Sig: TAKE 1 TABLET(10 MG) BY MOUTH AT BEDTIME AS NEEDED FOR SLEEP     Not Delegated - Psychiatry:  Anxiolytics/Hypnotics Failed - 08/09/2022 11:20 AM      Failed - This refill cannot be delegated      Failed - Urine Drug Screen completed in last 360 days      Failed - Valid encounter within last 6 months    Recent Outpatient Visits           1 year ago Type 2 diabetes mellitus with hyperglycemia, without long-term current use of insulin (HCC)   [facility] Medicine [name], MD   1 year ago Acute midline low back pain, unspecified whether sciatica present   [facility] Medicine [name], MD   2 years ago Depression, major, single episode, moderate (HCC)   [facility] Medicine [name], MD   2 years ago SOB (shortness of breath) on exertion   [facility] Medicine Pickard, Priscille Heidelberg, MD   2 years ago Upper respiratory tract infection, unspecified type   [facility] Medicine Valentino Nose, NP

## 2022-08-12 ENCOUNTER — Telehealth: Payer: Self-pay | Admitting: *Deleted

## 2022-08-12 NOTE — Telephone Encounter (Signed)
[facility]  TCS asa 3 w/Dr.Carver

## 2022-08-13 ENCOUNTER — Encounter: Payer: Self-pay | Admitting: *Deleted

## 2022-08-13 ENCOUNTER — Other Ambulatory Visit: Payer: Self-pay | Admitting: *Deleted

## 2022-08-13 MED ORDER — PEG 3350-KCL-NA BICARB-NACL 420 G PO SOLR: 4000.0000 mL | Freq: Once | ORAL | 0 refills | Status: AC

## 2022-08-13 NOTE — Telephone Encounter (Signed)
Pt has been scheduled for 09/16/22. Instructions mailed and prep sent to the pharmacy.

## 2022-08-19 ENCOUNTER — Encounter: Payer: Self-pay | Admitting: *Deleted

## 2022-08-22 ENCOUNTER — Telehealth: Payer: Self-pay | Admitting: Cardiology

## 2022-08-22 MED ORDER — METOPROLOL SUCCINATE ER 100 MG PO TB24: 100.0000 mg | ORAL_TABLET | Freq: Every day | ORAL | 1 refills | Status: DC

## 2022-08-22 NOTE — Telephone Encounter (Signed)
*  STAT* If patient is at the pharmacy, call can be transferred to refill team.   1. Which medications need to be refilled? (please list name of each medication and dose if known)   metoprolol succinate (TOPROL-XL) 100 MG 24 hr tablet   2. Would you like to learn more about the convenience, safety, & potential cost savings by using the [facility]?   3. Are you open to using the [facility] (Type [facility]. ).  4. Which pharmacy/location (including street and city if local pharmacy) is medication to be sent to?  [id number] - [place], [place] - [address]   5. Do they need a 30 day or 90 day supply?   90 day  Wife stated patient is almost out of this medication.

## 2022-08-22 NOTE — Telephone Encounter (Signed)
Pt's medication was sent to pt's pharmacy as requested. Confirmation received.  °

## 2022-08-23 ENCOUNTER — Other Ambulatory Visit (HOSPITAL_COMMUNITY): Payer: Self-pay

## 2022-08-23 MED ORDER — OXYCODONE HCL 10 MG PO TABS
10.0000 mg | ORAL_TABLET | Freq: Three times a day (TID) | ORAL | 0 refills | Status: DC | PRN
Start: 2022-08-28 — End: 2022-12-02
  Filled 2022-08-23 – 2022-08-28 (×2): qty 180, 30d supply, fill #0

## 2022-08-25 ENCOUNTER — Other Ambulatory Visit: Payer: Self-pay | Admitting: Cardiology

## 2022-08-28 ENCOUNTER — Other Ambulatory Visit: Payer: Self-pay

## 2022-08-28 ENCOUNTER — Other Ambulatory Visit (HOSPITAL_COMMUNITY): Payer: Self-pay

## 2022-08-28 ENCOUNTER — Other Ambulatory Visit (HOSPITAL_BASED_OUTPATIENT_CLINIC_OR_DEPARTMENT_OTHER): Payer: Self-pay

## 2022-09-05 DIAGNOSIS — J449 Chronic obstructive pulmonary disease, unspecified: Secondary | ICD-10-CM | POA: Diagnosis not present

## 2022-09-05 DIAGNOSIS — M545 Low back pain, unspecified: Secondary | ICD-10-CM | POA: Diagnosis not present

## 2022-09-05 DIAGNOSIS — F419 Anxiety disorder, unspecified: Secondary | ICD-10-CM | POA: Diagnosis not present

## 2022-09-05 DIAGNOSIS — I10 Essential (primary) hypertension: Secondary | ICD-10-CM | POA: Diagnosis not present

## 2022-09-05 DIAGNOSIS — K5900 Constipation, unspecified: Secondary | ICD-10-CM | POA: Diagnosis not present

## 2022-09-05 DIAGNOSIS — E785 Hyperlipidemia, unspecified: Secondary | ICD-10-CM | POA: Diagnosis not present

## 2022-09-05 DIAGNOSIS — M1990 Unspecified osteoarthritis, unspecified site: Secondary | ICD-10-CM | POA: Diagnosis not present

## 2022-09-05 DIAGNOSIS — F329 Major depressive disorder, single episode, unspecified: Secondary | ICD-10-CM | POA: Diagnosis not present

## 2022-09-05 DIAGNOSIS — K219 Gastro-esophageal reflux disease without esophagitis: Secondary | ICD-10-CM | POA: Diagnosis not present

## 2022-09-05 DIAGNOSIS — J309 Allergic rhinitis, unspecified: Secondary | ICD-10-CM | POA: Diagnosis not present

## 2022-09-05 DIAGNOSIS — G4700 Insomnia, unspecified: Secondary | ICD-10-CM | POA: Diagnosis not present

## 2022-09-11 ENCOUNTER — Encounter (HOSPITAL_COMMUNITY)
Admission: RE | Admit: 2022-09-11 | Discharge: 2022-09-11 | Disposition: A | Discharge status: Home / Self Care | Payer: Medicare HMO | Source: Ambulatory Visit | Attending: Internal Medicine | Admitting: Internal Medicine

## 2022-09-11 ENCOUNTER — Encounter (HOSPITAL_COMMUNITY): Payer: Self-pay

## 2022-09-11 HISTORY — DX: Dependence on supplemental oxygen: Z99.81

## 2022-09-11 HISTORY — DX: Chronic obstructive pulmonary disease, unspecified: J44.9

## 2022-09-12 ENCOUNTER — Encounter: Payer: Self-pay | Admitting: *Deleted

## 2022-09-16 ENCOUNTER — Ambulatory Visit (HOSPITAL_COMMUNITY): Payer: 59 | Admitting: Certified Registered"

## 2022-09-16 ENCOUNTER — Ambulatory Visit (HOSPITAL_BASED_OUTPATIENT_CLINIC_OR_DEPARTMENT_OTHER): Payer: 59 | Admitting: Certified Registered"

## 2022-09-16 ENCOUNTER — Ambulatory Visit (HOSPITAL_COMMUNITY)
Admission: RE | Admit: 2022-09-16 | Discharge: 2022-09-16 | Disposition: A | Discharge status: Home / Self Care | Payer: 59 | Attending: Internal Medicine | Admitting: Internal Medicine

## 2022-09-16 ENCOUNTER — Encounter (HOSPITAL_COMMUNITY)
Admission: RE | Disposition: A | Discharge status: Home / Self Care | Payer: Self-pay | Source: Home / Self Care | Attending: Internal Medicine

## 2022-09-16 DIAGNOSIS — K625 Hemorrhage of anus and rectum: Secondary | ICD-10-CM | POA: Diagnosis not present

## 2022-09-16 DIAGNOSIS — G4730 Sleep apnea, unspecified: Secondary | ICD-10-CM | POA: Diagnosis not present

## 2022-09-16 DIAGNOSIS — Z9981 Dependence on supplemental oxygen: Secondary | ICD-10-CM | POA: Insufficient documentation

## 2022-09-16 DIAGNOSIS — K219 Gastro-esophageal reflux disease without esophagitis: Secondary | ICD-10-CM | POA: Diagnosis not present

## 2022-09-16 DIAGNOSIS — E119 Type 2 diabetes mellitus without complications: Secondary | ICD-10-CM | POA: Diagnosis not present

## 2022-09-16 DIAGNOSIS — Z6841 Body Mass Index (BMI) 40.0 and over, adult: Secondary | ICD-10-CM | POA: Insufficient documentation

## 2022-09-16 DIAGNOSIS — Z7985 Long-term (current) use of injectable non-insulin antidiabetic drugs: Secondary | ICD-10-CM | POA: Diagnosis not present

## 2022-09-16 DIAGNOSIS — I10 Essential (primary) hypertension: Secondary | ICD-10-CM | POA: Insufficient documentation

## 2022-09-16 DIAGNOSIS — R1084 Generalized abdominal pain: Secondary | ICD-10-CM | POA: Diagnosis not present

## 2022-09-16 DIAGNOSIS — J449 Chronic obstructive pulmonary disease, unspecified: Secondary | ICD-10-CM | POA: Insufficient documentation

## 2022-09-16 HISTORY — PX: COLONOSCOPY WITH PROPOFOL: SHX5780

## 2022-09-16 HISTORY — PX: BIOPSY: SHX5522

## 2022-09-16 LAB — GLUCOSE, CAPILLARY: Glucose-Capillary: 105 mg/dL — ABNORMAL HIGH (ref 70–99)

## 2022-09-16 SURGERY — COLONOSCOPY WITH PROPOFOL
Anesthesia: General

## 2022-09-16 MED ORDER — GLYCOPYRROLATE PF 0.2 MG/ML IJ SOSY
PREFILLED_SYRINGE | INTRAMUSCULAR | Status: DC | PRN
  Administered 2022-09-16: .1 mg via INTRAVENOUS

## 2022-09-16 MED ORDER — PHENYLEPHRINE 80 MCG/ML (10ML) SYRINGE FOR IV PUSH (FOR BLOOD PRESSURE SUPPORT)
PREFILLED_SYRINGE | INTRAVENOUS | Status: AC
  Filled 2022-09-16: qty 10

## 2022-09-16 MED ORDER — LACTATED RINGERS IV SOLN: INTRAVENOUS | Status: DC | PRN

## 2022-09-16 MED ORDER — PROPOFOL 500 MG/50ML IV EMUL
INTRAVENOUS | Status: DC | PRN
  Administered 2022-09-16: 150 ug/kg/min via INTRAVENOUS

## 2022-09-16 MED ORDER — PROPOFOL 500 MG/50ML IV EMUL
INTRAVENOUS | Status: AC
  Filled 2022-09-16: qty 50

## 2022-09-16 MED ORDER — GLYCOPYRROLATE PF 0.2 MG/ML IJ SOSY
PREFILLED_SYRINGE | INTRAMUSCULAR | Status: AC
  Filled 2022-09-16: qty 1

## 2022-09-16 MED ORDER — LIDOCAINE HCL (PF) 2 % IJ SOLN
INTRAMUSCULAR | Status: AC
  Filled 2022-09-16: qty 5

## 2022-09-16 MED ORDER — PROPOFOL 10 MG/ML IV BOLUS
INTRAVENOUS | Status: DC | PRN
  Administered 2022-09-16: 100 mg via INTRAVENOUS

## 2022-09-16 MED ORDER — LIDOCAINE HCL (CARDIAC) PF 100 MG/5ML IV SOSY
PREFILLED_SYRINGE | INTRAVENOUS | Status: DC | PRN
  Administered 2022-09-16: 80 mg via INTRAVENOUS

## 2022-09-16 MED ORDER — PHENYLEPHRINE 80 MCG/ML (10ML) SYRINGE FOR IV PUSH (FOR BLOOD PRESSURE SUPPORT)
PREFILLED_SYRINGE | INTRAVENOUS | Status: DC | PRN
  Administered 2022-09-16 (×4): 160 ug via INTRAVENOUS

## 2022-09-16 NOTE — Transfer of Care (Addendum)
Immediate Anesthesia Transfer of Care Note  Patient: Ricardo Lopez.  Procedure(s) Performed: COLONOSCOPY WITH PROPOFOL BIOPSY  Patient Location: Short Stay  Anesthesia Type:General  Level of Consciousness: drowsy and patient cooperative  Airway & Oxygen Therapy: Patient Spontanous Breathing and Patient connected to nasal cannula oxygen  Post-op Assessment: Report given to RN and Post -op Vital signs reviewed and stable  Post vital signs: Reviewed and stable  Last Vitals:  Vitals Value Taken Time  BP 115/68 09/16/22   1135  Temp 36.7 09/16/22   1135  Pulse 88 09/16/22   1135  Resp 18 09/16/22   1135  SpO2 100% 09/16/22   1135    Last Pain:  Vitals:   09/16/22 1059  TempSrc:   PainSc: 4       Patients Stated Pain Goal: 5 (09/16/22 0940)  Complications: No notable events documented.

## 2022-09-16 NOTE — Anesthesia Preprocedure Evaluation (Signed)
Anesthesia Evaluation  Patient identified by MRN, date of birth, ID band Patient awake    Reviewed: Allergy & Precautions, H&P , NPO status , Patient's Chart, lab work & pertinent test results, reviewed documented beta blocker date and time   Airway Mallampati: II  TM Distance: >3 FB Neck ROM: full    Dental no notable dental hx.    Pulmonary neg pulmonary ROS, sleep apnea , pneumonia, COPD,  oxygen dependent   Pulmonary exam normal breath sounds clear to auscultation       Cardiovascular Exercise Tolerance: Good hypertension, negative cardio ROS  Rhythm:regular Rate:Normal     Neuro/Psych  Headaches PSYCHIATRIC DISORDERS Anxiety Depression    negative neurological ROS  negative psych ROS   GI/Hepatic negative GI ROS, Neg liver ROS, PUD,GERD  ,,  Endo/Other  diabetes  Morbid obesity  Renal/GU Renal diseasenegative Renal ROS  negative genitourinary   Musculoskeletal   Abdominal   Peds  Hematology negative hematology ROS (+) Blood dyscrasia, anemia   Anesthesia Other Findings   Reproductive/Obstetrics negative OB ROS                             Anesthesia Physical Anesthesia Plan  ASA: 4  Anesthesia Plan: General   Post-op Pain Management:    Induction:   PONV Risk Score and Plan: Propofol infusion  Airway Management Planned:   Additional Equipment:   Intra-op Plan:   Post-operative Plan:   Informed Consent: I have reviewed the patients History and Physical, chart, labs and discussed the procedure including the risks, benefits and alternatives for the proposed anesthesia with the patient or authorized representative who has indicated his/her understanding and acceptance.     Dental Advisory Given  Plan Discussed with: CRNA  Anesthesia Plan Comments:        Anesthesia Quick Evaluation

## 2022-09-16 NOTE — Op Note (Addendum)
[facility] Patient Name: Ricardo Lopez Procedure Date: 09/16/2022 10:49 AM MRN: [id number] Date of Birth: 08-Jan-1976 Attending MD: Hennie Duos. Marletta Lor , [place], [id number] CSN: [id number] Age: 47 Admit Type: Outpatient Procedure:                Colonoscopy Indications:              Generalized abdominal pain, Rectal bleeding Providers:                Hennie Duos. Marletta Lor, DO, Buel Ream. Thomasena Edis RN, RN,                            Dyann Ruddle, Elinor Parkinson Referring MD:              Medicines:                See the Anesthesia note for documentation of the                            administered medications Complications:            No immediate complications. Estimated Blood Loss:     Estimated blood loss was minimal. Procedure:                Pre-Anesthesia Assessment:                           - The anesthesia plan was to use monitored                            anesthesia care (MAC).                           After obtaining informed consent, the colonoscope                            was passed under direct vision. Throughout the                            procedure, the patient's blood pressure, pulse, and                            oxygen saturations were monitored continuously. The                            [id number]-[id number] ([id number]) was introduced through the                            anus and advanced to the the cecum, identified by                            appendiceal orifice and ileocecal valve. The                            patient tolerated the procedure well. The                            colonoscopy was technically  difficult and complex                            due to inadequate bowel prep, a redundant colon and                            significant looping. Successful completion of the                            procedure was aided by applying abdominal pressure.                            The quality of the bowel preparation was evaluated                             using the BBPS [facility] Bowel Preparation Scale)                            with scores of: Right Colon = 1 (portion of mucosa                            seen, but other areas not well seen due to                            staining, residual stool and/or opaque liquid),                            Transverse Colon = 1 (portion of mucosa seen, but                            other areas not well seen due to staining, residual                            stool and/or opaque liquid) and Left Colon = 1                            (portion of mucosa seen, but other areas not well                            seen due to staining, residual stool and/or opaque                            liquid). The total BBPS score equals 3. The quality                            of the bowel preparation was inadequate. Scope In: 11:04:47 AM Scope Out: 11:28:52 AM Scope Withdrawal Time: 0 hours 18 minutes 7 seconds  Total Procedure Duration: 0 hours 24 minutes 5 seconds  Findings:      Extensive amounts of semi-solid stool was found in the entire colon,       precluding visualization. Lavage of the area was performed using copious       amounts of sterile  water, resulting in incomplete clearance with       continued poor visualization.      Biopsies were taken with a cold forceps in the entire colon for       histology.      Unable to intubate terminal ileum despite numerous attempts due to       significant looping/body habitus. Overall limited exam today given poor       prep. Impression:               - Preparation of the colon was inadequate.                           - Stool in the entire examined colon.                           - Biopsies were taken with a cold forceps for                            histology in the entire colon. Moderate Sedation:      Per Anesthesia Care Recommendation:           - Patient has a contact number available for                            emergencies. The signs and symptoms  of potential                            delayed complications were discussed with the                            patient. Return to normal activities tomorrow.                            Written discharge instructions were provided to the                            patient.                           - Resume previous diet.                           - Continue present medications.                           - Return to GI clinic in 4 weeks. Procedure Code(s):        --- Professional ---                           [phone], Colonoscopy, flexible; with biopsy, single                            or multiple Diagnosis Code(s):        --- Professional ---                           R10.84, Generalized abdominal pain  K62.5, Hemorrhage of anus and rectum CPT copyright 2022 American Medical Association. All rights reserved. The codes documented in this report are preliminary and upon coder review may  be revised to meet current compliance requirements. Hennie Duos. Marletta Lor, DO Hennie Duos. Marletta Lor, DO 09/16/2022 11:38:25 AM This report has been signed electronically. Number of Addenda: 0

## 2022-09-16 NOTE — H&P (Signed)
Primary Care Physician:  [name], MD Primary Gastroenterologist:  Dr. Marletta Lor  Pre-Procedure History & Physical: HPI:  Ricardo Lopez. is a 47 y.o. male is here for a colonoscopy for rectal bleeding and abdominal pain  Past Medical History:  Diagnosis Date   Anemia    Anxiety and depression    B12 deficiency    Back pain    COPD (chronic obstructive pulmonary disease) (HCC)    COVID    9/21- hypoxia requiring oxygen   Diabetes mellitus without complication (HCC)    Frequency of urination    GERD (gastroesophageal reflux disease)    Gluteal abscess    ecoli after covid hospitalization 2021   Headache    Hypertension    Obesity    Oxygen dependent    o2@ 2L   Scar tissue    Scar Tissue on lungs due to Covid   Sleep apnea    Stomach ulcer    Tachycardia    Thoracic aortic aneurysm [facility])     Past Surgical History:  Procedure Laterality Date   BIOPSY  05/17/2021   Procedure: BIOPSY;  Surgeon: Rachael Fee, MD;  Location: [facility];  Service: Gastroenterology;;  EGD and COLON   COLONOSCOPY WITH PROPOFOL N/A 05/17/2021   Surgeon: Rachael Fee, MD; mild erythema and granularity of the colonic mucosa with benign biopsies, internal hemorrhoids suspected to be etiology of rectal bleeding.   ESOPHAGOGASTRODUODENOSCOPY (EGD) WITH PROPOFOL N/A 05/17/2021   Surgeon: Rachael Fee, MD; very mild, nonspecific distal gastritis s/p biopsied, otherwise normal exam.  Gastric biopsy was benign, negative for H. pylori.   ESOPHAGOGASTRODUODENOSCOPY (EGD) WITH PROPOFOL N/A 04/15/2022   Procedure: ESOPHAGOGASTRODUODENOSCOPY (EGD) WITH PROPOFOL;  Surgeon: Lanelle Bal, DO;  [facility];  Service: Endoscopy;  Laterality: N/A;   FLEXIBLE SIGMOIDOSCOPY N/A 04/15/2022   Procedure: FLEXIBLE SIGMOIDOSCOPY;  Surgeon: Lanelle Bal, DO;  Location: [facility];  Service: Endoscopy;  Laterality: N/A;  100pm, asa 3   GIVENS CAPSULE STUDY N/A 11/16/2021    Surgeon: Lanelle Bal, DO; poor and only able to identify first gastric image.   GIVENS CAPSULE STUDY N/A 04/15/2022   Procedure: GIVENS CAPSULE STUDY;  Surgeon: Lanelle Bal, DO;  Location: [facility];  Service: Endoscopy;  Laterality: N/A;    Prior to Admission medications   Medication Sig Start Date End Date Taking? Authorizing Provider  ascorbic acid (VITAMIN C) 500 MG tablet Take 1 tablet (500 mg total) by mouth daily. 10/23/19  Yes Emokpae, Courage, MD  atorvastatin (LIPITOR) 10 MG tablet TAKE 1 TABLET(10 MG) BY MOUTH DAILY 07/26/22  Yes [name], MD  Blood Glucose Monitoring Suppl (ONE TOUCH ULTRA 2) w/Device KIT 1 each by Does not apply route 3 (three) times daily. 07/31/22  Yes [name], MD  buPROPion (WELLBUTRIN XL) 150 MG 24 hr tablet TAKE 1 TABLET(150 MG) BY MOUTH DAILY 04/18/22  Yes [name], MD  cetirizine (ZYRTEC) 10 MG tablet TAKE 1 TABLET BY MOUTH DAILY 06/25/22  Yes [name], MD  Cholecalciferol (VITAMIN D) 50 MCG (2000 UT) tablet Take 2,000 Units by mouth daily.   Yes [provider]  dexlansoprazole (DEXILANT) 60 MG capsule TAKE 1 CAPSULE(60 MG) BY MOUTH DAILY 07/26/22  Yes Lanelle Bal, DO  hydrocortisone (ANUSOL-HC) 25 MG suppository Place 1 suppository (25 mg total) rectally 2 (two) times daily. 05/02/22  Yes Ermalinda Memos S, PA-C  metoprolol succinate (TOPROL-XL) 100 MG  24 hr tablet Take 1 tablet (100 mg total) by mouth daily. 08/22/22  Yes Rollene Rotunda, MD  Multiple Vitamin (MULTIVITAMIN) tablet Take 1 tablet by mouth daily.   Yes [provider]  Oxycodone HCl 10 MG TABS Take 1-2 tablets (10-20 mg total) by mouth 3 (three) times daily as needed. 04/18/22  Yes   potassium chloride SA (KLOR-CON M) 20 MEQ tablet TAKE 2 TABLETS BY MOUTH DAILY 01/23/22  Yes Rollene Rotunda, MD  prazosin (MINIPRESS) 2 MG capsule TAKE 1 CAPSULE(2 MG) BY MOUTH AT BEDTIME 07/17/22  Yes [name], MD  sodium chloride (OCEAN) 0.65  % SOLN nasal spray Place 1 spray into both nostrils as needed for congestion. 06/02/20  Yes Cathlean Marseilles A, NP  sucralfate (CARAFATE) 1 g tablet TAKE 1 TABLET WITH MEALS AND AT BEDTIME MAX OF 4/DAY Patient taking differently: Take 1 g by mouth 3 (three) times daily as needed. 03/21/22  Yes Letta Median, PA-C  venlafaxine XR (EFFEXOR XR) 75 MG 24 hr capsule Take 3 capsules (225 mg total) by mouth daily with breakfast. 07/02/22  Yes [name], MD  zolpidem (AMBIEN) 10 MG tablet TAKE 1 TABLET(10 MG) BY MOUTH AT BEDTIME AS NEEDED FOR SLEEP 08/09/22  Yes [name], MD  Accu-Chek Softclix Lancets lancets Use to check blood sugar fasting, daily. 08/06/22   [name], MD  acetaminophen (TYLENOL) 325 MG tablet Take 650 mg by mouth every 6 (six) hours as needed for moderate pain.    [provider]  albuterol (VENTOLIN HFA) 108 (90 Base) MCG/ACT inhaler Inhale 2 puffs into the lungs every 4 (four) hours as needed for wheezing or shortness of breath. 08/10/21   [name], MD  ciclopirox (PENLAC) 8 % solution Apply topically at bedtime. Apply over nail and surrounding skin. Apply daily over previous coat. After seven (7) days, may remove with alcohol and continue cycle. 10/18/21   [name], MD  clotrimazole-betamethasone (LOTRISONE) cream APPLY TOPICALLY TO THE AFFECTED AREA TWICE DAILY Patient taking differently: Apply 1 Application topically 2 (two) times daily as needed (irritation). 09/21/21   [name], MD  ferrous sulfate (FEROSUL) 325 (65 FE) MG tablet TAKE 1 TABLET BY MOUTH EVERY MORNING 02/22/22   [name], MD  fluticasone [facility]) 50 MCG/ACT nasal spray Place 2 sprays into both nostrils daily as needed for allergies. 06/19/21   [name], MD  glucose blood test strip Use to check fasting blood sugar, daily. 08/06/22   [name], MD  Lancets MISC Check fbs daily 03/19/21   [name], MD  ofloxacin (OCUFLOX) 0.3 %  ophthalmic solution Place 1 drop into eye(s) 4 (four) times daily as needed for eye infection. 01/24/22     Oxycodone HCl 10 MG TABS Take 1-2 tablets (10-20 mg total) by mouth 3 (three) times daily as needed. 12/24/21     Oxycodone HCl 10 MG TABS Take 1-2 tablets (10-20 mg total) by mouth 3 (three) times daily as needed 07/28/22     Oxycodone HCl 10 MG TABS Take 1-2 tablets (10-20 mg total) by mouth 3 (three) times daily as needed. 08/28/22     tirzepatide (MOUNJARO) 15 MG/0.5ML Pen Inject 15 mg into the skin once a week. 07/23/22   [name], MD    Allergies as of 08/13/2022   (No Known Allergies)    Family History  Problem Relation Age of Onset   Diabetes Mother  Hypertension Mother    Hyperlipidemia Mother    Thyroid disease Mother    Diabetes Father    Hyperlipidemia Father    Hypertension Father    Depression Father    Anxiety disorder Father    Sleep apnea Father    Colon cancer Neg Hx    Stomach cancer Neg Hx     Social History   Socioeconomic History   Marital status: Married    Spouse name: Not on file   Number of children: Not on file   Years of education: Not on file   Highest education level: Not on file  Occupational History   Occupation: stay at home spouse  Tobacco Use   Smoking status: Never   Smokeless tobacco: Never  Vaping Use   Vaping status: Never Used  Substance and Sexual Activity   Alcohol use: Not Currently    Comment: occasional   Drug use: Not Currently    Types: Marijuana    Comment: occasionally   Sexual activity: Yes  Other Topics Concern   Not on file  Social History Narrative   Lives at home with wife and two children.  Four children in all.     Social Determinants of Health   Financial Resource Strain: Not on file  Food Insecurity: No Food Insecurity (04/02/2022)   Hunger Vital Sign    Worried About Running Out of Food in the Last Year: Never true    Ran Out of Food in the Last Year: Never true  Transportation Needs: No  Transportation Needs (04/02/2022)   PRAPARE - Administrator, Civil Service (Medical): No    Lack of Transportation (Non-Medical): No  Physical Activity: Not on file  Stress: Not on file  Social Connections: Not on file  Intimate Partner Violence: Not At Risk (04/02/2022)   Humiliation, Afraid, Rape, and Kick questionnaire    Fear of Current or Ex-Partner: No    Emotionally Abused: No    Physically Abused: No    Sexually Abused: No    Review of Systems: See HPI, otherwise negative ROS  Physical Exam: Vital signs in last 24 hours: Temp:  [98.5 F (36.9 C)] 98.5 F (36.9 C) (08/26 0940) Pulse Rate:  [102] 102 (08/26 0940) Resp:  [24] 24 (08/26 0940) BP: (111)/(97) 111/97 (08/26 0940) SpO2:  [99 %] 99 % (08/26 0940) Weight:  [197.3 kg] 197.3 kg (08/26 0940)   General:   Alert,  Well-developed, well-nourished, pleasant and cooperative in NAD Head:  Normocephalic and atraumatic. Eyes:  Sclera clear, no icterus.   Conjunctiva pink. Ears:  Normal auditory acuity. Nose:  No deformity, discharge,  or lesions. Msk:  Symmetrical without gross deformities. Normal posture. Extremities:  Without clubbing or edema. Neurologic:  Alert and  oriented x4;  grossly normal neurologically. Skin:  Intact without significant lesions or rashes. Psych:  Alert and cooperative. Normal mood and affect.  Impression/Plan: [name] is here for a colonoscopy to be performed for rectal bleeding and abdominal pain  The risks of the procedure including infection, bleed, or perforation as well as benefits, limitations, alternatives and imponderables have been reviewed with the patient. Questions have been answered. All parties agreeable.

## 2022-09-16 NOTE — Discharge Instructions (Signed)
  Colonoscopy Discharge Instructions  Read the instructions outlined below and refer to this sheet in the next few weeks. These discharge instructions provide you with general information on caring for yourself after you leave the hospital. Your doctor may also give you specific instructions. While your treatment has been planned according to the most current medical practices available, unavoidable complications occasionally occur.   ACTIVITY You may resume your regular activity, but move at a slower pace for the next 24 hours.  Take frequent rest periods for the next 24 hours.  Walking will help get rid of the air and reduce the bloated feeling in your belly (abdomen).  No driving for 24 hours (because of the medicine (anesthesia) used during the test).   Do not sign any important legal documents or operate any machinery for 24 hours (because of the anesthesia used during the test).  NUTRITION Drink plenty of fluids.  You may resume your normal diet as instructed by your doctor.  Begin with a light meal and progress to your normal diet. Heavy or fried foods are harder to digest and may make you feel sick to your stomach (nauseated).  Avoid alcoholic beverages for 24 hours or as instructed.  MEDICATIONS You may resume your normal medications unless your doctor tells you otherwise.  WHAT YOU CAN EXPECT TODAY Some feelings of bloating in the abdomen.  Passage of more gas than usual.  Spotting of blood in your stool or on the toilet paper.  IF YOU HAD POLYPS REMOVED DURING THE COLONOSCOPY: No aspirin products for 7 days or as instructed.  No alcohol for 7 days or as instructed.  Eat a soft diet for the next 24 hours.  FINDING OUT THE RESULTS OF YOUR TEST Not all test results are available during your visit. If your test results are not back during the visit, make an appointment with your caregiver to find out the results. Do not assume everything is normal if you have not heard from your  caregiver or the medical facility. It is important for you to follow up on all of your test results.  SEEK IMMEDIATE MEDICAL ATTENTION IF: You have more than a spotting of blood in your stool.  Your belly is swollen (abdominal distention).  You are nauseated or vomiting.  You have a temperature over 101.  You have abdominal pain or discomfort that is severe or gets worse throughout the day.   Unfortunately, your colon was not adequately prepped today for colonoscopy.  I did not see any evidence of colitis.  I did take samples of your entire colon.  Follow-up with GI office in 3 to 4 weeks.   I hope you have a great rest of your week!  Hennie Duos. Marletta Lor, D.O. Gastroenterology and Hepatology [facility]

## 2022-09-18 ENCOUNTER — Encounter (HOSPITAL_COMMUNITY): Payer: Self-pay | Admitting: Internal Medicine

## 2022-09-18 LAB — SURGICAL PATHOLOGY

## 2022-09-19 NOTE — Progress Notes (Signed)
Cardiology Clinic Note   Date: 09/25/2022 ID: Ricardo Park., DOB 03-16-1975, MRN [id number]  Primary Cardiologist:  Rollene Rotunda, MD  Patient Profile    Ricardo Nield. is a 47 y.o. male who presents to the clinic today for routine follow up.     Past medical history significant for: Tachycardia. 3-day ZIO 02/16/2020: Min HR 72 bpm, max HR 152 bpm, average HR 93 bpm.  No sustained arrhythmias.  Rare atrial ectopy. Thoracic aortic aneurysm. CTA chest aorta 04/16/2021: Stable uncomplicated fusiform aneurysmal dilatation ascending thoracic aorta 44 mm.  Unchanged from September 2021. Hypertension. Hyperlipidemia. Lipid panel 07/02/2022: LDL 88, HDL 43, TG 108, total 151. OSA. BiPAP 100% adherence.  GERD.     History of Present Illness    Ricardo Parekh. was first evaluated by Dr. Antoine Poche on 01/28/2020 for shortness of breath and lower extremity edema at the request of Dr. Tanya Nones.  He had an echo in September 2021 which showed normal LV function with mild LVH, mildly reduced RV function, moderate LAE, trivial MR.  He was found to be tachycardic.  TSH was drawn and metoprolol increased.  He wore a 3-day ZIO which showed average HR 93 bpm.  TSH was elevated and he was referred to PCP.  He underwent sleep study with Dr. Tresa Endo in February 2022 and was found to have moderate OSA.  He was referred for CPAP titration which he did in March 2022 and it was recommended he start BiPAP.  Patient was last seen in the office by Dr. Antoine Poche on 01/08/2021 for routine follow-up.  He was stable from a cardiac standpoint and no medications were changed.  Today, patient is accompanied by his wife. He is doing well from a cardiac standpoint and has no concerns today. No chest pain, pressure, or tightness. No palpitations. Shortness of breath is at baseline. He is on 2L continuous O2. He has never seen pulmonology. Lower extremity edema has improved greatly and he typically notices it if he is on his  feet a lot. He is on Mounjaro and working on losing weight. He restricts his sodium intake.     ROS: All other systems reviewed and are otherwise negative except as noted in History of Present Illness.  Studies Reviewed       EKG is not ordered today.   Physical Exam    VS:  BP 130/80   Pulse 97   Ht 5\' 8"  (1.727 m)   Wt (!) 431 lb (195.5 kg) Comment: verbal weight  SpO2 97%   BMI 65.53 kg/m  , BMI Body mass index is 65.53 kg/m.  GEN: Well nourished, well developed, in no acute distress. Neck: No JVD or carotid bruits. Cardiac:  RRR. No murmurs. No rubs or gallops.   Respiratory:  Respirations regular and unlabored. Clear to auscultation without rales, wheezing or rhonchi. GI: Soft, nontender, nondistended. Extremities: Radials/DP/PT 2+ and equal bilaterally. No clubbing or cyanosis. Mild nonpitting edema bilateral lower extremities.   Skin: Warm and dry, no rash. Neuro: Strength intact.  Assessment & Plan    Dyspnea. Patient reports shortness of breath at baseline. He is on 2 L continuous O2. SPO2 97% today. He has never been seen by pulmonology. He requests referral today.  Tachycardia.  3-day ZIO January 2022 showed average heart rate 93 bpm with no sustained arrhythmias and rare atrial ectopy. Patient denies palpitations. His heart rate will increase if he is exerting himself but will resolve with rest.  Continue metoprolol. Thoracic aortic aneurysm.  CTA chest aorta March 2023 showed stable dilatation of ascending thoracic aorta 44 mm unchanged from September 2021.  Patient reports baseline shortness of breath on 2L continuous O2. No chest pain. Occasional lightheadedness typically with decreased SPO2.  Will get repeat CTA chest aorta for surveillance. Hypertension: BP today 130/80. Patient denies headaches, dizziness or vision changes. Continue metoprolol. Hyperlipidemia.  LDL June 2024 88, at goal.  Continue atorvastatin.  Followed by PCP. OSA. Patient reports 100%  adherence with BiPAP.   Disposition: Referral to pulmonology. Return in 1 year or sooner as needed.          Signed, Etta Grandchild. Ricardo Castles, DNP, NP-C

## 2022-09-20 NOTE — Anesthesia Postprocedure Evaluation (Signed)
Anesthesia Post Note  Patient: Ricardo Lopez.  Procedure(s) Performed: COLONOSCOPY WITH PROPOFOL BIOPSY  Patient location during evaluation: Phase II Anesthesia Type: General Level of consciousness: awake Pain management: pain level controlled Vital Signs Assessment: post-procedure vital signs reviewed and stable Respiratory status: spontaneous breathing and respiratory function stable Cardiovascular status: blood pressure returned to baseline and stable Postop Assessment: no headache and no apparent nausea or vomiting Anesthetic complications: no Comments: Late entry   No notable events documented.   Last Vitals:  Vitals:   09/16/22 0940 09/16/22 1135  BP: (!) 111/97 115/68  Pulse: (!) 102 88  Resp: (!) 24 18  Temp: 36.9 C 36.7 C  SpO2: 99% 100%    Last Pain:  Vitals:   09/16/22 1135  TempSrc: Axillary  PainSc: 0-No pain                 Windell Norfolk

## 2022-09-25 ENCOUNTER — Ambulatory Visit: Payer: 59 | Attending: Student | Admitting: Student

## 2022-09-25 ENCOUNTER — Encounter: Payer: Self-pay | Admitting: Student

## 2022-09-25 ENCOUNTER — Other Ambulatory Visit (HOSPITAL_COMMUNITY): Payer: Self-pay

## 2022-09-25 ENCOUNTER — Other Ambulatory Visit: Payer: Self-pay | Admitting: Student

## 2022-09-25 VITALS — BP 130/80 | HR 97 | Ht 68.0 in | Wt >= 6400 oz

## 2022-09-25 DIAGNOSIS — G4733 Obstructive sleep apnea (adult) (pediatric): Secondary | ICD-10-CM

## 2022-09-25 DIAGNOSIS — I7121 Aneurysm of the ascending aorta, without rupture: Secondary | ICD-10-CM | POA: Diagnosis not present

## 2022-09-25 DIAGNOSIS — R000 Tachycardia, unspecified: Secondary | ICD-10-CM | POA: Diagnosis not present

## 2022-09-25 DIAGNOSIS — R0602 Shortness of breath: Secondary | ICD-10-CM

## 2022-09-25 DIAGNOSIS — E7800 Pure hypercholesterolemia, unspecified: Secondary | ICD-10-CM

## 2022-09-25 DIAGNOSIS — I10 Essential (primary) hypertension: Secondary | ICD-10-CM | POA: Diagnosis not present

## 2022-09-25 MED ORDER — OXYCODONE HCL 10 MG PO TABS
10.0000 mg | ORAL_TABLET | Freq: Three times a day (TID) | ORAL | 0 refills | Status: DC | PRN
Start: 2022-09-27 — End: 2022-11-28
  Filled 2022-09-25 – 2022-09-27 (×2): qty 180, 30d supply, fill #0

## 2022-09-25 MED ORDER — METOPROLOL SUCCINATE ER 100 MG PO TB24: 100.0000 mg | ORAL_TABLET | Freq: Every day | ORAL | 1 refills | Status: DC

## 2022-09-25 NOTE — Patient Instructions (Signed)
Medication Instructions:  Your refill for Metoprolol 100mg  has been sent to your pharmacy. *If you need a refill on your cardiac medications before your next appointment, please call your pharmacy*   Lab Work: none If you have labs (blood work) drawn today and your tests are completely normal, you will receive your results only by: MyChart Message (if you have MyChart) OR A paper copy in the mail If you have any lab test that is abnormal or we need to change your treatment, we will call you to review the results.   Testing/Procedures: Non-Cardiac CT Angiography (CTA), is a special type of CT scan that uses a computer to produce multi-dimensional views of major blood vessels throughout the body. In CT angiography, a contrast material is injected through an IV to help visualize the blood vessels    Follow-Up: At [facility], you and your health needs are our priority.  As part of our continuing mission to provide you with exceptional heart care, we have created designated Provider Care Teams.  These Care Teams include your primary Cardiologist (physician) and Advanced Practice Providers (APPs -  Physician Assistants and Nurse Practitioners) who all work together to provide you with the care you need, when you need it.  We recommend signing up for the patient portal called "MyChart".  Sign up information is provided on this After Visit Summary.  MyChart is used to connect with patients for Virtual Visits (Telemedicine).  Patients are able to view lab/test results, encounter notes, upcoming appointments, etc.  Non-urgent messages can be sent to your provider as well.   To learn more about what you can do with MyChart, go to [email or web address].    Your next appointment:   1 year(s)  Provider:   Rollene Rotunda, MD     Other Instructions Referral for Pulmonology has been ordered for you for shortness of breath.

## 2022-09-26 ENCOUNTER — Encounter: Payer: Self-pay | Admitting: Physician Assistant

## 2022-09-27 ENCOUNTER — Other Ambulatory Visit (HOSPITAL_COMMUNITY): Payer: Self-pay

## 2022-10-01 ENCOUNTER — Other Ambulatory Visit: Payer: Self-pay | Admitting: *Deleted

## 2022-10-01 DIAGNOSIS — D500 Iron deficiency anemia secondary to blood loss (chronic): Secondary | ICD-10-CM

## 2022-10-01 DIAGNOSIS — D573 Sickle-cell trait: Secondary | ICD-10-CM

## 2022-10-02 ENCOUNTER — Other Ambulatory Visit: Payer: Self-pay

## 2022-10-02 DIAGNOSIS — D500 Iron deficiency anemia secondary to blood loss (chronic): Secondary | ICD-10-CM

## 2022-10-03 ENCOUNTER — Other Ambulatory Visit: Payer: Self-pay | Admitting: Family Medicine

## 2022-10-03 ENCOUNTER — Inpatient Hospital Stay: Payer: 59 | Attending: Hematology

## 2022-10-03 DIAGNOSIS — D500 Iron deficiency anemia secondary to blood loss (chronic): Secondary | ICD-10-CM

## 2022-10-03 DIAGNOSIS — D573 Sickle-cell trait: Secondary | ICD-10-CM

## 2022-10-03 DIAGNOSIS — D509 Iron deficiency anemia, unspecified: Secondary | ICD-10-CM | POA: Diagnosis present

## 2022-10-03 DIAGNOSIS — R0982 Postnasal drip: Secondary | ICD-10-CM

## 2022-10-03 LAB — CBC WITH DIFFERENTIAL/PLATELET
Abs Immature Granulocytes: 0.03 10*3/uL (ref 0.00–0.07)
Basophils Absolute: 0 10*3/uL (ref 0.0–0.1)
Basophils Relative: 0 %
Eosinophils Absolute: 0.2 10*3/uL (ref 0.0–0.5)
Eosinophils Relative: 2 %
HCT: 33.9 % — ABNORMAL LOW (ref 39.0–52.0)
Hemoglobin: 10 g/dL — ABNORMAL LOW (ref 13.0–17.0)
Immature Granulocytes: 0 %
Lymphocytes Relative: 29 %
Lymphs Abs: 2.9 10*3/uL (ref 0.7–4.0)
MCH: 20.9 pg — ABNORMAL LOW (ref 26.0–34.0)
MCHC: 29.5 g/dL — ABNORMAL LOW (ref 30.0–36.0)
MCV: 70.8 fL — ABNORMAL LOW (ref 80.0–100.0)
Monocytes Absolute: 0.8 10*3/uL (ref 0.1–1.0)
Monocytes Relative: 8 %
Neutro Abs: 6.2 10*3/uL (ref 1.7–7.7)
Neutrophils Relative %: 61 %
Platelets: 242 10*3/uL (ref 150–400)
RBC: 4.79 MIL/uL (ref 4.22–5.81)
RDW: 15.1 % (ref 11.5–15.5)
WBC: 10.1 10*3/uL (ref 4.0–10.5)
nRBC: 0 % (ref 0.0–0.2)

## 2022-10-03 LAB — FERRITIN: Ferritin: 190 ng/mL (ref 24–336)

## 2022-10-03 LAB — IRON AND TIBC
Iron: 64 ug/dL (ref 45–182)
Saturation Ratios: 23 % (ref 17.9–39.5)
TIBC: 275 ug/dL (ref 250–450)
UIBC: 211 ug/dL

## 2022-10-04 NOTE — Telephone Encounter (Signed)
Requested medication (s) are due for refill today: yes  Requested medication (s) are on the active medication list: yes  Last refill:  06/25/22  Future visit scheduled: no  Notes to clinic:  Unable to refill per protocol, courtesy refill already given, routing for provider approval.      Requested Prescriptions  Pending Prescriptions Disp Refills   cetirizine (ZYRTEC) 10 MG tablet [Pharmacy Med Name: CETIRIZINE 10MG  TABLETS] 90 tablet 0    Sig: TAKE 1 TABLET BY MOUTH DAILY     Ear, Nose, and Throat:  Antihistamines 2 Failed - 10/03/2022  9:24 PM      Failed - Valid encounter within last 12 months    Recent Outpatient Visits           1 year ago Type 2 diabetes mellitus with hyperglycemia, without long-term current use of insulin (HCC)   [facility] Pickard, Priscille Heidelberg, MD   2 years ago Acute midline low back pain, unspecified whether sciatica present   [facility] [name], MD   2 years ago Depression, major, single episode, moderate (HCC)   [facility] [name], MD   2 years ago SOB (shortness of breath) on exertion   [facility] Pickard, Priscille Heidelberg, MD   2 years ago Upper respiratory tract infection, unspecified type   [facility] Valentino Nose, NP              Passed - Cr in normal range and within 360 days    Creat  Date Value Ref Range Status  07/02/2022 1.15 0.60 - 1.29 mg/dL Final

## 2022-10-07 ENCOUNTER — Encounter: Payer: Self-pay | Admitting: *Deleted

## 2022-10-15 ENCOUNTER — Ambulatory Visit (INDEPENDENT_AMBULATORY_CARE_PROVIDER_SITE_OTHER): Payer: 59 | Admitting: Gastroenterology

## 2022-10-15 ENCOUNTER — Encounter: Payer: Self-pay | Admitting: Gastroenterology

## 2022-10-15 VITALS — BP 135/77 | HR 88 | Temp 98.1°F | Ht 68.0 in | Wt >= 6400 oz

## 2022-10-15 DIAGNOSIS — R1013 Epigastric pain: Secondary | ICD-10-CM

## 2022-10-15 NOTE — Progress Notes (Signed)
Gastroenterology Office Note     Primary Care Physician:  [name], MD  Primary Gastroenterologist: Dr. Marletta Lor    Chief Complaint   Chief Complaint  Patient presents with   Follow-up    Follow up after colonoscopy and 4 week check     History of Present Illness   Ricardo Lamay. is a 47 y.o. male presenting today with a history of chronic microcytic anemia dating back to at least 2019, chronic rectal bleeding despite hemorrhoid banding, GERD, chronic epigastric pain.   Sed rate elevated at 50 3 months ago and CRP 31.5. See GI evaluation below. He is also followed by Hematology with anemia due to sickle cell trait, alpha thalassemia trait, and IDA due to rectal bleeding.   Notes underlying abdominal pain since  Covid. Not necessarily related to eating. Some food aversions. Gas and sour burps.  2-3 times a week will have abdominal cramping followed by moderate amount of rectal bleeding with clots. Will feel dizzy sometimes before this happens. It will feel like an intense pain.   No constipation or diarrhea. Has BM every 2 days. Purposeful weight loss.       Prior GI work-up:  EGD/colonoscopy April 2023 by  Dr. Christella Hartigan as patient was not a candidate for procedures at [facility] due to morbid obesity: EGD: very mild, nonspecific distal gastritis s/p biopsied, otherwise normal exam.  Gastric biopsy was benign, negative for H. pylori.    Colonoscopy: Mild erythema and granularity of the colon mucosa, possibly more significant on the left than the right side. Biopsies taken to check for chronic inflammation versus possible prep effect. Internal hemorrhoids. The examination was otherwise normal on direct and retroflexion views.  Colon biopsies were benign.  Overall, suspected rectal bleeding likely secondary to hemorrhoids.    Hemorrhoid banding x 3.  Body habitus complicated hemorrhoid banding resulting in banding only in the neutral position.  Last  attempted hemorrhoid banding October 2023, unsuccessful attempt to put a band on sufficient tissue x 2. No real rectal pain.    Givens capsule 11/16/2021: Poor prep, only able to identify first gastric image.   CT enterography 01/18/2022 with no significant bowel abnormalities.  He had a 1.7 cm right external iliac lymph node of uncertain etiology and significance.  Recommended follow-up CT in 3-6 months.  He is on recall for repeat CT. Repeat CT June 2024 with stable lymph node dating back to 2021 and favored benign/reactive.    Flexible sigmoidoscopy 04/15/2022 unremarkable aside from internal hemorrhoids.   EGD 04/15/2022: Gastritis, otherwise normal exam.   Givens capsule 3/25: Entirely normal exam.  Colonoscopy Aug 2024: inadequate prep, stool in colon, biopsies negative for IBD. Unable to intubate TI.       Past Medical History:  Diagnosis Date   Anemia    Anxiety and depression    B12 deficiency    Back pain    COPD (chronic obstructive pulmonary disease) (HCC)    COVID    9/21- hypoxia requiring oxygen   Diabetes mellitus without complication (HCC)    Frequency of urination    GERD (gastroesophageal reflux disease)    Gluteal abscess    ecoli after covid hospitalization 2021   Headache    Hypercholesterolemia    Hypertension    Obesity    Oxygen dependent    o2@ 2L   Scar tissue    Scar Tissue on lungs due to Covid   Sleep apnea  Stomach ulcer    Tachycardia    Thoracic aortic aneurysm [facility])     Past Surgical History:  Procedure Laterality Date   BIOPSY  05/17/2021   Procedure: BIOPSY;  Surgeon: Rachael Fee, MD;  Location: [facility];  Service: Gastroenterology;;  EGD and COLON   BIOPSY  09/16/2022   Procedure: BIOPSY;  Surgeon: Lanelle Bal, DO;  Location: [facility];  Service: Endoscopy;;   COLONOSCOPY WITH PROPOFOL N/A 05/17/2021   Surgeon: Rachael Fee, MD; mild erythema and granularity of the colonic mucosa with benign biopsies,  internal hemorrhoids suspected to be etiology of rectal bleeding.   COLONOSCOPY WITH PROPOFOL N/A 09/16/2022   Procedure: COLONOSCOPY WITH PROPOFOL;  Surgeon: Lanelle Bal, DO;  Location: [facility];  Service: Endoscopy;  Laterality: N/A;  10:15 am, asa 3   ESOPHAGOGASTRODUODENOSCOPY (EGD) WITH PROPOFOL N/A 05/17/2021   Surgeon: Rachael Fee, MD; very mild, nonspecific distal gastritis s/p biopsied, otherwise normal exam.  Gastric biopsy was benign, negative for H. pylori.   ESOPHAGOGASTRODUODENOSCOPY (EGD) WITH PROPOFOL N/A 04/15/2022   Procedure: ESOPHAGOGASTRODUODENOSCOPY (EGD) WITH PROPOFOL;  Surgeon: Lanelle Bal, DO;  Location: [facility];  Service: Endoscopy;  Laterality: N/A;   FLEXIBLE SIGMOIDOSCOPY N/A 04/15/2022   Procedure: FLEXIBLE SIGMOIDOSCOPY;  Surgeon: Lanelle Bal, DO;  Location: [facility];  Service: Endoscopy;  Laterality: N/A;  100pm, asa 3   GIVENS CAPSULE STUDY N/A 11/16/2021   Surgeon: Lanelle Bal, DO; poor and only able to identify first gastric image.   GIVENS CAPSULE STUDY N/A 04/15/2022   Procedure: GIVENS CAPSULE STUDY;  Surgeon: Lanelle Bal, DO;  Location: [facility];  Service: Endoscopy;  Laterality: N/A;    Current Outpatient Medications  Medication Sig Dispense Refill   Accu-Chek Softclix Lancets lancets Use to check blood sugar fasting, daily. 100 each 12   acetaminophen (TYLENOL) 325 MG tablet Take 650 mg by mouth every 6 (six) hours as needed for moderate pain.     albuterol (VENTOLIN HFA) 108 (90 Base) MCG/ACT inhaler Inhale 2 puffs into the lungs every 4 (four) hours as needed for wheezing or shortness of breath. 18 g 2   ascorbic acid (VITAMIN C) 500 MG tablet Take 1 tablet (500 mg total) by mouth daily. 30 tablet 1   atorvastatin (LIPITOR) 10 MG tablet TAKE 1 TABLET(10 MG) BY MOUTH DAILY 90 tablet 3   Blood Glucose Monitoring Suppl (ONE TOUCH ULTRA 2) w/Device KIT 1 each by Does not apply route 3 (three) times daily.  1 kit 1   buPROPion (WELLBUTRIN XL) 150 MG 24 hr tablet TAKE 1 TABLET(150 MG) BY MOUTH DAILY 30 tablet 5   cetirizine (ZYRTEC) 10 MG tablet TAKE 1 TABLET BY MOUTH DAILY 90 tablet 0   Cholecalciferol (VITAMIN D) 50 MCG (2000 UT) tablet Take 2,000 Units by mouth daily.     ciclopirox (PENLAC) 8 % solution Apply topically at bedtime. Apply over nail and surrounding skin. Apply daily over previous coat. After seven (7) days, may remove with alcohol and continue cycle. 6.6 mL 5   clotrimazole-betamethasone (LOTRISONE) cream APPLY TOPICALLY TO THE AFFECTED AREA TWICE DAILY (Patient taking differently: Apply 1 Application topically 2 (two) times daily as needed (irritation).) 45 g 1   dexlansoprazole (DEXILANT) 60 MG capsule TAKE 1 CAPSULE(60 MG) BY MOUTH DAILY 30 capsule 3   ferrous sulfate (FEROSUL) 325 (65 FE) MG tablet TAKE 1 TABLET BY MOUTH EVERY MORNING 90 tablet 1   fluticasone (  FLONASE) 50 MCG/ACT nasal spray Place 2 sprays into both nostrils daily as needed for allergies. 16 g 3   glucose blood test strip Use to check fasting blood sugar, daily. 100 each 12   hydrocortisone (ANUSOL-HC) 25 MG suppository Place 1 suppository (25 mg total) rectally 2 (two) times daily. 30 suppository 1   Lancets MISC Check fbs daily 100 each 11   metoprolol succinate (TOPROL-XL) 100 MG 24 hr tablet Take 1 tablet (100 mg total) by mouth daily. 30 tablet 1   Multiple Vitamin (MULTIVITAMIN) tablet Take 1 tablet by mouth daily.     Oxycodone HCl 10 MG TABS Take 1-2 tablets (10-20 mg total) by mouth 3 (three) times daily as needed. 180 tablet 0   potassium chloride SA (KLOR-CON M) 20 MEQ tablet TAKE 2 TABLETS BY MOUTH DAILY 30 tablet 0   prazosin (MINIPRESS) 2 MG capsule TAKE 1 CAPSULE(2 MG) BY MOUTH AT BEDTIME 30 capsule 3   sucralfate (CARAFATE) 1 g tablet TAKE 1 TABLET WITH MEALS AND AT BEDTIME MAX OF 4/DAY (Patient taking differently: Take 1 g by mouth 3 (three) times daily as needed.) 360 tablet 0   tirzepatide  (MOUNJARO) 15 MG/0.5ML Pen Inject 15 mg into the skin once a week. 6 mL 3   venlafaxine XR (EFFEXOR XR) 75 MG 24 hr capsule Take 3 capsules (225 mg total) by mouth daily with breakfast. 270 capsule 3   zolpidem (AMBIEN) 10 MG tablet TAKE 1 TABLET(10 MG) BY MOUTH AT BEDTIME AS NEEDED FOR SLEEP 30 tablet 1   ofloxacin (OCUFLOX) 0.3 % ophthalmic solution Place 1 drop into eye(s) 4 (four) times daily as needed for eye infection. (Patient not taking: Reported on 10/15/2022) 5 mL 0   Oxycodone HCl 10 MG TABS Take 1-2 tablets (10-20 mg total) by mouth 3 (three) times daily as needed. (Patient not taking: Reported on 10/15/2022) 180 tablet 0   Oxycodone HCl 10 MG TABS Take 1-2 tablets (10-20 mg total) by mouth 3 (three) times daily as needed (Patient not taking: Reported on 10/15/2022) 180 tablet 0   Oxycodone HCl 10 MG TABS Take 1-2 tablets (10-20 mg total) by mouth 3 (three) times daily as needed. (Patient not taking: Reported on 10/15/2022) 180 tablet 0   Oxycodone HCl 10 MG TABS Take 1-2 tablets (10-20 mg total) by mouth 3 (three) times daily as needed. (Patient not taking: Reported on 10/15/2022) 180 tablet 0   sodium chloride (OCEAN) 0.65 % SOLN nasal spray Place 1 spray into both nostrils as needed for congestion. (Patient not taking: Reported on 10/15/2022) 88 mL 0   No current facility-administered medications for this visit.    Allergies as of 10/15/2022   (No Known Allergies)    Family History  Problem Relation Age of Onset   Diabetes Mother    Hypertension Mother    Hyperlipidemia Mother    Thyroid disease Mother    Diabetes Father    Hyperlipidemia Father    Hypertension Father    Depression Father    Anxiety disorder Father    Sleep apnea Father    Colon cancer Neg Hx    Stomach cancer Neg Hx     Social History   Socioeconomic History   Marital status: Married    Spouse name: Not on file   Number of children: Not on file   Years of education: Not on file   Highest education  level: Not on file  Occupational History   Occupation: stay at home  spouse  Tobacco Use   Smoking status: Never   Smokeless tobacco: Never  Vaping Use   Vaping status: Never Used  Substance and Sexual Activity   Alcohol use: Not Currently    Comment: occasional   Drug use: Not Currently    Types: Marijuana    Comment: occasionally   Sexual activity: Yes  Other Topics Concern   Not on file  Social History Narrative   Lives at home with wife and two children.  Four children in all.     Social Determinants of Health   Financial Resource Strain: Not on file  Food Insecurity: No Food Insecurity (04/02/2022)   Hunger Vital Sign    Worried About Running Out of Food in the Last Year: Never true    Ran Out of Food in the Last Year: Never true  Transportation Needs: No Transportation Needs (04/02/2022)   PRAPARE - Administrator, Civil Service (Medical): No    Lack of Transportation (Non-Medical): No  Physical Activity: Not on file  Stress: Not on file  Social Connections: Not on file  Intimate Partner Violence: Not At Risk (04/02/2022)   Humiliation, Afraid, Rape, and Kick questionnaire    Fear of Current or Ex-Partner: No    Emotionally Abused: No    Physically Abused: No    Sexually Abused: No     Review of Systems   Gen: Denies any fever, chills, fatigue, weight loss, lack of appetite.  CV: Denies chest pain, heart palpitations, peripheral edema, syncope.  Resp: Denies shortness of breath at rest or with exertion. Denies wheezing or cough.  GI: Denies dysphagia or odynophagia. Denies jaundice, hematemesis, fecal incontinence. GU : Denies urinary burning, urinary frequency, urinary hesitancy MS: Denies joint pain, muscle weakness, cramps, or limitation of movement.  Derm: Denies rash, itching, dry skin Psych: Denies depression, anxiety, memory loss, and confusion Heme: Denies bruising, bleeding, and enlarged lymph nodes.   Physical Exam   BP 135/77   Pulse  88   Temp 98.1 F (36.7 C)   Ht 5\' 8"  (1.727 m)   Wt (!) 434 lb (196.9 kg)   BMI 65.99 kg/m  General:   Alert and oriented. Pleasant and cooperative. Well-nourished and well-developed.  Head:  Normocephalic and atraumatic. Eyes:  Without icterus Abdomen:  +BS, soft, non-tender and non-distended. Obese.  Rectal:  Deferred  Msk:  Symmetrical without gross deformities. Normal posture. Extremities:  Without edema. Neurologic:  Alert and  oriented x4;  grossly normal neurologically. Skin:  Intact without significant lesions or rashes. Psych:  Alert and cooperative. Normal mood and affect.   Assessment   Ricardo Lopez. is a 47 y.o. male presenting today with a history of  chronic microcytic anemia dating back to at least 2019, chronic rectal bleeding despite hemorrhoid banding, GERD, chronic epigastric pain.   As noted in HPI, he has had an extensive evaluation and interesting constellation of signs/symptoms. His chronic abdominal pain at times escalates followed by abdominal cramping and large amount of bleeding with clots. He does endorse food fear as afraid this will escalate symptoms. I was able to see a picture of bleeding today, and this was not consistent with a low volume hemorrhoid bleeding. Will arrange CTA to assess mesenteric vasculature.     PLAN    CTA to wrap up evaluation Further recommendations to follow   Ricardo Mink, PhD, ANP-BC [facility]

## 2022-10-15 NOTE — Patient Instructions (Signed)
I am arranging a specialized CT that looks at your blood vessels in your stomach!  Further recommendations to follow!  I enjoyed seeing you again today! I value our relationship and want to provide genuine, compassionate, and quality care. You may receive a survey regarding your visit with me, and I welcome your feedback! Thanks so much for taking the time to complete this. I look forward to seeing you again.      [name], PhD, ANP-BC [facility]

## 2022-10-16 ENCOUNTER — Telehealth: Payer: Self-pay | Admitting: *Deleted

## 2022-10-16 NOTE — Telephone Encounter (Signed)
PA approved via evicore for CTA CPT Code: 25366 Description: CT ANGIO ABD&PELV W/O&W/DYE Authorization Number: [id number] Review Date: 10/16/2022 7:47:06 AM Expiration Date: 04/14/2023   Per [employer]: "This member's plan does not currently require notification or prior-authorization through the [employer] Notification or Prior-Authorization Program. Please contact a Customer Care Professional at [phone] if you believe the information returned to be in error."

## 2022-10-21 ENCOUNTER — Other Ambulatory Visit: Payer: Self-pay | Admitting: Family Medicine

## 2022-10-22 NOTE — Telephone Encounter (Signed)
Requested medications are due for refill today.  yes  Requested medications are on the active medications list.  yes  Last refill. 08/09/2022 #30 1 rf  Future visit scheduled.   no  Notes to clinic.  Refill/refusal not delegated.    Requested Prescriptions  Pending Prescriptions Disp Refills   zolpidem (AMBIEN) 10 MG tablet [Pharmacy Med Name: ZOLPIDEM 10MG  TABLETS] 30 tablet     Sig: TAKE 1 TABLET(10 MG) BY MOUTH AT BEDTIME AS NEEDED FOR SLEEP     Not Delegated - Psychiatry:  Anxiolytics/Hypnotics Failed - 10/21/2022  8:52 PM      Failed - This refill cannot be delegated      Failed - Urine Drug Screen completed in last 360 days      Failed - Valid encounter within last 6 months    Recent Outpatient Visits           1 year ago Type 2 diabetes mellitus with hyperglycemia, without long-term current use of insulin (HCC)   [facility] [name], MD   2 years ago Acute midline low back pain, unspecified whether sciatica present   [facility] [name], MD   2 years ago Depression, major, single episode, moderate (HCC)   [facility] [name], MD   2 years ago SOB (shortness of breath) on exertion   [facility] Pickard, Priscille Heidelberg, MD   2 years ago Upper respiratory tract infection, unspecified type   [facility] Valentino Nose, NP

## 2022-10-23 ENCOUNTER — Other Ambulatory Visit (HOSPITAL_COMMUNITY): Payer: Self-pay

## 2022-10-23 ENCOUNTER — Ambulatory Visit (HOSPITAL_COMMUNITY)
Admission: RE | Admit: 2022-10-23 | Discharge: 2022-10-23 | Disposition: A | Discharge status: Home / Self Care | Payer: 59 | Source: Ambulatory Visit | Attending: Gastroenterology | Admitting: Gastroenterology

## 2022-10-23 DIAGNOSIS — R1013 Epigastric pain: Secondary | ICD-10-CM | POA: Diagnosis present

## 2022-10-23 MED ORDER — OXYCODONE HCL 10 MG PO TABS
10.0000 mg | ORAL_TABLET | Freq: Three times a day (TID) | ORAL | 0 refills | Status: DC | PRN
Start: 2022-10-23 — End: 2022-12-02
  Filled 2022-10-23 – 2022-10-28 (×2): qty 180, 30d supply, fill #0

## 2022-10-23 MED ORDER — IOHEXOL 350 MG/ML SOLN
100.0000 mL | Freq: Once | INTRAVENOUS | Status: AC | PRN
  Administered 2022-10-23: 100 mL via INTRAVENOUS

## 2022-10-24 ENCOUNTER — Other Ambulatory Visit (HOSPITAL_COMMUNITY): Payer: Self-pay

## 2022-10-25 ENCOUNTER — Encounter: Payer: Self-pay | Admitting: Hematology

## 2022-10-28 ENCOUNTER — Other Ambulatory Visit (HOSPITAL_COMMUNITY): Payer: Self-pay

## 2022-10-28 ENCOUNTER — Encounter: Payer: Self-pay | Admitting: Family Medicine

## 2022-10-28 ENCOUNTER — Other Ambulatory Visit: Payer: Self-pay

## 2022-10-28 ENCOUNTER — Other Ambulatory Visit: Payer: Self-pay | Admitting: Gastroenterology

## 2022-10-28 MED ORDER — DICYCLOMINE HCL 10 MG PO CAPS: 10.0000 mg | ORAL_CAPSULE | Freq: Four times a day (QID) | ORAL | 0 refills | Status: DC | PRN

## 2022-10-29 ENCOUNTER — Other Ambulatory Visit: Payer: Self-pay | Admitting: Family Medicine

## 2022-10-29 MED ORDER — ZOLPIDEM TARTRATE 10 MG PO TABS: 10.0000 mg | ORAL_TABLET | Freq: Every evening | ORAL | 0 refills | Status: DC | PRN

## 2022-10-31 ENCOUNTER — Encounter: Payer: Self-pay | Admitting: Internal Medicine

## 2022-10-31 ENCOUNTER — Telehealth: Payer: Self-pay

## 2022-10-31 ENCOUNTER — Ambulatory Visit (HOSPITAL_COMMUNITY)
Admission: RE | Admit: 2022-10-31 | Discharge: 2022-10-31 | Disposition: A | Discharge status: Home / Self Care | Payer: 59 | Source: Ambulatory Visit | Attending: Internal Medicine | Admitting: Internal Medicine

## 2022-10-31 ENCOUNTER — Ambulatory Visit (INDEPENDENT_AMBULATORY_CARE_PROVIDER_SITE_OTHER): Payer: 59 | Admitting: Internal Medicine

## 2022-10-31 VITALS — BP 152/70 | HR 107 | Ht 68.0 in | Wt >= 6400 oz

## 2022-10-31 DIAGNOSIS — J9611 Chronic respiratory failure with hypoxia: Secondary | ICD-10-CM | POA: Diagnosis not present

## 2022-10-31 DIAGNOSIS — R0609 Other forms of dyspnea: Secondary | ICD-10-CM | POA: Diagnosis present

## 2022-10-31 NOTE — Telephone Encounter (Signed)
-----   Message from Sandrea Hughs sent at 10/31/2022  4:49 PM EDT ----- Call pt:  Reviewed cxr and no acute change so no change in recommendations made at [facility]

## 2022-10-31 NOTE — Assessment & Plan Note (Addendum)
Complicated by OSA/ dm/ hbp /hyperlipidemia / djd  Body mass index is 64.16 kg/m.  -   Lab Results  Component Value Date   TSH 4.680 (H) 05/18/2020      Contributing to doe and risk of GERD/ dvt/ PE  >>>   reviewed the need and the process to achieve and maintain neg calorie balance > defer f/u primary care including intermittently monitoring thyroid status     Doing better on Mounjaro but still has a long way to go/ advised   Each maintenance medication was reviewed in detail including emphasizing most importantly the difference between maintenance and prns and under what circumstances the prns are to be triggered using an action plan format where appropriate.  Total time for H and P, chart review, counseling, reviewing hfa device(s) , directly observing portions of ambulatory 02 saturation study/ and generating customized AVS unique to this office visit / same day charting = 60 min new pt eval

## 2022-10-31 NOTE — Assessment & Plan Note (Addendum)
As of 10/31/2022 using 2lpm cont with sats 96% with ambulation   Advised: Make sure you check your oxygen saturation  AT  your highest level of activity (not after you stop)   to be sure it stays over 90% and adjust  02 flow upward to maintain this level if needed but remember to turn it back to previous settings when you stop (to conserve your supply).

## 2022-10-31 NOTE — Assessment & Plan Note (Addendum)
Onset with Covid 2021  at wt prior to covid around 400 lb and peaked at 600 in 2022 - 10/31/2022  @ 423lb  Walked on 2lpm cont   x  3  lap(s) =  approx 450  ft  @ mod pace, stopped due to end of study  with lowest 02 sats 96% and no sob at all     As with many pts with MO, his lungs are in much better shape than his symptoms would otherwise suggest as he is accustomed to carrying a heavy load and is down 200 lbs from his peak so rec  1) keep walking (or doing recumbent bike if pain in joints limiting and adjusting the 02 flow to maintain sats > 90% if needed (see resp failure a/p_  2) use approp saba: Re SABA :  I spent extra time with pt today reviewing appropriate use of albuterol for prn use on exertion with the following points: 1) saba is for relief of sob that does not improve by walking a slower pace or resting but rather if the pt does not improve after trying this first. 2) If the pt is convinced, as many are, that saba helps recover from activity faster then it's easy to tell if this is the case by re-challenging : ie stop, take the inhaler, then p 5 minutes try the exact same activity (intensity of workload) that just caused the symptoms and see if they are substantially diminished or not after saba 3) if there is an activity that reproducibly causes the symptoms, try the saba 15 min before the activity on alternate days   If in fact the saba really does help, then fine to continue to use it prn but advised may need to look closer at the maintenance regimen (now = 0) being used to achieve better control of airways disease with exertion.

## 2022-10-31 NOTE — Telephone Encounter (Signed)
I left a message for the patient to return my call.

## 2022-10-31 NOTE — Patient Instructions (Addendum)
Make sure you check your oxygen saturation  AT  your highest level of activity (not after you stop)   to be sure it stays over 90% and adjust  02 flow upward to maintain this level if needed but remember to turn it back to previous settings when you stop (to conserve your supply).   Only use your albuterol as a rescue medication to be used if you can't catch your breath by resting or doing a relaxed purse lip breathing pattern.  - The less you use it, the better it will work when you need it. - Ok to use up to 2 puffs  every 4 hours if you must but call for immediate appointment if use goes up over your usual need - Don't leave home without it !!  (think of it like starter fluid)  Also  Ok to try albuterol 15 min before an activity (on alternating days)  that you know would usually make you short of breath and see if it makes any difference and if makes none then don't take albuterol after activity unless you can't catch your breath as this means it's the resting that helps, not the albuterol.  Please remember to go to the  x-ray department  @  [facility] for your tests - we will call you with the results when they are available      Please schedule a follow up visit in 6  months but call sooner if needed - bring inhaler

## 2022-10-31 NOTE — Progress Notes (Signed)
Ricardo Lopez., male    DOB: 30-Apr-1975    MRN: [id number]   Brief patient profile:  85 yobm  never smoker  with MO did ok with HS aerobics middle pack @ wt 200 referred to pulmonary clinic in [place]  10/31/2022 by Lynnea Ferrier for 02 dep since covid 2021 (wt around 400 prior to)  and not right since   Peak at 600 lb 2022 > Mounjourno really helped    History of Present Illness  10/31/2022  Pulmonary/ 1st office eval/ [facility] / [place] [facility]  @ wt 422  Chief Complaint  Patient presents with   Consult  Dyspnea:  50 ft slt slope up to mb on 2lpm /   Cough: none  Sleep: flat bed/ 3 pillows on BiPAP  SABA use: once a week  02: 2lpm out /  Lung cancer screen: n/a   No obvious day to day or daytime pattern/variability or assoc excess/ purulent sputum or mucus plugs or hemoptysis or cp or chest tightness, subjective wheeze or overt sinus or hb symptoms.    Also denies any obvious fluctuation of symptoms with weather or environmental changes or other aggravating or alleviating factors except as outlined above   No unusual exposure hx or h/o childhood pna/ asthma or knowledge of premature birth.  Current Allergies, Complete Past Medical History, Past Surgical History, Family History, and Social History were reviewed in [employer] record.  ROS  The following are not active complaints unless bolded Hoarseness, sore throat, dysphagia, dental problems, itching, sneezing,  nasal congestion or discharge of excess mucus or purulent secretions, ear ache,   fever, chills, sweats,  intended wt loss or wt gain, classically pleuritic or exertional cp,  orthopnea pnd or arm/hand swelling  or leg swelling, presyncope, palpitations, abdominal pain, anorexia, nausea, vomiting, diarrhea  or change in bowel habits or change in bladder habits, change in stools or change in urine, dysuria, hematuria,  rash, arthralgias, visual complaints, headache, numbness, weakness or  ataxia or problems with walking or coordination,  change in mood or  memory.              Outpatient Medications Prior to Visit  Medication Sig Dispense Refill   Accu-Chek Softclix Lancets lancets Use to check blood sugar fasting, daily. 100 each 12   acetaminophen (TYLENOL) 325 MG tablet Take 650 mg by mouth every 6 (six) hours as needed for moderate pain.     albuterol (VENTOLIN HFA) 108 (90 Base) MCG/ACT inhaler Inhale 2 puffs into the lungs every 4 (four) hours as needed for wheezing or shortness of breath. 18 g 2   ascorbic acid (VITAMIN C) 500 MG tablet Take 1 tablet (500 mg total) by mouth daily. 30 tablet 1   atorvastatin (LIPITOR) 10 MG tablet TAKE 1 TABLET(10 MG) BY MOUTH DAILY 90 tablet 3   Blood Glucose Monitoring Suppl (ONE TOUCH ULTRA 2) w/Device KIT 1 each by Does not apply route 3 (three) times daily. 1 kit 1   buPROPion (WELLBUTRIN XL) 150 MG 24 hr tablet TAKE 1 TABLET(150 MG) BY MOUTH DAILY 30 tablet 5   cetirizine (ZYRTEC) 10 MG tablet TAKE 1 TABLET BY MOUTH DAILY 90 tablet 0   Cholecalciferol (VITAMIN D) 50 MCG (2000 UT) tablet Take 2,000 Units by mouth daily.     ciclopirox (PENLAC) 8 % solution Apply topically at bedtime. Apply over nail and surrounding skin. Apply daily over previous coat. After seven (7) days, may remove with alcohol  and continue cycle. 6.6 mL 5   clotrimazole-betamethasone (LOTRISONE) cream APPLY TOPICALLY TO THE AFFECTED AREA TWICE DAILY (Patient taking differently: Apply 1 Application topically 2 (two) times daily as needed (irritation).) 45 g 1   dexlansoprazole (DEXILANT) 60 MG capsule TAKE 1 CAPSULE(60 MG) BY MOUTH DAILY 30 capsule 3   dicyclomine (BENTYL) 10 MG capsule Take 1 capsule (10 mg total) by mouth every 6 (six) hours as needed for spasms. Hold if constipation 90 capsule 0   ferrous sulfate (FEROSUL) 325 (65 FE) MG tablet TAKE 1 TABLET BY MOUTH EVERY MORNING 90 tablet 1   fluticasone (FLONASE) 50 MCG/ACT nasal spray Place 2 sprays into  both nostrils daily as needed for allergies. 16 g 3   glucose blood test strip Use to check fasting blood sugar, daily. 100 each 12   hydrocortisone (ANUSOL-HC) 25 MG suppository Place 1 suppository (25 mg total) rectally 2 (two) times daily. 30 suppository 1   Lancets MISC Check fbs daily 100 each 11   metoprolol succinate (TOPROL-XL) 100 MG 24 hr tablet Take 1 tablet (100 mg total) by mouth daily. 30 tablet 1   Multiple Vitamin (MULTIVITAMIN) tablet Take 1 tablet by mouth daily.     ofloxacin (OCUFLOX) 0.3 % ophthalmic solution Place 1 drop into eye(s) 4 (four) times daily as needed for eye infection. 5 mL 0   Oxycodone HCl 10 MG TABS Take 1-2 tablets (10-20 mg total) by mouth 3 (three) times daily as needed. 180 tablet 0   potassium chloride SA (KLOR-CON M) 20 MEQ tablet TAKE 2 TABLETS BY MOUTH DAILY 30 tablet 0   prazosin (MINIPRESS) 2 MG capsule TAKE 1 CAPSULE(2 MG) BY MOUTH AT BEDTIME 30 capsule 3   sodium chloride (OCEAN) 0.65 % SOLN nasal spray Place 1 spray into both nostrils as needed for congestion. 88 mL 0   sucralfate (CARAFATE) 1 g tablet TAKE 1 TABLET WITH MEALS AND AT BEDTIME MAX OF 4/DAY (Patient taking differently: Take 1 g by mouth 3 (three) times daily as needed.) 360 tablet 0   tirzepatide (MOUNJARO) 15 MG/0.5ML Pen Inject 15 mg into the skin once a week. 6 mL 3   venlafaxine XR (EFFEXOR XR) 75 MG 24 hr capsule Take 3 capsules (225 mg total) by mouth daily with breakfast. 270 capsule 3   zolpidem (AMBIEN) 10 MG tablet TAKE 1 TABLET(10 MG) BY MOUTH AT BEDTIME AS NEEDED FOR SLEEP 30 tablet 1   zolpidem (AMBIEN) 10 MG tablet Take 1 tablet (10 mg total) by mouth at bedtime as needed for sleep. 30 tablet 0   Oxycodone HCl 10 MG TABS Take 1-2 tablets (10-20 mg total) by mouth 3 (three) times daily as needed. (Patient not taking: Reported on 10/31/2022) 180 tablet 0   Oxycodone HCl 10 MG TABS Take 1-2 tablets (10-20 mg total) by mouth 3 (three) times daily as needed. (Patient not  taking: Reported on 10/15/2022) 180 tablet 0   Oxycodone HCl 10 MG TABS Take 1-2 tablets (10-20 mg total) by mouth 3 (three) times daily as needed (Patient not taking: Reported on 10/15/2022) 180 tablet 0   Oxycodone HCl 10 MG TABS Take 1-2 tablets (10-20 mg total) by mouth 3 (three) times daily as needed. (Patient not taking: Reported on 10/15/2022) 180 tablet 0   Oxycodone HCl 10 MG TABS Take 1-2 tablets (10-20 mg total) by mouth 3 (three) times daily as needed. (Patient not taking: Reported on 10/15/2022) 180 tablet 0   No facility-administered medications prior to  visit.    Past Medical History:  Diagnosis Date   Anemia    Anxiety and depression    B12 deficiency    Back pain    COPD (chronic obstructive pulmonary disease) (HCC)    COVID    9/21- hypoxia requiring oxygen   Diabetes mellitus without complication (HCC)    Frequency of urination    GERD (gastroesophageal reflux disease)    Gluteal abscess    ecoli after covid hospitalization 2021   Headache    Hypercholesterolemia    Hypertension    Obesity    Oxygen dependent    o2@ 2L   Scar tissue    Scar Tissue on lungs due to Covid   Sleep apnea    Stomach ulcer    Tachycardia    Thoracic aortic aneurysm (HCC)       Objective:     BP (!) 152/70   Pulse (!) 107   Ht 5\' 8"  (1.727 m)   Wt (!) 422 lb (191.4 kg)   SpO2 94% Comment: 2 L  of O2  BMI 64.16 kg/m   SpO2: 94 % (2 L  of O2)  Amb MO (by BMI) amb bm nad     HEENT : Oropharynx  clear/ only 3 lower teeth remain/ M2-3 airway     Nasal turbinates nl    NECK :  without  apparent JVD/ palpable Nodes/TM    LUNGS: no acc muscle use,  Nl contour chest which is clear to A and P bilaterally without cough on insp or exp maneuvers   CV:  RRR  no s3 or murmur or increase in P2, and R> L pitting edema both LEs  ABD:  soft and nontender with nl inspiratory excursion in the supine position. No bruits or organomegaly appreciated   MS:  Nl gait/ ext warm without  deformities Or obvious joint restrictions  calf tenderness, cyanosis or clubbing    SKIN: warm and dry without lesions    NEURO:  alert, approp, nl sensorium with  no motor or cerebellar deficits apparent.      Assessment   DOE (dyspnea on exertion) Onset with Covid 2021  at wt prior to covid around 400 lb and peaked at 600 in 2022 - 10/31/2022  @ 423lb  Walked on 2lpm cont   x  3  lap(s) =  approx 450  ft  @ mod pace, stopped due to end of study  with lowest 02 sats 96% and no sob at all     As with many pts with MO, his lungs are in much better shape than his symptoms would otherwise suggest as he is accustomed to carrying a heavy load and is down 200 lbs from his peak so rec  1) keep walking (or doing recumbent bike if pain in joints limiting and adjusting the 02 flow to maintain sats > 90% if needed (see resp failure a/p_  2) use approp saba: Re SABA :  I spent extra time with pt today reviewing appropriate use of albuterol for prn use on exertion with the following points: 1) saba is for relief of sob that does not improve by walking a slower pace or resting but rather if the pt does not improve after trying this first. 2) If the pt is convinced, as many are, that saba helps recover from activity faster then it's easy to tell if this is the case by re-challenging : ie stop, take the inhaler, then p 5 minutes  try the exact same activity (intensity of workload) that just caused the symptoms and see if they are substantially diminished or not after saba 3) if there is an activity that reproducibly causes the symptoms, try the saba 15 min before the activity on alternate days   If in fact the saba really does help, then fine to continue to use it prn but advised may need to look closer at the maintenance regimen (now = 0) being used to achieve better control of airways disease with exertion.     Chronic respiratory failure with hypoxia since COVID As of 10/31/2022 using 2lpm cont with  sats 96% with ambulation   Advised: Make sure you check your oxygen saturation  AT  your highest level of activity (not after you stop)   to be sure it stays over 90% and adjust  02 flow upward to maintain this level if needed but remember to turn it back to previous settings when you stop (to conserve your supply).   Morbid (severe) obesity due to excess calories (HCC) Complicated by OSA/ dm/ hbp /hyperlipidemia / djd  Body mass index is 64.16 kg/m.  -   Lab Results  Component Value Date   TSH 4.680 (H) 05/18/2020      Contributing to doe and risk of GERD/ dvt/ PE  >>>   reviewed the need and the process to achieve and maintain neg calorie balance > defer f/u primary care including intermittently monitoring thyroid status     Doing better on Mounjaro but still has a long way to go/ advised   Each maintenance medication was reviewed in detail including emphasizing most importantly the difference between maintenance and prns and under what circumstances the prns are to be triggered using an action plan format where appropriate.  Total time for H and P, chart review, counseling, reviewing hfa device(s) , directly observing portions of ambulatory 02 saturation study/ and generating customized AVS unique to this office visit / same day charting = 60 min new pt eval                 Sandrea Hughs, MD 10/31/2022

## 2022-11-24 ENCOUNTER — Other Ambulatory Visit: Payer: Self-pay | Admitting: Family Medicine

## 2022-11-25 ENCOUNTER — Inpatient Hospital Stay: Payer: 59 | Attending: Hematology

## 2022-11-25 ENCOUNTER — Other Ambulatory Visit: Payer: Self-pay | Admitting: Family Medicine

## 2022-11-25 DIAGNOSIS — D500 Iron deficiency anemia secondary to blood loss (chronic): Secondary | ICD-10-CM

## 2022-11-25 DIAGNOSIS — D509 Iron deficiency anemia, unspecified: Secondary | ICD-10-CM | POA: Diagnosis present

## 2022-11-25 DIAGNOSIS — D563 Thalassemia minor: Secondary | ICD-10-CM

## 2022-11-25 DIAGNOSIS — D573 Sickle-cell trait: Secondary | ICD-10-CM | POA: Insufficient documentation

## 2022-11-25 DIAGNOSIS — R768 Other specified abnormal immunological findings in serum: Secondary | ICD-10-CM

## 2022-11-25 LAB — IRON AND TIBC
Iron: 66 ug/dL (ref 45–182)
Saturation Ratios: 22 % (ref 17.9–39.5)
TIBC: 295 ug/dL (ref 250–450)
UIBC: 229 ug/dL

## 2022-11-25 LAB — COMPREHENSIVE METABOLIC PANEL WITH GFR
ALT: 18 U/L (ref 0–44)
AST: 17 U/L (ref 15–41)
Albumin: 3.8 g/dL (ref 3.5–5.0)
Alkaline Phosphatase: 76 U/L (ref 38–126)
Anion gap: 12 (ref 5–15)
BUN: 12 mg/dL (ref 6–20)
CO2: 24 mmol/L (ref 22–32)
Calcium: 9.2 mg/dL (ref 8.9–10.3)
Chloride: 105 mmol/L (ref 98–111)
Creatinine, Ser: 1.36 mg/dL — ABNORMAL HIGH (ref 0.61–1.24)
GFR, Estimated: >60 mL/min (ref >60)
Glucose, Bld: 116 mg/dL — ABNORMAL HIGH (ref 70–99)
Potassium: 3.8 mmol/L (ref 3.5–5.1)
Sodium: 141 mmol/L (ref 135–145)
Total Bilirubin: 0.3 mg/dL (ref <1.2)
Total Protein: 7.9 g/dL (ref 6.5–8.1)

## 2022-11-25 LAB — CBC WITH DIFFERENTIAL/PLATELET
Abs Immature Granulocytes: 0.03 K/uL (ref 0.00–0.07)
Basophils Absolute: 0.1 K/uL (ref 0.0–0.1)
Basophils Relative: 0 %
Eosinophils Absolute: 0.1 K/uL (ref 0.0–0.5)
Eosinophils Relative: 1 %
HCT: 36.5 % — ABNORMAL LOW (ref 39.0–52.0)
Hemoglobin: 10.6 g/dL — ABNORMAL LOW (ref 13.0–17.0)
Immature Granulocytes: 0 %
Lymphocytes Relative: 32 %
Lymphs Abs: 3.8 K/uL (ref 0.7–4.0)
MCH: 20.3 pg — ABNORMAL LOW (ref 26.0–34.0)
MCHC: 29 g/dL — ABNORMAL LOW (ref 30.0–36.0)
MCV: 70.1 fL — ABNORMAL LOW (ref 80.0–100.0)
Monocytes Absolute: 1 K/uL (ref 0.1–1.0)
Monocytes Relative: 8 %
Neutro Abs: 7.1 K/uL (ref 1.7–7.7)
Neutrophils Relative %: 59 %
Platelets: 288 K/uL (ref 150–400)
RBC: 5.21 MIL/uL (ref 4.22–5.81)
RDW: 15 % (ref 11.5–15.5)
WBC: 11.9 K/uL — ABNORMAL HIGH (ref 4.0–10.5)
nRBC: 0 % (ref 0.0–0.2)

## 2022-11-25 LAB — FERRITIN: Ferritin: 169 ng/mL (ref 24–336)

## 2022-11-26 LAB — KAPPA/LAMBDA LIGHT CHAINS
Kappa free light chain: 24.8 mg/L — ABNORMAL HIGH (ref 3.3–19.4)
Kappa, lambda light chain ratio: 1.77 — ABNORMAL HIGH (ref 0.26–1.65)
Lambda free light chains: 14 mg/L (ref 5.7–26.3)

## 2022-11-26 NOTE — Telephone Encounter (Signed)
Requested medication (s) are due for refill today: yes  Requested medication (s) are on the active medication list: yes es Last refill:  multiple dates  Future visit scheduled: no  Notes to clinic:  Unable to refill per protocol, cannot delegate.      Requested Prescriptions  Pending Prescriptions Disp Refills   prazosin (MINIPRESS) 2 MG capsule [Pharmacy Med Name: PRAZOSIN 2MG  CAPSULES] 30 capsule 3    Sig: TAKE 1 CAPSULE(2 MG) BY MOUTH AT BEDTIME     Cardiovascular:  Alpha Blockers Failed - 11/24/2022  6:58 PM      Failed - Last BP in normal range    BP Readings from Last 1 Encounters:  10/31/22 (!) 152/70         Failed - Valid encounter within last 6 months    Recent Outpatient Visits           1 year ago Type 2 diabetes mellitus with hyperglycemia, without long-term current use of insulin (HCC)   [facility] [name], MD   2 years ago Acute midline low back pain, unspecified whether sciatica present   [facility] [name], MD   2 years ago Depression, major, single episode, moderate (HCC)   [facility] [name], MD   2 years ago SOB (shortness of breath) on exertion   [facility] Tanya Nones, Priscille Heidelberg, MD   2 years ago Upper respiratory tract infection, unspecified type   [facility] Cathlean Marseilles A, NP               zolpidem (AMBIEN) 10 MG tablet [Pharmacy Med Name: ZOLPIDEM 10MG  TABLETS] 30 tablet     Sig: TAKE 1 TABLET(10 MG) BY MOUTH AT BEDTIME AS NEEDED FOR SLEEP     Not Delegated - Psychiatry:  Anxiolytics/Hypnotics Failed - 11/24/2022  6:58 PM      Failed - This refill cannot be delegated      Failed - Urine Drug Screen completed in last 360 days      Failed - Valid encounter within last 6 months    Recent Outpatient Visits           1 year ago Type 2 diabetes mellitus with hyperglycemia, without long-term current use of insulin (HCC)    [facility] [name], MD   2 years ago Acute midline low back pain, unspecified whether sciatica present   [facility] [name], MD   2 years ago Depression, major, single episode, moderate (HCC)   [facility] [name], MD   2 years ago SOB (shortness of breath) on exertion   [facility] Pickard, Priscille Heidelberg, MD   2 years ago Upper respiratory tract infection, unspecified type   [facility] Valentino Nose, NP

## 2022-11-26 NOTE — Telephone Encounter (Signed)
Last OV 07/02/22 Requested Prescriptions  Pending Prescriptions Disp Refills   albuterol (VENTOLIN HFA) 108 (90 Base) MCG/ACT inhaler [Pharmacy Med Name: ALBUTEROL HFA INH (200 PUFFS) 6.7GM] 6.7 g     Sig: INHALE 2 PUFFS INTO THE LUNGS EVERY 4 HOURS AS NEEDED FOR WHEEZING OR SHORTNESS OF BREATH     Pulmonology:  Beta Agonists 2 Failed - 11/25/2022  7:28 AM      Failed - Last BP in normal range    BP Readings from Last 1 Encounters:  10/31/22 (!) 152/70         Failed - Valid encounter within last 12 months    Recent Outpatient Visits           1 year ago Type 2 diabetes mellitus with hyperglycemia, without long-term current use of insulin (HCC)   [facility] [name], MD   2 years ago Acute midline low back pain, unspecified whether sciatica present   [facility] [name], MD   2 years ago Depression, major, single episode, moderate (HCC)   [facility] [name], MD   2 years ago SOB (shortness of breath) on exertion   [facility] Pickard, Priscille Heidelberg, MD   2 years ago Upper respiratory tract infection, unspecified type   [facility] Valentino Nose, NP              Passed - Last Heart Rate in normal range    Pulse Readings from Last 1 Encounters:  10/31/22 (!) 107

## 2022-11-27 ENCOUNTER — Other Ambulatory Visit (HOSPITAL_COMMUNITY): Payer: Self-pay

## 2022-11-27 ENCOUNTER — Ambulatory Visit (HOSPITAL_COMMUNITY): Payer: Medicare HMO

## 2022-11-27 ENCOUNTER — Other Ambulatory Visit: Payer: Self-pay | Admitting: Internal Medicine

## 2022-11-27 ENCOUNTER — Other Ambulatory Visit: Payer: Self-pay | Admitting: Student

## 2022-11-27 DIAGNOSIS — R1013 Epigastric pain: Secondary | ICD-10-CM

## 2022-11-27 DIAGNOSIS — K219 Gastro-esophageal reflux disease without esophagitis: Secondary | ICD-10-CM

## 2022-11-27 LAB — PROTEIN ELECTROPHORESIS, SERUM
A/G Ratio: 1 (ref 0.7–1.7)
Albumin ELP: 3.8 g/dL (ref 2.9–4.4)
Alpha-1-Globulin: 0.2 g/dL (ref 0.0–0.4)
Alpha-2-Globulin: 0.9 g/dL (ref 0.4–1.0)
Beta Globulin: 1.3 g/dL (ref 0.7–1.3)
Gamma Globulin: 1.3 g/dL (ref 0.4–1.8)
Globulin, Total: 3.7 g/dL (ref 2.2–3.9)
Total Protein ELP: 7.5 g/dL (ref 6.0–8.5)

## 2022-11-28 ENCOUNTER — Other Ambulatory Visit (HOSPITAL_COMMUNITY): Payer: Self-pay

## 2022-11-28 ENCOUNTER — Ambulatory Visit (HOSPITAL_COMMUNITY)
Admission: RE | Admit: 2022-11-28 | Discharge: 2022-11-28 | Disposition: A | Discharge status: Home / Self Care | Payer: 59 | Source: Ambulatory Visit | Attending: Student | Admitting: Student

## 2022-11-28 DIAGNOSIS — I7121 Aneurysm of the ascending aorta, without rupture: Secondary | ICD-10-CM | POA: Diagnosis present

## 2022-11-28 MED ORDER — OXYCODONE HCL 10 MG PO TABS
10.0000 mg | ORAL_TABLET | Freq: Three times a day (TID) | ORAL | 0 refills | Status: DC | PRN
Start: 2022-11-28 — End: 2022-12-02
  Filled 2022-11-28: qty 180, 30d supply, fill #0

## 2022-11-28 MED ORDER — IOHEXOL 350 MG/ML SOLN
75.0000 mL | Freq: Once | INTRAVENOUS | Status: AC | PRN
  Administered 2022-11-28: 75 mL via INTRAVENOUS

## 2022-12-01 NOTE — Progress Notes (Unsigned)
[facility] [address], [place] [address]   CLINIC:  Medical Oncology/Hematology  PCP:  [name], MD [address] [place] [address] [phone]   REASON FOR VISIT: Microcytic anemia  INTERVAL HISTORY:   Ricardo Lopez 47 y.o. male returns for routine follow-up of microcytic anemia.  He was last seen by Rojelio Brenner PA-C on 07/29/2022.    Patient denies any acute changes in his health, but reports that he "has good days and bad days," as he continues to cope with multiple chronic comorbidities, particularly COVID induced lung injury and oxygen dependence.  He reports that his energy waxes and wanes, but overall he remains moderately fatigued.  Ice pica has resolved.  He continues to take iron tablet daily, but this is causing some constipation.  He denies any symptoms of pain crises.  He continues to have intermittent hematochezia (2-3 x weekly), which was initially thought to be from hemorrhoids but has persisted despite banding.  He has % energy and low appetite.   His weight has been stable since his last visit.  ASSESSMENT & PLAN:  1.   Microcytic anemia, sickle cell trait + alpha thalassemia trait - He reports that he has been anemic all his life.  No prior history of blood transfusion. - Personal history of sickle cell trait. - EGD (04/15/2022): Gastritis, otherwise normal esophagus and duodenum. - Flexible sigmoidoscopy (04/15/2022): Internal hemorrhoids.  He is being considered for referral to colorectal surgery as he has not responded to conservative measures / hemorrhoid banding. - Hematology workup (04/02/2022): Hemoglobin fractionation cascade/hemoglobin solubility confirms sickle cell trait, but with Hgb-S observed at lower concentration than expected, which suggests possible sickle cell trait compounded by alpha thalassemia, or sickle cell trait with iron deficiency anemia. Normal copper, folate, B12, MMA. Iron panel (03/22/2022)  with ferritin 153, iron saturation 17% Negative DAT.  Reticulocytes 1.2%.  Normal LDH. Mildly elevated kappa free light chains 27.6, normal lambda 16.8, normal FLC ratio 1.64.  SPEP normal. Alpha thalassemia DNA analysis consistent with carrier of alpha thalassemia single gene deviation - Received IV Feraheme x 1 on 04/29/2022. - Most recent labs (11/25/2022): Hgb 10.6/MCV 70.1 (no significant change after IV iron).  Ferritin 169, iron saturation 22%. Repeat testing of light chains with mildly elevated kappa 24.8, normal lambda 14.0, and minimally elevated ratio 1.77.  SPEP normal.  Immunofixation normal. - Persistent hematochezia, which was initially thought to be from hemorrhoids but has persisted despite banding. - Ice pica resolved.  Moderate persistent fatigue. - He denies any symptoms of pain crises. - He is taking iron tablet daily for the past 3 years, but this is causing constipation - Microcytic anemia primarily from sickle cell trait, alpha thalassemia trait, and iron deficiency related to rectal bleeding  - Discussed with patient that patients with sickle cell trait are at increased risk of chronic kidney disease. 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  Left kidney is normal in location, no suspicious masses identified.  No evidence of hydronephrosis. - PLAN: Recommend switching to every other day iron for better tolerance. - We will recheck CBC/D, CMP, iron panel in 6 months  - If any significant worsening of creatinine in the future, we will refer to nephrology for further management. - No evidence of plasma cell dyscrasia based on repeat light  chain/immunofixation/SPEP.  No further testing at this time.    2.  Social/family history: - He is currently on disability since long COVID (initial diagnosis  09/28/2019), which left him on continuous oxygen via nasal cannula.  He worked as a Architect prior to disability.  Non-smoker. - Mother has anemia.  Maternal grandmother had sickle cell trait.  Son has sickle cell trait.  Maternal grandfather had lung cancer.    PLAN SUMMARY: >> Labs in 6 months = CBC/D, CMP, ferritin, iron/TIBC >> PHONE visit in 6 months (1 week after labs)  ** Last office visit 12/02/22 (alternating phone and office visits)     REVIEW OF SYSTEMS:  Review of Systems  Constitutional:  Positive for fatigue. Negative for appetite change, chills, diaphoresis, fever and unexpected weight change.  HENT:   Negative for lump/mass, nosebleeds and trouble swallowing.   Eyes:  Negative for eye problems.  Respiratory:  Positive for shortness of breath (2L supplemental O2). Negative for cough and hemoptysis.   Cardiovascular:  Positive for leg swelling. Negative for chest pain (at times, none today) and palpitations.  Gastrointestinal:  Positive for blood in stool, constipation and nausea. Negative for abdominal pain, diarrhea and vomiting.  Genitourinary:  Negative for hematuria.   Musculoskeletal:  Positive for arthralgias.  Skin: Negative.   Neurological:  Positive for dizziness and headaches. Negative for light-headedness.  Hematological:  Does not bruise/bleed easily.  Psychiatric/Behavioral:  Positive for depression and sleep disturbance. Negative for suicidal ideas. The patient is nervous/anxious.      PHYSICAL EXAM:  ECOG PERFORMANCE STATUS: 2 - Symptomatic, <50% confined to bed  Vitals:   12/02/22 1511  BP: 134/78  Pulse: 89  Resp: 20  Temp: 99.3 F (37.4 C)  SpO2: 97%   Filed Weights   12/02/22 1504  Weight: (!) 424 lb (192.3 kg)   Physical Exam Constitutional:      Appearance: Normal appearance. He is morbidly obese.     Comments: Nasal cannula in place (2 L supplemental O2)   Cardiovascular:     Heart sounds: Normal heart sounds.  Pulmonary:      Breath sounds: Normal breath sounds. Decreased air movement present.  Neurological:     General: No focal deficit present.     Mental Status: Mental status is at baseline.  Psychiatric:        Behavior: Behavior normal. Behavior is cooperative.     PAST MEDICAL/SURGICAL HISTORY:  Past Medical History:  Diagnosis Date   Anemia    Anxiety and depression    B12 deficiency    Back pain    COPD (chronic obstructive pulmonary disease) (HCC)    COVID    9/21- hypoxia requiring oxygen   Diabetes mellitus without complication (HCC)    Frequency of urination    GERD (gastroesophageal reflux disease)    Gluteal abscess    ecoli after covid hospitalization 2021   Headache    Hypercholesterolemia    Hypertension    Obesity    Oxygen dependent    o2@ 2L   Scar tissue    Scar Tissue on lungs due to Covid   Sleep apnea    Stomach ulcer    Tachycardia    Thoracic aortic aneurysm [facility])    Past Surgical History:  Procedure Laterality Date   BIOPSY  05/17/2021   Procedure: BIOPSY;  Surgeon: Rachael Fee, MD;  Location: [facility];  Service: Gastroenterology;;  EGD and COLON   BIOPSY  09/16/2022   Procedure:  BIOPSY;  Surgeon: Lanelle Bal, DO;  Location: [facility];  Service: Endoscopy;;   COLONOSCOPY WITH PROPOFOL N/A 05/17/2021   Surgeon: Rachael Fee, MD; mild erythema and granularity of the colonic mucosa with benign biopsies, internal hemorrhoids suspected to be etiology of rectal bleeding.   COLONOSCOPY WITH PROPOFOL N/A 09/16/2022   Procedure: COLONOSCOPY WITH PROPOFOL;  Surgeon: Lanelle Bal, DO;  Location: [facility];  Service: Endoscopy;  Laterality: N/A;  10:15 am, asa 3   ESOPHAGOGASTRODUODENOSCOPY (EGD) WITH PROPOFOL N/A 05/17/2021   Surgeon: Rachael Fee, MD; very mild, nonspecific distal gastritis s/p biopsied, otherwise normal exam.  Gastric biopsy was benign, negative for H. pylori.   ESOPHAGOGASTRODUODENOSCOPY (EGD) WITH PROPOFOL N/A  04/15/2022   Procedure: ESOPHAGOGASTRODUODENOSCOPY (EGD) WITH PROPOFOL;  Surgeon: Lanelle Bal, DO;  Location: [facility];  Service: Endoscopy;  Laterality: N/A;   FLEXIBLE SIGMOIDOSCOPY N/A 04/15/2022   Procedure: FLEXIBLE SIGMOIDOSCOPY;  Surgeon: Lanelle Bal, DO;  Location: [facility];  Service: Endoscopy;  Laterality: N/A;  100pm, asa 3   GIVENS CAPSULE STUDY N/A 11/16/2021   Surgeon: Lanelle Bal, DO; poor and only able to identify first gastric image.   GIVENS CAPSULE STUDY N/A 04/15/2022   Procedure: GIVENS CAPSULE STUDY;  Surgeon: Lanelle Bal, DO;  Location: [facility];  Service: Endoscopy;  Laterality: N/A;    SOCIAL HISTORY:  Social History   Socioeconomic History   Marital status: Married    Spouse name: Not on file   Number of children: Not on file   Years of education: Not on file   Highest education level: Not on file  Occupational History   Occupation: stay at home spouse  Tobacco Use   Smoking status: Never   Smokeless tobacco: Never  Vaping Use   Vaping status: Never Used  Substance and Sexual Activity   Alcohol use: Not Currently    Comment: occasional   Drug use: Not Currently    Types: Marijuana    Comment: occasionally   Sexual activity: Yes  Other Topics Concern   Not on file  Social History Narrative   Lives at home with wife and two children.  Four children in all.     Social Determinants of Health   Financial Resource Strain: Not on file  Food Insecurity: No Food Insecurity (04/02/2022)   Hunger Vital Sign    Worried About Running Out of Food in the Last Year: Never true    Ran Out of Food in the Last Year: Never true  Transportation Needs: No Transportation Needs (04/02/2022)   PRAPARE - Administrator, Civil Service (Medical): No    Lack of Transportation (Non-Medical): No  Physical Activity: Not on file  Stress: Not on file  Social Connections: Not on file  Intimate Partner Violence: Not At Risk  (04/02/2022)   Humiliation, Afraid, Rape, and Kick questionnaire    Fear of Current or Ex-Partner: No    Emotionally Abused: No    Physically Abused: No    Sexually Abused: No    FAMILY HISTORY:  Family History  Problem Relation Age of Onset   Diabetes Mother    Hypertension Mother    Hyperlipidemia Mother    Thyroid disease Mother    Diabetes Father    Hyperlipidemia Father    Hypertension Father    Depression Father    Anxiety disorder Father    Sleep apnea Father    Colon cancer Neg Hx  Stomach cancer Neg Hx     CURRENT MEDICATIONS:  Outpatient Encounter Medications as of 12/02/2022  Medication Sig   Accu-Chek Softclix Lancets lancets Use to check blood sugar fasting, daily.   acetaminophen (TYLENOL) 325 MG tablet Take 650 mg by mouth every 6 (six) hours as needed for moderate pain.   albuterol (VENTOLIN HFA) 108 (90 Base) MCG/ACT inhaler INHALE 2 PUFFS INTO THE LUNGS EVERY 4 HOURS AS NEEDED FOR WHEEZING OR SHORTNESS OF BREATH   ascorbic acid (VITAMIN C) 500 MG tablet Take 1 tablet (500 mg total) by mouth daily.   atorvastatin (LIPITOR) 10 MG tablet TAKE 1 TABLET(10 MG) BY MOUTH DAILY   Blood Glucose Monitoring Suppl (ONE TOUCH ULTRA 2) w/Device KIT 1 each by Does not apply route 3 (three) times daily.   buPROPion (WELLBUTRIN XL) 150 MG 24 hr tablet TAKE 1 TABLET(150 MG) BY MOUTH DAILY   cetirizine (ZYRTEC) 10 MG tablet TAKE 1 TABLET BY MOUTH DAILY   Cholecalciferol (VITAMIN D) 50 MCG (2000 UT) tablet Take 2,000 Units by mouth daily.   ciclopirox (PENLAC) 8 % solution Apply topically at bedtime. Apply over nail and surrounding skin. Apply daily over previous coat. After seven (7) days, may remove with alcohol and continue cycle.   clotrimazole-betamethasone (LOTRISONE) cream APPLY TOPICALLY TO THE AFFECTED AREA TWICE DAILY (Patient taking differently: Apply 1 Application topically 2 (two) times daily as needed (irritation).)   dexlansoprazole (DEXILANT) 60 MG capsule TAKE 1  CAPSULE(60 MG) BY MOUTH DAILY   dicyclomine (BENTYL) 10 MG capsule Take 1 capsule (10 mg total) by mouth every 6 (six) hours as needed for spasms. Hold if constipation   ferrous sulfate (FEROSUL) 325 (65 FE) MG tablet TAKE 1 TABLET BY MOUTH EVERY MORNING   fluticasone (FLONASE) 50 MCG/ACT nasal spray Place 2 sprays into both nostrils daily as needed for allergies.   glucose blood test strip Use to check fasting blood sugar, daily.   hydrocortisone (ANUSOL-HC) 25 MG suppository Place 1 suppository (25 mg total) rectally 2 (two) times daily.   Lancets MISC Check fbs daily   metoprolol succinate (TOPROL-XL) 100 MG 24 hr tablet TAKE 1 TABLET(100 MG) BY MOUTH DAILY   Multiple Vitamin (MULTIVITAMIN) tablet Take 1 tablet by mouth daily.   ofloxacin (OCUFLOX) 0.3 % ophthalmic solution Place 1 drop into eye(s) 4 (four) times daily as needed for eye infection.   Oxycodone HCl 10 MG TABS Take 1-2 tablets (10-20 mg total) by mouth 3 (three) times daily as needed.   potassium chloride SA (KLOR-CON M) 20 MEQ tablet TAKE 2 TABLETS BY MOUTH DAILY   prazosin (MINIPRESS) 2 MG capsule TAKE 1 CAPSULE(2 MG) BY MOUTH AT BEDTIME   sodium chloride (OCEAN) 0.65 % SOLN nasal spray Place 1 spray into both nostrils as needed for congestion.   sucralfate (CARAFATE) 1 g tablet TAKE 1 TABLET WITH MEALS AND AT BEDTIME MAX OF 4/DAY (Patient taking differently: Take 1 g by mouth 3 (three) times daily as needed.)   tirzepatide (MOUNJARO) 15 MG/0.5ML Pen Inject 15 mg into the skin once a week.   venlafaxine XR (EFFEXOR XR) 75 MG 24 hr capsule Take 3 capsules (225 mg total) by mouth daily with breakfast.   zolpidem (AMBIEN) 10 MG tablet TAKE 1 TABLET(10 MG) BY MOUTH AT BEDTIME AS NEEDED FOR SLEEP   zolpidem (AMBIEN) 10 MG tablet Take 1 tablet (10 mg total) by mouth at bedtime as needed for sleep.   [DISCONTINUED] Oxycodone HCl 10 MG TABS Take 1-2  tablets (10-20 mg total) by mouth 3 (three) times daily as needed. (Patient not taking:  Reported on 10/15/2022)   [DISCONTINUED] Oxycodone HCl 10 MG TABS Take 1-2 tablets (10-20 mg total) by mouth 3 (three) times daily as needed (Patient not taking: Reported on 10/15/2022)   [DISCONTINUED] Oxycodone HCl 10 MG TABS Take 1-2 tablets (10-20 mg total) by mouth 3 (three) times daily as needed. (Patient not taking: Reported on 10/15/2022)   [DISCONTINUED] Oxycodone HCl 10 MG TABS Take 1-2 tablets (10-20 mg total) by mouth 3 (three) times daily as needed.   [DISCONTINUED] Oxycodone HCl 10 MG TABS Take 1-2 tablets (10-20 mg total) by mouth 3 (three) times daily as needed.   No facility-administered encounter medications on file as of 12/02/2022.    ALLERGIES:  No Known Allergies  LABORATORY DATA:  I have reviewed the labs as listed.  CBC    Component Value Date/Time   WBC 11.9 (H) 11/25/2022 1327   RBC 5.21 11/25/2022 1327   HGB 10.6 (L) 11/25/2022 1327   HGB 10.2 (L) 05/18/2020 1357   HCT 36.5 (L) 11/25/2022 1327   HCT 34.9 (L) 05/18/2020 1357   PLT 288 11/25/2022 1327   MCV 70.1 (L) 11/25/2022 1327   MCV 69 (L) 05/18/2020 1357   MCH 20.3 (L) 11/25/2022 1327   MCHC 29.0 (L) 11/25/2022 1327   RDW 15.0 11/25/2022 1327   RDW 16.0 (H) 05/18/2020 1357   LYMPHSABS 3.8 11/25/2022 1327   LYMPHSABS 2.0 05/18/2020 1357   MONOABS 1.0 11/25/2022 1327   EOSABS 0.1 11/25/2022 1327   EOSABS 0.1 05/18/2020 1357   BASOSABS 0.1 11/25/2022 1327   BASOSABS 0.0 05/18/2020 1357      Latest Ref Rng & Units 11/25/2022    1:27 PM 07/02/2022    9:18 AM 06/28/2022   12:21 PM  CMP  Glucose 70 - 99 mg/dL 119  147  829   BUN 6 - 20 mg/dL 12  14  13    Creatinine 0.61 - 1.24 mg/dL 5.62  1.30  8.65   Sodium 135 - 145 mmol/L 141  141  140   Potassium 3.5 - 5.1 mmol/L 3.8  4.6  4.2   Chloride 98 - 111 mmol/L 105  103  104   CO2 22 - 32 mmol/L 24  30  29    Calcium 8.9 - 10.3 mg/dL 9.2  9.4  9.1   Total Protein 6.5 - 8.1 g/dL 7.9  6.9  7.7   Total Bilirubin <1.2 mg/dL 0.3  0.2  0.4   Alkaline Phos  38 - 126 U/L 76   80   AST 15 - 41 U/L 17  17  16    ALT 0 - 44 U/L 18  23  21      DIAGNOSTIC IMAGING:  I have independently reviewed the relevant imaging and discussed with the patient.   WRAP UP:  All questions were answered. The patient knows to call the clinic with any problems, questions or concerns.  Medical decision making: Moderate  Time spent on visit: I spent 20 minutes counseling the patient face to face. The total time spent in the appointment was 30 minutes and more than 50% was on counseling.  Carnella Guadalajara, PA-C  12/02/22 7:13 PM

## 2022-12-02 ENCOUNTER — Encounter: Payer: Self-pay | Admitting: Physician Assistant

## 2022-12-02 ENCOUNTER — Inpatient Hospital Stay (HOSPITAL_BASED_OUTPATIENT_CLINIC_OR_DEPARTMENT_OTHER): Payer: 59 | Admitting: Physician Assistant

## 2022-12-02 VITALS — BP 134/78 | HR 89 | Temp 99.3°F | Resp 20 | Wt >= 6400 oz

## 2022-12-02 DIAGNOSIS — D563 Thalassemia minor: Secondary | ICD-10-CM

## 2022-12-02 DIAGNOSIS — D509 Iron deficiency anemia, unspecified: Secondary | ICD-10-CM | POA: Diagnosis not present

## 2022-12-02 DIAGNOSIS — D560 Alpha thalassemia: Secondary | ICD-10-CM | POA: Diagnosis not present

## 2022-12-02 DIAGNOSIS — D500 Iron deficiency anemia secondary to blood loss (chronic): Secondary | ICD-10-CM

## 2022-12-02 DIAGNOSIS — D573 Sickle-cell trait: Secondary | ICD-10-CM | POA: Diagnosis not present

## 2022-12-02 LAB — IMMUNOFIXATION ELECTROPHORESIS
IgA: 318 mg/dL (ref 90–386)
IgG (Immunoglobin G), Serum: 1394 mg/dL (ref 603–1613)
IgM (Immunoglobulin M), Srm: 64 mg/dL (ref 20–172)
Total Protein ELP: 7.4 g/dL (ref 6.0–8.5)

## 2022-12-02 NOTE — Patient Instructions (Signed)
[facility] at [facility] **VISIT SUMMARY & IMPORTANT INSTRUCTIONS **   You were seen today by Rojelio Brenner PA-C for your anemia.  Your anemia is due to a combination of sickle cell trait and alpha thalassemia trait.  You also have some anemia from your blood loss and iron deficiency. You do not need any treatment for your anemia at this time. You can change your iron tablet to every other day. We will continue to monitor your labs and see you for a follow up visit in 6 months.  FOLLOW-UP APPOINTMENT: Labs and phone visit in 6 months  ** Thank you for trusting me with your healthcare!  I strive to provide all of my patients with quality care at each visit.  If you receive a survey for this visit, I would be so grateful to you for taking the time to provide feedback.  Thank you in advance!  ~ Sutton Hirsch                   Dr. Doreatha Massed   &   Rojelio Brenner, PA-C   - - - - - - - - - - - - - - - - - -    Thank you for choosing [facility] at [facility] to provide your oncology and hematology care.  To afford each patient quality time with our provider, please arrive at least 15 minutes before your scheduled appointment time.   If you have a lab appointment with the Cancer Center please come in thru the Main Entrance and check in at the main information desk.  You need to re-schedule your appointment should you arrive 10 or more minutes late.  We strive to give you quality time with our providers, and arriving late affects you and other patients whose appointments are after yours.  Also, if you no show three or more times for appointments you may be dismissed from the clinic at the providers discretion.     Again, thank you for choosing [facility].  Our hope is that these requests will decrease the amount of time that you wait before being seen by our physicians.        _____________________________________________________________  Should you have questions after your visit to [facility], please contact our office at [phone] and follow the prompts.  Our office hours are 8:00 a.m. and 4:30 p.m. Monday - Friday.  Please note that voicemails left after 4:00 p.m. may not be returned until the following business day.  We are closed weekends and major holidays.  You do have access to a nurse 24-7, just call the main number to the clinic [phone] and do not press any options, hold on the line and a nurse will answer the phone.    For prescription refill requests, have your pharmacy contact our office and allow 72 hours.

## 2022-12-05 ENCOUNTER — Encounter: Payer: Self-pay | Admitting: Family Medicine

## 2022-12-09 ENCOUNTER — Other Ambulatory Visit: Payer: Self-pay | Admitting: Family Medicine

## 2022-12-09 MED ORDER — PRAZOSIN HCL 2 MG PO CAPS: 2.0000 mg | ORAL_CAPSULE | Freq: Every day | ORAL | 5 refills | Status: DC

## 2022-12-09 MED ORDER — ZOLPIDEM TARTRATE 10 MG PO TABS: 10.0000 mg | ORAL_TABLET | Freq: Every evening | ORAL | 2 refills | Status: DC | PRN

## 2022-12-27 ENCOUNTER — Other Ambulatory Visit (HOSPITAL_BASED_OUTPATIENT_CLINIC_OR_DEPARTMENT_OTHER): Payer: Self-pay

## 2022-12-27 ENCOUNTER — Other Ambulatory Visit (HOSPITAL_COMMUNITY): Payer: Self-pay

## 2022-12-27 MED ORDER — OXYCODONE HCL 10 MG PO TABS
10.0000 mg | ORAL_TABLET | Freq: Three times a day (TID) | ORAL | 0 refills | Status: DC | PRN
  Filled 2022-12-28: qty 180, 30d supply, fill #0
  Filled ????-??-??: fill #0

## 2022-12-28 ENCOUNTER — Other Ambulatory Visit (HOSPITAL_BASED_OUTPATIENT_CLINIC_OR_DEPARTMENT_OTHER): Payer: Self-pay

## 2023-01-23 DIAGNOSIS — Z79899 Other long term (current) drug therapy: Secondary | ICD-10-CM | POA: Diagnosis not present

## 2023-01-23 DIAGNOSIS — G8929 Other chronic pain: Secondary | ICD-10-CM | POA: Diagnosis not present

## 2023-01-23 DIAGNOSIS — M25552 Pain in left hip: Secondary | ICD-10-CM | POA: Diagnosis not present

## 2023-01-23 DIAGNOSIS — E119 Type 2 diabetes mellitus without complications: Secondary | ICD-10-CM | POA: Diagnosis not present

## 2023-01-23 DIAGNOSIS — M25551 Pain in right hip: Secondary | ICD-10-CM | POA: Diagnosis not present

## 2023-01-23 DIAGNOSIS — Z6841 Body Mass Index (BMI) 40.0 and over, adult: Secondary | ICD-10-CM | POA: Diagnosis not present

## 2023-01-24 ENCOUNTER — Other Ambulatory Visit (HOSPITAL_COMMUNITY): Payer: Self-pay

## 2023-01-24 MED ORDER — OXYCODONE HCL 10 MG PO TABS
10.0000 mg | ORAL_TABLET | Freq: Three times a day (TID) | ORAL | 0 refills | Status: DC | PRN
  Filled 2023-01-27: qty 180, 30d supply, fill #0

## 2023-01-27 ENCOUNTER — Other Ambulatory Visit (HOSPITAL_COMMUNITY): Payer: Self-pay

## 2023-01-27 ENCOUNTER — Other Ambulatory Visit: Payer: Self-pay

## 2023-01-28 DIAGNOSIS — Z79899 Other long term (current) drug therapy: Secondary | ICD-10-CM | POA: Diagnosis not present

## 2023-02-25 ENCOUNTER — Other Ambulatory Visit (HOSPITAL_COMMUNITY): Payer: Self-pay

## 2023-02-25 ENCOUNTER — Other Ambulatory Visit: Payer: Self-pay

## 2023-02-25 ENCOUNTER — Ambulatory Visit (INDEPENDENT_AMBULATORY_CARE_PROVIDER_SITE_OTHER): Payer: 59 | Admitting: Family Medicine

## 2023-02-25 ENCOUNTER — Encounter: Payer: Self-pay | Admitting: Hematology

## 2023-02-25 ENCOUNTER — Encounter: Payer: Self-pay | Admitting: Family Medicine

## 2023-02-25 VITALS — BP 132/80 | HR 89 | Temp 98.4°F | Ht 68.0 in | Wt >= 6400 oz

## 2023-02-25 DIAGNOSIS — R0982 Postnasal drip: Secondary | ICD-10-CM

## 2023-02-25 DIAGNOSIS — D649 Anemia, unspecified: Secondary | ICD-10-CM | POA: Diagnosis not present

## 2023-02-25 DIAGNOSIS — R5383 Other fatigue: Secondary | ICD-10-CM

## 2023-02-25 DIAGNOSIS — R197 Diarrhea, unspecified: Secondary | ICD-10-CM

## 2023-02-25 DIAGNOSIS — E1165 Type 2 diabetes mellitus with hyperglycemia: Secondary | ICD-10-CM

## 2023-02-25 DIAGNOSIS — Z7985 Long-term (current) use of injectable non-insulin antidiabetic drugs: Secondary | ICD-10-CM

## 2023-02-25 DIAGNOSIS — F411 Generalized anxiety disorder: Secondary | ICD-10-CM

## 2023-02-25 MED ORDER — FAMOTIDINE 40 MG PO TABS: 40.0000 mg | ORAL_TABLET | Freq: Every day | ORAL | 11 refills | Status: AC

## 2023-02-25 MED ORDER — BUSPIRONE HCL 7.5 MG PO TABS: 7.5000 mg | ORAL_TABLET | Freq: Two times a day (BID) | ORAL | 3 refills | Status: DC

## 2023-02-25 MED ORDER — CETIRIZINE HCL 10 MG PO TABS: 10.0000 mg | ORAL_TABLET | Freq: Every day | ORAL | 0 refills | Status: DC

## 2023-02-25 MED ORDER — OXYCODONE HCL 10 MG PO TABS
10.0000 mg | ORAL_TABLET | Freq: Three times a day (TID) | ORAL | 0 refills | Status: DC | PRN
  Filled 2023-02-25 – 2023-02-26 (×2): qty 180, 30d supply, fill #0

## 2023-02-25 NOTE — Progress Notes (Signed)
 Subjective:    Patient ID: Ricardo FORBES Joshua Mickey., male    DOB: 04-02-1975, 48 y.o.   MRN: [id number] Patient has a history of type 2 diabetes.  He is here today for follow-up.  He is also recently seen GI due to persistent loss that should the hemorrhoids.  Patient's hemoglobin stays between 9 and 10. Wt Readings from Last 3 Encounters:  02/25/23 (!) 419 lb (190.1 kg)  12/02/22 (!) 424 lb (192.3 kg)  10/31/22 (!) 422 lb (191.4 kg)   Patient weighs 419 pounds today.  I last saw the patient in June 2024.  At that time he was 446 pounds.  Therefore he only 7 pounds since I last saw him on Mounjaro .  Unfortunately he reports worsening depression and anxiety.  He is asking to take Xanax as needed for anxiety.  I explained to the patient that I am hesitant to prescribe Xanax due to his use of chronic narcotics for pain.  He is currently on 60 mg of oxycodone  every day and also takes Ambien  to help him sleep.  I explained that Xanax is habit-forming and could lead to respiratory depression.  The patient states that he is battling anxiety on a daily basis.  He generally feels anxious and also has breakthrough panic attacks.  He also reports depression.  He also complains of fatigue.  He states that he has no libido no desire.  He denies any erectile dysfunction.  He also wants to be checked for celiac disease.  He states that he has diarrhea and abdominal pain after eating certain foods particularly high in carbohydrates.  His daughter was recently diagnosed with celiac disease. Past Medical History:  Diagnosis Date   Anemia    Anxiety and depression    B12 deficiency    Back pain    COPD (chronic obstructive pulmonary disease) (HCC)    COVID    9/21- hypoxia requiring oxygen   Diabetes mellitus without complication (HCC)    Frequency of urination    GERD (gastroesophageal reflux disease)    Gluteal abscess    ecoli after covid hospitalization 2021   Headache    Hypercholesterolemia     Hypertension    Obesity    Oxygen dependent    o2@ 2L   Scar tissue    Scar Tissue on lungs due to Covid   Sleep apnea    Stomach ulcer    Tachycardia    Thoracic aortic aneurysm [facility])    Past Surgical History:  Procedure Laterality Date   BIOPSY  05/17/2021   Procedure: BIOPSY;  Surgeon: Teressa Toribio SQUIBB, MD;  Location: [facility];  Service: Gastroenterology;;  EGD and COLON   BIOPSY  09/16/2022   Procedure: BIOPSY;  Surgeon: Cindie Carlin POUR, DO;  [facility];  Service: Endoscopy;;   COLONOSCOPY WITH PROPOFOL  N/A 05/17/2021   Surgeon: Teressa Toribio SQUIBB, MD; mild erythema and granularity of the colonic mucosa with benign biopsies, internal hemorrhoids suspected to be etiology of rectal bleeding.   COLONOSCOPY WITH PROPOFOL  N/A 09/16/2022   Procedure: COLONOSCOPY WITH PROPOFOL ;  Surgeon: Cindie Carlin POUR, DO;  [facility];  Service: Endoscopy;  Laterality: N/A;  10:15 am, asa 3   ESOPHAGOGASTRODUODENOSCOPY (EGD) WITH PROPOFOL  N/A 05/17/2021   Surgeon: Teressa Toribio SQUIBB, MD; very mild, nonspecific distal gastritis s/p biopsied, otherwise normal exam.  Gastric biopsy was benign, negative for H. pylori.   ESOPHAGOGASTRODUODENOSCOPY (EGD) WITH PROPOFOL  N/A 04/15/2022   Procedure: ESOPHAGOGASTRODUODENOSCOPY (EGD) WITH  PROPOFOL ;  Surgeon: Cindie Carlin POUR, DO;  [facility];  Service: Endoscopy;  Laterality: N/A;   FLEXIBLE SIGMOIDOSCOPY N/A 04/15/2022   Procedure: FLEXIBLE SIGMOIDOSCOPY;  Surgeon: Cindie Carlin POUR, DO;  [facility];  Service: Endoscopy;  Laterality: N/A;  100pm, asa 3   GIVENS CAPSULE STUDY N/A 11/16/2021   Surgeon: Cindie Carlin POUR, DO; poor and only able to identify first gastric image.   GIVENS CAPSULE STUDY N/A 04/15/2022   Procedure: GIVENS CAPSULE STUDY;  Surgeon: Cindie Carlin POUR, DO;  [facility];  Service: Endoscopy;  Laterality: N/A;    Current Outpatient Medications on File Prior to Visit  Medication  Sig Dispense Refill   Accu-Chek Softclix Lancets lancets Use to check blood sugar fasting, daily. 100 each 12   acetaminophen  (TYLENOL ) 325 MG tablet Take 650 mg by mouth every 6 (six) hours as needed for moderate pain.     albuterol  (VENTOLIN  HFA) 108 (90 Base) MCG/ACT inhaler INHALE 2 PUFFS INTO THE LUNGS EVERY 4 HOURS AS NEEDED FOR WHEEZING OR SHORTNESS OF BREATH 6.7 g 1   ascorbic acid  (VITAMIN C) 500 MG tablet Take 1 tablet (500 mg total) by mouth daily. 30 tablet 1   atorvastatin  (LIPITOR) 10 MG tablet TAKE 1 TABLET(10 MG) BY MOUTH DAILY 90 tablet 3   Blood Glucose Monitoring Suppl (ONE TOUCH ULTRA 2) w/Device KIT 1 each by Does not apply route 3 (three) times daily. 1 kit 1   cetirizine  (ZYRTEC ) 10 MG tablet TAKE 1 TABLET BY MOUTH DAILY 90 tablet 0   Cholecalciferol (VITAMIN D ) 50 MCG (2000 UT) tablet Take 2,000 Units by mouth daily.     ciclopirox  (PENLAC ) 8 % solution Apply topically at bedtime. Apply over nail and surrounding skin. Apply daily over previous coat. After seven (7) days, may remove with alcohol and continue cycle. 6.6 mL 5   clotrimazole -betamethasone  (LOTRISONE ) cream APPLY TOPICALLY TO THE AFFECTED AREA TWICE DAILY (Patient taking differently: Apply 1 Application topically 2 (two) times daily as needed (irritation).) 45 g 1   dexlansoprazole  (DEXILANT ) 60 MG capsule TAKE 1 CAPSULE(60 MG) BY MOUTH DAILY 30 capsule 3   ferrous sulfate  (FEROSUL) 325 (65 FE) MG tablet TAKE 1 TABLET BY MOUTH EVERY MORNING 90 tablet 1   fluticasone  (FLONASE ) 50 MCG/ACT nasal spray Place 2 sprays into both nostrils daily as needed for allergies. 16 g 3   glucose blood test strip Use to check fasting blood sugar, daily. 100 each 12   hydrocortisone  (ANUSOL -HC) 25 MG suppository Place 1 suppository (25 mg total) rectally 2 (two) times daily. 30 suppository 1   Lancets MISC Check fbs daily 100 each 11   metoprolol  succinate (TOPROL -XL) 100 MG 24 hr tablet TAKE 1 TABLET(100 MG) BY MOUTH DAILY 30  tablet 6   Multiple Vitamin (MULTIVITAMIN) tablet Take 1 tablet by mouth daily.     ofloxacin  (OCUFLOX ) 0.3 % ophthalmic solution Place 1 drop into eye(s) 4 (four) times daily as needed for eye infection. 5 mL 0   Oxycodone  HCl 10 MG TABS Take 1-2 tablets (10-20 mg total) by mouth 3 (three) times daily as needed. 180 tablet 0   Oxycodone  HCl 10 MG TABS Take 1-2 tablets (10-20 mg total) by mouth 3 (three) times daily as needed. 180 tablet 0   potassium chloride  SA (KLOR-CON  M) 20 MEQ tablet TAKE 2 TABLETS BY MOUTH DAILY 30 tablet 0   prazosin  (MINIPRESS ) 2 MG capsule Take 1 capsule (2 mg total) by  mouth at bedtime. 30 capsule 5   sodium chloride  (OCEAN) 0.65 % SOLN nasal spray Place 1 spray into both nostrils as needed for congestion. 88 mL 0   sucralfate  (CARAFATE ) 1 g tablet TAKE 1 TABLET WITH MEALS AND AT BEDTIME MAX OF 4/DAY (Patient taking differently: Take 1 g by mouth 3 (three) times daily as needed.) 360 tablet 0   tirzepatide  (MOUNJARO ) 15 MG/0.5ML Pen Inject 15 mg into the skin once a week. 6 mL 3   venlafaxine  XR (EFFEXOR  XR) 75 MG 24 hr capsule Take 3 capsules (225 mg total) by mouth daily with breakfast. 270 capsule 3   zolpidem  (AMBIEN ) 10 MG tablet Take 1 tablet (10 mg total) by mouth at bedtime as needed for sleep. 30 tablet 2   dicyclomine  (BENTYL ) 10 MG capsule Take 1 capsule (10 mg total) by mouth every 6 (six) hours as needed for spasms. Hold if constipation (Patient not taking: Reported on 02/25/2023) 90 capsule 0   zolpidem  (AMBIEN ) 10 MG tablet Take 1 tablet (10 mg total) by mouth at bedtime as needed for sleep. 30 tablet 0   No current facility-administered medications on file prior to visit.   No Known Allergies Social History   Socioeconomic History   Marital status: Married    Spouse name: Not on file   Number of children: Not on file   Years of education: Not on file   Highest education level: Not on file  Occupational History   Occupation: stay at home spouse   Tobacco Use   Smoking status: Never   Smokeless tobacco: Never  Vaping Use   Vaping status: Never Used  Substance and Sexual Activity   Alcohol use: Not Currently    Comment: occasional   Drug use: Not Currently    Types: Marijuana    Comment: occasionally   Sexual activity: Yes  Other Topics Concern   Not on file  Social History Narrative   Lives at home with wife and two children.  Four children in all.     Social Drivers of Corporate Investment Banker Strain: Not on file  Food Insecurity: No Food Insecurity (04/02/2022)   Hunger Vital Sign    Worried About Running Out of Food in the Last Year: Never true    Ran Out of Food in the Last Year: Never true  Transportation Needs: No Transportation Needs (04/02/2022)   PRAPARE - Administrator, Civil Service (Medical): No    Lack of Transportation (Non-Medical): No  Physical Activity: Not on file  Stress: Not on file  Social Connections: Not on file  Intimate Partner Violence: Not At Risk (04/02/2022)   Humiliation, Afraid, Rape, and Kick questionnaire    Fear of Current or Ex-Partner: No    Emotionally Abused: No    Physically Abused: No    Sexually Abused: No    Review of Systems  Skin:  Positive for rash.  All other systems reviewed and are negative.      Objective:   Physical Exam Vitals reviewed.  Constitutional:      Appearance: He is obese.  Cardiovascular:     Rate and Rhythm: Normal rate and regular rhythm.     Heart sounds: Normal heart sounds.  Pulmonary:     Effort: Pulmonary effort is normal. No respiratory distress.     Breath sounds: Normal breath sounds. No stridor. No wheezing, rhonchi or rales.  Musculoskeletal:        General: Deformity present.  Right lower leg: Edema present.     Left lower leg: Edema present.  Neurological:     Mental Status: He is alert.         Assessment & Plan:   Type 2 diabetes mellitus with hyperglycemia, without long-term current use of insulin   (HCC) - Plan: Hemoglobin A1c, CBC with Differential/Platelet, COMPLETE METABOLIC PANEL WITH GFR  Fatigue, unspecified type - Plan: Testosterone  Total,Free,Bio, Males, Hemoglobin A1c, CBC with Differential/Platelet, COMPLETE METABOLIC PANEL WITH GFR, Celiac Disease Panel  Diarrhea, unspecified type - Plan: Celiac panel 10, Celiac Disease Panel  Anemia, unspecified type  GAD (generalized anxiety disorder)  Patient has tried and failed Wellbutrin  in addition to venlafaxine .  We discussed using hydroxyzine for breakthrough anxiety versus taking BuSpar  versus taking Rexulti for resistant depression.  Patient would like to try BuSpar  7.5 mg twice daily for anxiety.  Meanwhile I will check his lab work including a CBC a CMP and an A1c.  I would like to see his hemoglobin A1c less than 6.5.  Given his fatigue and poor libido, I will check a testosterone  level.  Given his GI upset with certain foods as well as his persistent iron deficiency anemia, I will screen for celiac disease.  Reassess in 1 month

## 2023-02-26 ENCOUNTER — Encounter: Payer: Self-pay | Admitting: Hematology

## 2023-02-26 ENCOUNTER — Other Ambulatory Visit (HOSPITAL_COMMUNITY): Payer: Self-pay

## 2023-02-26 LAB — COMPLETE METABOLIC PANEL WITH GFR
AG Ratio: 1.4 (calc) (ref 1.0–2.5)
ALT: 16 U/L (ref 9–46)
AST: 13 U/L (ref 10–40)
Albumin: 4.1 g/dL (ref 3.6–5.1)
Alkaline phosphatase (APISO): 75 U/L (ref 36–130)
BUN: 16 mg/dL (ref 7–25)
CO2: 29 mmol/L (ref 20–32)
Calcium: 9.7 mg/dL (ref 8.6–10.3)
Chloride: 105 mmol/L (ref 98–110)
Creat: 1.1 mg/dL (ref 0.60–1.29)
Globulin: 3 g/dL (ref 1.9–3.7)
Glucose, Bld: 91 mg/dL (ref 65–99)
Potassium: 4.7 mmol/L (ref 3.5–5.3)
Sodium: 141 mmol/L (ref 135–146)
Total Bilirubin: 0.2 mg/dL (ref 0.2–1.2)
Total Protein: 7.1 g/dL (ref 6.1–8.1)
eGFR: 83 mL/min/{1.73_m2} (ref >=60)

## 2023-02-26 LAB — CBC WITH DIFFERENTIAL/PLATELET
Absolute Lymphocytes: 3158 {cells}/uL (ref 850–3900)
Absolute Monocytes: 714 {cells}/uL (ref 200–950)
Basophils Absolute: 67 {cells}/uL (ref 0–200)
Basophils Relative: 0.8 %
Eosinophils Absolute: 218 {cells}/uL (ref 15–500)
Eosinophils Relative: 2.6 %
HCT: 34.2 % — ABNORMAL LOW (ref 38.5–50.0)
Hemoglobin: 9.9 g/dL — ABNORMAL LOW (ref 13.2–17.1)
MCH: 20.5 pg — ABNORMAL LOW (ref 27.0–33.0)
MCHC: 28.9 g/dL — ABNORMAL LOW (ref 32.0–36.0)
MCV: 70.7 fL — ABNORMAL LOW (ref 80.0–100.0)
MPV: 11.8 fL (ref 7.5–12.5)
Monocytes Relative: 8.5 %
Neutro Abs: 4242 {cells}/uL (ref 1500–7800)
Neutrophils Relative %: 50.5 %
Platelets: 275 10*3/uL (ref 140–400)
RBC: 4.84 10*6/uL (ref 4.20–5.80)
RDW: 14.8 % (ref 11.0–15.0)
Total Lymphocyte: 37.6 %
WBC: 8.4 10*3/uL (ref 3.8–10.8)

## 2023-02-26 LAB — HEMOGLOBIN A1C
Hgb A1c MFr Bld: 6 %{Hb} — ABNORMAL HIGH (ref <5.7)
Mean Plasma Glucose: 126 mg/dL
eAG (mmol/L): 7 mmol/L

## 2023-02-26 LAB — TESTOSTERONE TOTAL,FREE,BIO, MALES
Albumin: 4.1 g/dL (ref 3.6–5.1)
Sex Hormone Binding: 38 nmol/L (ref 10–50)
Testosterone: 29 ng/dL — ABNORMAL LOW (ref 250–827)

## 2023-02-27 ENCOUNTER — Other Ambulatory Visit: Payer: Medicare (Managed Care)

## 2023-02-27 ENCOUNTER — Encounter: Payer: Self-pay | Admitting: Hematology

## 2023-02-27 DIAGNOSIS — R7989 Other specified abnormal findings of blood chemistry: Secondary | ICD-10-CM

## 2023-02-27 DIAGNOSIS — E1169 Type 2 diabetes mellitus with other specified complication: Secondary | ICD-10-CM

## 2023-02-27 LAB — CELIAC DISEASE PANEL
(tTG) Ab, IgA: <1 U/mL
(tTG) Ab, IgG: <1 U/mL
Gliadin IgA: 2.6 U/mL
Gliadin IgG: <1 U/mL
Immunoglobulin A: 281 mg/dL (ref 47–310)

## 2023-02-27 NOTE — Addendum Note (Signed)
 Addended by: Gillermo Lack K on: 02/27/2023 10:51 AM   Modules accepted: Orders

## 2023-02-27 NOTE — Addendum Note (Signed)
 Addended by: Vallerie Gave on: 02/27/2023 09:32 AM   Modules accepted: Orders

## 2023-02-28 ENCOUNTER — Other Ambulatory Visit: Payer: Self-pay | Admitting: Family Medicine

## 2023-02-28 ENCOUNTER — Ambulatory Visit (HOSPITAL_COMMUNITY)
Admission: RE | Admit: 2023-02-28 | Discharge: 2023-02-28 | Disposition: A | Discharge status: Home / Self Care | Payer: Medicare (Managed Care) | Source: Ambulatory Visit | Attending: Family Medicine | Admitting: Family Medicine

## 2023-02-28 ENCOUNTER — Other Ambulatory Visit (HOSPITAL_COMMUNITY): Payer: Self-pay | Admitting: Family Medicine

## 2023-02-28 DIAGNOSIS — R7989 Other specified abnormal findings of blood chemistry: Secondary | ICD-10-CM

## 2023-02-28 DIAGNOSIS — R52 Pain, unspecified: Secondary | ICD-10-CM | POA: Diagnosis present

## 2023-02-28 LAB — LIPID PANEL
Cholesterol: 165 mg/dL (ref <200)
HDL: 44 mg/dL (ref >=40)
LDL Cholesterol (Calc): 96 mg/dL
Non-HDL Cholesterol (Calc): 121 mg/dL (ref <130)
Total CHOL/HDL Ratio: 3.8 (calc) (ref <5.0)
Triglycerides: 148 mg/dL (ref <150)

## 2023-02-28 LAB — FSH/LH
FSH: 20.1 m[IU]/mL — ABNORMAL HIGH (ref 1.4–12.8)
LH: 7.1 m[IU]/mL (ref 1.5–9.3)

## 2023-02-28 LAB — PROLACTIN: Prolactin: 21.6 ng/mL — ABNORMAL HIGH (ref 2.0–18.0)

## 2023-02-28 LAB — TESTOSTERONE: Testosterone: 41 ng/dL — ABNORMAL LOW (ref 250–827)

## 2023-03-06 ENCOUNTER — Other Ambulatory Visit: Payer: Self-pay | Admitting: Family Medicine

## 2023-03-10 ENCOUNTER — Encounter: Payer: Self-pay | Admitting: Family Medicine

## 2023-03-17 ENCOUNTER — Telehealth: Payer: Self-pay

## 2023-03-17 NOTE — Telephone Encounter (Signed)
 Documentation from [facility] on your desk regarding pt's Dexlansoprazole not beig on the pt's formulary. They gave him a temporary refill but the paperwork on your desk in the yellow folder

## 2023-03-18 ENCOUNTER — Encounter: Payer: Self-pay | Admitting: Family Medicine

## 2023-03-18 ENCOUNTER — Encounter: Payer: Self-pay | Admitting: Hematology

## 2023-03-18 ENCOUNTER — Ambulatory Visit (INDEPENDENT_AMBULATORY_CARE_PROVIDER_SITE_OTHER): Payer: Medicare (Managed Care) | Admitting: Family Medicine

## 2023-03-18 VITALS — BP 124/62 | HR 92 | Temp 98.5°F | Ht 68.0 in | Wt >= 6400 oz

## 2023-03-18 DIAGNOSIS — R509 Fever, unspecified: Secondary | ICD-10-CM

## 2023-03-18 DIAGNOSIS — R058 Other specified cough: Secondary | ICD-10-CM | POA: Diagnosis not present

## 2023-03-18 LAB — INFLUENZA A AND B AG, IMMUNOASSAY
INFLUENZA A ANTIGEN: NOT DETECTED
INFLUENZA B ANTIGEN: NOT DETECTED

## 2023-03-18 MED ORDER — OSELTAMIVIR PHOSPHATE 75 MG PO CAPS
75.0000 mg | ORAL_CAPSULE | Freq: Two times a day (BID) | ORAL | 0 refills | Status: DC
Start: 2023-03-18 — End: 2023-05-27

## 2023-03-18 NOTE — Progress Notes (Signed)
 Subjective:    Patient ID: Ricardo Lopez., male    DOB: 1975/03/03, 48 y.o.   MRN: [id number] Patient's grandchildren have the flu.  Patient reports a 2-day history of bodyaches, severe fatigue, fever, cough, head congestion, and rhinorrhea.  Flu test today in office is negative.  Fever has been 100.  He denies any purulent sputum.  He denies any pleurisy. Past Medical History:  Diagnosis Date   Anemia    Anxiety and depression    B12 deficiency    Back pain    COPD (chronic obstructive pulmonary disease) (HCC)    COVID    9/21- hypoxia requiring oxygen   Diabetes mellitus without complication (HCC)    Frequency of urination    GERD (gastroesophageal reflux disease)    Gluteal abscess    ecoli after covid hospitalization 2021   Headache    Hypercholesterolemia    Hypertension    Obesity    Oxygen dependent    o2@ 2L   Scar tissue    Scar Tissue on lungs due to Covid   Sleep apnea    Stomach ulcer    Tachycardia    Thoracic aortic aneurysm [facility])    Past Surgical History:  Procedure Laterality Date   BIOPSY  05/17/2021   Procedure: BIOPSY;  Surgeon: Rachael Fee, MD;  Location: [facility];  Service: Gastroenterology;;  EGD and COLON   BIOPSY  09/16/2022   Procedure: BIOPSY;  Surgeon: Lanelle Bal, DO;  [facility];  Service: Endoscopy;;   COLONOSCOPY WITH PROPOFOL N/A 05/17/2021   Surgeon: Rachael Fee, MD; mild erythema and granularity of the colonic mucosa with benign biopsies, internal hemorrhoids suspected to be etiology of rectal bleeding.   COLONOSCOPY WITH PROPOFOL N/A 09/16/2022   Procedure: COLONOSCOPY WITH PROPOFOL;  Surgeon: Lanelle Bal, DO;  [facility];  Service: Endoscopy;  Laterality: N/A;  10:15 am, asa 3   ESOPHAGOGASTRODUODENOSCOPY (EGD) WITH PROPOFOL N/A 05/17/2021   Surgeon: Rachael Fee, MD; very mild, nonspecific distal gastritis s/p biopsied, otherwise normal exam.  Gastric biopsy was benign, negative  for H. pylori.   ESOPHAGOGASTRODUODENOSCOPY (EGD) WITH PROPOFOL N/A 04/15/2022   Procedure: ESOPHAGOGASTRODUODENOSCOPY (EGD) WITH PROPOFOL;  Surgeon: Lanelle Bal, DO;  [facility];  Service: Endoscopy;  Laterality: N/A;   FLEXIBLE SIGMOIDOSCOPY N/A 04/15/2022   Procedure: FLEXIBLE SIGMOIDOSCOPY;  Surgeon: Lanelle Bal, DO;  [facility];  Service: Endoscopy;  Laterality: N/A;  100pm, asa 3   GIVENS CAPSULE STUDY N/A 11/16/2021   Surgeon: Lanelle Bal, DO; poor and only able to identify first gastric image.   GIVENS CAPSULE STUDY N/A 04/15/2022   Procedure: GIVENS CAPSULE STUDY;  Surgeon: Lanelle Bal, DO;  [facility];  Service: Endoscopy;  Laterality: N/A;    Current Outpatient Medications on File Prior to Visit  Medication Sig Dispense Refill   Accu-Chek Softclix Lancets lancets Use to check blood sugar fasting, daily. 100 each 12   acetaminophen (TYLENOL) 325 MG tablet Take 650 mg by mouth every 6 (six) hours as needed for moderate pain.     albuterol (VENTOLIN HFA) 108 (90 Base) MCG/ACT inhaler INHALE 2 PUFFS INTO THE LUNGS EVERY 4 HOURS AS NEEDED FOR WHEEZING OR SHORTNESS OF BREATH 6.7 g 1   ascorbic acid (VITAMIN C) 500 MG tablet Take 1 tablet (500 mg total) by mouth daily. 30 tablet 1   atorvastatin (LIPITOR) 10 MG tablet TAKE 1  TABLET(10 MG) BY MOUTH DAILY 90 tablet 3   Blood Glucose Monitoring Suppl (ONE TOUCH ULTRA 2) w/Device KIT 1 each by Does not apply route 3 (three) times daily. 1 kit 1   busPIRone (BUSPAR) 7.5 MG tablet Take 1 tablet (7.5 mg total) by mouth 2 (two) times daily. 60 tablet 3   cetirizine (ZYRTEC) 10 MG tablet Take 1 tablet (10 mg total) by mouth daily. 90 tablet 0   Cholecalciferol (VITAMIN D) 50 MCG (2000 UT) tablet Take 2,000 Units by mouth daily.     ciclopirox (PENLAC) 8 % solution Apply topically at bedtime. Apply over nail and surrounding skin. Apply daily over previous coat. After seven (7) days, may  remove with alcohol and continue cycle. 6.6 mL 5   clotrimazole-betamethasone (LOTRISONE) cream APPLY TOPICALLY TO THE AFFECTED AREA TWICE DAILY (Patient taking differently: Apply 1 Application topically 2 (two) times daily as needed (irritation).) 45 g 1   dexlansoprazole (DEXILANT) 60 MG capsule TAKE 1 CAPSULE(60 MG) BY MOUTH DAILY 30 capsule 3   dicyclomine (BENTYL) 10 MG capsule Take 1 capsule (10 mg total) by mouth every 6 (six) hours as needed for spasms. Hold if constipation 90 capsule 0   famotidine (PEPCID) 40 MG tablet Take 1 tablet (40 mg total) by mouth daily. 30 tablet 11   ferrous sulfate (FEROSUL) 325 (65 FE) MG tablet TAKE 1 TABLET BY MOUTH EVERY MORNING 90 tablet 1   fluticasone (FLONASE) 50 MCG/ACT nasal spray Place 2 sprays into both nostrils daily as needed for allergies. 16 g 3   glucose blood test strip Use to check fasting blood sugar, daily. 100 each 12   hydrocortisone (ANUSOL-HC) 25 MG suppository Place 1 suppository (25 mg total) rectally 2 (two) times daily. 30 suppository 1   Lancets MISC Check fbs daily 100 each 11   metoprolol succinate (TOPROL-XL) 100 MG 24 hr tablet TAKE 1 TABLET(100 MG) BY MOUTH DAILY 30 tablet 6   Multiple Vitamin (MULTIVITAMIN) tablet Take 1 tablet by mouth daily.     ofloxacin (OCUFLOX) 0.3 % ophthalmic solution Place 1 drop into eye(s) 4 (four) times daily as needed for eye infection. 5 mL 0   Oxycodone HCl 10 MG TABS Take 1-2 tablets (10-20 mg total) by mouth 3 (three) times daily as needed. 180 tablet 0   Oxycodone HCl 10 MG TABS Take 1-2 tablets (10-20 mg total) by mouth 3 (three) times daily as needed. 180 tablet 0   Oxycodone HCl 10 MG TABS Take 1-2 tablets (10-20 mg total) by mouth 3 (three) times daily as needed. 180 tablet 0   potassium chloride SA (KLOR-CON M) 20 MEQ tablet TAKE 2 TABLETS BY MOUTH DAILY 30 tablet 0   prazosin (MINIPRESS) 2 MG capsule Take 1 capsule (2 mg total) by mouth at bedtime. 30 capsule 5   sodium chloride  (OCEAN) 0.65 % SOLN nasal spray Place 1 spray into both nostrils as needed for congestion. 88 mL 0   sucralfate (CARAFATE) 1 g tablet TAKE 1 TABLET WITH MEALS AND AT BEDTIME MAX OF 4/DAY (Patient taking differently: Take 1 g by mouth 3 (three) times daily as needed.) 360 tablet 0   tirzepatide (MOUNJARO) 15 MG/0.5ML Pen Inject 15 mg into the skin once a week. 6 mL 3   venlafaxine XR (EFFEXOR XR) 75 MG 24 hr capsule Take 3 capsules (225 mg total) by mouth daily with breakfast. 270 capsule 3   zolpidem (AMBIEN) 10 MG tablet TAKE 1 TABLET(10 MG) BY MOUTH  AT BEDTIME AS NEEDED FOR SLEEP 30 tablet 3   zolpidem (AMBIEN) 10 MG tablet Take 1 tablet (10 mg total) by mouth at bedtime as needed for sleep. 30 tablet 0   No current facility-administered medications on file prior to visit.   No Known Allergies Social History   Socioeconomic History   Marital status: Married    Spouse name: Not on file   Number of children: Not on file   Years of education: Not on file   Highest education level: Not on file  Occupational History   Occupation: stay at home spouse  Tobacco Use   Smoking status: Never   Smokeless tobacco: Never  Vaping Use   Vaping status: Never Used  Substance and Sexual Activity   Alcohol use: Not Currently    Comment: occasional   Drug use: Not Currently    Types: Marijuana    Comment: occasionally   Sexual activity: Yes  Other Topics Concern   Not on file  Social History Narrative   Lives at home with wife and two children.  Four children in all.     Social Drivers of Corporate investment banker Strain: Not on file  Food Insecurity: No Food Insecurity (04/02/2022)   Hunger Vital Sign    Worried About Running Out of Food in the Last Year: Never true    Ran Out of Food in the Last Year: Never true  Transportation Needs: No Transportation Needs (04/02/2022)   PRAPARE - Administrator, Civil Service (Medical): No    Lack of Transportation (Non-Medical): No   Physical Activity: Not on file  Stress: Not on file  Social Connections: Not on file  Intimate Partner Violence: Not At Risk (04/02/2022)   Humiliation, Afraid, Rape, and Kick questionnaire    Fear of Current or Ex-Partner: No    Emotionally Abused: No    Physically Abused: No    Sexually Abused: No    Review of Systems  Skin:  Positive for rash.  All other systems reviewed and are negative.      Objective:   Physical Exam Vitals reviewed.  Constitutional:      Appearance: He is obese. He is not ill-appearing or toxic-appearing.  HENT:     Right Ear: Tympanic membrane and ear canal normal.     Left Ear: Tympanic membrane and ear canal normal.     Mouth/Throat:     Pharynx: Oropharynx is clear. No oropharyngeal exudate or posterior oropharyngeal erythema.  Cardiovascular:     Rate and Rhythm: Normal rate and regular rhythm.     Heart sounds: Normal heart sounds.  Pulmonary:     Effort: Pulmonary effort is normal. No respiratory distress.     Breath sounds: Normal breath sounds. No stridor. No wheezing, rhonchi or rales.  Neurological:     Mental Status: He is alert.         Assessment & Plan:   Fever, unspecified fever cause - Plan: Influenza A and B Ag, Immunoassay Based on his exposure, I am concerned that the patient has a flu.  Based on his medical comorbidities I will treat the patient aggressively with Tamiflu 75 twice daily for 5 days.  Recommended Tylenol as needed for fever.  At the present time there is no evidence of any secondary pneumonia.

## 2023-03-25 ENCOUNTER — Other Ambulatory Visit (HOSPITAL_COMMUNITY): Payer: Self-pay

## 2023-03-25 MED ORDER — OXYCODONE HCL 10 MG PO TABS
10.0000 mg | ORAL_TABLET | Freq: Three times a day (TID) | ORAL | 0 refills | Status: DC | PRN
  Filled 2023-03-25 – 2023-03-28 (×3): qty 180, 30d supply, fill #0

## 2023-03-26 ENCOUNTER — Other Ambulatory Visit (HOSPITAL_COMMUNITY): Payer: Self-pay

## 2023-03-26 ENCOUNTER — Encounter: Payer: Self-pay | Admitting: Family Medicine

## 2023-03-26 MED ORDER — OMEPRAZOLE 40 MG PO CPDR: 40.0000 mg | DELAYED_RELEASE_CAPSULE | Freq: Every day | ORAL | 3 refills | Status: AC

## 2023-03-26 NOTE — Addendum Note (Signed)
 Addended by: [name] on: 03/26/2023 12:02 PM   Modules accepted: Orders

## 2023-03-26 NOTE — Telephone Encounter (Signed)
 He has previously taken pantoprazole. I do not see wher ehe has tried omeprazole or esomeprazole.   I have sent in omeprazole 40 mg daily to take once Dexilant runs out. Take this in place of Dexilant.   Needs routine follow-up in about 3 months after starting this.

## 2023-03-27 NOTE — Telephone Encounter (Signed)
 Phoned and advised the pt of medication change and why / instructions / to schedule a follow up appt after taking the Omeprazole for 3 months. Pt expressed understanding

## 2023-03-28 ENCOUNTER — Other Ambulatory Visit: Payer: Self-pay

## 2023-03-28 ENCOUNTER — Other Ambulatory Visit (HOSPITAL_COMMUNITY): Payer: Self-pay

## 2023-03-30 ENCOUNTER — Ambulatory Visit
Admission: RE | Admit: 2023-03-30 | Discharge: 2023-03-30 | Disposition: A | Discharge status: Home / Self Care | Payer: Medicare (Managed Care) | Source: Ambulatory Visit | Attending: Family Medicine | Admitting: Family Medicine

## 2023-03-30 DIAGNOSIS — R7989 Other specified abnormal findings of blood chemistry: Secondary | ICD-10-CM

## 2023-03-30 MED ORDER — GADOPICLENOL 0.5 MMOL/ML IV SOLN
10.0000 mL | Freq: Once | INTRAVENOUS | Status: AC | PRN
  Administered 2023-03-30: 10 mL via INTRAVENOUS

## 2023-04-01 ENCOUNTER — Encounter: Payer: Self-pay | Admitting: Family Medicine

## 2023-04-01 ENCOUNTER — Ambulatory Visit (INDEPENDENT_AMBULATORY_CARE_PROVIDER_SITE_OTHER): Payer: Medicare (Managed Care) | Admitting: Family Medicine

## 2023-04-01 VITALS — BP 132/82 | HR 97 | Ht 68.0 in | Wt >= 6400 oz

## 2023-04-01 DIAGNOSIS — M25511 Pain in right shoulder: Secondary | ICD-10-CM

## 2023-04-01 NOTE — Progress Notes (Signed)
 Subjective:    Patient ID: Ricardo Park., male    DOB: 1975-06-25, 48 y.o.   MRN: [id number] Patient reports several weeks of pain in his right shoulder.  He has pain with abduction greater than 90 degrees.  It hurts to sleep on his shoulder at night.  Hurts for him to carry his grandchild in his right arm.  He has pain with internal rotation. Past Medical History:  Diagnosis Date   Anemia    Anxiety and depression    B12 deficiency    Back pain    COPD (chronic obstructive pulmonary disease) (HCC)    COVID    9/21- hypoxia requiring oxygen   Diabetes mellitus without complication (HCC)    Frequency of urination    GERD (gastroesophageal reflux disease)    Gluteal abscess    ecoli after covid hospitalization 2021   Headache    Hypercholesterolemia    Hypertension    Obesity    Oxygen dependent    o2@ 2L   Scar tissue    Scar Tissue on lungs due to Covid   Sleep apnea    Stomach ulcer    Tachycardia    Thoracic aortic aneurysm [facility])    Past Surgical History:  Procedure Laterality Date   BIOPSY  05/17/2021   Procedure: BIOPSY;  Surgeon: Rachael Fee, MD;  Location: [facility];  Service: Gastroenterology;;  EGD and COLON   BIOPSY  09/16/2022   Procedure: BIOPSY;  Surgeon: Lanelle Bal, DO;  Location: [facility];  Service: Endoscopy;;   COLONOSCOPY WITH PROPOFOL N/A 05/17/2021   Surgeon: Rachael Fee, MD; mild erythema and granularity of the colonic mucosa with benign biopsies, internal hemorrhoids suspected to be etiology of rectal bleeding.   COLONOSCOPY WITH PROPOFOL N/A 09/16/2022   Procedure: COLONOSCOPY WITH PROPOFOL;  Surgeon: Lanelle Bal, DO;  Location: [facility];  Service: Endoscopy;  Laterality: N/A;  10:15 am, asa 3   ESOPHAGOGASTRODUODENOSCOPY (EGD) WITH PROPOFOL N/A 05/17/2021   Surgeon: Rachael Fee, MD; very mild, nonspecific distal gastritis s/p biopsied, otherwise normal exam.  Gastric biopsy was benign, negative for H.  pylori.   ESOPHAGOGASTRODUODENOSCOPY (EGD) WITH PROPOFOL N/A 04/15/2022   Procedure: ESOPHAGOGASTRODUODENOSCOPY (EGD) WITH PROPOFOL;  Surgeon: Lanelle Bal, DO;  Location: [facility];  Service: Endoscopy;  Laterality: N/A;   FLEXIBLE SIGMOIDOSCOPY N/A 04/15/2022   Procedure: FLEXIBLE SIGMOIDOSCOPY;  Surgeon: Lanelle Bal, DO;  Location: [facility];  Service: Endoscopy;  Laterality: N/A;  100pm, asa 3   GIVENS CAPSULE STUDY N/A 11/16/2021   Surgeon: Lanelle Bal, DO; poor and only able to identify first gastric image.   GIVENS CAPSULE STUDY N/A 04/15/2022   Procedure: GIVENS CAPSULE STUDY;  Surgeon: Lanelle Bal, DO;  Location: [facility];  Service: Endoscopy;  Laterality: N/A;    Current Outpatient Medications on File Prior to Visit  Medication Sig Dispense Refill   Accu-Chek Softclix Lancets lancets Use to check blood sugar fasting, daily. 100 each 12   acetaminophen (TYLENOL) 325 MG tablet Take 650 mg by mouth every 6 (six) hours as needed for moderate pain.     albuterol (VENTOLIN HFA) 108 (90 Base) MCG/ACT inhaler INHALE 2 PUFFS INTO THE LUNGS EVERY 4 HOURS AS NEEDED FOR WHEEZING OR SHORTNESS OF BREATH 6.7 g 1   ascorbic acid (VITAMIN C) 500 MG tablet Take 1 tablet (500 mg total) by mouth daily. 30 tablet 1   atorvastatin (LIPITOR) 10  MG tablet TAKE 1 TABLET(10 MG) BY MOUTH DAILY 90 tablet 3   Blood Glucose Monitoring Suppl (ONE TOUCH ULTRA 2) w/Device KIT 1 each by Does not apply route 3 (three) times daily. 1 kit 1   busPIRone (BUSPAR) 7.5 MG tablet Take 1 tablet (7.5 mg total) by mouth 2 (two) times daily. 60 tablet 3   cetirizine (ZYRTEC) 10 MG tablet Take 1 tablet (10 mg total) by mouth daily. 90 tablet 0   Cholecalciferol (VITAMIN D) 50 MCG (2000 UT) tablet Take 2,000 Units by mouth daily.     ciclopirox (PENLAC) 8 % solution Apply topically at bedtime. Apply over nail and surrounding skin. Apply daily over previous coat. After seven (7) days, may remove with  alcohol and continue cycle. 6.6 mL 5   clotrimazole-betamethasone (LOTRISONE) cream APPLY TOPICALLY TO THE AFFECTED AREA TWICE DAILY (Patient taking differently: Apply 1 Application topically 2 (two) times daily as needed (irritation).) 45 g 1   dexlansoprazole (DEXILANT) 60 MG capsule TAKE 1 CAPSULE(60 MG) BY MOUTH DAILY 30 capsule 3   dicyclomine (BENTYL) 10 MG capsule Take 1 capsule (10 mg total) by mouth every 6 (six) hours as needed for spasms. Hold if constipation 90 capsule 0   famotidine (PEPCID) 40 MG tablet Take 1 tablet (40 mg total) by mouth daily. 30 tablet 11   ferrous sulfate (FEROSUL) 325 (65 FE) MG tablet TAKE 1 TABLET BY MOUTH EVERY MORNING 90 tablet 1   fluticasone (FLONASE) 50 MCG/ACT nasal spray Place 2 sprays into both nostrils daily as needed for allergies. 16 g 3   glucose blood test strip Use to check fasting blood sugar, daily. 100 each 12   hydrocortisone (ANUSOL-HC) 25 MG suppository Place 1 suppository (25 mg total) rectally 2 (two) times daily. 30 suppository 1   Lancets MISC Check fbs daily 100 each 11   metoprolol succinate (TOPROL-XL) 100 MG 24 hr tablet TAKE 1 TABLET(100 MG) BY MOUTH DAILY 30 tablet 6   Multiple Vitamin (MULTIVITAMIN) tablet Take 1 tablet by mouth daily.     ofloxacin (OCUFLOX) 0.3 % ophthalmic solution Place 1 drop into eye(s) 4 (four) times daily as needed for eye infection. 5 mL 0   omeprazole (PRILOSEC) 40 MG capsule Take 1 capsule (40 mg total) by mouth daily. 30 minutes before breakfast 90 capsule 3   oseltamivir (TAMIFLU) 75 MG capsule Take 1 capsule (75 mg total) by mouth 2 (two) times daily. 10 capsule 0   Oxycodone HCl 10 MG TABS Take 1-2 tablets (10-20 mg total) by mouth 3 (three) times daily as needed. 180 tablet 0   Oxycodone HCl 10 MG TABS Take 1-2 tablets (10-20 mg total) by mouth 3 (three) times daily as needed. 180 tablet 0   Oxycodone HCl 10 MG TABS Take 1-2 tablets (10-20 mg total) by mouth 3 (three) times daily as needed. 180  tablet 0   potassium chloride SA (KLOR-CON M) 20 MEQ tablet TAKE 2 TABLETS BY MOUTH DAILY 30 tablet 0   prazosin (MINIPRESS) 2 MG capsule Take 1 capsule (2 mg total) by mouth at bedtime. 30 capsule 5   sodium chloride (OCEAN) 0.65 % SOLN nasal spray Place 1 spray into both nostrils as needed for congestion. 88 mL 0   sucralfate (CARAFATE) 1 g tablet TAKE 1 TABLET WITH MEALS AND AT BEDTIME MAX OF 4/DAY (Patient taking differently: Take 1 g by mouth 3 (three) times daily as needed.) 360 tablet 0   tirzepatide (MOUNJARO) 15 MG/0.5ML Pen Inject  15 mg into the skin once a week. 6 mL 3   venlafaxine XR (EFFEXOR XR) 75 MG 24 hr capsule Take 3 capsules (225 mg total) by mouth daily with breakfast. 270 capsule 3   zolpidem (AMBIEN) 10 MG tablet TAKE 1 TABLET(10 MG) BY MOUTH AT BEDTIME AS NEEDED FOR SLEEP 30 tablet 3   zolpidem (AMBIEN) 10 MG tablet Take 1 tablet (10 mg total) by mouth at bedtime as needed for sleep. 30 tablet 0   No current facility-administered medications on file prior to visit.   No Known Allergies Social History   Socioeconomic History   Marital status: Married    Spouse name: Not on file   Number of children: Not on file   Years of education: Not on file   Highest education level: Not on file  Occupational History   Occupation: stay at home spouse  Tobacco Use   Smoking status: Never   Smokeless tobacco: Never  Vaping Use   Vaping status: Never Used  Substance and Sexual Activity   Alcohol use: Not Currently    Comment: occasional   Drug use: Not Currently    Types: Marijuana    Comment: occasionally   Sexual activity: Yes  Other Topics Concern   Not on file  Social History Narrative   Lives at home with wife and two children.  Four children in all.     Social Drivers of Corporate investment banker Strain: Not on file  Food Insecurity: No Food Insecurity (04/02/2022)   Hunger Vital Sign    Worried About Running Out of Food in the Last Year: Never true    Ran  Out of Food in the Last Year: Never true  Transportation Needs: No Transportation Needs (04/02/2022)   PRAPARE - Administrator, Civil Service (Medical): No    Lack of Transportation (Non-Medical): No  Physical Activity: Not on file  Stress: Not on file  Social Connections: Not on file  Intimate Partner Violence: Not At Risk (04/02/2022)   Humiliation, Afraid, Rape, and Kick questionnaire    Fear of Current or Ex-Partner: No    Emotionally Abused: No    Physically Abused: No    Sexually Abused: No    Review of Systems  Skin:  Positive for rash.  All other systems reviewed and are negative.      Objective:   Physical Exam Vitals reviewed.  Constitutional:      Appearance: He is obese. He is not ill-appearing or toxic-appearing.  Cardiovascular:     Rate and Rhythm: Normal rate and regular rhythm.     Heart sounds: Normal heart sounds.  Pulmonary:     Effort: Pulmonary effort is normal. No respiratory distress.     Breath sounds: Normal breath sounds. No stridor. No wheezing, rhonchi or rales.  Musculoskeletal:     Right shoulder: Tenderness present. No bony tenderness or crepitus. Decreased range of motion.  Neurological:     Mental Status: He is alert.         Assessment & Plan:   Acute pain of right shoulder I suspect impingement syndrome.  Technique, I injected the right subacromial space with 2 cc lidocaine, 2 cc of Marcaine, and 2 cc of 40 mg/mL Kenalog.

## 2023-04-03 DIAGNOSIS — M25511 Pain in right shoulder: Secondary | ICD-10-CM

## 2023-04-03 MED ORDER — TRIAMCINOLONE ACETONIDE 40 MG/ML IJ SUSP
80.0000 mg | Freq: Once | INTRAMUSCULAR | Status: AC
  Administered 2023-04-03: 80 mg via INTRA_ARTICULAR

## 2023-04-03 NOTE — Addendum Note (Signed)
 Addended by: Venia Carbon K on: 04/03/2023 08:51 AM   Modules accepted: Orders

## 2023-04-11 ENCOUNTER — Ambulatory Visit: Payer: Medicare (Managed Care) | Admitting: Family Medicine

## 2023-04-14 ENCOUNTER — Encounter: Payer: Self-pay | Admitting: Family Medicine

## 2023-04-17 ENCOUNTER — Other Ambulatory Visit: Payer: Self-pay

## 2023-04-17 ENCOUNTER — Other Ambulatory Visit (INDEPENDENT_AMBULATORY_CARE_PROVIDER_SITE_OTHER): Payer: Self-pay

## 2023-04-17 DIAGNOSIS — G4733 Obstructive sleep apnea (adult) (pediatric): Secondary | ICD-10-CM

## 2023-04-17 DIAGNOSIS — D352 Benign neoplasm of pituitary gland: Secondary | ICD-10-CM

## 2023-04-17 DIAGNOSIS — R0902 Hypoxemia: Secondary | ICD-10-CM

## 2023-04-17 DIAGNOSIS — J9611 Chronic respiratory failure with hypoxia: Secondary | ICD-10-CM

## 2023-04-22 ENCOUNTER — Ambulatory Visit: Payer: Medicare (Managed Care)

## 2023-04-22 ENCOUNTER — Other Ambulatory Visit (HOSPITAL_COMMUNITY): Payer: Self-pay

## 2023-04-22 DIAGNOSIS — J9611 Chronic respiratory failure with hypoxia: Secondary | ICD-10-CM

## 2023-04-22 MED ORDER — NALOXONE HCL 4 MG/0.1ML NA LIQD
1.0000 | NASAL | 1 refills | Status: AC | PRN
  Filled 2023-04-22: qty 2, 30d supply, fill #0

## 2023-04-22 MED ORDER — OXYCODONE HCL 10 MG PO TABS
10.0000 mg | ORAL_TABLET | Freq: Three times a day (TID) | ORAL | 0 refills | Status: DC
  Filled 2023-04-28: qty 180, 30d supply, fill #0

## 2023-04-23 NOTE — Progress Notes (Signed)
 Pt here for O2 walking test. O2 without oxygen at rest 88%. O2 without oxygen after ambulation 86%. Forms filled out and faxed to [employer]. Mjp,lpn

## 2023-04-28 ENCOUNTER — Other Ambulatory Visit: Payer: Self-pay

## 2023-04-28 ENCOUNTER — Encounter: Payer: Self-pay | Admitting: Family Medicine

## 2023-04-28 ENCOUNTER — Other Ambulatory Visit (HOSPITAL_COMMUNITY): Payer: Self-pay

## 2023-05-26 ENCOUNTER — Other Ambulatory Visit: Payer: Self-pay | Admitting: Family Medicine

## 2023-05-27 ENCOUNTER — Other Ambulatory Visit (HOSPITAL_COMMUNITY): Payer: Self-pay

## 2023-05-27 ENCOUNTER — Encounter: Payer: Self-pay | Admitting: "Endocrinology

## 2023-05-27 ENCOUNTER — Ambulatory Visit: Payer: Medicare (Managed Care) | Admitting: "Endocrinology

## 2023-05-27 VITALS — BP 124/86 | HR 92 | Ht 68.0 in | Wt >= 6400 oz

## 2023-05-27 DIAGNOSIS — E291 Testicular hypofunction: Secondary | ICD-10-CM

## 2023-05-27 DIAGNOSIS — D352 Benign neoplasm of pituitary gland: Secondary | ICD-10-CM | POA: Insufficient documentation

## 2023-05-27 DIAGNOSIS — E221 Hyperprolactinemia: Secondary | ICD-10-CM | POA: Diagnosis not present

## 2023-05-27 MED ORDER — MOUNJARO 15 MG/0.5ML ~~LOC~~ SOAJ
15.0000 mg | SUBCUTANEOUS | 3 refills | Status: AC
  Filled 2023-05-27: qty 6, 84d supply, fill #0
  Filled 2023-08-22 – 2023-08-25 (×2): qty 6, 84d supply, fill #1
  Filled 2023-10-25: qty 6, 84d supply, fill #2
  Filled 2023-11-05: qty 6, 84d supply, fill #0
  Filled 2024-02-07: qty 6, 84d supply, fill #1

## 2023-05-27 MED ORDER — OXYCODONE HCL 10 MG PO TABS
10.0000 mg | ORAL_TABLET | Freq: Three times a day (TID) | ORAL | 0 refills | Status: DC | PRN
  Filled 2023-05-27: qty 180, 30d supply, fill #0

## 2023-05-27 MED ORDER — CABERGOLINE 0.5 MG PO TABS: 0.2500 mg | ORAL_TABLET | ORAL | 1 refills | Status: DC

## 2023-05-27 NOTE — Patient Instructions (Signed)

## 2023-05-27 NOTE — Telephone Encounter (Signed)
 Requested medications are due for refill today.  yes  Requested medications are on the active medications list.  yes  Last refill. 07/23/2022 6mL 3 rf  Future visit scheduled.   no  Notes to clinic.  Medication not assigned to a protocol. Please review for refill.    Requested Prescriptions  Pending Prescriptions Disp Refills   tirzepatide  (MOUNJARO ) 15 MG/0.5ML Pen 6 mL 3    Sig: Inject 15 mg into the skin once a week.     Off-Protocol Failed - 05/27/2023  4:46 PM      Failed - Medication not assigned to a protocol, review manually.      Passed - Valid encounter within last 12 months    Recent Outpatient Visits           1 month ago Acute pain of right shoulder   [facility] Austine Lefort, MD   2 months ago Fever, unspecified fever cause   [place] [facility] Austine Lefort, MD   3 months ago Type 2 diabetes mellitus with hyperglycemia, without long-term current use of insulin  [facility])   [place] [facility] Austine Lefort, MD   10 months ago Type 2 diabetes mellitus with hyperglycemia, without long-term current use of insulin  [facility])   [place] [facility] Austine Lefort, MD   1 year ago Type 2 diabetes mellitus with hyperglycemia, without long-term current use of insulin  [facility])   [place] [facility] Pickard, Cisco Crest, MD

## 2023-05-27 NOTE — Progress Notes (Signed)
 Endocrinology Consult Note                                            05/27/2023, 1:35 PM   Subjective:    Patient ID: Ricardo Lopez., male    DOB: [birth year], PCP Austine Lefort, MD   Past Medical History:  Diagnosis Date   Anemia    Anxiety and depression    B12 deficiency    Back pain    COPD (chronic obstructive pulmonary disease) (HCC)    COVID    9/21- hypoxia requiring oxygen   Diabetes mellitus without complication (HCC)    Frequency of urination    GERD (gastroesophageal reflux disease)    Gluteal abscess    ecoli after covid hospitalization 2021   Headache    Hypercholesterolemia    Hypertension    Obesity    Oxygen dependent    o2@ 2L   Scar tissue    Scar Tissue on lungs due to Covid   Sleep apnea    Stomach ulcer    Tachycardia    Thoracic aortic aneurysm [facility])    Past Surgical History:  Procedure Laterality Date   BIOPSY  05/17/2021   Procedure: BIOPSY;  Surgeon: Janel Medford, MD;  Location: [facility];  Service: Gastroenterology;;  EGD and COLON   BIOPSY  09/16/2022   Procedure: BIOPSY;  Surgeon: Vinetta Greening, DO;  [facility];  Service: Endoscopy;;   COLONOSCOPY WITH PROPOFOL  N/A 05/17/2021   Surgeon: Janel Medford, MD; mild erythema and granularity of the colonic mucosa with benign biopsies, internal hemorrhoids suspected to be etiology of rectal bleeding.   COLONOSCOPY WITH PROPOFOL  N/A 09/16/2022   Procedure: COLONOSCOPY WITH PROPOFOL ;  Surgeon: Vinetta Greening, DO;  [facility]: [facility];  Service: Endoscopy;  Laterality: N/A;  10:15 am, asa 3   ESOPHAGOGASTRODUODENOSCOPY (EGD) WITH PROPOFOL  N/A 05/17/2021   Surgeon: Janel Medford, MD; very mild, nonspecific distal gastritis s/p biopsied, otherwise normal exam.  Gastric biopsy was benign, negative for H. pylori.   ESOPHAGOGASTRODUODENOSCOPY (EGD) WITH PROPOFOL  N/A 04/15/2022   Procedure: ESOPHAGOGASTRODUODENOSCOPY (EGD) WITH PROPOFOL ;  Surgeon: Vinetta Greening, DO;  [facility];  Service: Endoscopy;  Laterality: N/A;   FLEXIBLE SIGMOIDOSCOPY N/A 04/15/2022   Procedure: FLEXIBLE SIGMOIDOSCOPY;  Surgeon: Vinetta Greening, DO;  [facility];  Service: Endoscopy;  Laterality: N/A;  100pm, asa 3   GIVENS CAPSULE STUDY N/A 11/16/2021   Surgeon: Vinetta Greening, DO; poor and only able to identify first gastric image.   GIVENS CAPSULE STUDY N/A 04/15/2022   Procedure: GIVENS CAPSULE STUDY;  Surgeon: Vinetta Greening, DO;  [facility];  Service: Endoscopy;  Laterality: N/A;   Social History   Socioeconomic History   Marital status: Married    Spouse name: Not on file   Number of children: Not on file   Years of education: Not on file   Highest education level: Not on file  Occupational History   Occupation: stay at home spouse  Tobacco Use   Smoking status: Never   Smokeless tobacco: Never  Vaping Use   Vaping status: Never Used  Substance and Sexual Activity   Alcohol use: Not Currently    Comment: occasional   Drug use: Not Currently    Types: Marijuana  Comment: occasionally   Sexual activity: Yes  Other Topics Concern   Not on file  Social History Narrative   Lives at home with wife and two children.  Four children in all.     Social Drivers of Corporate investment banker Strain: Not on file  Food Insecurity: No Food Insecurity (04/02/2022)   Hunger Vital Sign    Worried About Running Out of Food in the Last Year: Never true    Ran Out of Food in the Last Year: Never true  Transportation Needs: No Transportation Needs (04/02/2022)   PRAPARE - Administrator, Civil Service (Medical): No    Lack of Transportation (Non-Medical): No  Physical Activity: Not on file  Stress: Not on file  Social Connections: Not on file   Family History  Problem Relation Age of Onset   Diabetes Mother    Hypertension Mother    Hyperlipidemia Mother    Thyroid disease Mother    Diabetes  Father    Hyperlipidemia Father    Hypertension Father    Depression Father    Anxiety disorder Father    Sleep apnea Father    Colon cancer Neg Hx    Stomach cancer Neg Hx    Outpatient Encounter Medications as of 05/27/2023  Medication Sig   [START ON 05/29/2023] cabergoline (DOSTINEX) 0.5 MG tablet Take 0.5 tablets (0.25 mg total) by mouth 2 (two) times a week.   fluticasone  (FLONASE ) 50 MCG/ACT nasal spray Place 2 sprays into both nostrils daily as needed for allergies.   ofloxacin  (OCUFLOX ) 0.3 % ophthalmic solution Place 1 drop into eye(s) 4 (four) times daily as needed for eye infection.   Accu-Chek Softclix Lancets lancets Use to check blood sugar fasting, daily.   acetaminophen  (TYLENOL ) 325 MG tablet Take 650 mg by mouth every 6 (six) hours as needed for moderate pain.   albuterol  (VENTOLIN  HFA) 108 (90 Base) MCG/ACT inhaler INHALE 2 PUFFS INTO THE LUNGS EVERY 4 HOURS AS NEEDED FOR WHEEZING OR SHORTNESS OF BREATH   ascorbic acid  (VITAMIN C) 500 MG tablet Take 1 tablet (500 mg total) by mouth daily.   atorvastatin  (LIPITOR) 10 MG tablet TAKE 1 TABLET(10 MG) BY MOUTH DAILY   Blood Glucose Monitoring Suppl (ONE TOUCH ULTRA 2) w/Device KIT 1 each by Does not apply route 3 (three) times daily.   busPIRone  (BUSPAR ) 7.5 MG tablet Take 1 tablet (7.5 mg total) by mouth 2 (two) times daily.   cetirizine  (ZYRTEC ) 10 MG tablet Take 1 tablet (10 mg total) by mouth daily.   Cholecalciferol (VITAMIN D ) 50 MCG (2000 UT) tablet Take 2,000 Units by mouth daily.   ciclopirox  (PENLAC ) 8 % solution Apply topically at bedtime. Apply over nail and surrounding skin. Apply daily over previous coat. After seven (7) days, may remove with alcohol and continue cycle.   clotrimazole -betamethasone  (LOTRISONE ) cream APPLY TOPICALLY TO THE AFFECTED AREA TWICE DAILY (Patient taking differently: Apply 1 Application topically 2 (two) times daily as needed (irritation).)   famotidine  (PEPCID ) 40 MG tablet Take 1 tablet  (40 mg total) by mouth daily.   glucose blood test strip Use to check fasting blood sugar, daily.   hydrocortisone  (ANUSOL -HC) 25 MG suppository Place 1 suppository (25 mg total) rectally 2 (two) times daily.   Lancets MISC Check fbs daily   metoprolol  succinate (TOPROL -XL) 100 MG 24 hr tablet TAKE 1 TABLET(100 MG) BY MOUTH DAILY   Multiple Vitamin (MULTIVITAMIN) tablet Take 1 tablet by mouth daily.  naloxone  (NARCAN ) nasal spray 4 mg/0.1 mL Place 1 spray into the nose as needed IF FOUND UNRESPONSIVE AND CALL 911 IMMEDIATELY   omeprazole  (PRILOSEC) 40 MG capsule Take 1 capsule (40 mg total) by mouth daily. 30 minutes before breakfast   Oxycodone  HCl 10 MG TABS Take 1-2 tablets (10-20 mg total) by mouth 3 (three) times daily as needed.   Oxycodone  HCl 10 MG TABS Take 1-2 tablets (10-20 mg total) by mouth 3 (three) times daily as needed.   Oxycodone  HCl 10 MG TABS Take 1-2 tablets (10-20 mg total) by mouth 3 (three) times daily as needed.   Oxycodone  HCl 10 MG TABS Take 1-2 tablets (10-20 mg total) by mouth 3 (three) times daily as needed   Oxycodone  HCl 10 MG TABS Take 1-2 tablets (10-20 mg total) by mouth 3 (three) times daily as needed.   prazosin  (MINIPRESS ) 2 MG capsule Take 1 capsule (2 mg total) by mouth at bedtime.   sodium chloride  (OCEAN) 0.65 % SOLN nasal spray Place 1 spray into both nostrils as needed for congestion.   tirzepatide  (MOUNJARO ) 15 MG/0.5ML Pen Inject 15 mg into the skin once a week.   venlafaxine  XR (EFFEXOR  XR) 75 MG 24 hr capsule Take 3 capsules (225 mg total) by mouth daily with breakfast.   zolpidem  (AMBIEN ) 10 MG tablet Take 1 tablet (10 mg total) by mouth at bedtime as needed for sleep.   zolpidem  (AMBIEN ) 10 MG tablet TAKE 1 TABLET(10 MG) BY MOUTH AT BEDTIME AS NEEDED FOR SLEEP   [DISCONTINUED] dexlansoprazole  (DEXILANT ) 60 MG capsule TAKE 1 CAPSULE(60 MG) BY MOUTH DAILY (Patient not taking: Reported on 05/27/2023)   [DISCONTINUED] dicyclomine  (BENTYL ) 10 MG capsule  Take 1 capsule (10 mg total) by mouth every 6 (six) hours as needed for spasms. Hold if constipation (Patient not taking: Reported on 05/27/2023)   [DISCONTINUED] ferrous sulfate  (FEROSUL) 325 (65 FE) MG tablet TAKE 1 TABLET BY MOUTH EVERY MORNING (Patient not taking: Reported on 05/27/2023)   [DISCONTINUED] oseltamivir  (TAMIFLU ) 75 MG capsule Take 1 capsule (75 mg total) by mouth 2 (two) times daily. (Patient not taking: Reported on 05/27/2023)   [DISCONTINUED] potassium chloride  SA (KLOR-CON  M) 20 MEQ tablet TAKE 2 TABLETS BY MOUTH DAILY (Patient not taking: Reported on 05/27/2023)   [DISCONTINUED] sucralfate  (CARAFATE ) 1 g tablet TAKE 1 TABLET WITH MEALS AND AT BEDTIME MAX OF 4/DAY (Patient taking differently: Take 1 g by mouth 3 (three) times daily as needed.)   No facility-administered encounter medications on file as of 05/27/2023.   ALLERGIES: No Known Allergies  VACCINATION STATUS: Immunization History  Administered Date(s) Administered   Influenza,inj,Quad PF,6+ Mos 11/23/2019   Influenza-Unspecified 10/08/2021    HPI Kale Sibole. is 48 y.o. male who presents today with a medical history as above. he is being seen in consultation for  pituitary macroadenoma requested by Austine Lefort, MD.  He is accompanied by his wife to clinic.  His history is obtained from the couple as well as his chart review. Patient reports overweight/obesity for the majority of his adulthood.  He fathered 2 children.  He has always known about having only 1 testicle. Due to ongoing fatigue he underwent testosterone  measurement in February 2025 when he was found to have profound hypogonadism but total testosterone  of 29.  Subsequently, he was offered more endocrine workup which showed FSH of 20.1, elevated, prolactin of 21.6, elevated.  His testosterone  remains low at 41.  He underwent MRI of brain with and  without contrast on March 30, 2023 which showed the pituitary gland to be diffusely enlarged and mildly  heterogeneous measuring 1.3 x 2.0 x 1.4 cm. There was a convex superior margin with a gland extending into the suprasellar cistern without significant mass effect on the optic nerves or chiasm. Patient with medical history of CHF, sleep apnea, thoracic aortic aneurysm, as well as complicated COVID which rendered him on oxygen through nasal cannula continuously.  His current flow is 2 L/m. He does not have acute complaints today.  His other medical problems include type 2 diabetes for which he is on Mounjaro  15 mg weekly, hyperlipidemia on atorvastatin  10 mg p.o. nightly.  He also has medical history of hypertension on management with metoprolol  100 mg p.o. daily.  His other medications include oxycodone  10 mg 6 times a day, albuterol , buspirone , omeprazole , prazosin , cetirizine , famotidine , zolpidem . His recent labs show hyperlipidemia associated with thyroid macroadenoma. He denies galactorrhea, nor retro-orbital headache.  Review of Systems  Constitutional: +mildly fluctuating body weight,  no fatigue, no subjective hyperthermia, no subjective hypothermia, + on ongoing oxygen supplement through nasal cannula Eyes: no blurry vision, no xerophthalmia ENT: no sore throat, no nodules palpated in throat, no dysphagia/odynophagia, no hoarseness Cardiovascular: no Chest Pain, no Shortness of Breath, no palpitations, no leg swelling Respiratory: no cough, no shortness of breath Gastrointestinal: no Nausea/Vomiting/Diarhhea Musculoskeletal: no muscle/joint aches Skin: no rashes Neurological: no tremors, no numbness, no tingling, no dizziness Psychiatric: no depression, no anxiety  Objective:       05/27/2023    8:13 AM 04/01/2023    9:58 AM 03/18/2023    2:57 PM  Vitals with BMI  Height 5\' 8"  5\' 8"  5\' 8"   Weight 430 lbs 419 lbs 419 lbs  BMI 65.4 63.72 63.72  Systolic 124 132 161  Diastolic 86 82 62  Pulse 92 97 92    BP 124/86   Pulse 92   Ht 5\' 8"  (1.727 m)   Wt (!) 430 lb (195 kg)    BMI 65.38 kg/m   Wt Readings from Last 3 Encounters:  05/27/23 (!) 430 lb (195 kg)  04/01/23 (!) 419 lb (190.1 kg)  03/18/23 (!) 419 lb (190.1 kg)    Physical Exam  Constitutional:  Body mass index is 65.38 kg/m.,  not in acute distress, normal state of mind, + on portable oxygen canister. Eyes: PERRLA, EOMI, no exophthalmos ENT: moist mucous membranes, no gross thyromegaly, no gross cervical lymphadenopathy Cardiovascular: normal precordial activity, Regular Rate and Rhythm, no Murmur/Rubs/Gallops Respiratory:  adequate breathing efforts, no gross chest deformity, Clear to auscultation bilaterally Gastrointestinal: abdomen soft, Non -tender, No distension, Bowel Sounds present, no gross organomegaly Musculoskeletal: no gross deformities, strength intact in all four extremities, no peripheral edema Skin: moist, warm, no rashes Genital exam: He has soft-firm right testicle measuring 12 cc, absent left testicle.  No intrascrotal mass lesion, large thighs bilaterally. Neurological: no tremor with outstretched hands, Deep tendon reflexes normal in bilateral lower extremities.  CMP ( most recent) CMP     Component Value Date/Time   NA 141 02/25/2023 1216   NA 141 05/18/2020 1357   K 4.7 02/25/2023 1216   CL 105 02/25/2023 1216   CO2 29 02/25/2023 1216   GLUCOSE 91 02/25/2023 1216   BUN 16 02/25/2023 1216   BUN 13 05/18/2020 1357   CREATININE 1.10 02/25/2023 1216   CALCIUM  9.7 02/25/2023 1216   PROT 7.1 02/25/2023 1216   PROT 7.4 05/18/2020 1357   ALBUMIN  3.8 11/25/2022 1327   ALBUMIN 4.3 05/18/2020 1357   AST 13 02/25/2023 1216   ALT 16 02/25/2023 1216   ALKPHOS 76 11/25/2022 1327   BILITOT 0.2 02/25/2023 1216   BILITOT <0.2 05/18/2020 1357   GFR 84.91 03/21/2021 1158   EGFR 83 02/25/2023 1216   EGFR 98 05/18/2020 1357   GFRNONAA >60 11/25/2022 1327   GFRNONAA 95 12/31/2019 1446     Diabetic Labs (most recent): Lab Results  Component Value Date   HGBA1C 6.0 (H)  02/25/2023   HGBA1C 6.1 (H) 07/02/2022   HGBA1C 6.3 (H) 10/18/2021     Lipid Panel ( most recent) Lipid Panel     Component Value Date/Time   CHOL 165 02/27/2023 1052   CHOL 221 (H) 05/18/2020 1357   TRIG 148 02/27/2023 1052   HDL 44 02/27/2023 1052   HDL 53 05/18/2020 1357   CHOLHDL 3.8 02/27/2023 1052   LDLCALC 96 02/27/2023 1052   LABVLDL 23 05/18/2020 1357      Lab Results  Component Value Date   TSH 4.680 (H) 05/18/2020   TSH 5.270 (H) 01/28/2020           Assessment & Plan:   1. Pituitary macroadenoma (HCC) (Primary) 2. Hyperprolactinemia (HCC) 3. Hypogonadism, male   - Keith Starn.  is being seen at a kind request of Austine Lefort, MD. - I have reviewed his available endocrine records and clinically evaluated the patient. - Based on these reviews, he has pituitary macroadenoma, hyperprolactinemia, hypogonadism, morbid obesity among other major medical problems including CHF, sleep apnea, oxygen dependent respiratory failure. His hypogonadism is secondary and of multiple etiology including opioids, hyperprolactinemia, history macroadenoma. He is a candidate for a trial of dopamine receptor agonist to lower prolactin to normal range in hopes of improving his hypogonadism.  He is generally poor candidate for androgen replacement therapy given his severe sleep apnea.  I discussed and initiated cabergoline 0.25 mg p.o. twice a week with plan to repeat labs in 6 months. He will return with the following labs before his next visit: - Prolactin - Testosterone , Free, Total, SHBG - PSA - CBC with Differential/Platelet He will need a follow-up MRI in 1 to 2 years to assess treatment effect.  He will continue to benefit from GLP-1 receptor agonist, advised to continue Mounjaro  50 mg subcutaneously weekly. - he is advised to maintain close follow up with Austine Lefort, MD for primary care needs.   -Thank you for involving me in the care of this pleasant  patient.  Time spent with the patient: 45  minutes spent in  counseling him about pituitary macroadenoma, hyperprolactinemia, hypogonadism and the rest in obtaining information about his symptoms, reviewing his previous labs/studies (including abstractions from other facilities),  evaluations, and treatments,  and developing a plan to confirm diagnosis and long term treatment based on the latest standards of care/guidelines; and documenting his care.  Ricardo Lopez. participated in the discussions, expressed understanding, and voiced agreement with the above plans.  All questions were answered to his satisfaction. he is encouraged to contact clinic should he have any questions or concerns prior to his return visit.  Follow up plan: Return in about 4 months (around 09/27/2023) for Fasting Labs  in AM B4 8.   Kalvin Orf, MD [facility] [address] [place], [place] [address] Phone: [phone]  Fax: [phone]     05/27/2023, 1:35 PM  This note was  partially dictated with voice recognition software. Similar sounding words can be transcribed inadequately or may not  be corrected upon review.

## 2023-06-01 ENCOUNTER — Other Ambulatory Visit: Payer: Self-pay | Admitting: Family Medicine

## 2023-06-01 DIAGNOSIS — R0982 Postnasal drip: Secondary | ICD-10-CM

## 2023-06-02 ENCOUNTER — Inpatient Hospital Stay: Payer: Medicare (Managed Care) | Attending: Hematology

## 2023-06-02 ENCOUNTER — Encounter: Payer: Self-pay | Admitting: Family Medicine

## 2023-06-02 DIAGNOSIS — D563 Thalassemia minor: Secondary | ICD-10-CM

## 2023-06-02 DIAGNOSIS — D500 Iron deficiency anemia secondary to blood loss (chronic): Secondary | ICD-10-CM

## 2023-06-02 DIAGNOSIS — D509 Iron deficiency anemia, unspecified: Secondary | ICD-10-CM | POA: Diagnosis present

## 2023-06-02 LAB — CBC WITH DIFFERENTIAL/PLATELET
Abs Immature Granulocytes: 0.02 10*3/uL (ref 0.00–0.07)
Basophils Absolute: 0 10*3/uL (ref 0.0–0.1)
Basophils Relative: 0 %
Eosinophils Absolute: 0.1 10*3/uL (ref 0.0–0.5)
Eosinophils Relative: 1 %
HCT: 33.4 % — ABNORMAL LOW (ref 39.0–52.0)
Hemoglobin: 9.6 g/dL — ABNORMAL LOW (ref 13.0–17.0)
Immature Granulocytes: 0 %
Lymphocytes Relative: 30 %
Lymphs Abs: 2.9 10*3/uL (ref 0.7–4.0)
MCH: 20.6 pg — ABNORMAL LOW (ref 26.0–34.0)
MCHC: 28.7 g/dL — ABNORMAL LOW (ref 30.0–36.0)
MCV: 71.7 fL — ABNORMAL LOW (ref 80.0–100.0)
Monocytes Absolute: 0.8 10*3/uL (ref 0.1–1.0)
Monocytes Relative: 9 %
Neutro Abs: 5.8 10*3/uL (ref 1.7–7.7)
Neutrophils Relative %: 60 %
Platelets: 237 10*3/uL (ref 150–400)
RBC: 4.66 MIL/uL (ref 4.22–5.81)
RDW: 15 % (ref 11.5–15.5)
WBC: 9.7 10*3/uL (ref 4.0–10.5)
nRBC: 0 % (ref 0.0–0.2)

## 2023-06-02 LAB — COMPREHENSIVE METABOLIC PANEL WITH GFR
ALT: 16 U/L (ref 0–44)
AST: 18 U/L (ref 15–41)
Albumin: 3.5 g/dL (ref 3.5–5.0)
Alkaline Phosphatase: 69 U/L (ref 38–126)
Anion gap: 8 (ref 5–15)
BUN: 12 mg/dL (ref 6–20)
CO2: 30 mmol/L (ref 22–32)
Calcium: 9.3 mg/dL (ref 8.9–10.3)
Chloride: 104 mmol/L (ref 98–111)
Creatinine, Ser: 1.07 mg/dL (ref 0.61–1.24)
GFR, Estimated: >60 mL/min (ref >60)
Glucose, Bld: 111 mg/dL — ABNORMAL HIGH (ref 70–99)
Potassium: 4.1 mmol/L (ref 3.5–5.1)
Sodium: 142 mmol/L (ref 135–145)
Total Bilirubin: 0.3 mg/dL (ref 0.0–1.2)
Total Protein: 7.5 g/dL (ref 6.5–8.1)

## 2023-06-02 LAB — IRON AND TIBC
Iron: 47 ug/dL (ref 45–182)
Saturation Ratios: 17 % — ABNORMAL LOW (ref 17.9–39.5)
TIBC: 279 ug/dL (ref 250–450)
UIBC: 232 ug/dL

## 2023-06-02 LAB — FERRITIN: Ferritin: 161 ng/mL (ref 24–336)

## 2023-06-03 ENCOUNTER — Encounter: Payer: Self-pay | Admitting: Family Medicine

## 2023-06-03 ENCOUNTER — Other Ambulatory Visit: Payer: Self-pay | Admitting: Family Medicine

## 2023-06-03 ENCOUNTER — Ambulatory Visit (INDEPENDENT_AMBULATORY_CARE_PROVIDER_SITE_OTHER): Payer: Medicare (Managed Care) | Admitting: Family Medicine

## 2023-06-03 VITALS — BP 126/82 | HR 89 | Temp 98.5°F | Ht 68.0 in | Wt >= 6400 oz

## 2023-06-03 DIAGNOSIS — L0291 Cutaneous abscess, unspecified: Secondary | ICD-10-CM | POA: Diagnosis not present

## 2023-06-03 DIAGNOSIS — M67911 Unspecified disorder of synovium and tendon, right shoulder: Secondary | ICD-10-CM

## 2023-06-03 MED ORDER — SULFAMETHOXAZOLE-TRIMETHOPRIM 800-160 MG PO TABS: 1.0000 | ORAL_TABLET | Freq: Two times a day (BID) | ORAL | 0 refills | Status: DC

## 2023-06-03 NOTE — Telephone Encounter (Signed)
 Requested Prescriptions  Pending Prescriptions Disp Refills   cetirizine  (ZYRTEC ) 10 MG tablet [Pharmacy Med Name: CETIRIZINE  10MG  TABLETS] 90 tablet 0    Sig: TAKE 1 TABLET(10 MG) BY MOUTH DAILY     Ear, Nose, and Throat:  Antihistamines 2 Passed - 06/03/2023 12:31 PM      Passed - Cr in normal range and within 360 days    Creat  Date Value Ref Range Status  02/25/2023 1.10 0.60 - 1.29 mg/dL Final   Creatinine, Ser  Date Value Ref Range Status  06/02/2023 1.07 0.61 - 1.24 mg/dL Final         Passed - Valid encounter within last 12 months    Recent Outpatient Visits           Today Tendinopathy of rotator cuff, right   [place] [facility] Austine Lefort, MD   2 months ago Acute pain of right shoulder   [place] [facility] Austine Lefort, MD   2 months ago Fever, unspecified fever cause   [place] [facility] Austine Lefort, MD   3 months ago Type 2 diabetes mellitus with hyperglycemia, without long-term current use of insulin  [facility])   [place] [facility] Austine Lefort, MD   11 months ago Type 2 diabetes mellitus with hyperglycemia, without long-term current use of insulin  [facility])   [place] [facility] Pickard, Cisco Crest, MD

## 2023-06-03 NOTE — Progress Notes (Signed)
 Subjective:    Patient ID: Ricardo Lopez., male    DOB: 1975/07/30, 48 y.o.   MRN: [id number] 04/01/23 Patient reports several weeks of pain in his right shoulder.  He has pain with abduction greater than 90 degrees.  It hurts to sleep on his shoulder at night.  Hurts for him to carry his grandchild in his right arm.  He has pain with internal rotation.  At that time my plan was: I suspect impingement syndrome.  Technique, I injected the right subacromial space with 2 cc lidocaine , 2 cc of Marcaine, and 2 cc of 40 mg/mL Kenalog .  06/03/23 Patient continues to have pain in his right shoulder.  He continues to report pain when he sleeps at night.  He continues to report pain with lifting his arm above his head.  He saw no benefit from the cortisone injection shoulder.  He states he may have received 3 days of relief from the injection.  However the pain is back now and just a severe.  He also has an abscess on his right cheek.  The erythematous indurated area is approximately 3 cm in diameter.  There is a central pustule.  I am able to express white purulent material where the boil has spontaneously drained. Past Medical History:  Diagnosis Date   Anemia    Anxiety and depression    B12 deficiency    Back pain    COPD (chronic obstructive pulmonary disease) (HCC)    COVID    9/21- hypoxia requiring oxygen   Diabetes mellitus without complication (HCC)    Frequency of urination    GERD (gastroesophageal reflux disease)    Gluteal abscess    ecoli after covid hospitalization 2021   Headache    Hypercholesterolemia    Hypertension    Obesity    Oxygen dependent    o2@ 2L   Scar tissue    Scar Tissue on lungs due to Covid   Sleep apnea    Stomach ulcer    Tachycardia    Thoracic aortic aneurysm [facility])    Past Surgical History:  Procedure Laterality Date   BIOPSY  05/17/2021   Procedure: BIOPSY;  Surgeon: Janel Medford, MD;  Location: [facility];  Service: Gastroenterology;;   EGD and COLON   BIOPSY  09/16/2022   Procedure: BIOPSY;  Surgeon: Vinetta Greening, DO;  [facility];  Service: Endoscopy;;   COLONOSCOPY WITH PROPOFOL  N/A 05/17/2021   Surgeon: Janel Medford, MD; mild erythema and granularity of the colonic mucosa with benign biopsies, internal hemorrhoids suspected to be etiology of rectal bleeding.   COLONOSCOPY WITH PROPOFOL  N/A 09/16/2022   Procedure: COLONOSCOPY WITH PROPOFOL ;  Surgeon: Vinetta Greening, DO;  [facility]: [facility];  Service: Endoscopy;  Laterality: N/A;  10:15 am, asa 3   ESOPHAGOGASTRODUODENOSCOPY (EGD) WITH PROPOFOL  N/A 05/17/2021   Surgeon: Janel Medford, MD; very mild, nonspecific distal gastritis s/p biopsied, otherwise normal exam.  Gastric biopsy was benign, negative for H. pylori.   ESOPHAGOGASTRODUODENOSCOPY (EGD) WITH PROPOFOL  N/A 04/15/2022   Procedure: ESOPHAGOGASTRODUODENOSCOPY (EGD) WITH PROPOFOL ;  Surgeon: Vinetta Greening, DO;  [facility];  Service: Endoscopy;  Laterality: N/A;   FLEXIBLE SIGMOIDOSCOPY N/A 04/15/2022   Procedure: FLEXIBLE SIGMOIDOSCOPY;  Surgeon: Vinetta Greening, DO;  [facility];  Service: Endoscopy;  Laterality: N/A;  100pm, asa 3   GIVENS CAPSULE STUDY N/A 11/16/2021   Surgeon: Vinetta Greening, DO; poor and only  able to identify first gastric image.   GIVENS CAPSULE STUDY N/A 04/15/2022   Procedure: GIVENS CAPSULE STUDY;  Surgeon: Vinetta Greening, DO;  [facility];  Service: Endoscopy;  Laterality: N/A;    Current Outpatient Medications on File Prior to Visit  Medication Sig Dispense Refill   Accu-Chek Softclix Lancets lancets Use to check blood sugar fasting, daily. 100 each 12   acetaminophen  (TYLENOL ) 325 MG tablet Take 650 mg by mouth every 6 (six) hours as needed for moderate pain.     albuterol  (VENTOLIN  HFA) 108 (90 Base) MCG/ACT inhaler INHALE 2 PUFFS INTO THE LUNGS EVERY 4 HOURS AS NEEDED FOR WHEEZING OR SHORTNESS OF BREATH 6.7 g 1    ascorbic acid  (VITAMIN C) 500 MG tablet Take 1 tablet (500 mg total) by mouth daily. 30 tablet 1   atorvastatin  (LIPITOR) 10 MG tablet TAKE 1 TABLET(10 MG) BY MOUTH DAILY 90 tablet 3   Blood Glucose Monitoring Suppl (ONE TOUCH ULTRA 2) w/Device KIT 1 each by Does not apply route 3 (three) times daily. 1 kit 1   busPIRone  (BUSPAR ) 7.5 MG tablet Take 1 tablet (7.5 mg total) by mouth 2 (two) times daily. 60 tablet 3   cabergoline  (DOSTINEX ) 0.5 MG tablet Take 0.5 tablets (0.25 mg total) by mouth 2 (two) times a week. 10 tablet 1   cetirizine  (ZYRTEC ) 10 MG tablet Take 1 tablet (10 mg total) by mouth daily. 90 tablet 0   Cholecalciferol (VITAMIN D ) 50 MCG (2000 UT) tablet Take 2,000 Units by mouth daily.     ciclopirox  (PENLAC ) 8 % solution Apply topically at bedtime. Apply over nail and surrounding skin. Apply daily over previous coat. After seven (7) days, may remove with alcohol and continue cycle. 6.6 mL 5   clotrimazole -betamethasone  (LOTRISONE ) cream APPLY TOPICALLY TO THE AFFECTED AREA TWICE DAILY (Patient taking differently: Apply 1 Application topically 2 (two) times daily as needed (irritation).) 45 g 1   famotidine  (PEPCID ) 40 MG tablet Take 1 tablet (40 mg total) by mouth daily. 30 tablet 11   fluticasone  (FLONASE ) 50 MCG/ACT nasal spray Place 2 sprays into both nostrils daily as needed for allergies. 16 g 3   glucose blood test strip Use to check fasting blood sugar, daily. 100 each 12   hydrocortisone  (ANUSOL -HC) 25 MG suppository Place 1 suppository (25 mg total) rectally 2 (two) times daily. 30 suppository 1   Lancets MISC Check fbs daily 100 each 11   metoprolol  succinate (TOPROL -XL) 100 MG 24 hr tablet TAKE 1 TABLET(100 MG) BY MOUTH DAILY 30 tablet 6   Multiple Vitamin (MULTIVITAMIN) tablet Take 1 tablet by mouth daily.     naloxone  (NARCAN ) nasal spray 4 mg/0.1 mL Place 1 spray into the nose as needed IF FOUND UNRESPONSIVE AND CALL 911 IMMEDIATELY 2 each 1   ofloxacin  (OCUFLOX ) 0.3  % ophthalmic solution Place 1 drop into eye(s) 4 (four) times daily as needed for eye infection. 5 mL 0   omeprazole  (PRILOSEC) 40 MG capsule Take 1 capsule (40 mg total) by mouth daily. 30 minutes before breakfast 90 capsule 3   Oxycodone  HCl 10 MG TABS Take 1-2 tablets (10-20 mg total) by mouth 3 (three) times daily as needed. 180 tablet 0   Oxycodone  HCl 10 MG TABS Take 1-2 tablets (10-20 mg total) by mouth 3 (three) times daily as needed. 180 tablet 0   Oxycodone  HCl 10 MG TABS Take 1-2 tablets (10-20 mg total) by mouth 3 (three) times daily as  needed. 180 tablet 0   Oxycodone  HCl 10 MG TABS Take 1-2 tablets (10-20 mg total) by mouth 3 (three) times daily as needed 180 tablet 0   Oxycodone  HCl 10 MG TABS Take 1-2 tablets (10-20 mg total) by mouth 3 (three) times daily as needed. 180 tablet 0   prazosin  (MINIPRESS ) 2 MG capsule Take 1 capsule (2 mg total) by mouth at bedtime. 30 capsule 5   sodium chloride  (OCEAN) 0.65 % SOLN nasal spray Place 1 spray into both nostrils as needed for congestion. 88 mL 0   tirzepatide  (MOUNJARO ) 15 MG/0.5ML Pen Inject 15 mg into the skin once a week. 6 mL 3   venlafaxine  XR (EFFEXOR  XR) 75 MG 24 hr capsule Take 3 capsules (225 mg total) by mouth daily with breakfast. 270 capsule 3   zolpidem  (AMBIEN ) 10 MG tablet Take 1 tablet (10 mg total) by mouth at bedtime as needed for sleep. 30 tablet 0   zolpidem  (AMBIEN ) 10 MG tablet TAKE 1 TABLET(10 MG) BY MOUTH AT BEDTIME AS NEEDED FOR SLEEP 30 tablet 3   No current facility-administered medications on file prior to visit.   No Known Allergies Social History   Socioeconomic History   Marital status: Married    Spouse name: Not on file   Number of children: Not on file   Years of education: Not on file   Highest education level: Not on file  Occupational History   Occupation: stay at home spouse  Tobacco Use   Smoking status: Never   Smokeless tobacco: Never  Vaping Use   Vaping status: Never Used   Substance and Sexual Activity   Alcohol use: Not Currently    Comment: occasional   Drug use: Not Currently    Types: Marijuana    Comment: occasionally   Sexual activity: Yes  Other Topics Concern   Not on file  Social History Narrative   Lives at home with wife and two children.  Four children in all.     Social Drivers of Corporate investment banker Strain: Not on file  Food Insecurity: No Food Insecurity (04/02/2022)   Hunger Vital Sign    Worried About Running Out of Food in the Last Year: Never true    Ran Out of Food in the Last Year: Never true  Transportation Needs: No Transportation Needs (04/02/2022)   PRAPARE - Administrator, Civil Service (Medical): No    Lack of Transportation (Non-Medical): No  Physical Activity: Not on file  Stress: Not on file  Social Connections: Not on file  Intimate Partner Violence: Not At Risk (04/02/2022)   Humiliation, Afraid, Rape, and Kick questionnaire    Fear of Current or Ex-Partner: No    Emotionally Abused: No    Physically Abused: No    Sexually Abused: No    Review of Systems  Skin:  Positive for rash.  All other systems reviewed and are negative.      Objective:   Physical Exam Vitals reviewed.  Constitutional:      Appearance: He is obese. He is not ill-appearing or toxic-appearing.  Cardiovascular:     Rate and Rhythm: Normal rate and regular rhythm.     Heart sounds: Normal heart sounds.  Pulmonary:     Effort: Pulmonary effort is normal. No respiratory distress.     Breath sounds: Normal breath sounds. No stridor. No wheezing, rhonchi or rales.  Musculoskeletal:     Right shoulder: Tenderness present. No bony  tenderness or crepitus. Decreased range of motion.  Neurological:     Mental Status: He is alert.         Assessment & Plan:   Tendinopathy of rotator cuff, right  Abscess Patient has an abscess on his face that is spontaneously draining.  Begin Bactrim double strength tablets twice  daily for 7 days.  If worsening, I would amend incision in condition but hopefully this is a spontaneous rupture.  He also has persistent pain in his right rotator cuff despite receiving a cortisone injection almost 2 months ago.  Recommended proceeding with an MRI of the right shoulder to evaluate for possible tear in the supraspinatus tendon.

## 2023-06-04 NOTE — Telephone Encounter (Signed)
 Multiple acute visits with PCP_ needs to schedule annual exam- for June Requested Prescriptions  Pending Prescriptions Disp Refills   prazosin  (MINIPRESS ) 2 MG capsule [Pharmacy Med Name: PRAZOSIN  2MG  CAPSULES] 30 capsule 5    Sig: TAKE 1 CAPSULE(2 MG) BY MOUTH AT BEDTIME     Cardiovascular:  Alpha Blockers Failed - 06/04/2023  3:13 PM      Failed - Valid encounter within last 6 months    Recent Outpatient Visits           Yesterday Tendinopathy of rotator cuff, right   [place] [facility] Austine Lefort, MD   2 months ago Acute pain of right shoulder   [place] [facility] Austine Lefort, MD   2 months ago Fever, unspecified fever cause   [place] [facility] Austine Lefort, MD   3 months ago Type 2 diabetes mellitus with hyperglycemia, without long-term current use of insulin  [facility])   [place] [facility] Austine Lefort, MD   11 months ago Type 2 diabetes mellitus with hyperglycemia, without long-term current use of insulin  [facility])   [place] [facility] Pickard, Cisco Crest, MD              Passed - Last BP in normal range    BP Readings from Last 1 Encounters:  06/03/23 126/82

## 2023-06-06 ENCOUNTER — Encounter: Payer: Self-pay | Admitting: Family Medicine

## 2023-06-08 NOTE — Progress Notes (Deleted)
 VIRTUAL VISIT via TELEPHONE NOTE Ricardo Lopez   I connected with Ricardo Lopez.  on *** at  *** by telephone and verified that I am speaking with the correct person using two identifiers.  Location: Patient: Home Provider: [facility]   I discussed the limitations, risks, security and privacy concerns of performing an evaluation and management service by telephone and the availability of in person appointments. I also discussed with the patient that there may be a patient responsible charge related to this service. The patient expressed understanding and agreed to proceed.  REASON FOR VISIT: Microcytic anemia  INTERVAL HISTORY:   Ricardo Lopez 48 y.o. male returns for routine follow-up of microcytic anemia.  He was last seen by Ricardo Dines PA-C on 12/02/2022.    Patient denies any acute changes in his health, but reports that he "has good days and bad days," as he continues to cope with multiple chronic comorbidities, particularly COVID induced lung injury and oxygen dependence.*** ***  He reports that his energy waxes and wanes, but overall he remains moderately fatigued.   ***Ice pica has resolved.   ***He continues to take iron tablet daily, but this is causing some constipation.  *** Every other day?  *** ***He denies any symptoms of pain crises.  He continues to have intermittent hematochezia (2-3 x weekly), which was initially thought to be from hemorrhoids but has persisted despite banding.   ***He has ***% energy and low*** appetite.   His weight has been stable since his last visit.***  ASSESSMENT & PLAN:  1.   Microcytic anemia, sickle cell trait + alpha thalassemia trait - He reports that he has been anemic all his life.  No prior history of blood transfusion. - Personal history of sickle cell trait. - EGD (04/15/2022): Gastritis, otherwise normal esophagus and duodenum. - Flexible sigmoidoscopy (04/15/2022): Internal hemorrhoids.  He is being  considered for referral to colorectal surgery as he has not responded to conservative measures / hemorrhoid banding. - Hematology workup (04/02/2022): Hemoglobin fractionation cascade/hemoglobin solubility confirms sickle cell trait, but with Hgb-S observed at lower concentration than expected, which suggests possible sickle cell trait compounded by alpha thalassemia, or sickle cell trait with iron deficiency anemia. Normal copper , folate, B12, MMA. Iron panel (03/22/2022) with ferritin 153, iron saturation 17% Negative DAT.  Reticulocytes 1.2%.  Normal LDH. Mildly elevated kappa free light chains 27.6, normal lambda 16.8, normal FLC ratio 1.64.  SPEP and immunofixation normal. Alpha thalassemia DNA analysis consistent with carrier of alpha thalassemia single gene deviation - Received IV Feraheme x 1 on 04/29/2022. - Most recent labs (06/02/2023): Hgb 9.6/MCV 71.7 (more or less at baseline Hgb 9.5-10.5).  Ferritin 161, iron saturation 17%.  Normal creatinine 1.07/GFR >60. - Persistent hematochezia, which was initially thought to be from hemorrhoids but has persisted despite banding.*** - Ice pica resolved.  Moderate persistent fatigue.*** - He denies any symptoms of pain crises.*** - He is taking iron tablet daily for the past 3 years, but this is causing constipation*** every other day?  *** - Microcytic anemia primarily from sickle cell trait, alpha thalassemia trait, and iron deficiency related to rectal bleeding  - Discussed with patient that patients with sickle cell trait are at increased risk of chronic kidney disease. 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  Left kidney is  normal in location, no suspicious masses identified.  No evidence of hydronephrosis. - PLAN: Recommend switching to every other day iron for better  tolerance.*** - We will recheck CBC/D, CMP, iron panel in 6 months *** 1 year?  *** - If any significant worsening of creatinine in the future, we will refer to nephrology for further management. - No evidence of plasma cell dyscrasia based on repeat light chain/immunofixation/SPEP.  No further testing at this time.    2.  Social/family history: - He is currently on disability since long COVID (initial diagnosis 09/28/2019), which left him on continuous oxygen via nasal cannula.  He worked as a Architect prior to disability.  Non-smoker. - Mother has anemia.  Maternal grandmother had sickle cell trait.  Son has sickle cell trait.  Maternal grandfather had lung cancer.    PLAN SUMMARY: *** TBD 6 months versus 1 year *** >> Labs in 6 months = CBC/D, CMP, ferritin, iron/TIBC >> PHONE visit in 6 months (1 week after labs)  ** Last office visit 12/02/22 (alternating phone and office visits)***     REVIEW OF SYSTEMS: ***  ROS   PHYSICAL EXAM: (per limitations of virtual telephone visit)  The patient is alert and oriented x 3, exhibiting adequate mentation, good mood, and ability to speak in full sentences and execute sound judgement.***  WRAP UP: ***  I discussed the assessment and treatment plan with the patient. The patient was provided an opportunity to ask questions and all were answered. The patient agreed with the plan and demonstrated an understanding of the instructions.   The patient was advised to call back or seek an in-person evaluation if the symptoms worsen or if the condition fails to improve as anticipated.  I provided *** minutes of non-face-to-face time during this encounter.  Ricardo Dusky, PA-C ***

## 2023-06-09 ENCOUNTER — Encounter: Payer: Self-pay | Admitting: Family Medicine

## 2023-06-09 ENCOUNTER — Inpatient Hospital Stay: Payer: Medicare (Managed Care) | Admitting: Physician Assistant

## 2023-06-09 ENCOUNTER — Encounter: Payer: Self-pay | Admitting: Physician Assistant

## 2023-06-12 ENCOUNTER — Encounter: Payer: Self-pay | Admitting: Family Medicine

## 2023-06-13 ENCOUNTER — Encounter: Payer: Self-pay | Admitting: Family Medicine

## 2023-06-13 ENCOUNTER — Telehealth: Payer: Self-pay

## 2023-06-13 NOTE — Telephone Encounter (Signed)
 Hi Stana Ear, can you help me out with this? Thanks!  Copied from CRM [id number]. Topic: Clinical - Request for Lab/Test Order >> Jun 13, 2023 11:10 AM El Gravely T wrote: Reason for CRM: Curt Dover, from [facility] and radiology department.  Calling to inquire about a authorization and appointment  that was cancelled by Gentry Kief for imaging order for right shoulder.  Per Synethia, would like to know why order was cancelled, and if patient is till needing imaging.  Please call back at [phone] ext. [phone]

## 2023-06-14 ENCOUNTER — Other Ambulatory Visit: Payer: Medicare (Managed Care)

## 2023-06-16 ENCOUNTER — Telehealth: Payer: Medicare (Managed Care) | Admitting: Physician Assistant

## 2023-06-16 DIAGNOSIS — U071 COVID-19: Secondary | ICD-10-CM

## 2023-06-16 MED ORDER — NIRMATRELVIR/RITONAVIR (PAXLOVID)TABLET: 3.0000 | ORAL_TABLET | Freq: Two times a day (BID) | ORAL | 0 refills | Status: AC

## 2023-06-16 NOTE — Progress Notes (Signed)
 Virtual Visit Consent   Ricardo E Heilman Jr., you are scheduled for a virtual visit with a [facility] provider today. Just as with appointments in the office, your consent must be obtained to participate. Your consent will be active for this visit and any virtual visit you may have with one of our providers in the next 365 days. If you have a MyChart account, a copy of this consent can be sent to you electronically.  As this is a virtual visit, video technology does not allow for your provider to perform a traditional examination. This may limit your provider's ability to fully assess your condition. If your provider identifies any concerns that need to be evaluated in person or the need to arrange testing (such as labs, EKG, etc.), we will make arrangements to do so. Although advances in technology are sophisticated, we cannot ensure that it will always work on either your end or our end. If the connection with a video visit is poor, the visit may have to be switched to a telephone visit. With either a video or telephone visit, we are not always able to ensure that we have a secure connection.  By engaging in this virtual visit, you consent to the provision of healthcare and authorize for your insurance to be billed (if applicable) for the services provided during this visit. Depending on your insurance coverage, you may receive a charge related to this service.  I need to obtain your verbal consent now. Are you willing to proceed with your visit today? Ricardo Lopez. has provided verbal consent on 06/16/2023 for a virtual visit (video or telephone). Ricardo Kelp, PA-C  Date: 06/16/2023 2:53 PM   Virtual Visit via Video Note   I, Ricardo Lopez, connected with  Ricardo Lopez.  ([id number], 12-30-1975) on 06/16/23 at  2:45 PM EDT by a video-enabled telemedicine application and verified that I am speaking with the correct person using two identifiers.  Location: Patient: Virtual  Visit Location Patient: Home Provider: Virtual Visit Location Provider: Home Office   I discussed the limitations of evaluation and management by telemedicine and the availability of in person appointments. The patient expressed understanding and agreed to proceed.    History of Present Illness: Ricardo Lopez. is a 48 y.o. who identifies as a male who was assigned male at birth, and is being seen today for possible Covid 49.  HPI: URI  This is a new problem. The current episode started yesterday. The problem has been gradually worsening. The maximum temperature recorded prior to his arrival was 101 - 101.9 F. The fever has been present for Less than 1 day. Associated symptoms include congestion, coughing, headaches, a plugged ear sensation, rhinorrhea, sinus pain and a sore throat. Pertinent negatives include no chest pain, diarrhea, ear pain, nausea or vomiting. Associated symptoms comments: Body aches. Treatments tried: dayquil and nyquil. The treatment provided no relief.    Pulse oximeter: 98-99% on 3L, normally on 2L continuous via [place]  43 month old granddaughter tested positive for Covid 19 today and she resides with them  He has been hospitalized for Covid in the past  Problems:  Patient Active Problem List   Diagnosis Date Noted   Pituitary macroadenoma (HCC) 05/27/2023   Hyperprolactinemia (HCC) 05/27/2023   Hypogonadism, male 05/27/2023   DOE (dyspnea on exertion) 10/31/2022   Hemorrhoids 05/03/2022   Iron deficiency anemia 11/01/2021   Abdominal pain, epigastric 02/22/2021   Rectal bleeding 02/21/2021  Gastroesophageal reflux disease 02/21/2021   Hypoxia 04/17/2020   Sleep apnea 04/17/2020   Tachycardia 04/17/2020   Thoracic aortic aneurysm (HCC)    COVID    Gluteal abscess    Sepsis due to undetermined organism (HCC) 10/31/2019   Chronic respiratory failure with hypoxia since COVID 10/31/2019   Abscess and cellulitis of gluteal region 10/30/2019   Acute  respiratory disease due to COVID-19 virus 10/22/2019   E coli bacteremia 10/22/2019   Hyperglycemia 10/19/2019   Hypoalbuminemia 10/19/2019   Pneumonia due to COVID-19 virus 09/29/2019   Acute respiratory failure with hypoxia due to Covid  09/29/2019   Transaminitis 09/29/2019   AKI (acute kidney injury) (HCC) 09/29/2019   Anemia 09/29/2019   Chronic pain syndrome 10/15/2012   Osteochondral defect of ankle 05/26/2012   Ankle arthritis 05/26/2012   Right knee pain 05/26/2012   Medial meniscus, posterior horn derangement 05/26/2012   Morbid (severe) obesity due to excess calories (HCC)    Hypertension     Allergies: No Known Allergies Medications:  Current Outpatient Medications:    nirmatrelvir/ritonavir (PAXLOVID) 20 x 150 MG & 10 x 100MG  TABS, Take 3 tablets by mouth 2 (two) times daily for 5 days. (Take nirmatrelvir 150 mg two tablets twice daily for 5 days and ritonavir 100 mg one tablet twice daily for 5 days) Patient GFR is greater than 60, Disp: 30 tablet, Rfl: 0   Accu-Chek Softclix Lancets lancets, Use to check blood sugar fasting, daily., Disp: 100 each, Rfl: 12   acetaminophen  (TYLENOL ) 325 MG tablet, Take 650 mg by mouth every 6 (six) hours as needed for moderate pain., Disp: , Rfl:    albuterol  (VENTOLIN  HFA) 108 (90 Base) MCG/ACT inhaler, INHALE 2 PUFFS INTO THE LUNGS EVERY 4 HOURS AS NEEDED FOR WHEEZING OR SHORTNESS OF BREATH, Disp: 6.7 g, Rfl: 1   ascorbic acid  (VITAMIN C) 500 MG tablet, Take 1 tablet (500 mg total) by mouth daily., Disp: 30 tablet, Rfl: 1   atorvastatin  (LIPITOR) 10 MG tablet, TAKE 1 TABLET(10 MG) BY MOUTH DAILY, Disp: 90 tablet, Rfl: 3   Blood Glucose Monitoring Suppl (ONE TOUCH ULTRA 2) w/Device KIT, 1 each by Does not apply route 3 (three) times daily., Disp: 1 kit, Rfl: 1   busPIRone  (BUSPAR ) 7.5 MG tablet, Take 1 tablet (7.5 mg total) by mouth 2 (two) times daily., Disp: 60 tablet, Rfl: 3   cabergoline  (DOSTINEX ) 0.5 MG tablet, Take 0.5 tablets (0.25  mg total) by mouth 2 (two) times a week., Disp: 10 tablet, Rfl: 1   cetirizine  (ZYRTEC ) 10 MG tablet, TAKE 1 TABLET(10 MG) BY MOUTH DAILY, Disp: 90 tablet, Rfl: 0   Cholecalciferol (VITAMIN D ) 50 MCG (2000 UT) tablet, Take 2,000 Units by mouth daily., Disp: , Rfl:    ciclopirox  (PENLAC ) 8 % solution, Apply topically at bedtime. Apply over nail and surrounding skin. Apply daily over previous coat. After seven (7) days, may remove with alcohol and continue cycle., Disp: 6.6 mL, Rfl: 5   clotrimazole -betamethasone  (LOTRISONE ) cream, APPLY TOPICALLY TO THE AFFECTED AREA TWICE DAILY (Patient taking differently: Apply 1 Application topically 2 (two) times daily as needed (irritation).), Disp: 45 g, Rfl: 1   famotidine  (PEPCID ) 40 MG tablet, Take 1 tablet (40 mg total) by mouth daily., Disp: 30 tablet, Rfl: 11   fluticasone  (FLONASE ) 50 MCG/ACT nasal spray, Place 2 sprays into both nostrils daily as needed for allergies., Disp: 16 g, Rfl: 3   glucose blood test strip, Use to check fasting blood  sugar, daily., Disp: 100 each, Rfl: 12   hydrocortisone  (ANUSOL -HC) 25 MG suppository, Place 1 suppository (25 mg total) rectally 2 (two) times daily., Disp: 30 suppository, Rfl: 1   Lancets MISC, Check fbs daily, Disp: 100 each, Rfl: 11   metoprolol  succinate (TOPROL -XL) 100 MG 24 hr tablet, TAKE 1 TABLET(100 MG) BY MOUTH DAILY, Disp: 30 tablet, Rfl: 6   Multiple Vitamin (MULTIVITAMIN) tablet, Take 1 tablet by mouth daily., Disp: , Rfl:    naloxone  (NARCAN ) nasal spray 4 mg/0.1 mL, Place 1 spray into the nose as needed IF FOUND UNRESPONSIVE AND CALL 911 IMMEDIATELY, Disp: 2 each, Rfl: 1   ofloxacin  (OCUFLOX ) 0.3 % ophthalmic solution, Place 1 drop into eye(s) 4 (four) times daily as needed for eye infection., Disp: 5 mL, Rfl: 0   omeprazole  (PRILOSEC) 40 MG capsule, Take 1 capsule (40 mg total) by mouth daily. 30 minutes before breakfast, Disp: 90 capsule, Rfl: 3   Oxycodone  HCl 10 MG TABS, Take 1-2 tablets (10-20 mg  total) by mouth 3 (three) times daily as needed., Disp: 180 tablet, Rfl: 0   Oxycodone  HCl 10 MG TABS, Take 1-2 tablets (10-20 mg total) by mouth 3 (three) times daily as needed., Disp: 180 tablet, Rfl: 0   Oxycodone  HCl 10 MG TABS, Take 1-2 tablets (10-20 mg total) by mouth 3 (three) times daily as needed., Disp: 180 tablet, Rfl: 0   Oxycodone  HCl 10 MG TABS, Take 1-2 tablets (10-20 mg total) by mouth 3 (three) times daily as needed, Disp: 180 tablet, Rfl: 0   Oxycodone  HCl 10 MG TABS, Take 1-2 tablets (10-20 mg total) by mouth 3 (three) times daily as needed., Disp: 180 tablet, Rfl: 0   prazosin  (MINIPRESS ) 2 MG capsule, TAKE 1 CAPSULE(2 MG) BY MOUTH AT BEDTIME, Disp: 30 capsule, Rfl: 0   sodium chloride  (OCEAN) 0.65 % SOLN nasal spray, Place 1 spray into both nostrils as needed for congestion., Disp: 88 mL, Rfl: 0   sulfamethoxazole -trimethoprim  (BACTRIM  DS) 800-160 MG tablet, Take 1 tablet by mouth 2 (two) times daily., Disp: 14 tablet, Rfl: 0   tirzepatide  (MOUNJARO ) 15 MG/0.5ML Pen, Inject 15 mg into the skin once a week., Disp: 6 mL, Rfl: 3   venlafaxine  XR (EFFEXOR  XR) 75 MG 24 hr capsule, Take 3 capsules (225 mg total) by mouth daily with breakfast., Disp: 270 capsule, Rfl: 3   zolpidem  (AMBIEN ) 10 MG tablet, Take 1 tablet (10 mg total) by mouth at bedtime as needed for sleep., Disp: 30 tablet, Rfl: 0   zolpidem  (AMBIEN ) 10 MG tablet, TAKE 1 TABLET(10 MG) BY MOUTH AT BEDTIME AS NEEDED FOR SLEEP, Disp: 30 tablet, Rfl: 3  Observations/Objective: Patient is well-developed, well-nourished in no acute distress.  Resting comfortably at home.  Head is normocephalic, atraumatic.  No labored breathing.  Speech is clear and coherent with logical content.  Patient is alert and oriented at baseline.  On 3L O2 via [place]  Assessment and Plan: 1. COVID-19 (Primary) - nirmatrelvir/ritonavir (PAXLOVID) 20 x 150 MG & 10 x 100MG  TABS; Take 3 tablets by mouth 2 (two) times daily for 5 days. (Take  nirmatrelvir 150 mg two tablets twice daily for 5 days and ritonavir 100 mg one tablet twice daily for 5 days) Patient GFR is greater than 60  Dispense: 30 tablet; Refill: 0  - Continue OTC symptomatic management of choice - Will send OTC vitamins and supplement information through [facility] - Paxlovid prescribed - Patient enrolled in MyChart symptom monitoring - Push  fluids - Rest as needed - Discussed return precautions and when to seek in-person evaluation, sent via [facility]S as well   Follow Up Instructions: I discussed the assessment and treatment plan with the patient. The patient was provided an opportunity to ask questions and all were answered. The patient agreed with the plan and demonstrated an understanding of the instructions.  A copy of instructions were sent to the patient via MyChart unless otherwise noted below.    The patient was advised to call back or seek an in-person evaluation if the symptoms worsen or if the condition fails to improve as anticipated.    Ricardo Kelp, PA-C

## 2023-06-16 NOTE — Patient Instructions (Signed)
 Ricardo Lopez., thank you for joining Angelia Kelp, PA-C for today's virtual visit.  While this provider is not your primary care provider (PCP), if your PCP is located in our provider database this encounter information will be shared with them immediately following your visit.   A [facility] MyChart account gives you access to today's visit and all your visits, tests, and labs performed at [facility] " click here if you don't have a [facility] MyChart account or go to [email or web address]  Consent: (Patient) Ricardo Lopez. provided verbal consent for this virtual visit at the beginning of the encounter.  Current Medications:  Current Outpatient Medications:    nirmatrelvir/ritonavir (PAXLOVID) 20 x 150 MG & 10 x 100MG  TABS, Take 3 tablets by mouth 2 (two) times daily for 5 days. (Take nirmatrelvir 150 mg two tablets twice daily for 5 days and ritonavir 100 mg one tablet twice daily for 5 days) Patient GFR is greater than 60, Disp: 30 tablet, Rfl: 0   Accu-Chek Softclix Lancets lancets, Use to check blood sugar fasting, daily., Disp: 100 each, Rfl: 12   acetaminophen  (TYLENOL ) 325 MG tablet, Take 650 mg by mouth every 6 (six) hours as needed for moderate pain., Disp: , Rfl:    albuterol  (VENTOLIN  HFA) 108 (90 Base) MCG/ACT inhaler, INHALE 2 PUFFS INTO THE LUNGS EVERY 4 HOURS AS NEEDED FOR WHEEZING OR SHORTNESS OF BREATH, Disp: 6.7 g, Rfl: 1   ascorbic acid  (VITAMIN C) 500 MG tablet, Take 1 tablet (500 mg total) by mouth daily., Disp: 30 tablet, Rfl: 1   atorvastatin  (LIPITOR) 10 MG tablet, TAKE 1 TABLET(10 MG) BY MOUTH DAILY, Disp: 90 tablet, Rfl: 3   Blood Glucose Monitoring Suppl (ONE TOUCH ULTRA 2) w/Device KIT, 1 each by Does not apply route 3 (three) times daily., Disp: 1 kit, Rfl: 1   busPIRone  (BUSPAR ) 7.5 MG tablet, Take 1 tablet (7.5 mg total) by mouth 2 (two) times daily., Disp: 60 tablet, Rfl: 3   cabergoline  (DOSTINEX ) 0.5 MG tablet, Take 0.5  tablets (0.25 mg total) by mouth 2 (two) times a week., Disp: 10 tablet, Rfl: 1   cetirizine  (ZYRTEC ) 10 MG tablet, TAKE 1 TABLET(10 MG) BY MOUTH DAILY, Disp: 90 tablet, Rfl: 0   Cholecalciferol (VITAMIN D ) 50 MCG (2000 UT) tablet, Take 2,000 Units by mouth daily., Disp: , Rfl:    ciclopirox  (PENLAC ) 8 % solution, Apply topically at bedtime. Apply over nail and surrounding skin. Apply daily over previous coat. After seven (7) days, may remove with alcohol and continue cycle., Disp: 6.6 mL, Rfl: 5   clotrimazole -betamethasone  (LOTRISONE ) cream, APPLY TOPICALLY TO THE AFFECTED AREA TWICE DAILY (Patient taking differently: Apply 1 Application topically 2 (two) times daily as needed (irritation).), Disp: 45 g, Rfl: 1   famotidine  (PEPCID ) 40 MG tablet, Take 1 tablet (40 mg total) by mouth daily., Disp: 30 tablet, Rfl: 11   fluticasone  (FLONASE ) 50 MCG/ACT nasal spray, Place 2 sprays into both nostrils daily as needed for allergies., Disp: 16 g, Rfl: 3   glucose blood test strip, Use to check fasting blood sugar, daily., Disp: 100 each, Rfl: 12   hydrocortisone  (ANUSOL -HC) 25 MG suppository, Place 1 suppository (25 mg total) rectally 2 (two) times daily., Disp: 30 suppository, Rfl: 1   Lancets MISC, Check fbs daily, Disp: 100 each, Rfl: 11   metoprolol  succinate (TOPROL -XL) 100 MG 24 hr tablet, TAKE 1 TABLET(100 MG) BY MOUTH DAILY, Disp: 30 tablet, Rfl: 6  Multiple Vitamin (MULTIVITAMIN) tablet, Take 1 tablet by mouth daily., Disp: , Rfl:    naloxone  (NARCAN ) nasal spray 4 mg/0.1 mL, Place 1 spray into the nose as needed IF FOUND UNRESPONSIVE AND CALL 911 IMMEDIATELY, Disp: 2 each, Rfl: 1   ofloxacin  (OCUFLOX ) 0.3 % ophthalmic solution, Place 1 drop into eye(s) 4 (four) times daily as needed for eye infection., Disp: 5 mL, Rfl: 0   omeprazole  (PRILOSEC) 40 MG capsule, Take 1 capsule (40 mg total) by mouth daily. 30 minutes before breakfast, Disp: 90 capsule, Rfl: 3   Oxycodone  HCl 10 MG TABS, Take 1-2  tablets (10-20 mg total) by mouth 3 (three) times daily as needed., Disp: 180 tablet, Rfl: 0   Oxycodone  HCl 10 MG TABS, Take 1-2 tablets (10-20 mg total) by mouth 3 (three) times daily as needed., Disp: 180 tablet, Rfl: 0   Oxycodone  HCl 10 MG TABS, Take 1-2 tablets (10-20 mg total) by mouth 3 (three) times daily as needed., Disp: 180 tablet, Rfl: 0   Oxycodone  HCl 10 MG TABS, Take 1-2 tablets (10-20 mg total) by mouth 3 (three) times daily as needed, Disp: 180 tablet, Rfl: 0   Oxycodone  HCl 10 MG TABS, Take 1-2 tablets (10-20 mg total) by mouth 3 (three) times daily as needed., Disp: 180 tablet, Rfl: 0   prazosin  (MINIPRESS ) 2 MG capsule, TAKE 1 CAPSULE(2 MG) BY MOUTH AT BEDTIME, Disp: 30 capsule, Rfl: 0   sodium chloride  (OCEAN) 0.65 % SOLN nasal spray, Place 1 spray into both nostrils as needed for congestion., Disp: 88 mL, Rfl: 0   sulfamethoxazole -trimethoprim  (BACTRIM  DS) 800-160 MG tablet, Take 1 tablet by mouth 2 (two) times daily., Disp: 14 tablet, Rfl: 0   tirzepatide  (MOUNJARO ) 15 MG/0.5ML Pen, Inject 15 mg into the skin once a week., Disp: 6 mL, Rfl: 3   venlafaxine  XR (EFFEXOR  XR) 75 MG 24 hr capsule, Take 3 capsules (225 mg total) by mouth daily with breakfast., Disp: 270 capsule, Rfl: 3   zolpidem  (AMBIEN ) 10 MG tablet, Take 1 tablet (10 mg total) by mouth at bedtime as needed for sleep., Disp: 30 tablet, Rfl: 0   zolpidem  (AMBIEN ) 10 MG tablet, TAKE 1 TABLET(10 MG) BY MOUTH AT BEDTIME AS NEEDED FOR SLEEP, Disp: 30 tablet, Rfl: 3   Medications ordered in this encounter:  Meds ordered this encounter  Medications   nirmatrelvir/ritonavir (PAXLOVID) 20 x 150 MG & 10 x 100MG  TABS    Sig: Take 3 tablets by mouth 2 (two) times daily for 5 days. (Take nirmatrelvir 150 mg two tablets twice daily for 5 days and ritonavir 100 mg one tablet twice daily for 5 days) Patient GFR is greater than 60    Dispense:  30 tablet    Refill:  0    Supervising Provider:   Corine Dice [phone]      *If you need refills on other medications prior to your next appointment, please contact your pharmacy*  Follow-Up: Call back or seek an in-person evaluation if the symptoms worsen or if the condition fails to improve as anticipated.  [facility] [phone]  Care Instructions: Can take to lessen severity: Vit C 500mg  twice daily Quercertin 250-500mg  twice daily Zinc  75-100mg  daily Melatonin 3-6 mg at bedtime Vit D3 1000-2000 IU daily Aspirin 81 mg daily with food Optional: Famotidine  20mg  daily Also can add tylenol /ibuprofen as needed for fevers and body aches May add Mucinex  or Mucinex  DM as needed for cough/congestion  Isolation Instructions: You are to isolate at home until you have been fever free for at least 24 hours without a fever-reducing medication, and symptoms have been steadily improving for 24 hours. At that time,  you can end isolation but need to mask for an additional 5 days.   If you must be around other household members who do not have symptoms, you need to make sure that both you and the family members are masking consistently with a high-quality mask.  If you note any worsening of symptoms despite treatment, please seek an in-person evaluation ASAP. If you note any significant shortness of breath or any chest pain, please seek ER evaluation. Please do not delay care!   COVID-19: What to Do if You Are Sick If you test positive and are an older adult or someone who is at high risk of getting very sick from COVID-19, treatment may be available. Contact a healthcare provider right away after a positive test to determine if you are eligible, even if your symptoms are mild right now. You can also visit a Test to Treat location and, if eligible, receive a prescription from a provider. Don't delay: Treatment must be started within the first few days to be effective. If you have a fever, cough, or other symptoms, you might have COVID-19. Most people  have mild illness and are able to recover at home. If you are sick: Keep track of your symptoms. If you have an emergency warning sign (including trouble breathing), call 911. Steps to help prevent the spread of COVID-19 if you are sick If you are sick with COVID-19 or think you might have COVID-19, follow the steps below to care for yourself and to help protect other people in your home and community. Stay home except to get medical care Stay home. Most people with COVID-19 have mild illness and can recover at home without medical care. Do not leave your home, except to get medical care. Do not visit public areas and do not go to places where you are unable to wear a mask. Take care of yourself. Get rest and stay hydrated. Take over-the-counter medicines, such as acetaminophen , to help you feel better. Stay in touch with your doctor. Call before you get medical care. Be sure to get care if you have trouble breathing, or have any other emergency warning signs, or if you think it is an emergency. Avoid public transportation, ride-sharing, or taxis if possible. Get tested If you have symptoms of COVID-19, get tested. While waiting for test results, stay away from others, including staying apart from those living in your household. Get tested as soon as possible after your symptoms start. Treatments may be available for people with COVID-19 who are at risk for becoming very sick. Don't delay: Treatment must be started early to be effective--some treatments must begin within 5 days of your first symptoms. Contact your healthcare provider right away if your test result is positive to determine if you are eligible. Self-tests are one of several options for testing for the virus that causes COVID-19 and may be more convenient than laboratory-based tests and point-of-care tests. Ask your healthcare provider or your local health department if you need help interpreting your test results. You can visit your  state, tribal, local, and territorial health department's website to look for the latest local information on testing sites. Separate yourself from other people As much as possible, stay in a specific room and away from other people and pets in your  home. If possible, you should use a separate bathroom. If you need to be around other people or animals in or outside of the home, wear a well-fitting mask. Tell your close contacts that they may have been exposed to COVID-19. An infected person can spread COVID-19 starting 48 hours (or 2 days) before the person has any symptoms or tests positive. By letting your close contacts know they may have been exposed to COVID-19, you are helping to protect everyone. See COVID-19 and Animals if you have questions about pets. If you are diagnosed with COVID-19, someone from the health department may call you. Answer the call to slow the spread. Monitor your symptoms Symptoms of COVID-19 include fever, cough, or other symptoms. Follow care instructions from your healthcare provider and local health department. Your local health authorities may give instructions on checking your symptoms and reporting information. When to seek emergency medical attention Look for emergency warning signs* for COVID-19. If someone is showing any of these signs, seek emergency medical care immediately: Trouble breathing Persistent pain or pressure in the chest New confusion Inability to wake or stay awake Pale, gray, or blue-colored skin, lips, or nail beds, depending on skin tone *This list is not all possible symptoms. Please call your medical provider for any other symptoms that are severe or concerning to you. Call 911 or call ahead to your local emergency facility: Notify the operator that you are seeking care for someone who has or may have COVID-19. Call ahead before visiting your doctor Call ahead. Many medical visits for routine care are being postponed or done by phone or  telemedicine. If you have a medical appointment that cannot be postponed, call your doctor's office, and tell them you have or may have COVID-19. This will help the office protect themselves and other patients. If you are sick, wear a well-fitting mask You should wear a mask if you must be around other people or animals, including pets (even at home). Wear a mask with the best fit, protection, and comfort for you. You don't need to wear the mask if you are alone. If you can't put on a mask (because of trouble breathing, for example), cover your coughs and sneezes in some other way. Try to stay at least 6 feet away from other people. This will help protect the people around you. Masks should not be placed on young children under age 18 years, anyone who has trouble breathing, or anyone who is not able to remove the mask without help. Cover your coughs and sneezes Cover your mouth and nose with a tissue when you cough or sneeze. Throw away used tissues in a lined trash can. Immediately wash your hands with soap and water for at least 20 seconds. If soap and water are not available, clean your hands with an alcohol-based hand sanitizer that contains at least 60% alcohol. Clean your hands often Wash your hands often with soap and water for at least 20 seconds. This is especially important after blowing your nose, coughing, or sneezing; going to the bathroom; and before eating or preparing food. Use hand sanitizer if soap and water are not available. Use an alcohol-based hand sanitizer with at least 60% alcohol, covering all surfaces of your hands and rubbing them together until they feel dry. Soap and water are the best option, especially if hands are visibly dirty. Avoid touching your eyes, nose, and mouth with unwashed hands. Handwashing Tips Avoid sharing personal household items Do not share dishes, drinking glasses,  cups, eating utensils, towels, or bedding with other people in your home. Wash  these items thoroughly after using them with soap and water or put in the dishwasher. Clean surfaces in your home regularly Clean and disinfect high-touch surfaces (for example, doorknobs, tables, handles, light switches, and countertops) in your "sick room" and bathroom. In shared spaces, you should clean and disinfect surfaces and items after each use by the person who is ill. If you are sick and cannot clean, a caregiver or other person should only clean and disinfect the area around you (such as your bedroom and bathroom) on an as needed basis. Your caregiver/other person should wait as long as possible (at least several hours) and wear a mask before entering, cleaning, and disinfecting shared spaces that you use. Clean and disinfect areas that may have blood, stool, or body fluids on them. Use household cleaners and disinfectants. Clean visible dirty surfaces with household cleaners containing soap or detergent. Then, use a household disinfectant. Use a product from [employer] List N: Disinfectants for Coronavirus (COVID-19). Be sure to follow the instructions on the label to ensure safe and effective use of the product. Many products recommend keeping the surface wet with a disinfectant for a certain period of time (look at "contact time" on the product label). You may also need to wear personal protective equipment, such as gloves, depending on the directions on the product label. Immediately after disinfecting, wash your hands with soap and water for 20 seconds. For completed guidance on cleaning and disinfecting your home, visit Complete Disinfection Guidance. Take steps to improve ventilation at home Improve ventilation (air flow) at home to help prevent from spreading COVID-19 to other people in your household. Clear out COVID-19 virus particles in the air by opening windows, using air filters, and turning on fans in your home. Use this interactive tool to learn how to improve air flow in your  home. When you can be around others after being sick with COVID-19 Deciding when you can be around others is different for different situations. Find out when you can safely end home isolation. For any additional questions about your care, contact your healthcare provider or state or local health department. 04/11/2020 Content source: [facility] ([facility]), Division of Viral Diseases This information is not intended to replace advice given to you by your health care provider. Make sure you discuss any questions you have with your health care provider. Document Revised: 05/25/2020 Document Reviewed: 05/25/2020 Elsevier Patient Education  2022 [employer].     If you have been instructed to have an in-person evaluation today at a local Urgent Care facility, please use the link below. It will take you to a list of all of our available [facility], including address, phone number and hours of operation. Please do not delay care.  [facility]  If you or a family member do not have a primary care provider, use the link below to schedule a visit and establish care. When you choose a [facility] primary care physician or advanced practice provider, you gain a long-term partner in health. Find a Primary Care Provider  Learn more about [facility] in-office and virtual care options: [facility] Get Care Now

## 2023-06-21 ENCOUNTER — Ambulatory Visit
Admission: RE | Admit: 2023-06-21 | Discharge: 2023-06-21 | Disposition: A | Discharge status: Home / Self Care | Payer: Medicare (Managed Care) | Source: Ambulatory Visit | Attending: Family Medicine | Admitting: Family Medicine

## 2023-06-21 DIAGNOSIS — M67911 Unspecified disorder of synovium and tendon, right shoulder: Secondary | ICD-10-CM

## 2023-06-26 ENCOUNTER — Other Ambulatory Visit (HOSPITAL_COMMUNITY): Payer: Self-pay

## 2023-06-26 MED ORDER — OXYCODONE HCL 10 MG PO TABS
10.0000 mg | ORAL_TABLET | Freq: Three times a day (TID) | ORAL | 0 refills | Status: DC | PRN
  Filled 2023-06-26: qty 180, 30d supply, fill #0

## 2023-06-30 ENCOUNTER — Ambulatory Visit: Payer: Self-pay | Admitting: Family Medicine

## 2023-06-30 NOTE — Progress Notes (Signed)
 Pt has been informed by phone. Understands plan of treatment, as well to contact orthopedics should pain persist. Confirmed appt. For AWV shheduled for 07/02/2023 @ 8:50 telephone .

## 2023-07-01 ENCOUNTER — Other Ambulatory Visit: Payer: Self-pay | Admitting: Family Medicine

## 2023-07-01 ENCOUNTER — Other Ambulatory Visit: Payer: Self-pay | Admitting: Cardiology

## 2023-07-02 ENCOUNTER — Ambulatory Visit: Payer: Medicare (Managed Care)

## 2023-07-02 VITALS — Ht 68.0 in | Wt >= 6400 oz

## 2023-07-02 DIAGNOSIS — Z0000 Encounter for general adult medical examination without abnormal findings: Secondary | ICD-10-CM | POA: Diagnosis not present

## 2023-07-02 NOTE — Progress Notes (Signed)
 Subjective:   Ricardo Lopez. is a 48 y.o. who presents for a Medicare Wellness preventive visit.  As a reminder, Annual Wellness Visits don't include a physical exam, and some assessments may be limited, especially if this visit is performed virtually. We may recommend an in-person follow-up visit with your provider if needed.  Visit Complete: Virtual I connected with  Alessandra Hussar. on 07/02/23 by a audio enabled telemedicine application and verified that I am speaking with the correct person using two identifiers.  Patient Location: Home  Provider Location: Home Office  I discussed the limitations of evaluation and management by telemedicine. The patient expressed understanding and agreed to proceed.  Vital Signs: Because this visit was a virtual/telehealth visit, some criteria may be missing or patient reported. Any vitals not documented were not able to be obtained and vitals that have been documented are patient reported.  VideoDeclined- This patient declined Librarian, academic. Therefore the visit was completed with audio only.  Persons Participating in Visit: Patient assisted by spouse.  AWV Questionnaire: No: Patient Medicare AWV questionnaire was not completed prior to this visit.  Cardiac Risk Factors include: male gender;sedentary lifestyle;obesity (BMI >30kg/m2);hypertension     Objective:     Today's Vitals   07/02/23 0848  Weight: (!) 428 lb (194.1 kg)  Height: 5' 8 (1.727 m)   Body mass index is 65.08 kg/m.     07/02/2023    9:17 AM 12/02/2022    3:06 PM 09/16/2022    9:39 AM 09/11/2022   11:41 AM 07/29/2022    9:41 AM 04/29/2022    4:08 PM 04/23/2022    8:46 AM  Advanced Directives  Does Patient Have a Medical Advance Directive? No No No No No No No  Would patient like information on creating a medical advance directive? Yes (MAU/Ambulatory/Procedural Areas - Information given) No - Patient declined No - Patient declined No  - Patient declined No - Patient declined No - Patient declined No - Patient declined    Current Medications (verified) Outpatient Encounter Medications as of 07/02/2023  Medication Sig   Accu-Chek Softclix Lancets lancets Use to check blood sugar fasting, daily.   acetaminophen  (TYLENOL ) 325 MG tablet Take 650 mg by mouth every 6 (six) hours as needed for moderate pain.   albuterol  (VENTOLIN  HFA) 108 (90 Base) MCG/ACT inhaler INHALE 2 PUFFS INTO THE LUNGS EVERY 4 HOURS AS NEEDED FOR WHEEZING OR SHORTNESS OF BREATH   ascorbic acid  (VITAMIN C) 500 MG tablet Take 1 tablet (500 mg total) by mouth daily.   atorvastatin  (LIPITOR) 10 MG tablet TAKE 1 TABLET(10 MG) BY MOUTH DAILY   Blood Glucose Monitoring Suppl (ONE TOUCH ULTRA 2) w/Device KIT 1 each by Does not apply route 3 (three) times daily.   busPIRone  (BUSPAR ) 7.5 MG tablet TAKE 1 TABLET(7.5 MG) BY MOUTH TWICE DAILY   cabergoline  (DOSTINEX ) 0.5 MG tablet Take 0.5 tablets (0.25 mg total) by mouth 2 (two) times a week.   cetirizine  (ZYRTEC ) 10 MG tablet TAKE 1 TABLET(10 MG) BY MOUTH DAILY   Cholecalciferol (VITAMIN D ) 50 MCG (2000 UT) tablet Take 2,000 Units by mouth daily.   ciclopirox  (PENLAC ) 8 % solution Apply topically at bedtime. Apply over nail and surrounding skin. Apply daily over previous coat. After seven (7) days, may remove with alcohol and continue cycle.   clotrimazole -betamethasone  (LOTRISONE ) cream APPLY TOPICALLY TO THE AFFECTED AREA TWICE DAILY (Patient taking differently: Apply 1 Application topically 2 (two) times  daily as needed (irritation).)   famotidine  (PEPCID ) 40 MG tablet Take 1 tablet (40 mg total) by mouth daily.   fluticasone  (FLONASE ) 50 MCG/ACT nasal spray Place 2 sprays into both nostrils daily as needed for allergies.   glucose blood test strip Use to check fasting blood sugar, daily.   hydrocortisone  (ANUSOL -HC) 25 MG suppository Place 1 suppository (25 mg total) rectally 2 (two) times daily.   Lancets MISC  Check fbs daily   metoprolol  succinate (TOPROL -XL) 100 MG 24 hr tablet TAKE 1 TABLET(100 MG) BY MOUTH DAILY   Multiple Vitamin (MULTIVITAMIN) tablet Take 1 tablet by mouth daily.   naloxone  (NARCAN ) nasal spray 4 mg/0.1 mL Place 1 spray into the nose as needed IF FOUND UNRESPONSIVE AND CALL 911 IMMEDIATELY   ofloxacin  (OCUFLOX ) 0.3 % ophthalmic solution Place 1 drop into eye(s) 4 (four) times daily as needed for eye infection.   omeprazole  (PRILOSEC) 40 MG capsule Take 1 capsule (40 mg total) by mouth daily. 30 minutes before breakfast   Oxycodone  HCl 10 MG TABS Take 1-2 tablets (10-20 mg total) by mouth 3 (three) times daily as needed.   Oxycodone  HCl 10 MG TABS Take 1-2 tablets (10-20 mg total) by mouth 3 (three) times daily as needed.   Oxycodone  HCl 10 MG TABS Take 1-2 tablets (10-20 mg total) by mouth 3 (three) times daily as needed.   Oxycodone  HCl 10 MG TABS Take 1-2 tablets (10-20 mg total) by mouth 3 (three) times daily as needed   Oxycodone  HCl 10 MG TABS Take 1-2 tablets (10-20 mg total) by mouth 3 (three) times daily as needed.   prazosin  (MINIPRESS ) 2 MG capsule TAKE 1 CAPSULE(2 MG) BY MOUTH AT BEDTIME   sodium chloride  (OCEAN) 0.65 % SOLN nasal spray Place 1 spray into both nostrils as needed for congestion.   sulfamethoxazole -trimethoprim  (BACTRIM  DS) 800-160 MG tablet Take 1 tablet by mouth 2 (two) times daily.   tirzepatide  (MOUNJARO ) 15 MG/0.5ML Pen Inject 15 mg into the skin once a week.   venlafaxine  XR (EFFEXOR  XR) 75 MG 24 hr capsule Take 3 capsules (225 mg total) by mouth daily with breakfast.   zolpidem  (AMBIEN ) 10 MG tablet TAKE 1 TABLET(10 MG) BY MOUTH AT BEDTIME AS NEEDED FOR SLEEP   [DISCONTINUED] zolpidem  (AMBIEN ) 10 MG tablet Take 1 tablet (10 mg total) by mouth at bedtime as needed for sleep.   No facility-administered encounter medications on file as of 07/02/2023.    Allergies (verified) Patient has no known allergies.   History: Past Medical History:   Diagnosis Date   Anemia    Anxiety and depression    B12 deficiency    Back pain    COPD (chronic obstructive pulmonary disease) (HCC)    COVID    9/21- hypoxia requiring oxygen   Diabetes mellitus without complication (HCC)    Frequency of urination    GERD (gastroesophageal reflux disease)    Gluteal abscess    ecoli after covid hospitalization 2021   Headache    Hypercholesterolemia    Hypertension    Obesity    Oxygen dependent    o2@ 2L   Scar tissue    Scar Tissue on lungs due to Covid   Sleep apnea    Stomach ulcer    Tachycardia    Thoracic aortic aneurysm [facility])    Past Surgical History:  Procedure Laterality Date   BIOPSY  05/17/2021   Procedure: BIOPSY;  Surgeon: Janel Medford, MD;  Location: [facility];  Service: Gastroenterology;;  EGD and COLON   BIOPSY  09/16/2022   Procedure: BIOPSY;  Surgeon: Vinetta Greening, DO;  [facility];  Service: Endoscopy;;   COLONOSCOPY WITH PROPOFOL  N/A 05/17/2021   Surgeon: Janel Medford, MD; mild erythema and granularity of the colonic mucosa with benign biopsies, internal hemorrhoids suspected to be etiology of rectal bleeding.   COLONOSCOPY WITH PROPOFOL  N/A 09/16/2022   Procedure: COLONOSCOPY WITH PROPOFOL ;  Surgeon: Vinetta Greening, DO;  [facility];  Service: Endoscopy;  Laterality: N/A;  10:15 am, asa 3   ESOPHAGOGASTRODUODENOSCOPY (EGD) WITH PROPOFOL  N/A 05/17/2021   Surgeon: Janel Medford, MD; very mild, nonspecific distal gastritis s/p biopsied, otherwise normal exam.  Gastric biopsy was benign, negative for H. pylori.   ESOPHAGOGASTRODUODENOSCOPY (EGD) WITH PROPOFOL  N/A 04/15/2022   Procedure: ESOPHAGOGASTRODUODENOSCOPY (EGD) WITH PROPOFOL ;  Surgeon: Vinetta Greening, DO;  [facility];  Service: Endoscopy;  Laterality: N/A;   FLEXIBLE SIGMOIDOSCOPY N/A 04/15/2022   Procedure: FLEXIBLE SIGMOIDOSCOPY;  Surgeon: Vinetta Greening, DO;  [facility];  Service:  Endoscopy;  Laterality: N/A;  100pm, asa 3   GIVENS CAPSULE STUDY N/A 11/16/2021   Surgeon: Vinetta Greening, DO; poor and only able to identify first gastric image.   GIVENS CAPSULE STUDY N/A 04/15/2022   Procedure: GIVENS CAPSULE STUDY;  Surgeon: Vinetta Greening, DO;  [facility];  Service: Endoscopy;  Laterality: N/A;   Family History  Problem Relation Age of Onset   Diabetes Mother    Hypertension Mother    Hyperlipidemia Mother    Thyroid disease Mother    Diabetes Father    Hyperlipidemia Father    Hypertension Father    Depression Father    Anxiety disorder Father    Sleep apnea Father    Colon cancer Neg Hx    Stomach cancer Neg Hx    Social History   Socioeconomic History   Marital status: Married    Spouse name: Not on file   Number of children: Not on file   Years of education: Not on file   Highest education level: Not on file  Occupational History   Occupation: stay at home spouse  Tobacco Use   Smoking status: Never   Smokeless tobacco: Never  Vaping Use   Vaping status: Never Used  Substance and Sexual Activity   Alcohol use: Not Currently    Comment: occasional   Drug use: Not Currently    Types: Marijuana    Comment: occasionally   Sexual activity: Yes  Other Topics Concern   Not on file  Social History Narrative   Lives at home with wife and two children.  Four children in all.     Social Drivers of Corporate investment banker Strain: Low Risk  (07/02/2023)   Overall Financial Resource Strain (CARDIA)    Difficulty of Paying Living Expenses: Not hard at all  Food Insecurity: No Food Insecurity (07/02/2023)   Hunger Vital Sign    Worried About Running Out of Food in the Last Year: Never true    Ran Out of Food in the Last Year: Never true  Transportation Needs: No Transportation Needs (07/02/2023)   PRAPARE - Administrator, Civil Service (Medical): No    Lack of Transportation (Non-Medical): No  Physical Activity:  Inactive (07/02/2023)   Exercise Vital Sign    Days of Exercise per Week: 0 days    Minutes of Exercise  per Session: 0 min  Stress: No Stress Concern Present (07/02/2023)   [employer] of Occupational Health - Occupational Stress Questionnaire    Feeling of Stress : Only a little  Social Connections: Moderately Integrated (07/02/2023)   Social Connection and Isolation Panel [NHANES]    Frequency of Communication with Friends and Family: More than three times a week    Frequency of Social Gatherings with Friends and Family: Three times a week    Attends Religious Services: More than 4 times per year    Active Member of Clubs or Organizations: No    Attends Banker Meetings: Never    Marital Status: Married    Tobacco Counseling Counseling given: Not Answered    Clinical Intake:  Pre-visit preparation completed: Yes  Pain : No/denies pain     Diabetes: No  Lab Results  Component Value Date   HGBA1C 6.0 (H) 02/25/2023   HGBA1C 6.1 (H) 07/02/2022   HGBA1C 6.3 (H) 10/18/2021     How often do you need to have someone help you when you read instructions, pamphlets, or other written materials from your doctor or pharmacy?: 1 - Never  Interpreter Needed?: No  Information entered by :: Seabron Cypress LPN   Activities of Daily Living     07/02/2023    8:50 AM 09/11/2022   11:43 AM  In your present state of health, do you have any difficulty performing the following activities:  Hearing? 0   Vision? 0   Difficulty concentrating or making decisions? 0   Walking or climbing stairs? 1   Dressing or bathing? 0   Doing errands, shopping? 1 0  Preparing Food and eating ? N   Using the Toilet? N   In the past six months, have you accidently leaked urine? N   Do you have problems with loss of bowel control? N   Managing your Medications? N   Managing your Finances? N   Housekeeping or managing your Housekeeping? Y     Patient Care Team: Austine Lefort,  MD as PCP - General (Family Medicine) Eilleen Grates, MD as PCP - Cardiology (Cardiology) Paulett Boros, MD as Medical Oncologist (Hematology) Baby Bolt, MD as Consulting Physician (Endocrinology) Diamond Formica, MD as Consulting Physician (Pulmonary Disease)  I have updated your Care Teams any recent Medical Services you may have received from other providers in the past year.     Assessment:    This is a routine wellness examination for Jc.  Hearing/Vision screen Hearing Screening - Comments:: Denies hearing difficulties   Vision Screening - Comments:: up to date with routine eye exams with [facility]    Goals Addressed             This Visit's Progress    DIET - DECREASE SODA OR JUICE INTAKE         Depression Screen     07/02/2023    9:16 AM 06/03/2023    9:02 AM 03/18/2023    3:06 PM 02/25/2023   12:26 PM 07/02/2022    8:53 AM 04/02/2022    8:36 AM 10/18/2021    4:07 PM  PHQ 2/9 Scores  PHQ - 2 Score 0 0  0 3 1 2   PHQ- 9 Score     9  10  Exception Documentation   Patient refusal        Fall Risk     07/02/2023    9:18 AM 06/03/2023    9:02  AM 03/18/2023    3:05 PM 02/25/2023   12:26 PM 07/02/2022    8:53 AM  Fall Risk   Falls in the past year? 1 1 0 0 0  Number falls in past yr: 1 1 0 0 0  Injury with Fall? 1 1 0 0 0  Risk for fall due to : History of fall(s);Impaired balance/gait;Impaired mobility History of fall(s);Impaired balance/gait;Impaired mobility No Fall Risks No Fall Risks No Fall Risks  Follow up Education provided;Falls prevention discussed;Falls evaluation completed Falls prevention discussed;Falls evaluation completed Falls prevention discussed;Falls evaluation completed Falls prevention discussed Falls prevention discussed    MEDICARE RISK AT HOME:  Medicare Risk at Home Any stairs in or around the home?: No If so, are there any without handrails?: No Home free of loose throw rugs in walkways, pet beds, electrical  cords, etc?: Yes Adequate lighting in your home to reduce risk of falls?: Yes Life alert?: No Use of a cane, walker or w/c?: No Grab bars in the bathroom?: Yes Shower chair or bench in shower?: No Elevated toilet seat or a handicapped toilet?: Yes  TIMED UP AND GO:  Was the test performed?  No  Cognitive Function: 6CIT completed        07/02/2023    9:18 AM  6CIT Screen  What Year? 0 points  What month? 0 points  What time? 0 points  Count back from 20 0 points  Months in reverse 0 points  Repeat phrase 0 points  Total Score 0 points    Immunizations Immunization History  Administered Date(s) Administered   Influenza,inj,Quad PF,6+ Mos 11/23/2019   Influenza-Unspecified 10/08/2021    Screening Tests Health Maintenance  Topic Date Due   COVID-19 Vaccine (1) Never done   Diabetic kidney evaluation - Urine ACR  05/18/2021   Pneumococcal Vaccine 1-67 Years old (1 of 2 - PCV) 02/25/2024 (Originally 12/20/1994)   INFLUENZA VACCINE  08/22/2023   Diabetic kidney evaluation - eGFR measurement  06/01/2024   Medicare Annual Wellness (AWV)  07/01/2024   Colonoscopy  09/15/2032   Hepatitis C Screening  Completed   HIV Screening  Completed   HPV VACCINES  Aged Out   Meningococcal B Vaccine  Aged Out   DTaP/Tdap/Td  Discontinued    Health Maintenance  Health Maintenance Due  Topic Date Due   COVID-19 Vaccine (1) Never done   Diabetic kidney evaluation - Urine ACR  05/18/2021   Health Maintenance Items Addressed: Information provided on Prevnar   Additional Screening:  Vision Screening: Recommended annual ophthalmology exams for early detection of glaucoma and other disorders of the eye. Would you like a referral to an eye doctor? No    Dental Screening: Recommended annual dental exams for proper oral hygiene  Community Resource Referral / Chronic Care Management: CRR required this visit?  No   CCM required this visit?  No   Plan:    I have personally  reviewed and noted the following in the patient's chart:   Medical and social history Use of alcohol, tobacco or illicit drugs  Current medications and supplements including opioid prescriptions. Patient is currently taking opioid prescriptions. Information provided to patient regarding non-opioid alternatives. Patient advised to discuss non-opioid treatment plan with their provider. Functional ability and status Nutritional status Physical activity Advanced directives List of other physicians Hospitalizations, surgeries, and ER visits in previous 12 months Vitals Screenings to include cognitive, depression, and falls Referrals and appointments  In addition, I have reviewed and discussed with patient  certain preventive protocols, quality metrics, and best practice recommendations. A written personalized care plan for preventive services as well as general preventive health recommendations were provided to patient.   Seabron Cypress [place], [place]   9/56/2130   After Visit Summary: (MyChart) Due to this being a telephonic visit, the after visit summary with patients personalized plan was offered to patient via MyChart   Notes: Nothing significant to report at this time.

## 2023-07-02 NOTE — Patient Instructions (Signed)
 Mr. Ricardo Lopez , Thank you for taking time out of your busy schedule to complete your Annual Wellness Visit with me. I enjoyed our conversation and look forward to speaking with you again next year. I, as well as your care team,  appreciate your ongoing commitment to your health goals. Please review the following plan we discussed and let me know if I can assist you in the future. Your Game plan/ To Do List    Follow up Visits: Next Medicare AWV with our clinical staff: In 1 year    Have you seen your provider in the last 6 months (3 months if uncontrolled diabetes)? Yes Next Office Visit with your provider: To be scheduled   Clinician Recommendations:  Aim for 30 minutes of exercise or brisk walking, 6-8 glasses of water, and 5 servings of fruits and vegetables each day.       This is a list of the screening recommended for you and due dates:  Health Maintenance  Topic Date Due   COVID-19 Vaccine (1) Never done   Yearly kidney health urinalysis for diabetes  05/18/2021   Pneumococcal Vaccination (1 of 2 - PCV) 02/25/2024*   Flu Shot  08/22/2023   Yearly kidney function blood test for diabetes  06/01/2024   Medicare Annual Wellness Visit  07/01/2024   Colon Cancer Screening  09/15/2032   Hepatitis C Screening  Completed   HIV Screening  Completed   HPV Vaccine  Aged Out   Meningitis B Vaccine  Aged Out   DTaP/Tdap/Td vaccine  Discontinued  *Topic was postponed. The date shown is not the original due date.    Advanced directives: (ACP Link)Information on Advanced Care Planning can be found at [place]  Secretary of [facility] Advance Health Care Directives Advance Health Care Directives. [email or web address]   Advance Care Planning is important because it:  [x]  Makes sure you receive the medical care that is consistent with your values, goals, and preferences  [x]  It provides guidance to your family and loved ones and reduces their decisional burden about whether or not they are making the right  decisions based on your wishes.  Follow the link provided in your after visit summary or read over the paperwork we have mailed to you to help you started getting your Advance Directives in place. If you need assistance in completing these, please reach out to us  so that we can help you!  See attachments for Preventive Care and Fall Prevention Tips.

## 2023-07-05 ENCOUNTER — Other Ambulatory Visit: Payer: Self-pay | Admitting: Family Medicine

## 2023-07-06 ENCOUNTER — Other Ambulatory Visit: Payer: Self-pay | Admitting: Family Medicine

## 2023-07-21 ENCOUNTER — Ambulatory Visit: Payer: Self-pay | Admitting: "Endocrinology

## 2023-07-24 ENCOUNTER — Other Ambulatory Visit (HOSPITAL_BASED_OUTPATIENT_CLINIC_OR_DEPARTMENT_OTHER): Payer: Self-pay

## 2023-07-24 ENCOUNTER — Other Ambulatory Visit (HOSPITAL_COMMUNITY): Payer: Self-pay

## 2023-07-24 MED ORDER — OXYCODONE HCL 10 MG PO TABS
10.0000 mg | ORAL_TABLET | Freq: Three times a day (TID) | ORAL | 0 refills | Status: DC | PRN
  Filled 2023-07-24 – 2023-07-26 (×2): qty 180, 30d supply, fill #0

## 2023-07-26 ENCOUNTER — Other Ambulatory Visit (HOSPITAL_BASED_OUTPATIENT_CLINIC_OR_DEPARTMENT_OTHER): Payer: Self-pay

## 2023-07-30 ENCOUNTER — Other Ambulatory Visit: Payer: Self-pay | Admitting: Family Medicine

## 2023-08-09 ENCOUNTER — Other Ambulatory Visit: Payer: Self-pay | Admitting: Family Medicine

## 2023-08-09 DIAGNOSIS — E1169 Type 2 diabetes mellitus with other specified complication: Secondary | ICD-10-CM

## 2023-08-12 NOTE — Telephone Encounter (Signed)
 Requested Prescriptions  Pending Prescriptions Disp Refills   atorvastatin  (LIPITOR) 10 MG tablet [Pharmacy Med Name: ATORVASTATIN  10MG  TABLETS] 90 tablet 1    Sig: TAKE 1 TABLET(10 MG) BY MOUTH DAILY     Cardiovascular:  Antilipid - Statins Failed - 08/12/2023 11:12 AM      Failed - Lipid Panel in normal range within the last 12 months    Cholesterol, Total  Date Value Ref Range Status  05/18/2020 221 (H) 100 - 199 mg/dL Final   Cholesterol  Date Value Ref Range Status  02/27/2023 165 <200 mg/dL Final   LDL Cholesterol (Calc)  Date Value Ref Range Status  02/27/2023 96 mg/dL (calc) Final    Comment:    Reference range: <100 . Desirable range <100 mg/dL for primary prevention;   <70 mg/dL for patients with CHD or diabetic patients  with > or = 2 CHD risk factors. SABRA LDL-C is now calculated using the Martin-Hopkins  calculation, which is a validated novel method providing  better accuracy than the Friedewald equation in the  estimation of LDL-C.  Gladis APPLETHWAITE et al. SANDREA. 7986;689(80): 2061-2068  (http://education.[facility].com/faq/FAQ164)    HDL  Date Value Ref Range Status  02/27/2023 44 > OR = 40 mg/dL Final  95/71/7977 53 >60 mg/dL Final   Triglycerides  Date Value Ref Range Status  02/27/2023 148 <150 mg/dL Final         Passed - Patient is not pregnant      Passed - Valid encounter within last 12 months    Recent Outpatient Visits           2 months ago Tendinopathy of rotator cuff, right   [place] [facility] Duanne, Butler DASEN, MD   4 months ago Acute pain of right shoulder   [place] [facility] Duanne Butler DASEN, MD   4 months ago Fever, unspecified fever cause   [place] [facility] Duanne Butler DASEN, MD   5 months ago Type 2 diabetes mellitus with hyperglycemia, without long-term current use of insulin  [facility])   [facility] Duanne Butler DASEN, MD   1 year ago  Type 2 diabetes mellitus with hyperglycemia, without long-term current use of insulin  [facility])    [facility] Pickard, Butler DASEN, MD

## 2023-08-24 ENCOUNTER — Other Ambulatory Visit (HOSPITAL_COMMUNITY): Payer: Self-pay

## 2023-08-24 MED ORDER — OXYCODONE HCL 10 MG PO TABS
10.0000 mg | ORAL_TABLET | Freq: Three times a day (TID) | ORAL | 0 refills | Status: DC | PRN
  Filled 2023-08-25: qty 180, 30d supply, fill #0

## 2023-08-25 ENCOUNTER — Other Ambulatory Visit (HOSPITAL_BASED_OUTPATIENT_CLINIC_OR_DEPARTMENT_OTHER): Payer: Self-pay

## 2023-08-25 ENCOUNTER — Other Ambulatory Visit (HOSPITAL_COMMUNITY): Payer: Self-pay

## 2023-08-25 ENCOUNTER — Other Ambulatory Visit: Payer: Self-pay

## 2023-08-28 ENCOUNTER — Other Ambulatory Visit: Payer: Self-pay | Admitting: Family Medicine

## 2023-09-02 ENCOUNTER — Other Ambulatory Visit: Payer: Self-pay | Admitting: Family Medicine

## 2023-09-02 DIAGNOSIS — R0982 Postnasal drip: Secondary | ICD-10-CM

## 2023-09-03 ENCOUNTER — Other Ambulatory Visit: Payer: Self-pay | Admitting: *Deleted

## 2023-09-18 ENCOUNTER — Other Ambulatory Visit (HOSPITAL_COMMUNITY): Payer: Self-pay

## 2023-09-18 MED ORDER — OXYCODONE HCL 10 MG PO TABS
10.0000 mg | ORAL_TABLET | Freq: Three times a day (TID) | ORAL | 0 refills | Status: DC | PRN
  Filled 2023-09-18 – 2023-09-24 (×2): qty 180, 30d supply, fill #0

## 2023-09-19 ENCOUNTER — Other Ambulatory Visit (HOSPITAL_COMMUNITY): Payer: Self-pay

## 2023-09-23 ENCOUNTER — Other Ambulatory Visit: Payer: Self-pay

## 2023-09-24 ENCOUNTER — Other Ambulatory Visit: Payer: Self-pay

## 2023-09-24 ENCOUNTER — Other Ambulatory Visit (HOSPITAL_COMMUNITY): Payer: Self-pay

## 2023-09-27 ENCOUNTER — Other Ambulatory Visit: Payer: Self-pay | Admitting: Family Medicine

## 2023-09-29 ENCOUNTER — Ambulatory Visit: Payer: Medicare (Managed Care) | Admitting: "Endocrinology

## 2023-09-29 ENCOUNTER — Encounter: Payer: Self-pay | Admitting: "Endocrinology

## 2023-09-29 VITALS — BP 142/84 | HR 92 | Ht 68.0 in | Wt >= 6400 oz

## 2023-09-29 DIAGNOSIS — D352 Benign neoplasm of pituitary gland: Secondary | ICD-10-CM

## 2023-09-29 DIAGNOSIS — E291 Testicular hypofunction: Secondary | ICD-10-CM | POA: Diagnosis not present

## 2023-09-29 DIAGNOSIS — E221 Hyperprolactinemia: Secondary | ICD-10-CM | POA: Diagnosis not present

## 2023-09-29 MED ORDER — CABERGOLINE 0.5 MG PO TABS: 0.2500 mg | ORAL_TABLET | ORAL | 1 refills | Status: DC

## 2023-09-29 NOTE — Telephone Encounter (Signed)
 Requested medications are due for refill today.  yes  Requested medications are on the active medications list.  yes  Last refill. 08/28/2023 #30 0 rf  Future visit scheduled.   In 2026  Notes to clinic.  Pt was seen 3 months ago - but has not been seen for chronic conditions in a while. Please review for refill.    Requested Prescriptions  Pending Prescriptions Disp Refills   prazosin  (MINIPRESS ) 2 MG capsule [Pharmacy Med Name: PRAZOSIN  2MG  CAPSULES] 30 capsule 0    Sig: TAKE 1 CAPSULE(2 MG) BY MOUTH AT BEDTIME     Cardiovascular:  Alpha Blockers Failed - 09/29/2023 11:19 AM      Failed - Last BP in normal range    BP Readings from Last 1 Encounters:  09/29/23 (!) 142/84         Passed - Valid encounter within last 6 months    Recent Outpatient Visits           3 months ago Tendinopathy of rotator cuff, right   [place] [facility] Pickard, Butler DASEN, MD   6 months ago Acute pain of right shoulder   [place] [facility] Duanne Butler DASEN, MD   6 months ago Fever, unspecified fever cause   [place] [facility] Duanne Butler DASEN, MD   7 months ago Type 2 diabetes mellitus with hyperglycemia, without long-term current use of insulin  [facility])   [place] [facility] Duanne Butler DASEN, MD   1 year ago Type 2 diabetes mellitus with hyperglycemia, without long-term current use of insulin  [facility])   [place] [facility] Pickard, Butler DASEN, MD

## 2023-09-29 NOTE — Patient Instructions (Addendum)
                                       Advice for Weight Management  -For most of us  the best way to lose weight is by diet management. Generally speaking, diet management means consuming less calories intentionally which over time brings about progressive weight loss.  This can be achieved more effectively by avoiding ultra processed carbohydrates, processed meats, unhealthy fats.    It is critically important to know your numbers: how much calorie you are consuming and how much calorie you need. More importantly, our carbohydrates sources should be unprocessed naturally occurring  complex starch food items.  It is always important to balance nutrition also by  appropriate intake of proteins (mainly plant-based), healthy fats/oils, plenty of fruits and vegetables.   -The American College of Lifestyle Medicine (ACL M) recommends nutrition derived mostly from Whole Food, Plant Predominant Sources example an apple instead of applesauce or apple pie. Eat Plenty of vegetables, Mushrooms, fruits, Legumes, Whole Grains, Nuts, seeds in lieu of processed meats, processed snacks/pastries red meat, poultry, eggs.  Use only water or unsweetened tea for hydration.  The College also recommends the need to stay away from risky substances including alcohol, smoking; obtaining 7-9 hours of restorative sleep, at least 150 minutes of moderate intensity exercise weekly, importance of healthy social connections, and being mindful of stress and seek help when it is overwhelming.    -Sticking to a routine mealtime to eat 3 meals a day and avoiding unnecessary snacks is shown to have a big role in weight control. Under normal circumstances, the only time we burn stored energy is when we are hungry, so allow  some hunger to take place- hunger means no food between appropriate meal times, only water.  It is not advisable to starve.   -It is better to avoid simple carbohydrates including:  Cakes, Sweet Desserts, Ice Cream, Soda (diet and regular), Sweet Tea, Candies, Chips, Cookies, Store Bought Juices, Alcohol in Excess of  1-2 drinks a day, Lemonade,  Artificial Sweeteners, Doughnuts, Coffee Creamers, Sugar-free Products, etc, etc.  This is not a complete list...SABRA.    -Consulting with certified diabetes educators is proven to provide you with the most accurate and current information on diet.  Also, you may be  interested in discussing diet options/exchanges , we can schedule a visit with Penny Crumpton, RDN, CDE for individualized nutrition education.  -Exercise: If you are able: 30 -60 minutes a day ,4 days a week, or 150 minutes of moderate intensity exercise weekly.    The longer the better if tolerated.  Combine stretch, strength, and aerobic activities.  If you were told in the past that you have high risk for cardiovascular diseases, or if you are currently symptomatic, you may seek evaluation by your heart doctor prior to initiating moderate to intense exercise programs.

## 2023-09-29 NOTE — Progress Notes (Signed)
 Endocrinology follow-up note                                             09/29/2023, 9:55 AM   Subjective:    Patient ID: Ricardo FORBES Joshua Mickey., male    DOB: 1975-11-28, PCP Duanne Butler DASEN, MD   Past Medical History:  Diagnosis Date   Anemia    Anxiety and depression    B12 deficiency    Back pain    COPD (chronic obstructive pulmonary disease) (HCC)    COVID    9/21- hypoxia requiring oxygen   Diabetes mellitus without complication (HCC)    Frequency of urination    GERD (gastroesophageal reflux disease)    Gluteal abscess    ecoli after covid hospitalization 2021   Headache    Hypercholesterolemia    Hypertension    Obesity    Oxygen dependent    o2@ 2L   Scar tissue    Scar Tissue on lungs due to Covid   Sleep apnea    Stomach ulcer    Tachycardia    Thoracic aortic aneurysm [facility])    Past Surgical History:  Procedure Laterality Date   BIOPSY  05/17/2021   Procedure: BIOPSY;  Surgeon: Teressa Toribio SQUIBB, MD;  Location: [facility];  Service: Gastroenterology;;  EGD and COLON   BIOPSY  09/16/2022   Procedure: BIOPSY;  Surgeon: Cindie Carlin POUR, DO;  [facility];  Service: Endoscopy;;   COLONOSCOPY WITH PROPOFOL  N/A 05/17/2021   Surgeon: Teressa Toribio SQUIBB, MD; mild erythema and granularity of the colonic mucosa with benign biopsies, internal hemorrhoids suspected to be etiology of rectal bleeding.   COLONOSCOPY WITH PROPOFOL  N/A 09/16/2022   Procedure: COLONOSCOPY WITH PROPOFOL ;  Surgeon: Cindie Carlin POUR, DO;  [facility];  Service: Endoscopy;  Laterality: N/A;  10:15 am, asa 3   ESOPHAGOGASTRODUODENOSCOPY (EGD) WITH PROPOFOL  N/A 05/17/2021   Surgeon: Teressa Toribio SQUIBB, MD; very mild, nonspecific distal gastritis s/p biopsied, otherwise normal exam.  Gastric biopsy was benign, negative for H. pylori.   ESOPHAGOGASTRODUODENOSCOPY (EGD) WITH PROPOFOL  N/A 04/15/2022   Procedure: ESOPHAGOGASTRODUODENOSCOPY (EGD) WITH PROPOFOL ;  Surgeon: Cindie Carlin POUR, DO;  [facility];  Service: Endoscopy;  Laterality: N/A;   FLEXIBLE SIGMOIDOSCOPY N/A 04/15/2022   Procedure: FLEXIBLE SIGMOIDOSCOPY;  Surgeon: Cindie Carlin POUR, DO;  [facility];  Service: Endoscopy;  Laterality: N/A;  100pm, asa 3   GIVENS CAPSULE STUDY N/A 11/16/2021   Surgeon: Cindie Carlin POUR, DO; poor and only able to identify first gastric image.   GIVENS CAPSULE STUDY N/A 04/15/2022   Procedure: GIVENS CAPSULE STUDY;  Surgeon: Cindie Carlin POUR, DO;  [facility];  Service: Endoscopy;  Laterality: N/A;   Social History   Socioeconomic History   Marital status: Married    Spouse name: Not on file   Number of children: Not on file   Years of education: Not on file   Highest education level: Not on file  Occupational History   Occupation: stay at home spouse  Tobacco Use   Smoking status: Never   Smokeless tobacco: Never  Vaping Use   Vaping status: Never Used  Substance and Sexual Activity   Alcohol use: Not Currently    Comment: occasional   Drug use: Not Currently    Types: Marijuana  Comment: occasionally   Sexual activity: Yes  Other Topics Concern   Not on file  Social History Narrative   Lives at home with wife and two children.  Four children in all.     Social Drivers of Corporate investment banker Strain: Low Risk  (07/02/2023)   Overall Financial Resource Strain (CARDIA)    Difficulty of Paying Living Expenses: Not hard at all  Food Insecurity: No Food Insecurity (07/02/2023)   Hunger Vital Sign    Worried About Running Out of Food in the Last Year: Never true    Ran Out of Food in the Last Year: Never true  Transportation Needs: No Transportation Needs (07/02/2023)   PRAPARE - Administrator, Civil Service (Medical): No    Lack of Transportation (Non-Medical): No  Physical Activity: Inactive (07/02/2023)   Exercise Vital Sign    Days of Exercise per Week: 0 days    Minutes of Exercise per  Session: 0 min  Stress: No Stress Concern Present (07/02/2023)   [employer] of Occupational Health - Occupational Stress Questionnaire    Feeling of Stress : Only a little  Social Connections: Moderately Integrated (07/02/2023)   Social Connection and Isolation Panel    Frequency of Communication with Friends and Family: More than three times a week    Frequency of Social Gatherings with Friends and Family: Three times a week    Attends Religious Services: More than 4 times per year    Active Member of Clubs or Organizations: No    Attends Banker Meetings: Never    Marital Status: Married   Family History  Problem Relation Age of Onset   Diabetes Mother    Hypertension Mother    Hyperlipidemia Mother    Thyroid disease Mother    Diabetes Father    Hyperlipidemia Father    Hypertension Father    Depression Father    Anxiety disorder Father    Sleep apnea Father    Colon cancer Neg Hx    Stomach cancer Neg Hx    Outpatient Encounter Medications as of 09/29/2023  Medication Sig   Accu-Chek Softclix Lancets lancets Use to check blood sugar fasting, daily.   acetaminophen  (TYLENOL ) 325 MG tablet Take 650 mg by mouth every 6 (six) hours as needed for moderate pain.   albuterol  (VENTOLIN  HFA) 108 (90 Base) MCG/ACT inhaler INHALE 2 PUFFS INTO THE LUNGS EVERY 4 HOURS AS NEEDED FOR WHEEZING OR SHORTNESS OF BREATH   ascorbic acid  (VITAMIN C) 500 MG tablet Take 1 tablet (500 mg total) by mouth daily.   atorvastatin  (LIPITOR) 10 MG tablet TAKE 1 TABLET(10 MG) BY MOUTH DAILY   Blood Glucose Monitoring Suppl (ONE TOUCH ULTRA 2) w/Device KIT 1 each by Does not apply route 3 (three) times daily.   busPIRone  (BUSPAR ) 7.5 MG tablet TAKE 1 TABLET(7.5 MG) BY MOUTH TWICE DAILY   cabergoline  (DOSTINEX ) 0.5 MG tablet Take 0.5 tablets (0.25 mg total) by mouth 2 (two) times a week.   cetirizine  (ZYRTEC ) 10 MG tablet TAKE 1 TABLET(10 MG) BY MOUTH DAILY   Cholecalciferol (VITAMIN D ) 50  MCG (2000 UT) tablet Take 2,000 Units by mouth daily.   ciclopirox  (PENLAC ) 8 % solution Apply topically at bedtime. Apply over nail and surrounding skin. Apply daily over previous coat. After seven (7) days, may remove with alcohol and continue cycle.   clotrimazole -betamethasone  (LOTRISONE ) cream APPLY TOPICALLY TO THE AFFECTED AREA TWICE DAILY (Patient taking differently: Apply 1  Application topically 2 (two) times daily as needed (irritation).)   famotidine  (PEPCID ) 40 MG tablet Take 1 tablet (40 mg total) by mouth daily.   fluticasone  (FLONASE ) 50 MCG/ACT nasal spray Place 2 sprays into both nostrils daily as needed for allergies.   glucose blood test strip Use to check fasting blood sugar, daily.   hydrocortisone  (ANUSOL -HC) 25 MG suppository Place 1 suppository (25 mg total) rectally 2 (two) times daily.   Lancets MISC Check fbs daily   metoprolol  succinate (TOPROL -XL) 100 MG 24 hr tablet TAKE 1 TABLET(100 MG) BY MOUTH DAILY   Multiple Vitamin (MULTIVITAMIN) tablet Take 1 tablet by mouth daily.   naloxone  (NARCAN ) nasal spray 4 mg/0.1 mL Place 1 spray into the nose as needed IF FOUND UNRESPONSIVE AND CALL 911 IMMEDIATELY   ofloxacin  (OCUFLOX ) 0.3 % ophthalmic solution Place 1 drop into eye(s) 4 (four) times daily as needed for eye infection.   omeprazole  (PRILOSEC) 40 MG capsule Take 1 capsule (40 mg total) by mouth daily. 30 minutes before breakfast   Oxycodone  HCl 10 MG TABS Take 1-2 tablets (10-20 mg total) by mouth 3 (three) times daily as needed.   Oxycodone  HCl 10 MG TABS Take 1-2 tablets (10-20 mg total) by mouth 3 (three) times daily as needed.   Oxycodone  HCl 10 MG TABS Take 1-2 tablets (10-20 mg total) by mouth 3 (three) times daily as needed.   Oxycodone  HCl 10 MG TABS Take 1-2 tablets (10-20 mg total) by mouth 3 (three) times daily as needed   Oxycodone  HCl 10 MG TABS Take 1-2 tablets (10-20 mg total) by mouth 3 (three) times daily as needed.   Oxycodone  HCl 10 MG TABS Take 1-2  tablets (10-20 mg total) by mouth 3 (three) times daily as needed.   Oxycodone  HCl 10 MG TABS Take 1-2 tablets (10-20 mg total) by mouth 3 (three) times daily as needed.   prazosin  (MINIPRESS ) 2 MG capsule TAKE 1 CAPSULE(2 MG) BY MOUTH AT BEDTIME   sodium chloride  (OCEAN) 0.65 % SOLN nasal spray Place 1 spray into both nostrils as needed for congestion.   sulfamethoxazole -trimethoprim  (BACTRIM  DS) 800-160 MG tablet Take 1 tablet by mouth 2 (two) times daily.   tirzepatide  (MOUNJARO ) 15 MG/0.5ML Pen Inject 15 mg into the skin once a week.   venlafaxine  XR (EFFEXOR -XR) 75 MG 24 hr capsule TAKE 3 CAPSULES(225 MG) BY MOUTH DAILY WITH BREAKFAST   zolpidem  (AMBIEN ) 10 MG tablet TAKE 1 TABLET(10 MG) BY MOUTH AT BEDTIME AS NEEDED FOR SLEEP   [DISCONTINUED] cabergoline  (DOSTINEX ) 0.5 MG tablet Take 0.5 tablets (0.25 mg total) by mouth 2 (two) times a week.   No facility-administered encounter medications on file as of 09/29/2023.   ALLERGIES: No Known Allergies  VACCINATION STATUS: Immunization History  Administered Date(s) Administered   Influenza,inj,Quad PF,6+ Mos 11/23/2019   Influenza-Unspecified 10/08/2021    HPI Kypton Eltringham. is 48 y.o. male who presents today with a medical history as above. he is being seen in follow-up after he was seen in consultation for  pituitary macroadenoma requested by Duanne Butler DASEN, MD.  He is accompanied by his wife to clinic.   He was put on cabergoline  0.25 mg twice a week to treat hyperprolactinemia to which he seems to have responded with significant improvement in his prolactin level as well as his testosterone  level. Patient reports overweight/obesity for the majority of his adulthood.  He fathered 2 children.  He has always known about having only 1 testicle. Due  to ongoing fatigue he underwent testosterone  measurement in February 2025 when he was found to have profound hypogonadism but total testosterone  of 29.  Subsequently, he was offered more  endocrine workup which showed FSH of 20.1, elevated, prolactin of 21.6, elevated.  His testosterone  remains low at 41.  After treatment since May 2025 with cabergoline , his prolactin has improved to 4.7 and testosterone  improved to 175 ng per DL.  He underwent MRI of brain with and without contrast on March 30, 2023 which showed the pituitary gland to be diffusely enlarged and mildly heterogeneous measuring 1.3 x 2.0 x 1.4 cm. There was a convex superior margin with the  gland extending into the suprasellar cistern without significant mass effect on the optic nerves or chiasm.  Patient with medical history of CHF, sleep apnea, thoracic aortic aneurysm, as well as complicated COVID which rendered him on oxygen through nasal cannula continuously.  His current flow is 2 L/m.  He presents with 20 pounds of weight loss, on Mounjaro  15 mg subcutaneously weekly.  He does not have acute complaints today.  His other medical problems include type 2 diabetes , hyperlipidemia on atorvastatin  10 mg p.o. nightly.  He also has medical history of hypertension on management with metoprolol  100 mg p.o. daily.  His other medications include oxycodone  10 mg 6 times a day, albuterol , buspirone , omeprazole , prazosin , cetirizine , famotidine , zolpidem . His recent labs show hyperlipidemia associated with thyroid macroadenoma. He denies galactorrhea, nor retro-orbital headache.  Review of Systems  Constitutional: +mildly fluctuating body weight,  no fatigue, no subjective hyperthermia, no subjective hypothermia, + on ongoing oxygen supplement through nasal cannula Eyes: no blurry vision, no xerophthalmia ENT: no sore throat, no nodules palpated in throat, no dysphagia/odynophagia, no hoarseness Cardiovascular: no Chest Pain, no Shortness of Breath, no palpitations, no leg swelling Respiratory: no cough, no shortness of breath Gastrointestinal: no Nausea/Vomiting/Diarhhea Musculoskeletal: no muscle/joint aches Skin: no  rashes Neurological: no tremors, no numbness, no tingling, no dizziness Psychiatric: no depression, no anxiety  Objective:       09/29/2023    9:29 AM 07/02/2023    8:48 AM 06/03/2023    8:43 AM  Vitals with BMI  Height 5' 8 5' 8 5' 8  Weight 410 lbs 3 oz 428 lbs 428 lbs  BMI 62.38 65.09 65.09  Systolic 142 -- 126  Diastolic 84 -- 82  Pulse 92  89    BP (!) 142/84   Pulse 92   Ht 5' 8 (1.727 m)   Wt (!) 410 lb 3.2 oz (186.1 kg)   BMI 62.37 kg/m   Wt Readings from Last 3 Encounters:  09/29/23 (!) 410 lb 3.2 oz (186.1 kg)  07/02/23 (!) 428 lb (194.1 kg)  06/03/23 (!) 428 lb (194.1 kg)    Physical Exam  Constitutional:  Body mass index is 62.37 kg/m.,  not in acute distress, normal state of mind, + on portable oxygen canister. Eyes: PERRLA, EOMI, no exophthalmos ENT: moist mucous membranes, no gross thyromegaly, no gross cervical lymphadenopathy  Genital exam: He has soft-firm right testicle measuring 12 cc, absent left testicle.  No intrascrotal mass lesion, large thighs bilaterally. Neurological: no tremor with outstretched hands, Deep tendon reflexes normal in bilateral lower extremities.  CMP ( most recent) CMP     Component Value Date/Time   NA 142 06/02/2023 1513   NA 141 05/18/2020 1357   K 4.1 06/02/2023 1513   CL 104 06/02/2023 1513   CO2 30 06/02/2023 1513   GLUCOSE  111 (H) 06/02/2023 1513   BUN 12 06/02/2023 1513   BUN 13 05/18/2020 1357   CREATININE 1.07 06/02/2023 1513   CREATININE 1.10 02/25/2023 1216   CALCIUM  9.3 06/02/2023 1513   PROT 7.5 06/02/2023 1513   PROT 7.4 05/18/2020 1357   ALBUMIN 3.5 06/02/2023 1513   ALBUMIN 4.3 05/18/2020 1357   AST 18 06/02/2023 1513   ALT 16 06/02/2023 1513   ALKPHOS 69 06/02/2023 1513   BILITOT 0.3 06/02/2023 1513   BILITOT <0.2 05/18/2020 1357   GFR 84.91 03/21/2021 1158   EGFR 83 02/25/2023 1216   EGFR 98 05/18/2020 1357   GFRNONAA >60 06/02/2023 1513   GFRNONAA 95 12/31/2019 1446     Diabetic  Labs (most recent): Lab Results  Component Value Date   HGBA1C 6.0 (H) 02/25/2023   HGBA1C 6.1 (H) 07/02/2022   HGBA1C 6.3 (H) 10/18/2021     Lipid Panel ( most recent) Lipid Panel     Component Value Date/Time   CHOL 165 02/27/2023 1052   CHOL 221 (H) 05/18/2020 1357   TRIG 148 02/27/2023 1052   HDL 44 02/27/2023 1052   HDL 53 05/18/2020 1357   CHOLHDL 3.8 02/27/2023 1052   LDLCALC 96 02/27/2023 1052   LABVLDL 23 05/18/2020 1357      Lab Results  Component Value Date   TSH 4.680 (H) 05/18/2020   TSH 5.270 (H) 01/28/2020          Latest Reference Range & Units 02/25/23 12:16 02/27/23 09:34 06/02/23 15:13 09/24/23 07:40  LH 1.5 - 9.3 mIU/mL  7.1    FSH 1.4 - 12.8 mIU/mL  20.1 (H)    Prolactin 3.9 - 22.7 ng/mL  21.6 (H)  4.7  eAG (mmol/L) mmol/L 7.0     Glucose 70 - 99 mg/dL 91  888 (H)   Hemoglobin A1C <5.7 % of total Hgb 6.0 (H)     Sex Horm Binding Glob, Serum 16.5 - 55.9 nmol/L 38   39.3  Testosterone  264 - 916 ng/dL 29 (L) 41 (L)  824 (L)  Testosterone , Bioavailable 110.0 - 575.0 ng/dL Pend     Testosterone  Free 46.0 - 224.0 pg/mL See below   WILL FOLLOW (P)   Assessment & Plan:   1. Pituitary macroadenoma (HCC) (Primary) 2. Hyperprolactinemia (HCC) 3. Hypogonadism, male 4.  Morbid obesity   - Ricardo FORBES Joshua Mickey.  is being seen at a kind request of Duanne Butler DASEN, MD. - I have reviewed his available endocrine records and clinically evaluated the patient. - Based on these reviews, he has pituitary macroadenoma, type 2 diabetes, hyperprolactinemia, hypogonadism, morbid obesity among other major medical problems including CHF, sleep apnea, oxygen dependent respiratory failure. His hypogonadism is secondary and of multiple etiology including opioids, hyperprolactinemia, history macroadenoma. He is responding to cabergoline  from the point of view of prolactin and testosterone . His total testosterone  has improved to 175 ng per DL while he is prolactin has  improved to 4.7 from 21.6. He remains a good candidate for dopamine receptor agonist, cabergoline  0.25 mg p.o. twice a week. - Prolactin - Testosterone , Free, Total, SHBG - PSA    He will need better assessment for the pituitary macroadenoma.  His next labs will include IGF-I, alpha subunit, repeat measurement of gonadotropins along with his prolactin.  He will need a follow-up MRI 1 year from the last 1 to assess treatment effect.    He will continue to benefit from GLP-1 receptor agonist, advised to continue Mounjaro  15 mg  subcutaneously weekly.  - he is advised to maintain close follow up with Duanne Butler DASEN, MD for primary care needs.    I spent  26  minutes in the care of the patient today including review of labs from Thyroid Function, CMP, and other relevant labs ; imaging/biopsy records (current and previous including abstractions from other facilities); face-to-face time discussing  his lab results and symptoms, medications doses, his options of short and long term treatment based on the latest standards of care / guidelines;   and documenting the encounter.  Ricardo FORBES Joshua Mickey.  participated in the discussions, expressed understanding, and voiced agreement with the above plans.  All questions were answered to his satisfaction. he is encouraged to contact clinic should he have any questions or concerns prior to his return visit.   Follow up plan: Return in about 3 months (around 12/29/2023) for Fasting Labs  in AM B4 8, A1c -NV.   Ranny Earl, MD [facility] [address] [place], [place] [address] Phone: [phone]  Fax: [phone]     09/29/2023, 9:55 AM  This note was partially dictated with voice recognition software. Similar sounding words can be transcribed inadequately or may not  be corrected upon review.

## 2023-09-30 LAB — CBC WITH DIFFERENTIAL/PLATELET
Basophils Absolute: 0.1 x10E3/uL (ref 0.0–0.2)
Basos: 1 %
EOS (ABSOLUTE): 0.2 x10E3/uL (ref 0.0–0.4)
Eos: 2 %
Hematocrit: 37.6 % (ref 37.5–51.0)
Hemoglobin: 11 g/dL — ABNORMAL LOW (ref 13.0–17.7)
Immature Grans (Abs): 0 x10E3/uL (ref 0.0–0.1)
Immature Granulocytes: 0 %
Lymphocytes Absolute: 3.8 x10E3/uL — ABNORMAL HIGH (ref 0.7–3.1)
Lymphs: 38 %
MCH: 20.8 pg — ABNORMAL LOW (ref 26.6–33.0)
MCHC: 29.3 g/dL — ABNORMAL LOW (ref 31.5–35.7)
MCV: 71 fL — ABNORMAL LOW (ref 79–97)
Monocytes Absolute: 0.7 x10E3/uL (ref 0.1–0.9)
Monocytes: 7 %
Neutrophils Absolute: 5.3 x10E3/uL (ref 1.4–7.0)
Neutrophils: 52 %
Platelets: 273 x10E3/uL (ref 150–450)
RBC: 5.29 x10E6/uL (ref 4.14–5.80)
RDW: 15.7 % — ABNORMAL HIGH (ref 11.6–15.4)
WBC: 10 x10E3/uL (ref 3.4–10.8)

## 2023-09-30 LAB — TESTOSTERONE, FREE, TOTAL, SHBG
Sex Hormone Binding: 39.3 nmol/L (ref 16.5–55.9)
Testosterone, Free: 3.4 pg/mL — ABNORMAL LOW (ref 6.8–21.5)
Testosterone: 175 ng/dL — ABNORMAL LOW (ref 264–916)

## 2023-09-30 LAB — PSA: Prostate Specific Ag, Serum: 0.4 ng/mL (ref 0.0–4.0)

## 2023-09-30 LAB — PROLACTIN: Prolactin: 4.7 ng/mL (ref 3.9–22.7)

## 2023-10-02 ENCOUNTER — Other Ambulatory Visit: Payer: Self-pay | Admitting: Cardiology

## 2023-10-03 ENCOUNTER — Other Ambulatory Visit: Payer: Self-pay | Admitting: Cardiology

## 2023-10-06 ENCOUNTER — Other Ambulatory Visit: Payer: Self-pay | Admitting: Family Medicine

## 2023-10-22 ENCOUNTER — Other Ambulatory Visit (HOSPITAL_COMMUNITY): Payer: Self-pay

## 2023-10-22 MED ORDER — OXYCODONE HCL 10 MG PO TABS
10.0000 mg | ORAL_TABLET | Freq: Three times a day (TID) | ORAL | 0 refills | Status: DC | PRN
  Filled 2023-10-24: qty 180, 30d supply, fill #0

## 2023-10-24 ENCOUNTER — Other Ambulatory Visit: Payer: Self-pay

## 2023-10-24 ENCOUNTER — Other Ambulatory Visit (HOSPITAL_COMMUNITY): Payer: Self-pay

## 2023-10-25 ENCOUNTER — Other Ambulatory Visit (HOSPITAL_BASED_OUTPATIENT_CLINIC_OR_DEPARTMENT_OTHER): Payer: Self-pay

## 2023-10-27 ENCOUNTER — Other Ambulatory Visit (HOSPITAL_BASED_OUTPATIENT_CLINIC_OR_DEPARTMENT_OTHER): Payer: Self-pay

## 2023-10-29 ENCOUNTER — Other Ambulatory Visit: Payer: Self-pay | Admitting: Family Medicine

## 2023-11-02 ENCOUNTER — Other Ambulatory Visit: Payer: Self-pay | Admitting: Cardiology

## 2023-11-05 ENCOUNTER — Other Ambulatory Visit (HOSPITAL_BASED_OUTPATIENT_CLINIC_OR_DEPARTMENT_OTHER): Payer: Self-pay

## 2023-11-10 ENCOUNTER — Other Ambulatory Visit: Payer: Self-pay | Admitting: Family Medicine

## 2023-11-19 ENCOUNTER — Other Ambulatory Visit: Payer: Self-pay | Admitting: Cardiology

## 2023-11-24 ENCOUNTER — Other Ambulatory Visit: Payer: Self-pay

## 2023-11-24 ENCOUNTER — Other Ambulatory Visit (HOSPITAL_BASED_OUTPATIENT_CLINIC_OR_DEPARTMENT_OTHER): Payer: Self-pay

## 2023-11-24 ENCOUNTER — Other Ambulatory Visit (HOSPITAL_COMMUNITY): Payer: Self-pay

## 2023-11-24 MED ORDER — OXYCODONE HCL 10 MG PO TABS
10.0000 mg | ORAL_TABLET | Freq: Three times a day (TID) | ORAL | 0 refills | Status: DC | PRN
  Filled 2023-11-24 (×2): qty 180, 30d supply, fill #0

## 2023-11-27 ENCOUNTER — Other Ambulatory Visit: Payer: Self-pay | Admitting: Family Medicine

## 2023-12-02 ENCOUNTER — Other Ambulatory Visit: Payer: Self-pay

## 2023-12-02 DIAGNOSIS — D563 Thalassemia minor: Secondary | ICD-10-CM

## 2023-12-02 DIAGNOSIS — D509 Iron deficiency anemia, unspecified: Secondary | ICD-10-CM

## 2023-12-02 DIAGNOSIS — R7689 Other specified abnormal immunological findings in serum: Secondary | ICD-10-CM

## 2023-12-02 DIAGNOSIS — D573 Sickle-cell trait: Secondary | ICD-10-CM

## 2023-12-02 DIAGNOSIS — D500 Iron deficiency anemia secondary to blood loss (chronic): Secondary | ICD-10-CM

## 2023-12-03 ENCOUNTER — Inpatient Hospital Stay: Payer: Medicare (Managed Care) | Attending: Physician Assistant

## 2023-12-03 DIAGNOSIS — R7689 Other specified abnormal immunological findings in serum: Secondary | ICD-10-CM

## 2023-12-03 DIAGNOSIS — D573 Sickle-cell trait: Secondary | ICD-10-CM | POA: Insufficient documentation

## 2023-12-03 DIAGNOSIS — D500 Iron deficiency anemia secondary to blood loss (chronic): Secondary | ICD-10-CM

## 2023-12-03 DIAGNOSIS — D509 Iron deficiency anemia, unspecified: Secondary | ICD-10-CM | POA: Insufficient documentation

## 2023-12-03 DIAGNOSIS — K921 Melena: Secondary | ICD-10-CM | POA: Insufficient documentation

## 2023-12-03 DIAGNOSIS — D563 Thalassemia minor: Secondary | ICD-10-CM

## 2023-12-03 LAB — COMPREHENSIVE METABOLIC PANEL WITH GFR
ALT: 17 U/L (ref 0–44)
AST: 23 U/L (ref 15–41)
Albumin: 4.2 g/dL (ref 3.5–5.0)
Alkaline Phosphatase: 92 U/L (ref 38–126)
Anion gap: 10 (ref 5–15)
BUN: 8 mg/dL (ref 6–20)
CO2: 30 mmol/L (ref 22–32)
Calcium: 9.4 mg/dL (ref 8.9–10.3)
Chloride: 103 mmol/L (ref 98–111)
Creatinine, Ser: 1.05 mg/dL (ref 0.61–1.24)
GFR, Estimated: >60 mL/min (ref >60)
Glucose, Bld: 114 mg/dL — ABNORMAL HIGH (ref 70–99)
Potassium: 4.5 mmol/L (ref 3.5–5.1)
Sodium: 144 mmol/L (ref 135–145)
Total Bilirubin: 0.3 mg/dL (ref 0.0–1.2)
Total Protein: 7.6 g/dL (ref 6.5–8.1)

## 2023-12-03 LAB — CBC WITH DIFFERENTIAL/PLATELET
Abs Immature Granulocytes: 0.01 K/uL (ref 0.00–0.07)
Basophils Absolute: 0.1 K/uL (ref 0.0–0.1)
Basophils Relative: 1 %
Eosinophils Absolute: 0.2 K/uL (ref 0.0–0.5)
Eosinophils Relative: 3 %
HCT: 35.2 % — ABNORMAL LOW (ref 39.0–52.0)
Hemoglobin: 10.1 g/dL — ABNORMAL LOW (ref 13.0–17.0)
Immature Granulocytes: 0 %
Lymphocytes Relative: 29 %
Lymphs Abs: 2.6 K/uL (ref 0.7–4.0)
MCH: 20.6 pg — ABNORMAL LOW (ref 26.0–34.0)
MCHC: 28.7 g/dL — ABNORMAL LOW (ref 30.0–36.0)
MCV: 71.8 fL — ABNORMAL LOW (ref 80.0–100.0)
Monocytes Absolute: 0.6 K/uL (ref 0.1–1.0)
Monocytes Relative: 7 %
Neutro Abs: 5.3 K/uL (ref 1.7–7.7)
Neutrophils Relative %: 60 %
Platelets: 217 K/uL (ref 150–400)
RBC: 4.9 MIL/uL (ref 4.22–5.81)
RDW: 15.3 % (ref 11.5–15.5)
WBC: 8.8 K/uL (ref 4.0–10.5)
nRBC: 0 % (ref 0.0–0.2)

## 2023-12-03 LAB — IRON AND TIBC
Iron: 40 ug/dL — ABNORMAL LOW (ref 45–182)
Saturation Ratios: 14 % — ABNORMAL LOW (ref 17.9–39.5)
TIBC: 286 ug/dL (ref 250–450)
UIBC: 246 ug/dL

## 2023-12-03 LAB — FERRITIN: Ferritin: 254 ng/mL (ref 24–336)

## 2023-12-09 ENCOUNTER — Other Ambulatory Visit: Payer: Self-pay | Admitting: Family Medicine

## 2023-12-09 DIAGNOSIS — R0982 Postnasal drip: Secondary | ICD-10-CM

## 2023-12-09 NOTE — Progress Notes (Unsigned)
 [facility] [address], [place] [address]   CLINIC:  Medical Oncology/Hematology  PCP:  Duanne Butler DASEN, MD [address] [place] [address] [phone]   REASON FOR VISIT: Microcytic anemia  INTERVAL HISTORY:   Ricardo Lopez 48 y.o. male returns for routine follow-up of microcytic anemia.  He was last seen by Pleasant Barefoot PA-C on 12/02/2022.     Patient denies any acute changes in his health, but reports that he has good days and bad days, as he continues to cope with multiple chronic comorbidities, particularly COVID induced lung injury and oxygen dependence.*** ***  He reports that his energy waxes and wanes, but overall he remains moderately fatigued.   ***Ice pica has resolved.   ***He continues to take iron tablet daily, but this is causing some constipation.  *** Every other day?  *** ***He denies any symptoms of pain crises.   ***He continues to have intermittent hematochezia (2-3 x weekly), which was initially thought to be from hemorrhoids but has persisted despite banding.   ***He has ***% energy and low*** appetite.   His weight has been stable since his last visit.***  ASSESSMENT & PLAN:  1.   Microcytic anemia, sickle cell trait + alpha thalassemia trait - He reports that he has been anemic all his life.  No prior history of blood transfusion. - Personal history of sickle cell trait. - EGD (04/15/2022): Gastritis, otherwise normal esophagus and duodenum. - Flexible sigmoidoscopy (04/15/2022): Internal hemorrhoids.  He is being considered for referral to colorectal surgery as he has not responded to conservative measures / hemorrhoid banding. - Hematology workup (04/02/2022): Hemoglobin fractionation cascade/hemoglobin solubility confirms sickle cell trait, but with Hgb-S observed at lower concentration than expected, which suggests possible sickle cell trait compounded by alpha thalassemia, or sickle cell trait with iron  deficiency anemia. Normal copper , folate, B12, MMA. Iron panel (03/22/2022) with ferritin 153, iron saturation 17% Negative DAT.  Reticulocytes 1.2%.  Normal LDH. Mildly elevated kappa free light chains 27.6, normal lambda 16.8, normal FLC ratio 1.64.  SPEP and immunofixation normal. Alpha thalassemia DNA analysis consistent with carrier of alpha thalassemia single gene deviation - Received IV Feraheme x 1 on 04/29/2022. - Most recent labs (12/03/2023): Hgb 10.1/MCV 71.8 (more or less at baseline Hgb 9.5-10.5).  Ferritin 254, iron saturation 14%.  Normal creatinine 1.05/GFR >60. - Persistent hematochezia, which was initially thought to be from hemorrhoids but has persisted despite banding.*** - Ice pica resolved.  Moderate persistent fatigue.*** - He denies any symptoms of pain crises.*** - He is taking iron tablet daily for the past 3 years, but this is causing constipation*** every other day?  *** - Microcytic anemia primarily from sickle cell trait, alpha thalassemia trait, and iron deficiency related to rectal bleeding  - Discussed with patient that patients with sickle cell trait are at increased risk of chronic kidney disease. 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  Left kidney is normal in location, no suspicious masses identified.  No evidence of hydronephrosis. - PLAN: Recommend switching to every other day iron for better tolerance.***Can discontinue iron supplement at this time.  *** - We will recheck CBC/D, CMP, iron panel in 6 months *** 1 year vs. discharge to PCP*** - If any significant worsening of creatinine in the future, we will refer to  nephrology for further management.*** - No evidence of plasma cell dyscrasia based on repeat light chain/immunofixation/SPEP.  No further testing at this time.  ***    2.  Social/family history: - He is currently on disability since long COVID (initial diagnosis 09/28/2019), which left him on continuous oxygen via nasal cannula.  He worked as a architect prior to disability.  Non-smoker. - Mother has anemia.  Maternal grandmother had sickle cell trait.  Son has sickle cell trait.  Maternal grandfather had lung cancer.    PLAN SUMMARY: *** TBD D/C versus 1 year *** >> Labs in 6 months = CBC/D, CMP, ferritin, iron/TIBC >> PHONE visit in 6 months (1 week after labs)   ** Last office visit 12/02/22 (alternating phone and office visits)***     REVIEW OF SYSTEMS:***  Review of Systems  Constitutional:  Positive for fatigue. Negative for appetite change, chills, diaphoresis, fever and unexpected weight change.  HENT:   Negative for lump/mass, nosebleeds and trouble swallowing.   Eyes:  Negative for eye problems.  Respiratory:  Positive for shortness of breath (2L supplemental O2). Negative for cough and hemoptysis.   Cardiovascular:  Positive for leg swelling. Negative for chest pain (at times, none today) and palpitations.  Gastrointestinal:  Positive for blood in stool, constipation and nausea. Negative for abdominal pain, diarrhea and vomiting.  Genitourinary:  Negative for hematuria.   Musculoskeletal:  Positive for arthralgias.  Skin: Negative.   Neurological:  Positive for dizziness and headaches. Negative for light-headedness.  Hematological:  Does not bruise/bleed easily.  Psychiatric/Behavioral:  Positive for depression and sleep disturbance. Negative for suicidal ideas. The patient is nervous/anxious.      PHYSICAL EXAM:***  ECOG PERFORMANCE STATUS: 2 - Symptomatic, <50% confined to bed  There were no vitals filed for this visit.  There were no vitals filed for this visit.  Physical Exam Constitutional:      Appearance: Normal appearance. He is morbidly obese.     Comments: Nasal cannula in place (2 L supplemental O2)    Cardiovascular:     Heart sounds: Normal heart sounds.  Pulmonary:     Breath sounds: Normal breath sounds. Decreased air movement present.  Neurological:     General: No focal deficit present.     Mental Status: Mental status is at baseline.  Psychiatric:        Behavior: Behavior normal. Behavior is cooperative.    PAST MEDICAL/SURGICAL HISTORY:  Past Medical History:  Diagnosis Date   Anemia    Anxiety and depression    B12 deficiency    Back pain    COPD (chronic obstructive pulmonary disease) (HCC)    COVID    9/21- hypoxia requiring oxygen   Diabetes mellitus without complication (HCC)    Frequency of urination    GERD (gastroesophageal reflux disease)    Gluteal abscess    ecoli after covid hospitalization 2021   Headache    Hypercholesterolemia    Hypertension    Obesity    Oxygen dependent    o2@ 2L   Scar tissue    Scar Tissue on lungs due to Covid   Sleep apnea    Stomach ulcer    Tachycardia    Thoracic aortic aneurysm    Past Surgical History:  Procedure Laterality Date   BIOPSY  05/17/2021   Procedure: BIOPSY;  Surgeon: Teressa Toribio SQUIBB, MD;  Location: [facility];  Service: Gastroenterology;;  EGD and COLON   BIOPSY  09/16/2022   Procedure:  BIOPSY;  Surgeon: Cindie Carlin POUR, DO;  Location: [facility];  Service: Endoscopy;;   COLONOSCOPY WITH PROPOFOL  N/A 05/17/2021   Surgeon: Teressa Toribio SQUIBB, MD; mild erythema and granularity of the colonic mucosa with benign biopsies, internal hemorrhoids suspected to be etiology of rectal bleeding.   COLONOSCOPY WITH PROPOFOL  N/A 09/16/2022   Procedure: COLONOSCOPY WITH PROPOFOL ;  Surgeon: Cindie Carlin POUR, DO;  Location: [facility];  Service: Endoscopy;  Laterality: N/A;  10:15 am, asa 3   ESOPHAGOGASTRODUODENOSCOPY (EGD) WITH PROPOFOL  N/A 05/17/2021   Surgeon: Teressa Toribio SQUIBB, MD; very mild, nonspecific distal gastritis s/p biopsied, otherwise normal exam.  Gastric biopsy was benign, negative for H.  pylori.   ESOPHAGOGASTRODUODENOSCOPY (EGD) WITH PROPOFOL  N/A 04/15/2022   Procedure: ESOPHAGOGASTRODUODENOSCOPY (EGD) WITH PROPOFOL ;  Surgeon: Cindie Carlin POUR, DO;  Location: [facility];  Service: Endoscopy;  Laterality: N/A;   FLEXIBLE SIGMOIDOSCOPY N/A 04/15/2022   Procedure: FLEXIBLE SIGMOIDOSCOPY;  Surgeon: Cindie Carlin POUR, DO;  Location: [facility];  Service: Endoscopy;  Laterality: N/A;  100pm, asa 3   GIVENS CAPSULE STUDY N/A 11/16/2021   Surgeon: Cindie Carlin POUR, DO; poor and only able to identify first gastric image.   GIVENS CAPSULE STUDY N/A 04/15/2022   Procedure: GIVENS CAPSULE STUDY;  Surgeon: Cindie Carlin POUR, DO;  Location: [facility];  Service: Endoscopy;  Laterality: N/A;    SOCIAL HISTORY:  Social History   Socioeconomic History   Marital status: Married    Spouse name: Not on file   Number of children: Not on file   Years of education: Not on file   Highest education level: Not on file  Occupational History   Occupation: stay at home spouse  Tobacco Use   Smoking status: Never   Smokeless tobacco: Never  Vaping Use   Vaping status: Never Used  Substance and Sexual Activity   Alcohol use: Not Currently    Comment: occasional   Drug use: Not Currently    Types: Marijuana    Comment: occasionally   Sexual activity: Yes  Other Topics Concern   Not on file  Social History Narrative   Lives at home with wife and two children.  Four children in all.     Social Drivers of Corporate Investment Banker Strain: Low Risk  (07/02/2023)   Overall Financial Resource Strain (CARDIA)    Difficulty of Paying Living Expenses: Not hard at all  Food Insecurity: No Food Insecurity (07/02/2023)   Hunger Vital Sign    Worried About Running Out of Food in the Last Year: Never true    Ran Out of Food in the Last Year: Never true  Transportation Needs: No Transportation Needs (07/02/2023)   PRAPARE - Administrator, Civil Service (Medical): No    Lack  of Transportation (Non-Medical): No  Physical Activity: Inactive (07/02/2023)   Exercise Vital Sign    Days of Exercise per Week: 0 days    Minutes of Exercise per Session: 0 min  Stress: No Stress Concern Present (07/02/2023)   [employer] of Occupational Health - Occupational Stress Questionnaire    Feeling of Stress : Only a little  Social Connections: Moderately Integrated (07/02/2023)   Social Connection and Isolation Panel    Frequency of Communication with Friends and Family: More than three times a week    Frequency of Social Gatherings with Friends and Family: Three times a week    Attends Religious Services: More than 4 times per  year    Active Member of Clubs or Organizations: No    Attends Banker Meetings: Never    Marital Status: Married  Catering Manager Violence: Not At Risk (07/02/2023)   Humiliation, Afraid, Rape, and Kick questionnaire    Fear of Current or Ex-Partner: No    Emotionally Abused: No    Physically Abused: No    Sexually Abused: No    FAMILY HISTORY:  Family History  Problem Relation Age of Onset   Diabetes Mother    Hypertension Mother    Hyperlipidemia Mother    Thyroid disease Mother    Diabetes Father    Hyperlipidemia Father    Hypertension Father    Depression Father    Anxiety disorder Father    Sleep apnea Father    Colon cancer Neg Hx    Stomach cancer Neg Hx     CURRENT MEDICATIONS:  Outpatient Encounter Medications as of 12/10/2023  Medication Sig   metoprolol  succinate (TOPROL -XL) 100 MG 24 hr tablet TAKE 1 TABLET(100 MG) BY MOUTH DAILY   Accu-Chek Softclix Lancets lancets Use to check blood sugar fasting, daily.   acetaminophen  (TYLENOL ) 325 MG tablet Take 650 mg by mouth every 6 (six) hours as needed for moderate pain.   albuterol  (VENTOLIN  HFA) 108 (90 Base) MCG/ACT inhaler INHALE 2 PUFFS INTO THE LUNGS EVERY 4 HOURS AS NEEDED FOR WHEEZING OR SHORTNESS OF BREATH   ascorbic acid  (VITAMIN C) 500 MG tablet Take  1 tablet (500 mg total) by mouth daily.   atorvastatin  (LIPITOR) 10 MG tablet TAKE 1 TABLET(10 MG) BY MOUTH DAILY   Blood Glucose Monitoring Suppl (ONE TOUCH ULTRA 2) w/Device KIT 1 each by Does not apply route 3 (three) times daily.   busPIRone  (BUSPAR ) 7.5 MG tablet TAKE 1 TABLET(7.5 MG) BY MOUTH TWICE DAILY   cabergoline  (DOSTINEX ) 0.5 MG tablet Take 0.5 tablets (0.25 mg total) by mouth 2 (two) times a week.   cetirizine  (ZYRTEC ) 10 MG tablet TAKE 1 TABLET(10 MG) BY MOUTH DAILY   Cholecalciferol (VITAMIN D ) 50 MCG (2000 UT) tablet Take 2,000 Units by mouth daily.   ciclopirox  (PENLAC ) 8 % solution Apply topically at bedtime. Apply over nail and surrounding skin. Apply daily over previous coat. After seven (7) days, may remove with alcohol and continue cycle.   clotrimazole -betamethasone  (LOTRISONE ) cream APPLY TOPICALLY TO THE AFFECTED AREA TWICE DAILY (Patient taking differently: Apply 1 Application topically 2 (two) times daily as needed (irritation).)   famotidine  (PEPCID ) 40 MG tablet Take 1 tablet (40 mg total) by mouth daily.   fluticasone  (FLONASE ) 50 MCG/ACT nasal spray Place 2 sprays into both nostrils daily as needed for allergies.   glucose blood test strip Use to check fasting blood sugar, daily.   hydrocortisone  (ANUSOL -HC) 25 MG suppository Place 1 suppository (25 mg total) rectally 2 (two) times daily.   Lancets MISC Check fbs daily   Multiple Vitamin (MULTIVITAMIN) tablet Take 1 tablet by mouth daily.   naloxone  (NARCAN ) nasal spray 4 mg/0.1 mL Place 1 spray into the nose as needed IF FOUND UNRESPONSIVE AND CALL 911 IMMEDIATELY   ofloxacin  (OCUFLOX ) 0.3 % ophthalmic solution Place 1 drop into eye(s) 4 (four) times daily as needed for eye infection.   omeprazole  (PRILOSEC) 40 MG capsule Take 1 capsule (40 mg total) by mouth daily. 30 minutes before breakfast   Oxycodone  HCl 10 MG TABS Take 1-2 tablets (10-20 mg total) by mouth 3 (three) times daily as needed.   Oxycodone  HCl  10  MG TABS Take 1-2 tablets (10-20 mg total) by mouth 3 (three) times daily as needed.   Oxycodone  HCl 10 MG TABS Take 1-2 tablets (10-20 mg total) by mouth 3 (three) times daily as needed.   Oxycodone  HCl 10 MG TABS Take 1-2 tablets (10-20 mg total) by mouth 3 (three) times daily as needed   Oxycodone  HCl 10 MG TABS Take 1-2 tablets (10-20 mg total) by mouth 3 (three) times daily as needed.   Oxycodone  HCl 10 MG TABS Take 1-2 tablets (10-20 mg total) by mouth 3 (three) times daily as needed.   Oxycodone  HCl 10 MG TABS Take 1-2 tablets (10-20 mg total) by mouth 3 (three) times daily as needed.   Oxycodone  HCl 10 MG TABS Take 1-2 tablets (10-20 mg total) by mouth 3 (three) times daily as needed.   prazosin  (MINIPRESS ) 2 MG capsule TAKE 1 CAPSULE(2 MG) BY MOUTH AT BEDTIME   sodium chloride  (OCEAN) 0.65 % SOLN nasal spray Place 1 spray into both nostrils as needed for congestion.   sulfamethoxazole -trimethoprim  (BACTRIM  DS) 800-160 MG tablet Take 1 tablet by mouth 2 (two) times daily.   tirzepatide  (MOUNJARO ) 15 MG/0.5ML Pen Inject 15 mg into the skin once a week.   venlafaxine  XR (EFFEXOR -XR) 75 MG 24 hr capsule TAKE 3 CAPSULES(225 MG) BY MOUTH DAILY WITH BREAKFAST   zolpidem  (AMBIEN ) 10 MG tablet TAKE 1 TABLET(10 MG) BY MOUTH AT BEDTIME AS NEEDED FOR SLEEP   No facility-administered encounter medications on file as of 12/10/2023.    ALLERGIES:  No Known Allergies  LABORATORY DATA:  I have reviewed the labs as listed.  CBC    Component Value Date/Time   WBC 8.8 12/03/2023 1413   RBC 4.90 12/03/2023 1413   HGB 10.1 (L) 12/03/2023 1413   HGB 11.0 (L) 09/24/2023 0740   HCT 35.2 (L) 12/03/2023 1413   HCT 37.6 09/24/2023 0740   PLT 217 12/03/2023 1413   PLT 273 09/24/2023 0740   MCV 71.8 (L) 12/03/2023 1413   MCV 71 (L) 09/24/2023 0740   MCH 20.6 (L) 12/03/2023 1413   MCHC 28.7 (L) 12/03/2023 1413   RDW 15.3 12/03/2023 1413   RDW 15.7 (H) 09/24/2023 0740   LYMPHSABS 2.6 12/03/2023 1413    LYMPHSABS 3.8 (H) 09/24/2023 0740   MONOABS 0.6 12/03/2023 1413   EOSABS 0.2 12/03/2023 1413   EOSABS 0.2 09/24/2023 0740   BASOSABS 0.1 12/03/2023 1413   BASOSABS 0.1 09/24/2023 0740      Latest Ref Rng & Units 12/03/2023    2:13 PM 06/02/2023    3:13 PM 02/25/2023   12:16 PM  CMP  Glucose 70 - 99 mg/dL 885  888  91   BUN 6 - 20 mg/dL 8  12  16    Creatinine 0.61 - 1.24 mg/dL 8.94  8.92  8.89   Sodium 135 - 145 mmol/L 144  142  141   Potassium 3.5 - 5.1 mmol/L 4.5  4.1  4.7   Chloride 98 - 111 mmol/L 103  104  105   CO2 22 - 32 mmol/L 30  30  29    Calcium  8.9 - 10.3 mg/dL 9.4  9.3  9.7   Total Protein 6.5 - 8.1 g/dL 7.6  7.5  7.1   Total Bilirubin 0.0 - 1.2 mg/dL 0.3  0.3  0.2   Alkaline Phos 38 - 126 U/L 92  69    AST 15 - 41 U/L 23  18  13  ALT 0 - 44 U/L 17  16  16      DIAGNOSTIC IMAGING:  I have independently reviewed the relevant imaging and discussed with the patient.   WRAP UP:  All questions were answered. The patient knows to call the clinic with any problems, questions or concerns.  Medical decision making: Moderate***  Time spent on visit: I spent 20 minutes counseling the patient face to face. The total time spent in the appointment was 30 minutes and more than 50% was on counseling.  Pleasant CHRISTELLA Barefoot, PA-C  ***

## 2023-12-10 ENCOUNTER — Inpatient Hospital Stay: Payer: Medicare (Managed Care) | Admitting: Physician Assistant

## 2023-12-10 VITALS — BP 135/87 | HR 95 | Temp 98.1°F | Resp 19 | Ht 68.0 in | Wt >= 6400 oz

## 2023-12-10 DIAGNOSIS — D563 Thalassemia minor: Secondary | ICD-10-CM

## 2023-12-10 DIAGNOSIS — D573 Sickle-cell trait: Secondary | ICD-10-CM

## 2023-12-10 DIAGNOSIS — D509 Iron deficiency anemia, unspecified: Secondary | ICD-10-CM | POA: Diagnosis not present

## 2023-12-10 DIAGNOSIS — D500 Iron deficiency anemia secondary to blood loss (chronic): Secondary | ICD-10-CM

## 2023-12-10 NOTE — Patient Instructions (Signed)
 [facility] **VISIT SUMMARY & IMPORTANT INSTRUCTIONS **   You were seen today by Pleasant Barefoot PA-C for your anemia.  Your anemia is due to a combination of sickle cell trait and alpha thalassemia trait.  You also have some anemia from your blood loss and iron deficiency. You do not need any treatment for your anemia at this time. Continue your iron tablet every other day.  FOLLOW-UP APPOINTMENT: Labs and office visit in 1 year  ** Thank you for trusting me with your healthcare!  I strive to provide all of my patients with quality care at each visit.  If you receive a survey for this visit, I would be so grateful to you for taking the time to provide feedback.  Thank you in advance!  ~ Lolly Glaus                                        Dr. Mickiel Davonna Pleasant Barefoot, PA-C     Delon Hope, NP   - - - - - - - - - - - - - - - - - -     Thank you for choosing [facility] at [facility] to provide your oncology and hematology care.  To afford each patient quality time with our provider, please arrive at least 15 minutes before your scheduled appointment time.   If you have a lab appointment with the Cancer Center please come in thru the Main Entrance and check in at the main information desk.  You need to re-schedule your appointment should you arrive 10 or more minutes late.  We strive to give you quality time with our providers, and arriving late affects you and other patients whose appointments are after yours.  Also, if you no show three or more times for appointments you may be dismissed from the clinic at the providers discretion.     Again, thank you for choosing [facility].  Our hope is that these requests will decrease the amount of time that you wait before being seen by our physicians.       _____________________________________________________________  Should you have questions after your visit to  [facility], please contact our office at [phone] and follow the prompts.  Our office hours are 8:00 a.m. and 4:30 p.m. Monday - Friday.  Please note that voicemails left after 4:00 p.m. may not be returned until the following business day.  We are closed weekends and major holidays.  You do have access to a nurse 24-7, just call the main number to the clinic [phone] and do not press any options, hold on the line and a nurse will answer the phone.    For prescription refill requests, have your pharmacy contact our office and allow 72 hours.

## 2023-12-12 NOTE — Telephone Encounter (Signed)
 Requested Prescriptions  Pending Prescriptions Disp Refills   cetirizine  (ZYRTEC ) 10 MG tablet [Pharmacy Med Name: CETIRIZINE  10MG  TABLETS] 90 tablet 0    Sig: TAKE 1 TABLET(10 MG) BY MOUTH DAILY     Ear, Nose, and Throat:  Antihistamines 2 Passed - 12/12/2023 11:31 AM      Passed - Cr in normal range and within 360 days    Creat  Date Value Ref Range Status  02/25/2023 1.10 0.60 - 1.29 mg/dL Final   Creatinine, Ser  Date Value Ref Range Status  12/03/2023 1.05 0.61 - 1.24 mg/dL Final         Passed - Valid encounter within last 12 months    Recent Outpatient Visits           6 months ago Tendinopathy of rotator cuff, right   [facility] Duanne Butler DASEN, MD   8 months ago Acute pain of right shoulder   [facility] Duanne Butler DASEN, MD   8 months ago Fever, unspecified fever cause   [facility] Duanne Butler DASEN, MD   9 months ago Type 2 diabetes mellitus with hyperglycemia, without long-term current use of insulin  [facility])   [facility] Duanne Butler DASEN, MD   1 year ago Type 2 diabetes mellitus with hyperglycemia, without long-term current use of insulin  [facility])   [facility] [place], Butler DASEN, MD

## 2023-12-16 ENCOUNTER — Other Ambulatory Visit: Payer: Self-pay | Admitting: Cardiology

## 2023-12-22 ENCOUNTER — Other Ambulatory Visit (HOSPITAL_COMMUNITY): Payer: Self-pay

## 2023-12-22 MED ORDER — OXYCODONE HCL 10 MG PO TABS
10.0000 mg | ORAL_TABLET | Freq: Three times a day (TID) | ORAL | 0 refills | Status: AC | PRN
  Filled 2023-12-24: qty 180, 30d supply, fill #0

## 2023-12-23 NOTE — Telephone Encounter (Signed)
 Pt of Dr. Lavona. Passed 3rd attempt. Does Dr. Lavona want to refill? Please advise.

## 2023-12-24 ENCOUNTER — Other Ambulatory Visit (HOSPITAL_COMMUNITY): Payer: Self-pay

## 2023-12-30 ENCOUNTER — Other Ambulatory Visit: Payer: Self-pay | Admitting: Family Medicine

## 2024-01-07 ENCOUNTER — Ambulatory Visit: Payer: Medicare (Managed Care) | Admitting: "Endocrinology

## 2024-01-07 ENCOUNTER — Encounter: Payer: Self-pay | Admitting: "Endocrinology

## 2024-01-07 VITALS — BP 132/76 | HR 109 | Resp 18 | Ht 68.0 in | Wt >= 6400 oz

## 2024-01-07 DIAGNOSIS — E291 Testicular hypofunction: Secondary | ICD-10-CM

## 2024-01-07 DIAGNOSIS — E221 Hyperprolactinemia: Secondary | ICD-10-CM | POA: Diagnosis not present

## 2024-01-07 DIAGNOSIS — D352 Benign neoplasm of pituitary gland: Secondary | ICD-10-CM | POA: Diagnosis not present

## 2024-01-07 DIAGNOSIS — E039 Hypothyroidism, unspecified: Secondary | ICD-10-CM | POA: Diagnosis not present

## 2024-01-07 LAB — POCT GLYCOSYLATED HEMOGLOBIN (HGB A1C): Hemoglobin A1C: 6 % — AB (ref 4.0–5.6)

## 2024-01-07 MED ORDER — TESTOSTERONE CYPIONATE 100 MG/ML IM SOLN: 50.0000 mg | INTRAMUSCULAR | 0 refills | Status: AC

## 2024-01-07 MED ORDER — LEVOTHYROXINE SODIUM 50 MCG PO TABS: 50.0000 ug | ORAL_TABLET | Freq: Every day | ORAL | 1 refills | Status: AC

## 2024-01-07 NOTE — Progress Notes (Signed)
 Endocrinology follow-up note                                             01/07/2024, 12:47 PM   Subjective:    Patient ID: Ricardo Lopez., male    DOB: 1975-08-08, PCP Duanne Butler DASEN, MD   Past Medical History:  Diagnosis Date   Anemia    Anxiety and depression    B12 deficiency    Back pain    COPD (chronic obstructive pulmonary disease) (HCC)    COVID    9/21- hypoxia requiring oxygen   Diabetes mellitus without complication (HCC)    Frequency of urination    GERD (gastroesophageal reflux disease)    Gluteal abscess    ecoli after covid hospitalization 2021   Headache    Hypercholesterolemia    Hypertension    Obesity    Oxygen dependent    o2@ 2L   Scar tissue    Scar Tissue on lungs due to Covid   Sleep apnea    Stomach ulcer    Tachycardia    Thoracic aortic aneurysm    Past Surgical History:  Procedure Laterality Date   BIOPSY  05/17/2021   Procedure: BIOPSY;  Surgeon: Teressa Toribio SQUIBB, MD;  Location: [facility];  Service: Gastroenterology;;  EGD and COLON   BIOPSY  09/16/2022   Procedure: BIOPSY;  Surgeon: Cindie Carlin POUR, DO;  [facility];  Service: Endoscopy;;   COLONOSCOPY WITH PROPOFOL  N/A 05/17/2021   Surgeon: Teressa Toribio SQUIBB, MD; mild erythema and granularity of the colonic mucosa with benign biopsies, internal hemorrhoids suspected to be etiology of rectal bleeding.   COLONOSCOPY WITH PROPOFOL  N/A 09/16/2022   Procedure: COLONOSCOPY WITH PROPOFOL ;  Surgeon: Cindie Carlin POUR, DO;  [facility];  Service: Endoscopy;  Laterality: N/A;  10:15 am, asa 3   ESOPHAGOGASTRODUODENOSCOPY (EGD) WITH PROPOFOL  N/A 05/17/2021   Surgeon: Teressa Toribio SQUIBB, MD; very mild, nonspecific distal gastritis s/p biopsied, otherwise normal exam.  Gastric biopsy was benign, negative for H. pylori.   ESOPHAGOGASTRODUODENOSCOPY (EGD) WITH PROPOFOL  N/A 04/15/2022   Procedure: ESOPHAGOGASTRODUODENOSCOPY (EGD) WITH PROPOFOL ;  Surgeon: Cindie Carlin POUR, DO;  [facility];  Service: Endoscopy;  Laterality: N/A;   FLEXIBLE SIGMOIDOSCOPY N/A 04/15/2022   Procedure: FLEXIBLE SIGMOIDOSCOPY;  Surgeon: Cindie Carlin POUR, DO;  [facility];  Service: Endoscopy;  Laterality: N/A;  100pm, asa 3   GIVENS CAPSULE STUDY N/A 11/16/2021   Surgeon: Cindie Carlin POUR, DO; poor and only able to identify first gastric image.   GIVENS CAPSULE STUDY N/A 04/15/2022   Procedure: GIVENS CAPSULE STUDY;  Surgeon: Cindie Carlin POUR, DO;  [facility];  Service: Endoscopy;  Laterality: N/A;   Social History   Socioeconomic History   Marital status: Married    Spouse name: Not on file   Number of children: Not on file   Years of education: Not on file   Highest education level: Not on file  Occupational History   Occupation: stay at home spouse  Tobacco Use   Smoking status: Never   Smokeless tobacco: Never  Vaping Use   Vaping status: Never Used  Substance and Sexual Activity   Alcohol use: Not Currently    Comment: occasional   Drug use: Not Currently    Types: Marijuana    Comment:  occasionally   Sexual activity: Yes  Other Topics Concern   Not on file  Social History Narrative   Lives at home with wife and two children.  Four children in all.     Social Drivers of Health   Tobacco Use: Low Risk (01/07/2024)   Patient History    Smoking Tobacco Use: Never    Smokeless Tobacco Use: Never    Passive Exposure: Not on file  Financial Resource Strain: Low Risk (07/02/2023)   Overall Financial Resource Strain (CARDIA)    Difficulty of Paying Living Expenses: Not hard at all  Food Insecurity: No Food Insecurity (07/02/2023)   Hunger Vital Sign    Worried About Running Out of Food in the Last Year: Never true    Ran Out of Food in the Last Year: Never true  Transportation Needs: No Transportation Needs (07/02/2023)   PRAPARE - Administrator, Civil Service (Medical): No    Lack of Transportation  (Non-Medical): No  Physical Activity: Inactive (07/02/2023)   Exercise Vital Sign    Days of Exercise per Week: 0 days    Minutes of Exercise per Session: 0 min  Stress: No Stress Concern Present (07/02/2023)   [employer] of Occupational Health - Occupational Stress Questionnaire    Feeling of Stress : Only a little  Social Connections: Moderately Integrated (07/02/2023)   Social Connection and Isolation Panel    Frequency of Communication with Friends and Family: More than three times a week    Frequency of Social Gatherings with Friends and Family: Three times a week    Attends Religious Services: More than 4 times per year    Active Member of Clubs or Organizations: No    Attends Banker Meetings: Never    Marital Status: Married  Depression (PHQ2-9): Low Risk (12/10/2023)   Depression (PHQ2-9)    PHQ-2 Score: 0  Alcohol Screen: Low Risk (07/02/2023)   Alcohol Screen    Last Alcohol Screening Score (AUDIT): 0  Housing: Unknown (07/02/2023)   Housing Stability Vital Sign    Unable to Pay for Housing in the Last Year: No    Number of Times Moved in the Last Year: Not on file    Homeless in the Last Year: No  Utilities: Not At Risk (07/02/2023)   AHC Utilities    Threatened with loss of utilities: No  Health Literacy: Adequate Health Literacy (07/02/2023)   B1300 Health Literacy    Frequency of need for help with medical instructions: Never   Family History  Problem Relation Age of Onset   Diabetes Mother    Hypertension Mother    Hyperlipidemia Mother    Thyroid disease Mother    Diabetes Father    Hyperlipidemia Father    Hypertension Father    Depression Father    Anxiety disorder Father    Sleep apnea Father    Colon cancer Neg Hx    Stomach cancer Neg Hx    Outpatient Encounter Medications as of 01/07/2024  Medication Sig   Accu-Chek Softclix Lancets lancets Use to check blood sugar fasting, daily.   acetaminophen  (TYLENOL ) 325 MG tablet Take 650  mg by mouth every 6 (six) hours as needed for moderate pain.   albuterol  (VENTOLIN  HFA) 108 (90 Base) MCG/ACT inhaler INHALE 2 PUFFS INTO THE LUNGS EVERY 4 HOURS AS NEEDED FOR WHEEZING OR SHORTNESS OF BREATH   ascorbic acid  (VITAMIN C) 500 MG tablet Take 1 tablet (500 mg total) by mouth  daily.   atorvastatin  (LIPITOR) 10 MG tablet TAKE 1 TABLET(10 MG) BY MOUTH DAILY   Blood Glucose Monitoring Suppl (ONE TOUCH ULTRA 2) w/Device KIT 1 each by Does not apply route 3 (three) times daily.   busPIRone  (BUSPAR ) 7.5 MG tablet TAKE 1 TABLET(7.5 MG) BY MOUTH TWICE DAILY   cabergoline  (DOSTINEX ) 0.5 MG tablet Take 0.5 tablets (0.25 mg total) by mouth 2 (two) times a week.   cetirizine  (ZYRTEC ) 10 MG tablet TAKE 1 TABLET(10 MG) BY MOUTH DAILY   Cholecalciferol (VITAMIN D ) 50 MCG (2000 UT) tablet Take 2,000 Units by mouth daily.   ciclopirox  (PENLAC ) 8 % solution Apply topically at bedtime. Apply over nail and surrounding skin. Apply daily over previous coat. After seven (7) days, may remove with alcohol and continue cycle.   clotrimazole -betamethasone  (LOTRISONE ) cream APPLY TOPICALLY TO THE AFFECTED AREA TWICE DAILY (Patient taking differently: Apply 1 Application topically 2 (two) times daily as needed (irritation).)   famotidine  (PEPCID ) 40 MG tablet Take 1 tablet (40 mg total) by mouth daily.   fluticasone  (FLONASE ) 50 MCG/ACT nasal spray Place 2 sprays into both nostrils daily as needed for allergies.   glucose blood test strip Use to check fasting blood sugar, daily.   hydrocortisone  (ANUSOL -HC) 25 MG suppository Place 1 suppository (25 mg total) rectally 2 (two) times daily.   ibuprofen (ADVIL) 800 MG tablet Take 800 mg by mouth every 8 (eight) hours as needed.   Lancets MISC Check fbs daily   levothyroxine  (SYNTHROID ) 50 MCG tablet Take 1 tablet (50 mcg total) by mouth daily before breakfast.   metoprolol  succinate (TOPROL -XL) 100 MG 24 hr tablet TAKE 1 TABLET(100 MG) BY MOUTH DAILY   Multiple Vitamin  (MULTIVITAMIN) tablet Take 1 tablet by mouth daily.   naloxone  (NARCAN ) nasal spray 4 mg/0.1 mL Place 1 spray into the nose as needed IF FOUND UNRESPONSIVE AND CALL 911 IMMEDIATELY   ofloxacin  (OCUFLOX ) 0.3 % ophthalmic solution Place 1 drop into eye(s) 4 (four) times daily as needed for eye infection.   omeprazole  (PRILOSEC) 40 MG capsule Take 1 capsule (40 mg total) by mouth daily. 30 minutes before breakfast   Oxycodone  HCl 10 MG TABS Take 1-2 tablets (10-20 mg total) by mouth 3 (three) times daily as needed.   prazosin  (MINIPRESS ) 2 MG capsule TAKE 1 CAPSULE(2 MG) BY MOUTH AT BEDTIME   sodium chloride  (OCEAN) 0.65 % SOLN nasal spray Place 1 spray into both nostrils as needed for congestion.   testosterone  cypionate (DEPOTESTOTERONE CYPIONATE) 100 MG/ML injection Inject 0.5 mLs (50 mg total) into the muscle every 14 (fourteen) days. For IM use only   tirzepatide  (MOUNJARO ) 15 MG/0.5ML Pen Inject 15 mg into the skin once a week.   venlafaxine  XR (EFFEXOR -XR) 75 MG 24 hr capsule TAKE 3 CAPSULES(225 MG) BY MOUTH DAILY WITH BREAKFAST   zolpidem  (AMBIEN ) 10 MG tablet TAKE 1 TABLET(10 MG) BY MOUTH AT BEDTIME AS NEEDED FOR SLEEP   No facility-administered encounter medications on file as of 01/07/2024.   ALLERGIES: No Known Allergies  VACCINATION STATUS: Immunization History  Administered Date(s) Administered   Influenza,inj,Quad PF,6+ Mos 11/23/2019   Influenza-Unspecified 10/08/2021    HPI Younis Mathey. is 48 y.o. male who presents today with a medical history as above. he is being seen in follow-up after he was seen in consultation for  pituitary macroadenoma requested by Duanne Butler DASEN, MD.  He was found to have hyperprolactinemia and hypogonadism. He was put on cabergoline  0.25 mg twice  a week to treat hyperprolactinemia to which he seems to have responded with significant improvement in his prolactin level as well as his testosterone  level. Patient reports overweight/obesity for  the majority of his adulthood.  He fathered 2 children.  He has always known about having only 1 testicle.  He wishes to be initiated on androgen replacement therapy. Due to ongoing fatigue he underwent testosterone  measurement in February 2025 when he was found to have profound hypogonadism but total testosterone  of 29.  Subsequently, he was offered more endocrine workup which showed FSH of 20.1, elevated, prolactin of 21.6, elevated.  His testosterone  remains low at 41.  After treatment since May 2025 with cabergoline , his prolactin has improved to 4.7 and testosterone  improved to 175 ng per DL.  However, his most recent labs showed another dip in his total testosterone  at 142, high TSH of 5.1 and low free T4 of 1.07.  He underwent MRI of brain with and without contrast on March 30, 2023 which showed the pituitary gland to be diffusely enlarged and mildly heterogeneous measuring 1.3 x 2.0 x 1.4 cm. There was a convex superior margin with the  gland extending into the suprasellar cistern without significant mass effect on the optic nerves or chiasm.  Patient with medical history of CHF, sleep apnea, thoracic aortic aneurysm, as well as complicated COVID which rendered him on oxygen through nasal cannula continuously.  His current flow is 2 L/m.  He presents with 12 pounds of weight gain since last visit despite being on Mounjaro  15 mg subcutaneously weekly.     His other medical problems include type 2 diabetes , hyperlipidemia on atorvastatin  10 mg p.o. nightly.  His point-of-care A1c today is 6%.    He also has medical history of hypertension on management with metoprolol  100 mg p.o. daily.  His other medications include oxycodone  10 mg 6 times a day, albuterol , buspirone , omeprazole , prazosin , cetirizine , famotidine , zolpidem .   He denies galactorrhea, nor retro-orbital headache.  Review of Systems  Constitutional: +mildly fluctuating body weight,  no fatigue, no subjective hyperthermia, no  subjective hypothermia, + on ongoing oxygen supplement through nasal cannula   Objective:       01/07/2024    8:55 AM 12/10/2023    2:23 PM 09/29/2023    9:29 AM  Vitals with BMI  Height 5' 8 5' 8 5' 8  Weight 422 lbs 417 lbs 410 lbs 3 oz  BMI 64.18 63.42 62.38  Systolic 132 135 857  Diastolic 76 87 84  Pulse 109 95 92    BP 132/76   Pulse (!) 109   Resp 18   Ht 5' 8 (1.727 m)   Wt (!) 422 lb (191.4 kg)   SpO2 95%   BMI 64.16 kg/m   Wt Readings from Last 3 Encounters:  01/07/24 (!) 422 lb (191.4 kg)  12/10/23 (!) 417 lb (189.1 kg)  09/29/23 (!) 410 lb 3.2 oz (186.1 kg)    Physical Exam  Constitutional:  Body mass index is 64.16 kg/m.,  not in acute distress, normal state of mind, + on portable oxygen canister. Eyes: PERRLA, EOMI, no exophthalmos ENT: moist mucous membranes, no gross thyromegaly, no gross cervical lymphadenopathy  Genital exam: He has soft-firm right testicle measuring 12 cc, absent left testicle.  No intrascrotal mass lesion, large thighs bilaterally. Neurological: no tremor with outstretched hands, Deep tendon reflexes normal in bilateral lower extremities.  CMP ( most recent) CMP     Component Value Date/Time  NA 144 12/03/2023 1413   NA 141 05/18/2020 1357   K 4.5 12/03/2023 1413   CL 103 12/03/2023 1413   CO2 30 12/03/2023 1413   GLUCOSE 114 (H) 12/03/2023 1413   BUN 8 12/03/2023 1413   BUN 13 05/18/2020 1357   CREATININE 1.05 12/03/2023 1413   CREATININE 1.10 02/25/2023 1216   CALCIUM  9.4 12/03/2023 1413   PROT 7.6 12/03/2023 1413   PROT 7.4 05/18/2020 1357   ALBUMIN 4.2 12/03/2023 1413   ALBUMIN 4.3 05/18/2020 1357   AST 23 12/03/2023 1413   ALT 17 12/03/2023 1413   ALKPHOS 92 12/03/2023 1413   BILITOT 0.3 12/03/2023 1413   BILITOT <0.2 05/18/2020 1357   GFR 84.91 03/21/2021 1158   EGFR 83 02/25/2023 1216   EGFR 98 05/18/2020 1357   GFRNONAA >60 12/03/2023 1413   GFRNONAA 95 12/31/2019 1446     Diabetic Labs (most  recent): Lab Results  Component Value Date   HGBA1C 6.0 (A) 01/07/2024   HGBA1C 6.0 (H) 02/25/2023   HGBA1C 6.1 (H) 07/02/2022     Lipid Panel ( most recent) Lipid Panel     Component Value Date/Time   CHOL 165 02/27/2023 1052   CHOL 221 (H) 05/18/2020 1357   TRIG 148 02/27/2023 1052   HDL 44 02/27/2023 1052   HDL 53 05/18/2020 1357   CHOLHDL 3.8 02/27/2023 1052   LDLCALC 96 02/27/2023 1052   LABVLDL 23 05/18/2020 1357      Lab Results  Component Value Date   TSH 5.120 (H) 01/06/2024   TSH 4.680 (H) 05/18/2020   TSH 5.270 (H) 01/28/2020   FREET4 1.07 01/06/2024    Recent Results (from the past 2160 hours)  Iron and TIBC ([facility] ONLY)     Status: Abnormal   Collection Time: 12/03/23  2:12 PM  Result Value Ref Range   Iron 40 (L) 45 - 182 ug/dL   TIBC 713 749 - 549 ug/dL   Saturation Ratios 14 (L) 17.9 - 39.5 %   UIBC 246 ug/dL    Comment: Performed at [facility], [address], [place], [place] [address]  Ferritin     Status: None   Collection Time: 12/03/23  2:12 PM  Result Value Ref Range   Ferritin 254 24 - 336 ng/mL    Comment: Performed at [facility], [address] [place], [place] [address]  Comprehensive metabolic panel     Status: Abnormal   Collection Time: 12/03/23  2:13 PM  Result Value Ref Range   Sodium 144 135 - 145 mmol/L   Potassium 4.5 3.5 - 5.1 mmol/L   Chloride 103 98 - 111 mmol/L   CO2 30 22 - 32 mmol/L   Glucose, Bld 114 (H) 70 - 99 mg/dL    Comment: Glucose reference range applies only to samples taken after fasting for at least 8 hours.   BUN 8 6 - 20 mg/dL   Creatinine, Ser 8.94 0.61 - 1.24 mg/dL   Calcium  9.4 8.9 - 10.3 mg/dL   Total Protein 7.6 6.5 - 8.1 g/dL   Albumin 4.2 3.5 - 5.0 g/dL   AST 23 15 - 41 U/L   ALT 17 0 - 44 U/L   Alkaline Phosphatase 92 38 - 126 U/L   Total Bilirubin 0.3 0.0 - 1.2 mg/dL   GFR, Estimated >39 >39 mL/min    Comment: (NOTE) Calculated using the CKD-EPI Creatinine Equation  (2021)    Anion gap 10 5 - 15  Comment: Performed at [facility], [address], [place], [place] [address]  CBC with Differential     Status: Abnormal   Collection Time: 12/03/23  2:13 PM  Result Value Ref Range   WBC 8.8 4.0 - 10.5 K/uL   RBC 4.90 4.22 - 5.81 MIL/uL   Hemoglobin 10.1 (L) 13.0 - 17.0 g/dL   HCT 64.7 (L) 60.9 - 47.9 %   MCV 71.8 (L) 80.0 - 100.0 fL   MCH 20.6 (L) 26.0 - 34.0 pg   MCHC 28.7 (L) 30.0 - 36.0 g/dL   RDW 84.6 88.4 - 84.4 %   Platelets 217 150 - 400 K/uL   nRBC 0.0 0.0 - 0.2 %   Neutrophils Relative % 60 %   Neutro Abs 5.3 1.7 - 7.7 K/uL   Lymphocytes Relative 29 %   Lymphs Abs 2.6 0.7 - 4.0 K/uL   Monocytes Relative 7 %   Monocytes Absolute 0.6 0.1 - 1.0 K/uL   Eosinophils Relative 3 %   Eosinophils Absolute 0.2 0.0 - 0.5 K/uL   Basophils Relative 1 %   Basophils Absolute 0.1 0.0 - 0.1 K/uL   Immature Granulocytes 0 %   Abs Immature Granulocytes 0.01 0.00 - 0.07 K/uL    Comment: Performed at [facility], [address], [place], [place] [address]  TSH     Status: Abnormal   Collection Time: 01/06/24  9:54 AM  Result Value Ref Range   TSH 5.120 (H) 0.450 - 4.500 uIU/mL  T4, free     Status: None   Collection Time: 01/06/24  9:54 AM  Result Value Ref Range   Free T4 1.07 0.82 - 1.77 ng/dL  Prolactin     Status: None   Collection Time: 01/06/24  9:54 AM  Result Value Ref Range   Prolactin 4.3 3.9 - 22.7 ng/mL  Testosterone , Free, Total, SHBG     Status: Abnormal   Collection Time: 01/06/24  9:54 AM  Result Value Ref Range   Testosterone  142 (L) 264 - 916 ng/dL    Comment: Adult male reference interval is based on a population of healthy nonobese males (BMI <30) between 27 and 51 years old. Travison, et.al. JCEM [phone]. PMID: [id number].    Testosterone , Free 3.2 (L) 6.8 - 21.5 pg/mL   Sex Hormone Binding 42.4 16.5 - 55.9 nmol/L  Insulin -like growth factor     Status: None   Collection Time: 01/06/24  9:54 AM  Result Value  Ref Range   Insulin -Like GF-1 107 81 - 263 ng/mL  Macroprolactin     Status: None (Preliminary result)   Collection Time: 01/06/24  9:54 AM  Result Value Ref Range   Prolactin, Serum (ICMA) WILL FOLLOW    Monomeric Prolactin (ICMA) WILL FOLLOW    Percent Macroprolactin WILL FOLLOW   T3     Status: None   Collection Time: 01/06/24  9:54 AM  Result Value Ref Range   T3, Total 164 71 - 180 ng/dL  Luteinizing hormone     Status: Abnormal   Collection Time: 01/06/24  9:54 AM  Result Value Ref Range   LH 15.0 (H) 1.7 - 8.6 mIU/mL  Follicle stimulating hormone     Status: Abnormal   Collection Time: 01/06/24  9:54 AM  Result Value Ref Range   FSH 18.4 (H) 1.5 - 12.4 mIU/mL  POCT glycosylated hemoglobin (Hb A1C)     Status: Abnormal   Collection Time: 01/07/24  9:02 AM  Result Value Ref Range  Hemoglobin A1C 6.0 (A) 4.0 - 5.6 %   HbA1c POC (<> result, manual entry)     HbA1c, POC (prediabetic range)     HbA1c, POC (controlled diabetic range)           Latest Reference Range & Units 02/25/23 12:16 02/27/23 09:34 06/02/23 15:13 09/24/23 07:40  LH 1.5 - 9.3 mIU/mL  7.1    FSH 1.4 - 12.8 mIU/mL  20.1 (H)    Prolactin 3.9 - 22.7 ng/mL  21.6 (H)  4.7  eAG (mmol/L) mmol/L 7.0     Glucose 70 - 99 mg/dL 91  888 (H)   Hemoglobin A1C <5.7 % of total Hgb 6.0 (H)     Sex Horm Binding Glob, Serum 16.5 - 55.9 nmol/L 38   39.3  Testosterone  264 - 916 ng/dL 29 (L) 41 (L)  824 (L)  Testosterone , Bioavailable 110.0 - 575.0 ng/dL Pend     Testosterone  Free 46.0 - 224.0 pg/mL See below   WILL FOLLOW (P)   Assessment & Plan:   1. Pituitary macroadenoma  2. Hyperprolactinemia 3. Hypogonadism 4.  Type 2 diabetes/morbid obesity 5.  Hypothyroidism-new diagnosis    - I have reviewed his available endocrine records and clinically evaluated the patient. - Based on these reviews, he has pituitary macroadenoma, type 2 diabetes, hyperprolactinemia, hypogonadism, hypothyroidism, morbid obesity among  other major medical problems including CHF, sleep apnea, oxygen dependent respiratory failure. His hypogonadism is secondary and of multiple etiology including opioids, hyperprolactinemia, history macroadenoma. He is responding to cabergoline  from the point of view of prolactin and he is advised to continue cabergoline  0.25 mg p.o. twice a week.   Since his total testosterone  is dropping, he may benefit from early intervention with low-dose testosterone  options were discussed with him between topical and injectable preparations.  He opted in injectable testosterone  preparations.  I discussed and prescribed testosterone  50 mg IM every 14 days until next measurement.  Safe use of testosterone  discussed with him.  Target level of testosterone  will be between 250 and 350 nanograms per DL for him.     He will need better assessment for the pituitary macroadenoma.  His labs showed normal IGF-I, macroprolactin is still pending.   The pattern of his thyroid function test is consistent with mild hypothyroidism.  I discussed initiated levothyroxine  50 mcg p.o. daily before breakfast.   - We discussed about the correct intake of his thyroid hormone, on empty stomach at fasting, with water, separated by at least 30 minutes from breakfast and other medications,  and separated by more than 4 hours from calcium , iron, multivitamins, acid reflux medications (PPIs). -Patient is made aware of the fact that thyroid hormone replacement is needed for life, dose to be adjusted by periodic monitoring of thyroid function tests.   In view of pituitary macroadenoma, he will need a follow-up MRI 1 year from the last one to assess treatment effect.    He will continue to benefit from GLP-1 receptor agonist, advised to continue Mounjaro  15 mg subcutaneously weekly.  His point-of-care A1c is 6%.  This is consistent with controlled diabetes.  He may continue to benefit from GLP-1 receptor agonist, currently Mounjaro  15 mg  subcutaneously weekly.    - he is advised to maintain close follow up with Duanne Butler DASEN, MD for primary care needs.   I spent  40  minutes in the care of the patient today including review of labs from Thyroid Function, CMP, and other relevant labs ; imaging/biopsy records (  current and previous including abstractions from other facilities); face-to-face time discussing  his lab results and symptoms, medications doses, his options of short and long term treatment based on the latest standards of care / guidelines;   and documenting the encounter.  Ricardo Lopez.  participated in the discussions, expressed understanding, and voiced agreement with the above plans.  All questions were answered to his satisfaction. he is encouraged to contact clinic should he have any questions or concerns prior to his return visit.   Follow up plan: Return in about 4 months (around 05/07/2024) for Fasting Labs  in AM B4 8, Pituitary MRI.   Ranny Earl, MD [facility] [address] [place], [place] [address] Phone: [phone]  Fax: [phone]     01/07/2024, 12:47 PM  This note was partially dictated with voice recognition software. Similar sounding words can be transcribed inadequately or may not  be corrected upon review.

## 2024-01-08 ENCOUNTER — Ambulatory Visit: Payer: Medicare (Managed Care) | Admitting: Family Medicine

## 2024-01-08 VITALS — BP 126/84 | HR 111 | Temp 98.1°F | Ht 68.0 in | Wt >= 6400 oz

## 2024-01-08 DIAGNOSIS — K529 Noninfective gastroenteritis and colitis, unspecified: Secondary | ICD-10-CM

## 2024-01-08 MED ORDER — SULFAMETHOXAZOLE-TRIMETHOPRIM 800-160 MG PO TABS: 1.0000 | ORAL_TABLET | Freq: Two times a day (BID) | ORAL | 0 refills | Status: AC

## 2024-01-08 NOTE — Progress Notes (Signed)
 Subjective:    Patient ID: Ricardo Lopez., male    DOB: 04-26-1975, 48 y.o.   MRN: [id number]  Patient states that he just does not feel well.  His endocrinologist recently started him on testosterone  for his prolactinoma and hypogonadism.  However this has not had a sufficient amount of time to help.  He also recently started thyroid medication.  He comes to me today complaining of not only fatigue but he also reports 2 days of diarrhea and upset stomach.  He has been on Mounjaro  15 mg subcu weekly for more than a year and has never had any trouble with this.  He denies any vomiting or nausea or abdominal pain.  However he is having loose watery stools for the last 24 hours.  He denies any fever.  He denies any rhinorrhea or sore throat.  He denies any cough or chest pain.  He denies any dysuria.  He also has a cyst forming at the superior aspect of his gluteal cleft.  There is a open pore/blackhead forming from an ingrown hair and he appears to be developing a pilonidal cyst.  There is no erythema or warmth or pain.  There is very little fluctuance that would be drained with an I&D Past Medical History:  Diagnosis Date   Anemia    Anxiety and depression    B12 deficiency    Back pain    COPD (chronic obstructive pulmonary disease) (HCC)    COVID    9/21- hypoxia requiring oxygen   Diabetes mellitus without complication (HCC)    Frequency of urination    GERD (gastroesophageal reflux disease)    Gluteal abscess    ecoli after covid hospitalization 2021   Headache    Hypercholesterolemia    Hypertension    Obesity    Oxygen dependent    o2@ 2L   Scar tissue    Scar Tissue on lungs due to Covid   Sleep apnea    Stomach ulcer    Tachycardia    Thoracic aortic aneurysm    Past Surgical History:  Procedure Laterality Date   BIOPSY  05/17/2021   Procedure: BIOPSY;  Surgeon: Teressa Toribio SQUIBB, MD;  Location: [facility];  Service: Gastroenterology;;  EGD and COLON   BIOPSY   09/16/2022   Procedure: BIOPSY;  Surgeon: Cindie Carlin POUR, DO;  Location: [facility];  Service: Endoscopy;;   COLONOSCOPY WITH PROPOFOL  N/A 05/17/2021   Surgeon: Teressa Toribio SQUIBB, MD; mild erythema and granularity of the colonic mucosa with benign biopsies, internal hemorrhoids suspected to be etiology of rectal bleeding.   COLONOSCOPY WITH PROPOFOL  N/A 09/16/2022   Procedure: COLONOSCOPY WITH PROPOFOL ;  Surgeon: Cindie Carlin POUR, DO;  Location: [facility];  Service: Endoscopy;  Laterality: N/A;  10:15 am, asa 3   ESOPHAGOGASTRODUODENOSCOPY (EGD) WITH PROPOFOL  N/A 05/17/2021   Surgeon: Teressa Toribio SQUIBB, MD; very mild, nonspecific distal gastritis s/p biopsied, otherwise normal exam.  Gastric biopsy was benign, negative for H. pylori.   ESOPHAGOGASTRODUODENOSCOPY (EGD) WITH PROPOFOL  N/A 04/15/2022   Procedure: ESOPHAGOGASTRODUODENOSCOPY (EGD) WITH PROPOFOL ;  Surgeon: Cindie Carlin POUR, DO;  Location: [facility];  Service: Endoscopy;  Laterality: N/A;   FLEXIBLE SIGMOIDOSCOPY N/A 04/15/2022   Procedure: FLEXIBLE SIGMOIDOSCOPY;  Surgeon: Cindie Carlin POUR, DO;  Location: [facility];  Service: Endoscopy;  Laterality: N/A;  100pm, asa 3   GIVENS CAPSULE STUDY N/A 11/16/2021   Surgeon: Cindie Carlin POUR, DO; poor and only able to identify  first gastric image.   GIVENS CAPSULE STUDY N/A 04/15/2022   Procedure: GIVENS CAPSULE STUDY;  Surgeon: Cindie Carlin POUR, DO;  Location: [facility];  Service: Endoscopy;  Laterality: N/A;    Current Outpatient Medications on File Prior to Visit  Medication Sig Dispense Refill   Accu-Chek Softclix Lancets lancets Use to check blood sugar fasting, daily. 100 each 12   acetaminophen  (TYLENOL ) 325 MG tablet Take 650 mg by mouth every 6 (six) hours as needed for moderate pain.     albuterol  (VENTOLIN  HFA) 108 (90 Base) MCG/ACT inhaler INHALE 2 PUFFS INTO THE LUNGS EVERY 4 HOURS AS NEEDED FOR WHEEZING OR SHORTNESS OF BREATH 6.7 g 1   ascorbic acid  (VITAMIN  C) 500 MG tablet Take 1 tablet (500 mg total) by mouth daily. 30 tablet 1   atorvastatin  (LIPITOR) 10 MG tablet TAKE 1 TABLET(10 MG) BY MOUTH DAILY 90 tablet 1   Blood Glucose Monitoring Suppl (ONE TOUCH ULTRA 2) w/Device KIT 1 each by Does not apply route 3 (three) times daily. 1 kit 1   busPIRone  (BUSPAR ) 7.5 MG tablet TAKE 1 TABLET(7.5 MG) BY MOUTH TWICE DAILY 60 tablet 3   cabergoline  (DOSTINEX ) 0.5 MG tablet Take 0.5 tablets (0.25 mg total) by mouth 2 (two) times a week. 10 tablet 1   cetirizine  (ZYRTEC ) 10 MG tablet TAKE 1 TABLET(10 MG) BY MOUTH DAILY 90 tablet 0   Cholecalciferol (VITAMIN D ) 50 MCG (2000 UT) tablet Take 2,000 Units by mouth daily.     ciclopirox  (PENLAC ) 8 % solution Apply topically at bedtime. Apply over nail and surrounding skin. Apply daily over previous coat. After seven (7) days, may remove with alcohol and continue cycle. 6.6 mL 5   clotrimazole -betamethasone  (LOTRISONE ) cream APPLY TOPICALLY TO THE AFFECTED AREA TWICE DAILY (Patient taking differently: Apply 1 Application topically 2 (two) times daily as needed (irritation).) 45 g 1   famotidine  (PEPCID ) 40 MG tablet Take 1 tablet (40 mg total) by mouth daily. 30 tablet 11   fluticasone  (FLONASE ) 50 MCG/ACT nasal spray Place 2 sprays into both nostrils daily as needed for allergies. 16 g 3   glucose blood test strip Use to check fasting blood sugar, daily. 100 each 12   hydrocortisone  (ANUSOL -HC) 25 MG suppository Place 1 suppository (25 mg total) rectally 2 (two) times daily. 30 suppository 1   ibuprofen (ADVIL) 800 MG tablet Take 800 mg by mouth every 8 (eight) hours as needed.     Lancets MISC Check fbs daily 100 each 11   levothyroxine  (SYNTHROID ) 50 MCG tablet Take 1 tablet (50 mcg total) by mouth daily before breakfast. 90 tablet 1   metoprolol  succinate (TOPROL -XL) 100 MG 24 hr tablet TAKE 1 TABLET(100 MG) BY MOUTH DAILY 15 tablet 0   Multiple Vitamin (MULTIVITAMIN) tablet Take 1 tablet by mouth daily.      naloxone  (NARCAN ) nasal spray 4 mg/0.1 mL Place 1 spray into the nose as needed IF FOUND UNRESPONSIVE AND CALL 911 IMMEDIATELY 2 each 1   ofloxacin  (OCUFLOX ) 0.3 % ophthalmic solution Place 1 drop into eye(s) 4 (four) times daily as needed for eye infection. 5 mL 0   omeprazole  (PRILOSEC) 40 MG capsule Take 1 capsule (40 mg total) by mouth daily. 30 minutes before breakfast 90 capsule 3   Oxycodone  HCl 10 MG TABS Take 1-2 tablets (10-20 mg total) by mouth 3 (three) times daily as needed. 180 tablet 0   prazosin  (MINIPRESS ) 2 MG capsule TAKE 1 CAPSULE(2 MG) BY  MOUTH AT BEDTIME 30 capsule 0   sodium chloride  (OCEAN) 0.65 % SOLN nasal spray Place 1 spray into both nostrils as needed for congestion. 88 mL 0   testosterone  cypionate (DEPOTESTOSTERONE CYPIONATE) 200 MG/ML injection SMARTSIG:Milliliter(s) IM     testosterone  cypionate (DEPOTESTOTERONE CYPIONATE) 100 MG/ML injection Inject 0.5 mLs (50 mg total) into the muscle every 14 (fourteen) days. For IM use only 10 mL 0   tirzepatide  (MOUNJARO ) 15 MG/0.5ML Pen Inject 15 mg into the skin once a week. 6 mL 3   venlafaxine  XR (EFFEXOR -XR) 75 MG 24 hr capsule TAKE 3 CAPSULES(225 MG) BY MOUTH DAILY WITH BREAKFAST 270 capsule 3   zolpidem  (AMBIEN ) 10 MG tablet TAKE 1 TABLET(10 MG) BY MOUTH AT BEDTIME AS NEEDED FOR SLEEP 30 tablet 2   No current facility-administered medications on file prior to visit.   No Known Allergies Social History   Socioeconomic History   Marital status: Married    Spouse name: Not on file   Number of children: Not on file   Years of education: Not on file   Highest education level: Not on file  Occupational History   Occupation: stay at home spouse  Tobacco Use   Smoking status: Never   Smokeless tobacco: Never  Vaping Use   Vaping status: Never Used  Substance and Sexual Activity   Alcohol use: Not Currently    Comment: occasional   Drug use: Not Currently    Types: Marijuana    Comment: occasionally   Sexual  activity: Yes  Other Topics Concern   Not on file  Social History Narrative   Lives at home with wife and two children.  Four children in all.     Social Drivers of Health   Tobacco Use: Low Risk (01/08/2024)   Patient History    Smoking Tobacco Use: Never    Smokeless Tobacco Use: Never    Passive Exposure: Not on file  Financial Resource Strain: Low Risk (07/02/2023)   Overall Financial Resource Strain (CARDIA)    Difficulty of Paying Living Expenses: Not hard at all  Food Insecurity: No Food Insecurity (07/02/2023)   Hunger Vital Sign    Worried About Running Out of Food in the Last Year: Never true    Ran Out of Food in the Last Year: Never true  Transportation Needs: No Transportation Needs (07/02/2023)   PRAPARE - Administrator, Civil Service (Medical): No    Lack of Transportation (Non-Medical): No  Physical Activity: Inactive (07/02/2023)   Exercise Vital Sign    Days of Exercise per Week: 0 days    Minutes of Exercise per Session: 0 min  Stress: No Stress Concern Present (07/02/2023)   [employer] of Occupational Health - Occupational Stress Questionnaire    Feeling of Stress : Only a little  Social Connections: Moderately Integrated (07/02/2023)   Social Connection and Isolation Panel    Frequency of Communication with Friends and Family: More than three times a week    Frequency of Social Gatherings with Friends and Family: Three times a week    Attends Religious Services: More than 4 times per year    Active Member of Clubs or Organizations: No    Attends Banker Meetings: Never    Marital Status: Married  Catering Manager Violence: Not At Risk (07/02/2023)   Humiliation, Afraid, Rape, and Kick questionnaire    Fear of Current or Ex-Partner: No    Emotionally Abused: No    Physically  Abused: No    Sexually Abused: No  Depression (PHQ2-9): Low Risk (01/08/2024)   Depression (PHQ2-9)    PHQ-2 Score: 0  Alcohol Screen: Low Risk  (07/02/2023)   Alcohol Screen    Last Alcohol Screening Score (AUDIT): 0  Housing: Unknown (07/02/2023)   Housing Stability Vital Sign    Unable to Pay for Housing in the Last Year: No    Number of Times Moved in the Last Year: Not on file    Homeless in the Last Year: No  Utilities: Not At Risk (07/02/2023)   [facility] Utilities    Threatened with loss of utilities: No  Health Literacy: Adequate Health Literacy (07/02/2023)   B1300 Health Literacy    Frequency of need for help with medical instructions: Never    Review of Systems  Skin:  Positive for rash.  All other systems reviewed and are negative.      Objective:   Physical Exam Vitals reviewed.  Constitutional:      Appearance: He is obese. He is not ill-appearing or toxic-appearing.  HENT:     Right Ear: Tympanic membrane and ear canal normal.     Left Ear: Tympanic membrane and ear canal normal.     Mouth/Throat:     Pharynx: Oropharynx is clear. No oropharyngeal exudate or posterior oropharyngeal erythema.  Cardiovascular:     Rate and Rhythm: Normal rate and regular rhythm.     Heart sounds: Normal heart sounds.  Pulmonary:     Effort: Pulmonary effort is normal. No respiratory distress.     Breath sounds: Normal breath sounds. No stridor. No wheezing, rhonchi or rales.  Abdominal:     General: Bowel sounds are normal. There is no distension.     Palpations: Abdomen is soft.     Tenderness: There is no abdominal tenderness. There is no guarding or rebound.  Musculoskeletal:       Legs:  Neurological:     Mental Status: He is alert.         Assessment & Plan:   Gastroenteritis Patient has viral gastroenteritis I believe.  Use Imodium  1 tablet after each loose stool up to 6 tablets a day to help calm down the diarrhea.  Recommended a BRAT diet and pushing fluids.  He has a small cyst forming at the superior aspect of his gluteal cleft.  Is not infected today.  It does not require incision and drainage.  He may  benefit from surgical excision in the future.  I explained to patient he does not require antibiotics but if it gets red hot and infected, he can start Bactrim  double strength tablets twice daily for 7 days and return immediately for I&D.

## 2024-01-11 ENCOUNTER — Other Ambulatory Visit: Payer: Self-pay | Admitting: "Endocrinology

## 2024-01-11 LAB — TSH: TSH: 5.12 u[IU]/mL — ABNORMAL HIGH (ref 0.450–4.500)

## 2024-01-11 LAB — MACROPROLACTIN
Monomeric Prolactin (ICMA): 4.26 ng/mL
Percent Macroprolactin: 10 %
Prolactin, Serum (ICMA): 4.71 ng/mL

## 2024-01-11 LAB — TESTOSTERONE, FREE, TOTAL, SHBG
Sex Hormone Binding: 42.4 nmol/L (ref 16.5–55.9)
Testosterone, Free: 3.2 pg/mL — ABNORMAL LOW (ref 6.8–21.5)
Testosterone: 142 ng/dL — ABNORMAL LOW (ref 264–916)

## 2024-01-11 LAB — INSULIN-LIKE GROWTH FACTOR: Insulin-Like GF-1: 107 ng/mL (ref 81–263)

## 2024-01-11 LAB — LUTEINIZING HORMONE: LH: 15 m[IU]/mL — ABNORMAL HIGH (ref 1.7–8.6)

## 2024-01-11 LAB — PROLACTIN: Prolactin: 4.3 ng/mL (ref 3.9–22.7)

## 2024-01-11 LAB — T3: T3, Total: 164 ng/dL (ref 71–180)

## 2024-01-11 LAB — FOLLICLE STIMULATING HORMONE: FSH: 18.4 m[IU]/mL — ABNORMAL HIGH (ref 1.5–12.4)

## 2024-01-11 LAB — T4, FREE: Free T4: 1.07 ng/dL (ref 0.82–1.77)

## 2024-01-15 ENCOUNTER — Other Ambulatory Visit: Payer: Self-pay | Admitting: Family Medicine

## 2024-01-19 ENCOUNTER — Other Ambulatory Visit: Payer: Self-pay | Admitting: Cardiology

## 2024-01-19 ENCOUNTER — Encounter: Payer: Self-pay | Admitting: *Deleted

## 2024-01-21 ENCOUNTER — Other Ambulatory Visit (HOSPITAL_COMMUNITY): Payer: Self-pay

## 2024-01-21 MED ORDER — GABAPENTIN 100 MG PO CAPS
100.0000 mg | ORAL_CAPSULE | Freq: Three times a day (TID) | ORAL | 0 refills | Status: AC | PRN
  Filled 2024-01-21: qty 180, 30d supply, fill #0

## 2024-01-21 MED ORDER — OXYCODONE HCL 10 MG PO TABS
10.0000 mg | ORAL_TABLET | Freq: Three times a day (TID) | ORAL | 0 refills | Status: DC | PRN
  Filled 2024-01-21: qty 180, 30d supply, fill #0

## 2024-01-23 ENCOUNTER — Other Ambulatory Visit: Payer: Self-pay

## 2024-01-23 ENCOUNTER — Other Ambulatory Visit (HOSPITAL_COMMUNITY): Payer: Self-pay

## 2024-02-01 ENCOUNTER — Other Ambulatory Visit: Payer: Self-pay | Admitting: Family Medicine

## 2024-02-09 ENCOUNTER — Other Ambulatory Visit (HOSPITAL_BASED_OUTPATIENT_CLINIC_OR_DEPARTMENT_OTHER): Payer: Self-pay

## 2024-02-21 ENCOUNTER — Other Ambulatory Visit: Payer: Self-pay | Admitting: Family Medicine

## 2024-02-21 ENCOUNTER — Other Ambulatory Visit (HOSPITAL_COMMUNITY): Payer: Self-pay

## 2024-02-21 ENCOUNTER — Other Ambulatory Visit: Payer: Self-pay | Admitting: Cardiology

## 2024-02-21 ENCOUNTER — Other Ambulatory Visit: Payer: Self-pay

## 2024-02-21 DIAGNOSIS — E1169 Type 2 diabetes mellitus with other specified complication: Secondary | ICD-10-CM

## 2024-02-21 MED ORDER — OXYCODONE HCL 10 MG PO TABS
10.0000 mg | ORAL_TABLET | Freq: Three times a day (TID) | ORAL | 0 refills | Status: AC | PRN
  Filled 2024-02-21 – 2024-02-23 (×2): qty 180, 30d supply, fill #0
  Filled ????-??-??: fill #0

## 2024-02-23 ENCOUNTER — Other Ambulatory Visit (HOSPITAL_COMMUNITY): Payer: Self-pay

## 2024-02-25 ENCOUNTER — Telehealth: Payer: Self-pay

## 2024-02-25 ENCOUNTER — Other Ambulatory Visit: Payer: Self-pay

## 2024-02-25 DIAGNOSIS — U071 COVID-19: Secondary | ICD-10-CM

## 2024-02-25 DIAGNOSIS — G4733 Obstructive sleep apnea (adult) (pediatric): Secondary | ICD-10-CM

## 2024-02-25 DIAGNOSIS — J9611 Chronic respiratory failure with hypoxia: Secondary | ICD-10-CM

## 2024-02-25 NOTE — Telephone Encounter (Signed)
 Pt wife Verla called to advise that they received the message from the nurse. Desiree stated PCP is aware the pt is on oxygen and that he has been on oxygen for at least 5 years. She would like the nurse to call back to discuss, [phone]

## 2024-02-25 NOTE — Telephone Encounter (Signed)
 Copied from CRM [id number]. Topic: Clinical - Order For Equipment >> Feb 25, 2024  1:12 PM Donna BRAVO wrote: Reason for CRM:  Need a new prescription is needed,  signed by Dr Duanne MD and need last clinical notes that contain a discussion regarding the use of oxygen   Fax prescription and last office visit notes that discuss oxygen with provider

## 2024-04-26 ENCOUNTER — Ambulatory Visit (HOSPITAL_COMMUNITY): Payer: Medicare (Managed Care)

## 2024-05-03 ENCOUNTER — Ambulatory Visit: Payer: Medicare (Managed Care) | Admitting: Cardiology

## 2024-05-14 ENCOUNTER — Ambulatory Visit: Payer: Medicare (Managed Care) | Admitting: "Endocrinology

## 2024-07-07 ENCOUNTER — Ambulatory Visit: Payer: Medicare (Managed Care)

## 2024-12-01 ENCOUNTER — Inpatient Hospital Stay: Payer: Medicare (Managed Care)

## 2024-12-08 ENCOUNTER — Inpatient Hospital Stay: Payer: Medicare (Managed Care) | Admitting: Physician Assistant
